# Patient Record
Sex: Female | Born: 1960 | Hispanic: Yes | Marital: Single | Smoking: Former smoker
Health system: Southern US, Community
[De-identification: ages and names within clinical notes are randomized; demographics above are authoritative.]

## PROBLEM LIST (undated history)

## (undated) DIAGNOSIS — I639 Cerebral infarction, unspecified: Secondary | ICD-10-CM

## (undated) DIAGNOSIS — K760 Fatty (change of) liver, not elsewhere classified: Secondary | ICD-10-CM

## (undated) DIAGNOSIS — M069 Rheumatoid arthritis, unspecified: Secondary | ICD-10-CM

## (undated) DIAGNOSIS — R51 Headache: Secondary | ICD-10-CM

## (undated) DIAGNOSIS — G4730 Sleep apnea, unspecified: Secondary | ICD-10-CM

## (undated) DIAGNOSIS — J189 Pneumonia, unspecified organism: Secondary | ICD-10-CM

## (undated) DIAGNOSIS — M1990 Unspecified osteoarthritis, unspecified site: Secondary | ICD-10-CM

## (undated) DIAGNOSIS — K219 Gastro-esophageal reflux disease without esophagitis: Secondary | ICD-10-CM

## (undated) DIAGNOSIS — Z8719 Personal history of other diseases of the digestive system: Secondary | ICD-10-CM

## (undated) DIAGNOSIS — E781 Pure hyperglyceridemia: Secondary | ICD-10-CM

## (undated) DIAGNOSIS — Z9221 Personal history of antineoplastic chemotherapy: Secondary | ICD-10-CM

## (undated) DIAGNOSIS — F419 Anxiety disorder, unspecified: Secondary | ICD-10-CM

## (undated) DIAGNOSIS — C9590 Leukemia, unspecified not having achieved remission: Secondary | ICD-10-CM

## (undated) DIAGNOSIS — R519 Headache, unspecified: Secondary | ICD-10-CM

## (undated) DIAGNOSIS — C9200 Acute myeloblastic leukemia, not having achieved remission: Secondary | ICD-10-CM

## (undated) DIAGNOSIS — F329 Major depressive disorder, single episode, unspecified: Secondary | ICD-10-CM

## (undated) DIAGNOSIS — F32A Depression, unspecified: Secondary | ICD-10-CM

## (undated) DIAGNOSIS — Z8669 Personal history of other diseases of the nervous system and sense organs: Secondary | ICD-10-CM

## (undated) DIAGNOSIS — M8580 Other specified disorders of bone density and structure, unspecified site: Secondary | ICD-10-CM

## (undated) DIAGNOSIS — K5900 Constipation, unspecified: Secondary | ICD-10-CM

## (undated) DIAGNOSIS — E119 Type 2 diabetes mellitus without complications: Secondary | ICD-10-CM

## (undated) DIAGNOSIS — D649 Anemia, unspecified: Secondary | ICD-10-CM

## (undated) HISTORY — DX: Sleep apnea, unspecified: G47.30

## (undated) HISTORY — DX: Acute myeloblastic leukemia, not having achieved remission: C92.00

## (undated) HISTORY — DX: Cerebral infarction, unspecified: I63.9

## (undated) HISTORY — PX: JOINT REPLACEMENT: SHX530

## (undated) HISTORY — DX: Constipation, unspecified: K59.00

## (undated) HISTORY — DX: Rheumatoid arthritis, unspecified: M06.9

## (undated) HISTORY — DX: Leukemia, unspecified not having achieved remission: C95.90

## (undated) HISTORY — DX: Other specified disorders of bone density and structure, unspecified site: M85.80

---

## 1994-07-15 HISTORY — PX: TUBAL LIGATION: SHX77

## 1998-07-15 DIAGNOSIS — Z9884 Bariatric surgery status: Secondary | ICD-10-CM | POA: Insufficient documentation

## 1998-07-15 HISTORY — PX: LAPAROSCOPIC GASTRIC BANDING: SHX1100

## 1999-07-16 HISTORY — PX: ABDOMINAL HYSTERECTOMY: SHX81

## 2005-03-14 ENCOUNTER — Ambulatory Visit: Payer: Self-pay | Admitting: Internal Medicine

## 2005-07-15 DIAGNOSIS — C9201 Acute myeloblastic leukemia, in remission: Secondary | ICD-10-CM | POA: Insufficient documentation

## 2005-07-15 DIAGNOSIS — C9200 Acute myeloblastic leukemia, not having achieved remission: Secondary | ICD-10-CM

## 2005-07-15 HISTORY — DX: Acute myeloblastic leukemia, not having achieved remission: C92.00

## 2006-08-07 ENCOUNTER — Ambulatory Visit: Payer: Self-pay | Admitting: Internal Medicine

## 2006-08-15 ENCOUNTER — Ambulatory Visit: Payer: Self-pay | Admitting: Internal Medicine

## 2010-12-07 DIAGNOSIS — Z8619 Personal history of other infectious and parasitic diseases: Secondary | ICD-10-CM | POA: Insufficient documentation

## 2011-09-30 DIAGNOSIS — J302 Other seasonal allergic rhinitis: Secondary | ICD-10-CM | POA: Insufficient documentation

## 2012-06-10 ENCOUNTER — Ambulatory Visit: Payer: Self-pay | Admitting: Family Medicine

## 2012-07-02 ENCOUNTER — Ambulatory Visit: Payer: Self-pay | Admitting: Family Medicine

## 2012-12-03 ENCOUNTER — Ambulatory Visit: Payer: Self-pay | Admitting: Family Medicine

## 2012-12-10 ENCOUNTER — Ambulatory Visit: Payer: Self-pay | Admitting: Family Medicine

## 2012-12-22 DIAGNOSIS — R590 Localized enlarged lymph nodes: Secondary | ICD-10-CM | POA: Insufficient documentation

## 2013-01-14 ENCOUNTER — Observation Stay: Payer: Self-pay | Admitting: Internal Medicine

## 2013-01-14 LAB — COMPREHENSIVE METABOLIC PANEL
Albumin: 3.8 g/dL (ref 3.4–5.0)
Alkaline Phosphatase: 100 U/L (ref 50–136)
BUN: 11 mg/dL (ref 7–18)
Calcium, Total: 9.2 mg/dL (ref 8.5–10.1)
Co2: 29 mmol/L (ref 21–32)
EGFR (African American): >60
EGFR (Non-African Amer.): >60
SGPT (ALT): 39 U/L (ref 12–78)
Total Protein: 7.7 g/dL (ref 6.4–8.2)

## 2013-01-14 LAB — CBC
HCT: 44.6 % (ref 35.0–47.0)
HGB: 15.2 g/dL (ref 12.0–16.0)
MCHC: 34.2 g/dL (ref 32.0–36.0)
MCV: 95 fL (ref 80–100)
Platelet: 221 10*3/uL (ref 150–440)
RBC: 4.68 10*6/uL (ref 3.80–5.20)

## 2013-01-15 DIAGNOSIS — I6789 Other cerebrovascular disease: Secondary | ICD-10-CM

## 2013-01-15 LAB — CBC WITH DIFFERENTIAL/PLATELET
Basophil #: 0 10*3/uL (ref 0.0–0.1)
Eosinophil %: 3.8 %
HCT: 43.5 % (ref 35.0–47.0)
HGB: 14.8 g/dL (ref 12.0–16.0)
Lymphocyte #: 3.6 10*3/uL (ref 1.0–3.6)
Lymphocyte %: 39.9 %
MCH: 32.6 pg (ref 26.0–34.0)
MCHC: 34 g/dL (ref 32.0–36.0)
MCV: 96 fL (ref 80–100)
Platelet: 202 10*3/uL (ref 150–440)
RBC: 4.54 10*6/uL (ref 3.80–5.20)
RDW: 14.3 % (ref 11.5–14.5)
WBC: 9.1 10*3/uL (ref 3.6–11.0)

## 2013-01-15 LAB — URINALYSIS, COMPLETE
Bilirubin,UR: NEGATIVE
Ketone: NEGATIVE
Nitrite: NEGATIVE
Ph: 5 (ref 4.5–8.0)
Protein: NEGATIVE
RBC,UR: 1 /HPF (ref 0–5)
Specific Gravity: 1.016 (ref 1.003–1.030)
Squamous Epithelial: 10

## 2013-01-15 LAB — LIPID PANEL
Cholesterol: 190 mg/dL (ref 0–200)
HDL Cholesterol: 44 mg/dL (ref 40–60)
Triglycerides: 307 mg/dL — ABNORMAL HIGH (ref 0–200)
VLDL Cholesterol, Calc: 61 mg/dL — ABNORMAL HIGH (ref 5–40)

## 2013-02-15 ENCOUNTER — Ambulatory Visit: Payer: Self-pay | Admitting: Family Medicine

## 2013-03-15 ENCOUNTER — Ambulatory Visit: Payer: Self-pay | Admitting: Family Medicine

## 2013-03-17 ENCOUNTER — Ambulatory Visit: Payer: Self-pay | Admitting: Neurology

## 2013-04-12 ENCOUNTER — Ambulatory Visit: Payer: Self-pay | Admitting: Neurology

## 2013-04-14 ENCOUNTER — Ambulatory Visit: Payer: Self-pay | Admitting: Family Medicine

## 2013-05-15 ENCOUNTER — Ambulatory Visit: Payer: Self-pay | Admitting: Family Medicine

## 2013-06-14 ENCOUNTER — Ambulatory Visit: Payer: Self-pay | Admitting: Family Medicine

## 2013-07-15 ENCOUNTER — Ambulatory Visit: Payer: Self-pay | Admitting: Family Medicine

## 2013-08-19 ENCOUNTER — Ambulatory Visit: Payer: Self-pay | Admitting: Family Medicine

## 2013-12-20 ENCOUNTER — Ambulatory Visit: Payer: Self-pay | Admitting: Family Medicine

## 2014-01-10 ENCOUNTER — Encounter (INDEPENDENT_AMBULATORY_CARE_PROVIDER_SITE_OTHER): Payer: Self-pay | Admitting: Surgery

## 2014-01-10 ENCOUNTER — Ambulatory Visit (INDEPENDENT_AMBULATORY_CARE_PROVIDER_SITE_OTHER): Payer: BC Managed Care – PPO | Admitting: Surgery

## 2014-01-10 VITALS — BP 125/75 | HR 67 | Temp 98.5°F | Resp 14 | Ht 62.0 in | Wt 252.8 lb

## 2014-01-10 DIAGNOSIS — Z6841 Body Mass Index (BMI) 40.0 and over, adult: Secondary | ICD-10-CM | POA: Insufficient documentation

## 2014-01-10 DIAGNOSIS — Z856 Personal history of leukemia: Secondary | ICD-10-CM

## 2014-01-10 DIAGNOSIS — Z9884 Bariatric surgery status: Secondary | ICD-10-CM

## 2014-01-10 DIAGNOSIS — G4733 Obstructive sleep apnea (adult) (pediatric): Secondary | ICD-10-CM

## 2014-01-10 MED ORDER — HYDROCODONE-ACETAMINOPHEN 5-325 MG PO TABS: 1.0000 | ORAL_TABLET | ORAL | Status: DC | PRN

## 2014-01-10 NOTE — Patient Instructions (Signed)
Laparoscopa gstrica y [place] , Cuidados posteriores (Laparoscopic Gastric Band Surgery, Care After)  Estas indicaciones le proporcionan informacin general acerca de cmo deber cuidarse despus del procedimiento. El mdico tambin podr darle ms informacin especfica. Comunquese con su mdico si despus del procedimiento tiene problemas o dudas. CUIDADOS EN EL HOGAR   Haga caminatas con frecuencia Agricultural consultant. No permanezca sentado durante ms de 1 hora mientras est despierto, durante las 4 a 6 semanas posteriores a la [place].  Podr darse Ardelia Mems ducha 2 das despus del procedimiento. [place] heridas (incisiones). No se frote las incisiones.  Haga ejercicios para la tos y Research officer, trade union.  No  levante, empuje o levante objetos pesados hasta que el mdico lo autorice.  Tome slo la medicacin que le indic el profesional. No conduzca vehculos mientras toma analgsicos.  Beba gran cantidad de lquido para mantener la orina de tono claro o color amarillo plido.  Siga una dieta de lquidos durante el tiempo indicado por el mdico.  No consuma cafena por 1 mes.  Cambie todos los apsitos (vendajes) segn las indicaciones del [place] heridas para observar enrojecimiento, inflamacin (hinchazn), color o secrecin anormal, o sangrado.  Siga las recomendaciones de su mdico acerca de los requerimientos de vitaminas y protenas despus de la [place]. SOLICITE AYUDA DE INMEDIATO SI:   Tiene malestar estomacal (nuseas) y vmitos.  Siente dolor y molestias al tragar.  Le falta el aire o tiene dificultad para respirar.  Siente dolor, inflamacin o calor en la zona inferior del cuerpo.  Siente dolor intenso en las pantorrillas o tiene algn dolor que no se calma con los medicamentos.  La temperatura oral le sube a ms de 38,9 C (102 F).  Observa que la incisin est roja, inflamada o segrega lquido.  La materia fecal (heces) son  de color negro o rojo oscuro, o de aspecto similar al alquitrn.  Comienza a sentir escalofros.  Siente dolor en el pecho.  Se siente sbitamente confundido.  Tiene dificultad para hablar.  Se siente mareado al pararse.  Sbitamente se siente dbil.  Tiene preguntas o preocupaciones. ASEGRESE DE QUE:   Comprende estas instrucciones.  Controlar su enfermedad.  Solicitar ayuda de inmediato si no mejora o empeora. Document Released: 10/16/2010 Document Revised: 10/26/2012 [facility] Patient Information 2015 [place], [place]. This information is not intended to replace advice given to you by your health care Gabriele Loveland. Make sure you discuss any questions you have with your health care Chinwe Lope.

## 2014-01-10 NOTE — Progress Notes (Addendum)
Chief Complaint:  Gastric band in [place] in 2000 not filled at present-daugther and her best friend here to help with interpretation.    History of Present Illness:  Stacey Nichols is an 53 y.o. female who had a gastric band done in the anterior [place] in 2000. This was an "[employer]" band and is apparently a [place] band.  About 7 years ago she was diagnosed with leukemia and treated at [facility] he at that time all of the fluid was removed from her band. Since then she's had a lot of problems with arthritis and has been unable to exercise and has gained weight. She saw her primary care doctor, Dr. Keith Rake in [place] who referred her.    On examination her port is in the left upper quadrant. She is having some referred pain in the left upper quadrant which may be been related. As the first [facility] we should get an upper GI series to look at her band position and see if we can use the band to try to help her lose weight. If not then she is inch did and considering a sleeve gastrectomy or Conversion to Roux-en-Y.  Past Medical History  Diagnosis Date  . Cancer     leukemia    Past Surgical History  Procedure Laterality Date  . Laparoscopic gastric banding  2000-2001    performed in [place]  . Tubal ligation  1996  . Abdominal hysterectomy  2001    Current Outpatient Prescriptions  Medication Sig Dispense Refill  . aspirin 81 MG tablet Take 81 mg by mouth daily.      . fexofenadine (ALLEGRA) 30 MG tablet Take 30 mg by mouth 2 (two) times daily.      . montelukast (SINGULAIR) 10 MG tablet       . naproxen sodium (ANAPROX) 550 MG tablet       . topiramate (TOPAMAX) 50 MG tablet        No current facility-administered medications for this visit.   Latex Family History  Problem Relation Age of Onset  . Heart disease Father    Social History:   reports that she quit smoking about a year ago. Her smoking use included Cigarettes. She smoked 0.00 packs per day. She does not have any smokeless  tobacco history on file. She reports that she drinks alcohol. She reports that she does not use illicit drugs.   REVIEW OF SYSTEMS : Positive for arthritic and OSA ; otherwise negative  Physical Exam:   Blood pressure 125/75, pulse 67, temperature 98.5 F (36.9 C), resp. rate 14, height 5\' 2"  (1.575 m), weight 252 lb 12.8 oz (114.669 kg). Body mass index is 46.23 kg/(m^2).  Gen:  WDWN hispanic female NAD  Neurological: Alert and oriented to person, place, and time. Motor and sensory function is grossly intact  Head: Normocephalic and atraumatic.  Eyes: Conjunctivae are normal. Pupils are equal, round, and reactive to light. No scleral icterus.  Neck: Normal range of motion. Neck supple. No tracheal deviation or thyromegaly present.  Cardiovascular:  SR without murmurs or gallops.  No carotid bruits Breast:  Not examined Respiratory: Effort normal.  No respiratory distress. No chest wall tenderness. Breath sounds normal.  No wheezes, rales or rhonchi.  Abdomen:  Obese with multiple port sites GU:  Not evaluated Musculoskeletal: Normal range of motion. Extremities are nontender. No cyanosis, edema or clubbing noted Lymphadenopathy: No cervical, preauricular, postauricular or axillary adenopathy is present Skin: Skin is warm and dry. No  rash noted. No diaphoresis. No erythema. No pallor. Pscyh: Normal mood and affect. Behavior is normal. Judgment and thought content normal.   LABORATORY RESULTS: No results found for this or any previous visit (from the past 48 hour(s)).   RADIOLOGY RESULTS: No results found.  Problem List: Patient Active Problem List   Diagnosis Date Noted  . History of leukemia 01/10/2014  . 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"  01/10/2014  . Morbid obesity-BMI 46 01/10/2014  . OSA (obstructive sleep apnea) 01/10/2014  . Arthritis-knees and ankles 01/10/2014    Assessment & Plan: Gastric band placed in [place] that is not filled by history.  Will get UGI to  assess position and restriction.  Will make recommendations from that study.      Matt B. Hassell Done, MD, [facility] [phone] beeper [phone]  01/10/2014 4:39 PM    Requested something for pain.  Norco prescribed.

## 2014-01-10 NOTE — Addendum Note (Signed)
Addended by: Johnathan Hausen B on: 01/10/2014 04:58 PM   Modules accepted: Orders

## 2014-01-17 ENCOUNTER — Ambulatory Visit
Admission: RE | Admit: 2014-01-17 | Discharge: 2014-01-17 | Disposition: A | Discharge status: Home / Self Care | Payer: BC Managed Care – PPO | Source: Ambulatory Visit | Attending: Surgery | Admitting: Surgery

## 2014-01-17 DIAGNOSIS — Z9884 Bariatric surgery status: Secondary | ICD-10-CM

## 2014-01-17 DIAGNOSIS — Z856 Personal history of leukemia: Secondary | ICD-10-CM

## 2014-01-20 ENCOUNTER — Encounter (INDEPENDENT_AMBULATORY_CARE_PROVIDER_SITE_OTHER): Payer: Self-pay | Admitting: Surgery

## 2014-01-20 ENCOUNTER — Ambulatory Visit (INDEPENDENT_AMBULATORY_CARE_PROVIDER_SITE_OTHER): Payer: BC Managed Care – PPO | Admitting: Surgery

## 2014-01-20 VITALS — BP 100/60 | HR 74 | Resp 16 | Ht 62.0 in | Wt 252.2 lb

## 2014-01-20 DIAGNOSIS — Z9884 Bariatric surgery status: Secondary | ICD-10-CM

## 2014-01-20 NOTE — Progress Notes (Signed)
Chief Complaint:  Weight regain after [place] band  History of Present Illness:  Stacey Nichols is an 53 y.o. female who underwent a Swedish band placement down in [place].  Despite working really hard with exercise and never achieved great weight loss. Subsequently she was diagnosed and treated successfully for leukemia. Since then issues with arthritis in her knees has limited her activity and she has never been able to have successful weight loss. I reviewed his CT scan from [facility] and the upper GI that order. It appears that her tubing has broken her band is detached. It is very difficult to see and I was only able to perceive the band on her CT scan.    After extensive discussions with her, her daughter, and her best friend who act as her translator we discussed in detail were move all of the band and conversion to a sleeve. I even offered to reconnect her band and see if we could have weight loss with that but said she tried very hard but that she had suboptimal results. She would like to have conversion to a sleeve gastrectomy. I described to doing this in either one operation or in 2 operations depending on the findings at the time of the removal of her band. She also has some issues with reflux and may have a hiatal hernia that will need to be repaired at the same time.  Past Medical History  Diagnosis Date  . Cancer     leukemia    Past Surgical History  Procedure Laterality Date  . Laparoscopic gastric banding  2000-2001    performed in [place]  . Tubal ligation  1996  . Abdominal hysterectomy  2001    Current Outpatient Prescriptions  Medication Sig Dispense Refill  . aspirin 81 MG tablet Take 81 mg by mouth daily.      . fexofenadine (ALLEGRA) 30 MG tablet Take 30 mg by mouth 2 (two) times daily.      Marland Kitchen HYDROcodone-acetaminophen (NORCO) 5-325 MG per tablet Take 1 tablet by mouth every 4 (four) hours as needed for moderate pain.  30 tablet  0  . montelukast (SINGULAIR)  10 MG tablet       . naproxen sodium (ANAPROX) 550 MG tablet       . topiramate (TOPAMAX) 50 MG tablet        No current facility-administered medications for this visit.   Latex Family History  Problem Relation Age of Onset  . Heart disease Father    Social History:   reports that she quit smoking about a year ago. Her smoking use included Cigarettes. She smoked 0.00 packs per Stacey. She does not have any smokeless tobacco history on file. She reports that she drinks alcohol. She reports that she does not use illicit drugs.   REVIEW OF SYSTEMS : Positive for heartburn ; otherwise negative  Physical Exam:   Blood pressure 100/60, pulse 74, resp. rate 16, height 5\' 2"  (1.575 m), weight 252 lb 3.2 oz (114.397 kg). Body mass index is 46.12 kg/(m^2).  Gen:  WDWN white female NAD  Neurological: Alert and oriented to person, place, and time. Motor and sensory function is grossly intact  Head: Normocephalic and atraumatic.  Eyes: Conjunctivae are normal. Pupils are equal, round, and reactive to light. No scleral icterus.  Neck: Normal range of motion. Neck supple. No tracheal deviation or thyromegaly present.  Cardiovascular:  SR without murmurs or gallops.  No carotid bruits Breast:  Not examined Respiratory:  Effort normal.  No respiratory distress. No chest wall tenderness. Breath sounds normal.  No wheezes, rales or rhonchi.  Abdomen:  Obese difficult to palpate the port in the left upper quadrant GU:  Not examined Musculoskeletal: Normal range of motion. Extremities are nontender. No cyanosis, edema or clubbing noted Lymphadenopathy: No cervical, preauricular, postauricular or axillary adenopathy is present Skin: Skin is warm and dry. No rash noted. No diaphoresis. No erythema. No pallor. Pscyh: Normal mood and affect. Behavior is normal. Judgment and thought content normal.   LABORATORY RESULTS: No results found for this or any previous visit (from the past 48 hour(s)).   RADIOLOGY  RESULTS: No results found.  Problem List: Patient Active Problem List   Diagnosis Date Noted  . History of leukemia 01/10/2014  . 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"  01/10/2014  . Morbid obesity-BMI 46 01/10/2014  . OSA (obstructive sleep apnea) 01/10/2014  . Arthritis-knees and ankles 01/10/2014    Assessment & Plan: History of band placed in [place] with now detached tubing and nonfunction but with a history of suboptimal weight loss. She desires a conversion to a sleeve gastrectomy.    Matt B. Hassell Done, MD, [facility] [phone] beeper [phone]  01/20/2014 2:23 PM

## 2014-01-20 NOTE — Addendum Note (Signed)
Addended by: Carlene Coria on: 01/20/2014 02:37 PM   Modules accepted: Orders

## 2014-01-20 NOTE — Patient Instructions (Signed)
Gastrectoma en manga  (Sleeve Gastrectomy) La gastrectoma en manga es una ciruga en la que se extirpa una porcin grande del [place]. Despus de la [place], el estmago ser un tubo estrecho del tamao aproximado de una banana. Esta ciruga se realiza para ayudar a la persona a Administrator, Civil Service. La persona pierde peso debido a que el tamao reducido del Paramedic restringe la cantidad de alimento que la persona puede comer. El Paramedic contendr una cantidad mucho menor de alimentos que la que poda contener antes de la [place]. Adems, la parte del estmago que se extirpa produce una hormona que causa hambre.  Esta ciruga se indica en las personas con obesidad mrbida, definida como el ndice de masa corporal [facility]) mayor a 40. El [facility] es la estimacin de la grasa corporal y se calcula a partir de la altura y el peso de una persona. Esta ciruga tambin est indicada en personas con un [facility] entre 12 y 13, si adems sufren otras enfermedades como diabetes mellitus tipo 2, apnea del sueo obstructiva o afecciones cardacas o pulmonares. (enfermedades cardiopulmonares).  INFORME A SU MDICO:   Cualquier alergia que tenga.   Todos los [employer] [place], incluyendo vitaminas, hierbas, gotas oftlmicas, cremas y medicamentos de venta libre.   Uso de corticoides (por va oral o cremas).   Problemas previos que usted o los [facility] de su familia hayan tenido con el uso de anestsicos.   Enfermedades de [employer].   Cirugas previas.   Posibilidad de embarazo, si correspondiera.   Otros problemas de [place]. RIESGOS Y COMPLICACIONES  En general, la gastrectoma en manga es un procedimiento seguro. Sin embargo, Games developer procedimiento, pueden surgir complicaciones. Las complicaciones posibles son:   Infeccin.  Hemorragias.  Cogulos sanguneos.  Lesiones en los rganos o tejidos circundantes.  Prdida de lquido del estmago hacia la cavidad abdominal (raro). ANTES DEL  PROCEDIMIENTO   Es posible que tenga que hacerse anlisis de [place], [place] de diagnstico por imgenes (como una radiografa o Magazine features editor) antes de la ciruga. Tambin es posible que le indiquen un estudio para Radio broadcast assistant esfago y como se mueve (manometra esofgica).  Le indicarn que siga una dieta lquida durante 2 a 3 semanas antes de la [place].  Consulte a su mdico si debe cambiar o suspender los medicamentos que toma habitualmente.  No coma ni beba nada durante al menos 8 horas antes del procedimiento   Haga arreglos para que alguien lo lleve a su casa despus la hospitalizacin. Tambin pdale a alguna persona que lo ayude con sus actividades mientras se recupera. PROCEDIMIENTO  Generalmente se utiliza la tcnica laparoscpica para este procedimiento:   Le administrarn medicamentos para hacerlo dormir durante el procedimiento (anestesia general). Este medicamento se aplica a travs de una va intravenosa (IV) que se coloca en una vena.  Cuando est dormido, le higienizarn y desinfectarn el abdomen.  Le practicarn varias incisiones pequeas en el abdomen.  El gran espacio dentro del abdomen se llena de aire para expandirlo. Esto le proporciona al cirujano mayor espacio para operar y le permite visualizar ms fcil sus rganos.  Se inserta un tubo delgado que ilumina y que tiene una pequea cmara en el extremo (laparoscopio) a Lawerance Cruel de una pequea incisin en el abdomen. La cmara del laparoscopio enva una imagen a una pantalla de televisin que se encuentra en el quirfano. De este modo, el mdico tendr Ardelia Mems buena visin del interior del abdomen.  A travs de las otras pequeas incisiones del abdomen  se insertan tubos huecos. A travs de estos tubos se coloca el instrumental necesario para los procedimientos.  El cirujano [place] grapas para dividir parte del estmago y [employer] extirpa a travs de una de las incisiones.  La parte del estmago que queda se refuerza con  puntos o pegamento quirrgico, o ambos, para Product/process development scientist prdidas de los contenidos del [place]. A travs de una de las incisiones se coloca un pequeo tubo (drenaje) para permitir que el lquido en exceso drene de la zona.  Las incisiones se cierran con puntos de sutura o grapas o pegamento. DESPUS DEL PROCEDIMIENTO   Ser controlado rigurosamente en el rea de recuperacin. Una vez que haya eliminado la anestesia, es probable que lo lleven a una habitacin comn del hospital.  Marin Comment administrarn medicamentos para Best boy y las nuseas.   Podr tener un drenaje en una de las incisiones del abdomen. Si se [place] un drenaje, lo dejarn en el lugar hasta que vuelva a su casa y lo quitarn en la prxima visita.   Lo estimularn a que camine varias veces por da. Esto ayuda a prevenir cogulos sanguneos.  Le indicarn que comience con una dieta lquida al da siguiente de la [place]. En algunos casos se indica un estudio para controlar que no haya prdidas antes de que pueda comer.  Deber hacer ejercicios de respiracin y [employer] que tendr que toser. Esto ayuda a prevenir las infecciones pulmonares despus de [place].  Ser necesario que Music therapist hospital [place].  Document Released: 03/03/2013 [facility] Patient Information 2015 [place]. This information is not intended to replace advice given to you by your health care provider. Make sure you discuss any questions you have with your health care provider. Sleeve Gastrectomy, Care After Refer to this sheet in the next few weeks. These instructions provide you with information on caring for yourself after your procedure. Your surgeon may also give you more specific instructions. Your treatment has been planned according to current medical practices, but problems sometimes occur. Call your surgeon if you have any problems or questions after your procedure. HOME CARE INSTRUCTIONS  Get plenty of rest, but  move around frequently for short periods or take short walks as directed by your surgeon. Increase your activities gradually.  Only take over-the-counter or prescription medicines as directed by your surgeon.  Keep incision areas clean and dry. Remove or change any bandages (dressings) only as directed by your surgeon. You may have skin adhesive strips or glue over the incision areas. Do not take the strips or the glue off. They will fall off on their own.  Check your incisions and surrounding areas daily for any redness, swelling, or drainage of fluid.  Take showers once your surgeon approves. Until then, only take sponge baths. Pat incisions dry. Do not rub incisions with a washcloth or towel. Do not take tub baths or go swimming until your surgeon approves. Do not put anything on your incision to clean it unless directed to do so by your surgeon.  Limit activities as directed by your surgeon. You will need to avoid strenuous activity, heavy lifting, and pushing or pulling things with your arms for several weeks. Do not lift anything heavier than 10 lb (4.5 kg).  Perform deep breathing exercises and coughing as directed by your surgeon. This helps prevent a lung infection.  Do not drive until your surgeon approves.  Follow all of the dietary instructions provided by your surgeon or  dietitian. You will receive specific instructions on the type, size, and timing of meals.      You may need to stay on a liquid diet for some time after the surgery.  Drink fluids frequently. You should drink 1 oz of fluid as often as you can.  Take vitamin, calcium, and protein supplements as directed by your surgeon.  If you have a drain from the incision area, make sure you:      Keep the area of the drain clean and dry.  Empty the drain and record the amount of fluid daily.  Talk with your surgeon about when you may return to work and about your exercise routine.   Keep all follow-up  appointments with your surgeon and dietitian. SEEK MEDICAL CARE IF:  Your pain is not controlled with medicine.  You have a fever.  You have shaking chills.  You notice any redness, skin irritation, swelling, or drainage of fluid (other than light red) in the incision area.  Your drain gets pulled out accidentally.   Your drain contains bright red blood, green fluid, or fluid that has a foul smell. SEEK IMMEDIATE MEDICAL CARE IF:  You have difficulty breathing.  You have severe calf pain. MAKE SURE YOU:  Understand these instructions.  Will watch your condition.  Will get help right away if you are not doing well or get worse. Document Released: 04/27/2009 Document Revised: 03/03/2013 Document Reviewed: 11/13/2012 [facility] Patient Information 2015 [place], [place]. This information is not intended to replace advice given to you by your health care provider. Make sure you discuss any questions you have with your health care provider.

## 2014-02-08 ENCOUNTER — Ambulatory Visit (HOSPITAL_COMMUNITY): Payer: BC Managed Care – PPO

## 2014-02-08 ENCOUNTER — Other Ambulatory Visit (HOSPITAL_COMMUNITY): Payer: BC Managed Care – PPO

## 2014-02-16 ENCOUNTER — Ambulatory Visit (HOSPITAL_COMMUNITY)
Admission: RE | Admit: 2014-02-16 | Discharge: 2014-02-16 | Disposition: A | Discharge status: Home / Self Care | Payer: BC Managed Care – PPO | Source: Ambulatory Visit | Attending: Surgery | Admitting: Surgery

## 2014-02-16 DIAGNOSIS — Z9884 Bariatric surgery status: Secondary | ICD-10-CM

## 2014-02-16 DIAGNOSIS — C9590 Leukemia, unspecified not having achieved remission: Secondary | ICD-10-CM | POA: Insufficient documentation

## 2014-02-16 DIAGNOSIS — M1710 Unilateral primary osteoarthritis, unspecified knee: Secondary | ICD-10-CM | POA: Insufficient documentation

## 2014-02-16 DIAGNOSIS — IMO0002 Reserved for concepts with insufficient information to code with codable children: Secondary | ICD-10-CM

## 2014-02-16 DIAGNOSIS — G4733 Obstructive sleep apnea (adult) (pediatric): Secondary | ICD-10-CM | POA: Insufficient documentation

## 2014-02-16 DIAGNOSIS — Z6841 Body Mass Index (BMI) 40.0 and over, adult: Secondary | ICD-10-CM | POA: Insufficient documentation

## 2014-03-01 ENCOUNTER — Encounter: Payer: BC Managed Care – PPO | Attending: Surgery | Admitting: Dietician

## 2014-03-01 ENCOUNTER — Encounter: Payer: Self-pay | Admitting: Dietician

## 2014-03-01 DIAGNOSIS — Z01818 Encounter for other preprocedural examination: Secondary | ICD-10-CM | POA: Diagnosis present

## 2014-03-01 DIAGNOSIS — Z6841 Body Mass Index (BMI) 40.0 and over, adult: Secondary | ICD-10-CM | POA: Insufficient documentation

## 2014-03-01 DIAGNOSIS — Z713 Dietary counseling and surveillance: Secondary | ICD-10-CM | POA: Diagnosis not present

## 2014-03-01 LAB — CBC
HEMATOCRIT: 45.7 % (ref 36.0–46.0)
Hemoglobin: 15.5 g/dL — ABNORMAL HIGH (ref 12.0–15.0)
MCH: 31.8 pg (ref 26.0–34.0)
MCHC: 33.9 g/dL (ref 30.0–36.0)
MCV: 93.6 fL (ref 78.0–100.0)
Platelets: 221 10*3/uL (ref 150–400)
RBC: 4.88 MIL/uL (ref 3.87–5.11)
RDW: 14.5 % (ref 11.5–15.5)
WBC: 7.6 10*3/uL (ref 4.0–10.5)

## 2014-03-01 LAB — COMPREHENSIVE METABOLIC PANEL
ALBUMIN: 4.4 g/dL (ref 3.5–5.2)
ALK PHOS: 84 U/L (ref 39–117)
ALT: 37 U/L — ABNORMAL HIGH (ref 0–35)
AST: 37 U/L (ref 0–37)
BUN: 16 mg/dL (ref 6–23)
CO2: 20 meq/L (ref 19–32)
Calcium: 9.3 mg/dL (ref 8.4–10.5)
Chloride: 106 mEq/L (ref 96–112)
Creat: 0.85 mg/dL (ref 0.50–1.10)
GLUCOSE: 97 mg/dL (ref 70–99)
POTASSIUM: 4.5 meq/L (ref 3.5–5.3)
Sodium: 139 mEq/L (ref 135–145)
Total Bilirubin: 0.4 mg/dL (ref 0.2–1.2)
Total Protein: 7.4 g/dL (ref 6.0–8.3)

## 2014-03-01 LAB — TSH: TSH: 3.163 u[IU]/mL (ref 0.350–4.500)

## 2014-03-01 LAB — HEMOGLOBIN A1C
Hgb A1c MFr Bld: 6.2 % — ABNORMAL HIGH (ref <5.7)
MEAN PLASMA GLUCOSE: 131 mg/dL — AB (ref <117)

## 2014-03-01 LAB — T4: T4 TOTAL: 7.5 ug/dL (ref 5.0–12.5)

## 2014-03-01 NOTE — Progress Notes (Signed)
  Pre-Op Assessment Visit:  Pre-Operative Sleeve Gastrectomy Surgery  Medical Nutrition Therapy:  Appt start time: 8338   End time:  1130.  Patient was seen on 03/01/2014 for Pre-Operative Sleeve Gastrectomy Nutrition Assessment. Assessment and letter of approval faxed to [facility] Bariatric Surgery Program coordinator on 03/01/2014.   Preferred Learning Style:   No preference indicated   Learning Readiness:   Ready  Handouts given during visit include:  Pre-Op Goals Bariatric Surgery Protein Shakes Yellow card (carbohydrate foods)  Teaching Method Utilized:  Visual Auditory Hands on  Barriers to learning/adherence to lifestyle change: none  Demonstrated degree of understanding via:  Teach Back   Patient to call the [facility] to enroll in Pre-Op and Post-Op Nutrition Education when surgery date is scheduled.

## 2014-03-01 NOTE — Patient Instructions (Signed)
Follow Pre-Op Goals Try Protein Shakes Call [facility] at [phone] when surgery is scheduled to have Pre-Op appointments.

## 2014-03-30 ENCOUNTER — Other Ambulatory Visit (INDEPENDENT_AMBULATORY_CARE_PROVIDER_SITE_OTHER): Payer: Self-pay | Admitting: Surgery

## 2014-03-30 MED ORDER — HYDROCODONE-ACETAMINOPHEN 5-325 MG PO TABS: 1.0000 | ORAL_TABLET | ORAL | Status: DC | PRN

## 2014-04-07 ENCOUNTER — Encounter: Payer: BC Managed Care – PPO | Attending: Surgery | Admitting: Dietician

## 2014-04-07 DIAGNOSIS — Z01818 Encounter for other preprocedural examination: Secondary | ICD-10-CM | POA: Insufficient documentation

## 2014-04-07 DIAGNOSIS — Z6841 Body Mass Index (BMI) 40.0 and over, adult: Secondary | ICD-10-CM | POA: Diagnosis not present

## 2014-04-07 DIAGNOSIS — Z713 Dietary counseling and surveillance: Secondary | ICD-10-CM | POA: Insufficient documentation

## 2014-04-07 NOTE — Progress Notes (Signed)
  Pre-Operative Nutrition Class:  Appt start time: 1050   End time:  1145  Patient was seen on 04/07/2014 for one-on-one Pre-Operative Bariatric Surgery Education at the [facility]. The session was translated by a [place] Language interpreter.  Surgery date: Gastric sleeve Surgery type: 04/26/2014 [facility] at [facility]: 255.5 lbs on 03/01/14 Weight today: 262 lbs  TANITA  BODY COMP RESULTS  04/07/14   BMI (kg/m^2) 47.9   Fat Mass (lbs) 132.5   Fat Free Mass (lbs) 129.5   Total Body Water (lbs) 95   Samples given per MNT protocol. Patient educated on appropriate usage: Premier protein shake (vanilla - qty 1) Lot #: [id number] Exp: 11/2014  Unjury protein powder (unflavored - qty 1) Lot #: [id number] Exp: 05/2015  Celebrate Vitamins Multivitamin (grape - qty 1) Lot #: [id number] Exp: 07/2014   The following the learning objectives were met by the patient during this course:  Identify Pre-Op Dietary Goals and will begin 2 weeks pre-operatively  Identify appropriate sources of fluids and proteins   State protein recommendations and appropriate sources pre and post-operatively  Identify Post-Operative Dietary Goals and will follow for 2 weeks post-operatively  Identify appropriate multivitamin and calcium sources  Describe the need for physical activity post-operatively and will follow MD recommendations  State when to call healthcare provider regarding medication questions or post-operative complications  Handouts given during class include:  Pre-Op Bariatric Surgery Diet Handout  Protein Shake Handout  Post-Op Bariatric Surgery Nutrition Handout  BELT Program Information Flyer  Support Group Information Flyer  [facility] Bariatric Supplements Price List  Follow-Up Plan: Patient will follow-up at [facility] 2 weeks post operatively for diet advancement per MD.

## 2014-04-11 ENCOUNTER — Ambulatory Visit: Payer: BC Managed Care – PPO

## 2014-04-13 ENCOUNTER — Encounter (HOSPITAL_COMMUNITY): Payer: Self-pay | Admitting: Pharmacy Technician

## 2014-04-13 NOTE — Progress Notes (Signed)
Please put orders in Epic surgery 04-26-14 pre op 04-19-14 Thanks

## 2014-04-18 NOTE — Progress Notes (Signed)
Need orders in EPIC.  Surgery on 04/26/14.  Proep on 04/19/2014 at 1100am.  Thank You.

## 2014-04-18 NOTE — Patient Instructions (Addendum)
Stacey Nichols  04/18/2014   Your procedure is scheduled on:  04/26/14    Report to [facility] Emergency Department.  Follow the Signs to [place] at  0730      am  Call this number if you have problems the morning of surgery: [phone]   Remember: please bring CPAP mask and tubing   Do not eat food or drink liquids after midnight.   Take these medicines the morning of surgery with A SIP OF WATER: allegra and singulair if needed, flonase and nasonex if needed, hydrocodone if needed   Do not wear jewelry, make-up or nail polish.  Do not wear lotions, powders, or perfumes. deodorant.  Do not shave 48 hours prior to surgery.   Do not bring valuables to the hospital.  Contacts, dentures or bridgework may not be worn into surgery.  Leave suitcase in the car. After surgery it may be brought to your room.  For patients admitted to the hospital, checkout time is 11:00 AM the day of discharge.    Before surgery, you can play an important role.  Because skin is not sterile, your skin needs to be as free of germs as possible.  You can reduce the number of germs on your skin by washing with CHG (chlorahexidine gluconate) soap before surgery.  CHG is an antiseptic cleaner which kills germs and bonds with the skin to continue killing germs even after washing. Please DO NOT use if you have an allergy to CHG or antibacterial soaps.  If your skin becomes reddened/irritated stop using the CHG and inform your nurse when you arrive at Short Stay. Do not shave (including legs and underarms) for at least 48 hours prior to the first CHG shower.  You may shave your face/neck. Please follow these instructions carefully:  1.  Shower with CHG Soap the night before surgery and the  morning of Surgery.  2.  If you choose to wash your hair, wash your hair first as usual with your  normal  shampoo.  3.  After you shampoo, rinse your hair and body thoroughly to remove the  shampoo.                            4.   Use CHG as you would any other liquid soap.  You can apply chg directly  to the skin and wash                       Gently with a scrungie or clean washcloth.  5.  Apply the CHG Soap to your body ONLY FROM THE NECK DOWN.   Do not use on face/ open                           Wound or open sores. Avoid contact with eyes, ears mouth and genitals (private parts).                       Wash face,  Genitals (private parts) with your normal soap.             6.  Wash thoroughly, paying special attention to the area where your surgery  will be performed.  7.  Thoroughly rinse your body with warm water from the neck down.  8.  DO NOT shower/wash with your normal soap after using and rinsing off  the CHG Soap.                9.  Pat yourself dry with a clean towel.            10.  Wear clean pajamas.            11.  Place clean sheets on your bed the night of your first shower and do not  sleep with pets. Day of Surgery : Do not apply any lotions/deodorants the morning of surgery.  Please wear clean clothes to the hospital/surgery center.  FAILURE TO FOLLOW THESE INSTRUCTIONS MAY RESULT IN THE CANCELLATION OF YOUR SURGERY PATIENT SIGNATURE_________________________________  NURSE SIGNATURE__________________________________  ________________________________________________________________________

## 2014-04-19 ENCOUNTER — Encounter (HOSPITAL_COMMUNITY)
Admission: RE | Admit: 2014-04-19 | Discharge: 2014-04-19 | Disposition: A | Discharge status: Home / Self Care | Payer: BC Managed Care – PPO | Source: Ambulatory Visit | Attending: Surgery | Admitting: Surgery

## 2014-04-19 ENCOUNTER — Encounter (HOSPITAL_COMMUNITY): Payer: Self-pay

## 2014-04-19 DIAGNOSIS — E781 Pure hyperglyceridemia: Secondary | ICD-10-CM | POA: Diagnosis not present

## 2014-04-19 DIAGNOSIS — Z8673 Personal history of transient ischemic attack (TIA), and cerebral infarction without residual deficits: Secondary | ICD-10-CM | POA: Diagnosis not present

## 2014-04-19 DIAGNOSIS — M1389 Other specified arthritis, multiple sites: Secondary | ICD-10-CM | POA: Diagnosis not present

## 2014-04-19 DIAGNOSIS — G4733 Obstructive sleep apnea (adult) (pediatric): Secondary | ICD-10-CM | POA: Diagnosis not present

## 2014-04-19 DIAGNOSIS — F329 Major depressive disorder, single episode, unspecified: Secondary | ICD-10-CM | POA: Diagnosis not present

## 2014-04-19 DIAGNOSIS — F419 Anxiety disorder, unspecified: Secondary | ICD-10-CM | POA: Diagnosis not present

## 2014-04-19 DIAGNOSIS — Z0000 Encounter for general adult medical examination without abnormal findings: Secondary | ICD-10-CM | POA: Insufficient documentation

## 2014-04-19 DIAGNOSIS — K219 Gastro-esophageal reflux disease without esophagitis: Secondary | ICD-10-CM | POA: Insufficient documentation

## 2014-04-19 DIAGNOSIS — Z91040 Latex allergy status: Secondary | ICD-10-CM | POA: Diagnosis not present

## 2014-04-19 DIAGNOSIS — Z6841 Body Mass Index (BMI) 40.0 and over, adult: Secondary | ICD-10-CM | POA: Insufficient documentation

## 2014-04-19 DIAGNOSIS — C9590 Leukemia, unspecified not having achieved remission: Secondary | ICD-10-CM | POA: Diagnosis not present

## 2014-04-19 HISTORY — DX: Anxiety disorder, unspecified: F41.9

## 2014-04-19 HISTORY — DX: Anemia, unspecified: D64.9

## 2014-04-19 HISTORY — DX: Personal history of other diseases of the digestive system: Z87.19

## 2014-04-19 HISTORY — DX: Personal history of antineoplastic chemotherapy: Z92.21

## 2014-04-19 HISTORY — DX: Unspecified osteoarthritis, unspecified site: M19.90

## 2014-04-19 HISTORY — DX: Personal history of other diseases of the nervous system and sense organs: Z86.69

## 2014-04-19 HISTORY — DX: Pure hyperglyceridemia: E78.1

## 2014-04-19 HISTORY — DX: Gastro-esophageal reflux disease without esophagitis: K21.9

## 2014-04-19 HISTORY — DX: Headache, unspecified: R51.9

## 2014-04-19 HISTORY — DX: Depression, unspecified: F32.A

## 2014-04-19 HISTORY — DX: Major depressive disorder, single episode, unspecified: F32.9

## 2014-04-19 HISTORY — DX: Pneumonia, unspecified organism: J18.9

## 2014-04-19 HISTORY — DX: Headache: R51

## 2014-04-19 LAB — CBC
HCT: 45.3 % (ref 36.0–46.0)
HEMOGLOBIN: 14.8 g/dL (ref 12.0–15.0)
MCH: 31.9 pg (ref 26.0–34.0)
MCHC: 32.7 g/dL (ref 30.0–36.0)
MCV: 97.6 fL (ref 78.0–100.0)
Platelets: 209 10*3/uL (ref 150–400)
RBC: 4.64 MIL/uL (ref 3.87–5.11)
RDW: 13.8 % (ref 11.5–15.5)
WBC: 7.2 10*3/uL (ref 4.0–10.5)

## 2014-04-19 LAB — BASIC METABOLIC PANEL
Anion gap: 12 (ref 5–15)
BUN: 18 mg/dL (ref 6–23)
CO2: 26 mEq/L (ref 19–32)
Calcium: 9.8 mg/dL (ref 8.4–10.5)
Chloride: 102 mEq/L (ref 96–112)
Creatinine, Ser: 0.81 mg/dL (ref 0.50–1.10)
GFR calc Af Amer: >90 mL/min (ref >90)
GFR, EST NON AFRICAN AMERICAN: 82 mL/min — AB (ref >90)
GLUCOSE: 103 mg/dL — AB (ref 70–99)
Potassium: 4.4 mEq/L (ref 3.7–5.3)
Sodium: 140 mEq/L (ref 137–147)

## 2014-04-19 NOTE — Progress Notes (Signed)
Chest x-ray 02/16/14 on EPIC, EKG 02/16/14 on EPIC

## 2014-04-25 NOTE — Progress Notes (Signed)
Called Triage desk at [facility] to request orders for patient's 04/26/14 surgery.

## 2014-04-26 ENCOUNTER — Ambulatory Visit (INDEPENDENT_AMBULATORY_CARE_PROVIDER_SITE_OTHER): Payer: Self-pay | Admitting: Surgery

## 2014-04-26 ENCOUNTER — Inpatient Hospital Stay (HOSPITAL_COMMUNITY)
Admission: RE | Admit: 2014-04-26 | Discharge: 2014-04-29 | DRG: 620 | Disposition: A | Discharge status: Home / Self Care | Payer: BC Managed Care – PPO | Source: Ambulatory Visit | Attending: Surgery | Admitting: Surgery

## 2014-04-26 ENCOUNTER — Encounter (HOSPITAL_COMMUNITY): Payer: Self-pay | Admitting: *Deleted

## 2014-04-26 ENCOUNTER — Inpatient Hospital Stay (HOSPITAL_COMMUNITY): Payer: BC Managed Care – PPO | Admitting: Anesthesiology

## 2014-04-26 ENCOUNTER — Encounter (HOSPITAL_COMMUNITY): Payer: BC Managed Care – PPO | Admitting: Anesthesiology

## 2014-04-26 ENCOUNTER — Encounter (HOSPITAL_COMMUNITY)
Admission: RE | Disposition: A | Discharge status: Home / Self Care | Payer: Self-pay | Source: Ambulatory Visit | Attending: Surgery

## 2014-04-26 DIAGNOSIS — Z90710 Acquired absence of both cervix and uterus: Secondary | ICD-10-CM

## 2014-04-26 DIAGNOSIS — Z79891 Long term (current) use of opiate analgesic: Secondary | ICD-10-CM

## 2014-04-26 DIAGNOSIS — Z791 Long term (current) use of non-steroidal anti-inflammatories (NSAID): Secondary | ICD-10-CM | POA: Diagnosis not present

## 2014-04-26 DIAGNOSIS — G4733 Obstructive sleep apnea (adult) (pediatric): Secondary | ICD-10-CM | POA: Diagnosis present

## 2014-04-26 DIAGNOSIS — K9509 Other complications of gastric band procedure: Secondary | ICD-10-CM | POA: Diagnosis present

## 2014-04-26 DIAGNOSIS — Z6841 Body Mass Index (BMI) 40.0 and over, adult: Secondary | ICD-10-CM | POA: Diagnosis not present

## 2014-04-26 DIAGNOSIS — Z9884 Bariatric surgery status: Secondary | ICD-10-CM

## 2014-04-26 DIAGNOSIS — Z7982 Long term (current) use of aspirin: Secondary | ICD-10-CM

## 2014-04-26 DIAGNOSIS — Z87891 Personal history of nicotine dependence: Secondary | ICD-10-CM

## 2014-04-26 DIAGNOSIS — Z8249 Family history of ischemic heart disease and other diseases of the circulatory system: Secondary | ICD-10-CM | POA: Diagnosis not present

## 2014-04-26 DIAGNOSIS — Z01812 Encounter for preprocedural laboratory examination: Secondary | ICD-10-CM | POA: Diagnosis not present

## 2014-04-26 DIAGNOSIS — Z856 Personal history of leukemia: Secondary | ICD-10-CM | POA: Diagnosis not present

## 2014-04-26 DIAGNOSIS — K219 Gastro-esophageal reflux disease without esophagitis: Secondary | ICD-10-CM | POA: Diagnosis present

## 2014-04-26 DIAGNOSIS — M170 Bilateral primary osteoarthritis of knee: Secondary | ICD-10-CM | POA: Diagnosis present

## 2014-04-26 DIAGNOSIS — Y848 Other medical procedures as the cause of abnormal reaction of the patient, or of later complication, without mention of misadventure at the time of the procedure: Secondary | ICD-10-CM | POA: Diagnosis present

## 2014-04-26 DIAGNOSIS — Z79899 Other long term (current) drug therapy: Secondary | ICD-10-CM | POA: Diagnosis not present

## 2014-04-26 HISTORY — PX: UPPER GI ENDOSCOPY: SHX6162

## 2014-04-26 HISTORY — PX: LAPAROSCOPIC GASTRIC BAND REMOVAL WITH LAPAROSCOPIC GASTRIC SLEEVE RESECTION: SHX6498

## 2014-04-26 LAB — CBC
HEMATOCRIT: 40.6 % (ref 36.0–46.0)
HEMOGLOBIN: 13.7 g/dL (ref 12.0–15.0)
MCH: 32.5 pg (ref 26.0–34.0)
MCHC: 33.7 g/dL (ref 30.0–36.0)
MCV: 96.2 fL (ref 78.0–100.0)
Platelets: 170 10*3/uL (ref 150–400)
RBC: 4.22 MIL/uL (ref 3.87–5.11)
RDW: 13.7 % (ref 11.5–15.5)
WBC: 12.6 10*3/uL — ABNORMAL HIGH (ref 4.0–10.5)

## 2014-04-26 LAB — CREATININE, SERUM
Creatinine, Ser: 0.69 mg/dL (ref 0.50–1.10)
GFR calc Af Amer: >90 mL/min (ref >90)
GFR calc non Af Amer: >90 mL/min (ref >90)

## 2014-04-26 LAB — HEMOGLOBIN AND HEMATOCRIT, BLOOD
HCT: 39.7 % (ref 36.0–46.0)
Hemoglobin: 13.5 g/dL (ref 12.0–15.0)

## 2014-04-26 SURGERY — LAPAROSCOPIC GASTRIC BAND REMOVAL WITH LAPAROSCOPIC GASTRIC SLEEVE RESECTION
Anesthesia: General | Site: Abdomen

## 2014-04-26 MED ORDER — EPHEDRINE SULFATE 50 MG/ML IJ SOLN
INTRAMUSCULAR | Status: DC | PRN
  Administered 2014-04-26: 10 mg via INTRAVENOUS

## 2014-04-26 MED ORDER — PROMETHAZINE HCL 25 MG/ML IJ SOLN
INTRAMUSCULAR | Status: AC
  Filled 2014-04-26: qty 1

## 2014-04-26 MED ORDER — MIDAZOLAM HCL 5 MG/5ML IJ SOLN
INTRAMUSCULAR | Status: DC | PRN
  Administered 2014-04-26: 2 mg via INTRAVENOUS

## 2014-04-26 MED ORDER — LIDOCAINE HCL (CARDIAC) 20 MG/ML IV SOLN
INTRAVENOUS | Status: AC
  Filled 2014-04-26: qty 5

## 2014-04-26 MED ORDER — ONDANSETRON HCL 4 MG/2ML IJ SOLN
4.0000 mg | INTRAMUSCULAR | Status: DC | PRN
Start: 2014-04-26 — End: 2014-04-29
  Administered 2014-04-26 – 2014-04-27 (×4): 4 mg via INTRAVENOUS
  Filled 2014-04-26 (×4): qty 2

## 2014-04-26 MED ORDER — NEOSTIGMINE METHYLSULFATE 10 MG/10ML IV SOLN
INTRAVENOUS | Status: DC | PRN
  Administered 2014-04-26: 4 mg via INTRAVENOUS

## 2014-04-26 MED ORDER — FENTANYL CITRATE 0.05 MG/ML IJ SOLN
INTRAMUSCULAR | Status: AC
  Filled 2014-04-26: qty 5

## 2014-04-26 MED ORDER — DEXTROSE 5 % IV SOLN
2.0000 g | INTRAVENOUS | Status: AC
  Administered 2014-04-26: 2 g via INTRAVENOUS

## 2014-04-26 MED ORDER — PROPOFOL 10 MG/ML IV BOLUS
INTRAVENOUS | Status: AC
Start: 2014-04-26 — End: 2014-04-26
  Filled 2014-04-26: qty 20

## 2014-04-26 MED ORDER — DEXAMETHASONE SODIUM PHOSPHATE 10 MG/ML IJ SOLN
INTRAMUSCULAR | Status: AC
Start: 2014-04-26 — End: 2014-04-26
  Filled 2014-04-26: qty 1

## 2014-04-26 MED ORDER — CEFOXITIN SODIUM 2 G IV SOLR
INTRAVENOUS | Status: AC
  Filled 2014-04-26: qty 2

## 2014-04-26 MED ORDER — UNJURY CHICKEN SOUP POWDER: 2.0000 [oz_av] | Freq: Four times a day (QID) | ORAL | Status: DC

## 2014-04-26 MED ORDER — NEOSTIGMINE METHYLSULFATE 10 MG/10ML IV SOLN
INTRAVENOUS | Status: AC
Start: 2014-04-26 — End: 2014-04-26
  Filled 2014-04-26: qty 1

## 2014-04-26 MED ORDER — ONDANSETRON HCL 4 MG/2ML IJ SOLN
INTRAMUSCULAR | Status: AC
  Filled 2014-04-26: qty 2

## 2014-04-26 MED ORDER — UNJURY VANILLA POWDER
2.0000 [oz_av] | Freq: Four times a day (QID) | ORAL | Status: DC
  Administered 2014-04-28 – 2014-04-29 (×5): 2 [oz_av] via ORAL

## 2014-04-26 MED ORDER — FENTANYL CITRATE 0.05 MG/ML IJ SOLN
INTRAMUSCULAR | Status: DC | PRN
  Administered 2014-04-26 (×2): 100 ug via INTRAVENOUS
  Administered 2014-04-26: 50 ug via INTRAVENOUS

## 2014-04-26 MED ORDER — HEPARIN SODIUM (PORCINE) 5000 UNIT/ML IJ SOLN
5000.0000 [IU] | Freq: Three times a day (TID) | INTRAMUSCULAR | Status: DC
  Administered 2014-04-26 – 2014-04-29 (×8): 5000 [IU] via SUBCUTANEOUS
  Filled 2014-04-26 (×11): qty 1

## 2014-04-26 MED ORDER — GLYCOPYRROLATE 0.2 MG/ML IJ SOLN
INTRAMUSCULAR | Status: AC
  Filled 2014-04-26: qty 3

## 2014-04-26 MED ORDER — ONDANSETRON HCL 4 MG/2ML IJ SOLN
INTRAMUSCULAR | Status: DC | PRN
  Administered 2014-04-26: 4 mg via INTRAVENOUS

## 2014-04-26 MED ORDER — PROPOFOL 10 MG/ML IV BOLUS
INTRAVENOUS | Status: DC | PRN
  Administered 2014-04-26: 200 mg via INTRAVENOUS

## 2014-04-26 MED ORDER — LIDOCAINE HCL (CARDIAC) 20 MG/ML IV SOLN
INTRAVENOUS | Status: DC | PRN
  Administered 2014-04-26: 60 mg via INTRAVENOUS

## 2014-04-26 MED ORDER — HEPARIN SODIUM (PORCINE) 5000 UNIT/ML IJ SOLN
5000.0000 [IU] | INTRAMUSCULAR | Status: AC
  Administered 2014-04-26: 5000 [IU] via SUBCUTANEOUS
  Filled 2014-04-26: qty 1

## 2014-04-26 MED ORDER — ACETAMINOPHEN 160 MG/5ML PO SOLN: 650.0000 mg | ORAL | Status: DC | PRN

## 2014-04-26 MED ORDER — GLYCOPYRROLATE 0.2 MG/ML IJ SOLN
INTRAMUSCULAR | Status: DC | PRN
  Administered 2014-04-26: 0.6 mg via INTRAVENOUS

## 2014-04-26 MED ORDER — ROCURONIUM BROMIDE 100 MG/10ML IV SOLN
INTRAVENOUS | Status: DC | PRN
  Administered 2014-04-26: 50 mg via INTRAVENOUS
  Administered 2014-04-26: 10 mg via INTRAVENOUS
  Administered 2014-04-26: 20 mg via INTRAVENOUS

## 2014-04-26 MED ORDER — OXYCODONE HCL 5 MG/5ML PO SOLN
5.0000 mg | ORAL | Status: DC | PRN
  Administered 2014-04-28: 5 mg via ORAL
  Filled 2014-04-26: qty 5

## 2014-04-26 MED ORDER — ROCURONIUM BROMIDE 100 MG/10ML IV SOLN
INTRAVENOUS | Status: AC
  Filled 2014-04-26: qty 1

## 2014-04-26 MED ORDER — LACTATED RINGERS IV SOLN
INTRAVENOUS | Status: DC
  Administered 2014-04-26: 14:00:00 via INTRAVENOUS
  Administered 2014-04-26: 1000 mL via INTRAVENOUS

## 2014-04-26 MED ORDER — MIDAZOLAM HCL 2 MG/2ML IJ SOLN
INTRAMUSCULAR | Status: AC
  Filled 2014-04-26: qty 2

## 2014-04-26 MED ORDER — ACETAMINOPHEN 160 MG/5ML PO SOLN: 325.0000 mg | ORAL | Status: DC | PRN

## 2014-04-26 MED ORDER — HYDROMORPHONE HCL 2 MG/ML IJ SOLN
INTRAMUSCULAR | Status: AC
Start: 2014-04-26 — End: 2014-04-26
  Filled 2014-04-26: qty 1

## 2014-04-26 MED ORDER — MORPHINE SULFATE 2 MG/ML IJ SOLN
2.0000 mg | INTRAMUSCULAR | Status: DC | PRN
  Administered 2014-04-26 (×4): 4 mg via INTRAVENOUS
  Administered 2014-04-27 (×2): 2 mg via INTRAVENOUS
  Administered 2014-04-27: 4 mg via INTRAVENOUS
  Filled 2014-04-26 (×2): qty 2
  Filled 2014-04-26: qty 1
  Filled 2014-04-26 (×3): qty 2
  Filled 2014-04-26: qty 1
  Filled 2014-04-26: qty 2

## 2014-04-26 MED ORDER — HYDROMORPHONE HCL 1 MG/ML IJ SOLN
INTRAMUSCULAR | Status: DC | PRN
  Administered 2014-04-26: 1 mg via INTRAVENOUS
  Administered 2014-04-26: 0.5 mg via INTRAVENOUS

## 2014-04-26 MED ORDER — UNJURY CHOCOLATE CLASSIC POWDER: 2.0000 [oz_av] | Freq: Four times a day (QID) | ORAL | Status: DC

## 2014-04-26 MED ORDER — PROMETHAZINE HCL 25 MG/ML IJ SOLN
6.2500 mg | INTRAMUSCULAR | Status: AC | PRN
  Administered 2014-04-26 (×2): 6.25 mg via INTRAVENOUS

## 2014-04-26 MED ORDER — KCL IN DEXTROSE-NACL 20-5-0.45 MEQ/L-%-% IV SOLN
INTRAVENOUS | Status: DC
  Administered 2014-04-27: 100 mL via INTRAVENOUS
  Administered 2014-04-27: 17:00:00 via INTRAVENOUS
  Administered 2014-04-28: 100 mL via INTRAVENOUS
  Administered 2014-04-28 – 2014-04-29 (×2): via INTRAVENOUS
  Filled 2014-04-26 (×8): qty 1000

## 2014-04-26 MED ORDER — LACTATED RINGERS IV SOLN
INTRAVENOUS | Status: DC | PRN
  Administered 2014-04-26 (×2): via INTRAVENOUS

## 2014-04-26 MED ORDER — LACTATED RINGERS IR SOLN
Status: DC | PRN
  Administered 2014-04-26: 1000 mL

## 2014-04-26 MED ORDER — HYDROMORPHONE HCL 1 MG/ML IJ SOLN: 0.2500 mg | INTRAMUSCULAR | Status: DC | PRN

## 2014-04-26 MED ORDER — DEXAMETHASONE SODIUM PHOSPHATE 10 MG/ML IJ SOLN
INTRAMUSCULAR | Status: DC | PRN
  Administered 2014-04-26: 10 mg via INTRAVENOUS

## 2014-04-26 MED ORDER — BUPIVACAINE LIPOSOME 1.3 % IJ SUSP
20.0000 mL | Freq: Once | INTRAMUSCULAR | Status: AC
  Administered 2014-04-26: 20 mL
  Filled 2014-04-26: qty 20

## 2014-04-26 MED ORDER — PHENYLEPHRINE 40 MCG/ML (10ML) SYRINGE FOR IV PUSH (FOR BLOOD PRESSURE SUPPORT)
PREFILLED_SYRINGE | INTRAVENOUS | Status: AC
  Filled 2014-04-26: qty 10

## 2014-04-26 SURGICAL SUPPLY — 64 items
APPLICATOR COTTON TIP 6IN STRL (MISCELLANEOUS) IMPLANT
APPLIER CLIP ROT 10 11.4 M/L (STAPLE)
APPLIER CLIP ROT 13.4 12 LRG (CLIP)
BLADE HEX COATED 2.75 (ELECTRODE) ×2 IMPLANT
BLADE SURG 15 STRL LF DISP TIS (BLADE) ×1 IMPLANT
BLADE SURG 15 STRL SS (BLADE) ×1
CABLE HIGH FREQUENCY MONO STRZ (ELECTRODE) ×2 IMPLANT
CLIP APPLIE ROT 10 11.4 M/L (STAPLE) IMPLANT
CLIP APPLIE ROT 13.4 12 LRG (CLIP) IMPLANT
DERMABOND ADVANCED (GAUZE/BANDAGES/DRESSINGS) ×1
DERMABOND ADVANCED .7 DNX12 (GAUZE/BANDAGES/DRESSINGS) ×1 IMPLANT
DEVICE SUT QUICK LOAD TK 5 (STAPLE) IMPLANT
DEVICE SUT TI-KNOT TK 5X26 (MISCELLANEOUS) IMPLANT
DEVICE SUTURE ENDOST 10MM (ENDOMECHANICALS) IMPLANT
DEVICE TROCAR PUNCTURE CLOSURE (ENDOMECHANICALS) ×2 IMPLANT
DRAIN CHANNEL 19F RND (DRAIN) IMPLANT
DRAPE CAMERA CLOSED 9X96 (DRAPES) ×2 IMPLANT
DRAPE UNIVERSAL PACK (DRAPES) ×2 IMPLANT
ELECT REM PT RETURN 9FT ADLT (ELECTROSURGICAL) ×2
ELECTRODE REM PT RTRN 9FT ADLT (ELECTROSURGICAL) ×1 IMPLANT
EVACUATOR SILICONE 100CC (DRAIN) IMPLANT
GAUZE SPONGE 4X4 12PLY STRL (GAUZE/BANDAGES/DRESSINGS) IMPLANT
GLOVE BIOGEL M 8.0 STRL (GLOVE) ×2 IMPLANT
GOWN SPEC L4 XLG W/TWL (GOWN DISPOSABLE) ×2 IMPLANT
GOWN STRL REUS W/TWL XL LVL3 (GOWN DISPOSABLE) ×6 IMPLANT
HANDLE STAPLE EGIA 4 XL (STAPLE) ×2 IMPLANT
HOVERMATT SINGLE USE (MISCELLANEOUS) ×2 IMPLANT
KIT BASIN OR (CUSTOM PROCEDURE TRAY) ×2 IMPLANT
NEEDLE SPNL 22GX3.5 QUINCKE BK (NEEDLE) ×2 IMPLANT
PENCIL BUTTON HOLSTER BLD 10FT (ELECTRODE) ×2 IMPLANT
RELOAD ENDO STITCH (ENDOMECHANICALS) IMPLANT
RELOAD STAPLER BLUE 60MM (STAPLE) ×2 IMPLANT
RELOAD STAPLER GOLD 60MM (STAPLE) IMPLANT
RELOAD STAPLER GREEN 60MM (STAPLE) ×1 IMPLANT
RELOAD TRI 45 ART MED THCK BLK (STAPLE) ×4 IMPLANT
RELOAD TRI 60 ART MED THCK BLK (STAPLE) ×6 IMPLANT
SCISSORS LAP 5X45 EPIX DISP (ENDOMECHANICALS) ×2 IMPLANT
SCRUB PCMX 4 OZ (MISCELLANEOUS) ×4 IMPLANT
SEALANT SURGICAL APPL DUAL CAN (MISCELLANEOUS) IMPLANT
SET IRRIG TUBING LAPAROSCOPIC (IRRIGATION / IRRIGATOR) ×2 IMPLANT
SHEARS CURVED HARMONIC AC 45CM (MISCELLANEOUS) ×2 IMPLANT
SLEEVE ADV FIXATION 12X100MM (TROCAR) ×2 IMPLANT
SLEEVE ADV FIXATION 5X100MM (TROCAR) ×4 IMPLANT
SLEEVE GASTRECTOMY 36FR VISIGI (MISCELLANEOUS) ×2 IMPLANT
SOLUTION ANTI FOG 6CC (MISCELLANEOUS) ×2 IMPLANT
SPONGE LAP 18X18 X RAY DECT (DISPOSABLE) ×2 IMPLANT
STAPLE ECHEON FLEX 60 POW ENDO (STAPLE) IMPLANT
STAPLER RELOAD BLUE 60MM (STAPLE) ×4
STAPLER RELOAD GOLD 60MM (STAPLE)
STAPLER RELOAD GREEN 60MM (STAPLE) ×2
SUT ETHILON 2 0 PS N (SUTURE) IMPLANT
SUT VIC AB 4-0 SH 18 (SUTURE) ×2 IMPLANT
SYR 20CC LL (SYRINGE) ×2 IMPLANT
SYR 50ML LL SCALE MARK (SYRINGE) ×2 IMPLANT
TOWEL OR 17X26 10 PK STRL BLUE (TOWEL DISPOSABLE) ×4 IMPLANT
TOWEL OR NON WOVEN STRL DISP B (DISPOSABLE) ×2 IMPLANT
TRAY FOLEY CATH 14FRSI W/METER (CATHETERS) ×2 IMPLANT
TROCAR ADV FIXATION 12X100MM (TROCAR) ×2 IMPLANT
TROCAR ADV FIXATION 5X100MM (TROCAR) ×2 IMPLANT
TROCAR BLADELESS 15MM (ENDOMECHANICALS) ×2 IMPLANT
TROCAR BLADELESS OPT 5 100 (ENDOMECHANICALS) ×2 IMPLANT
TUBING CONNECTING 10 (TUBING) ×2 IMPLANT
TUBING ENDO SMARTCAP (MISCELLANEOUS) ×2 IMPLANT
TUBING FILTER THERMOFLATOR (ELECTROSURGICAL) ×2 IMPLANT

## 2014-04-26 NOTE — H&P (Signed)
Chief Complaint: Weight regain after [place] band  History of Present Illness: Stacey Nichols is an 53 y.o. female who underwent a Swedish band placement down in [place]. Despite working really hard with exercise and never achieved great weight loss. Subsequently she was diagnosed and treated successfully for leukemia. Since then issues with arthritis in her knees has limited her activity and she has never been able to have successful weight loss. I reviewed his CT scan from [facility] and the upper GI that order. It appears that her tubing has broken her band is detached. It is very difficult to see and I was only able to perceive the band on her CT scan.  After extensive discussions with her, her daughter, and her best friend who act as her translator we discussed in detail were move all of the band and conversion to a sleeve. I even offered to reconnect her band and see if we could have weight loss with that but said she tried very hard but that she had suboptimal results. She would like to have conversion to a sleeve gastrectomy. I described to doing this in either one operation or in 2 operations depending on the findings at the time of the removal of her band. She also has some issues with reflux and may have a hiatal hernia that will need to be repaired at the same time.  Past Medical History   Diagnosis  Date   .  Cancer      leukemia    Past Surgical History   Procedure  Laterality  Date   .  Laparoscopic gastric banding   2000-2001     performed in [place]   .  Tubal ligation   1996   .  Abdominal hysterectomy   2001    Current Outpatient Prescriptions   Medication  Sig  Dispense  Refill   .  aspirin 81 MG tablet  Take 81 mg by mouth daily.     .  fexofenadine (ALLEGRA) 30 MG tablet  Take 30 mg by mouth 2 (two) times daily.     Marland Kitchen  HYDROcodone-acetaminophen (NORCO) 5-325 MG per tablet  Take 1 tablet by mouth every 4 (four) hours as needed for moderate pain.  30 tablet  0   .   montelukast (SINGULAIR) 10 MG tablet      .  naproxen sodium (ANAPROX) 550 MG tablet      .  topiramate (TOPAMAX) 50 MG tablet       No current facility-administered medications for this visit.   Latex  Family History   Problem  Relation  Age of Onset   .  Heart disease  Father    Social History: reports that she quit smoking about a year ago. Her smoking use included Cigarettes. She smoked 0.00 packs per day. She does not have any smokeless tobacco history on file. She reports that she drinks alcohol. She reports that she does not use illicit drugs.  REVIEW OF SYSTEMS :  Positive for heartburn ; otherwise negative  Physical Exam:  Blood pressure 100/60, pulse 74, resp. rate 16, height 5\' 2"  (1.575 m), weight 252 lb 3.2 oz (114.397 kg).  Body mass index is 46.12 kg/(m^2).  Gen: WDWN white female NAD  Neurological: Alert and oriented to person, place, and time. Motor and sensory function is grossly intact  Head: Normocephalic and atraumatic.  Eyes: Conjunctivae are normal. Pupils are equal, round, and reactive to light. No scleral icterus.  Neck: Normal range  of motion. Neck supple. No tracheal deviation or thyromegaly present.  Cardiovascular: SR without murmurs or gallops. No carotid bruits  Breast: Not examined  Respiratory: Effort normal. No respiratory distress. No chest wall tenderness. Breath sounds normal. No wheezes, rales or rhonchi.  Abdomen: Obese difficult to palpate the port in the left upper quadrant  GU: Not examined  Musculoskeletal: Normal range of motion. Extremities are nontender. No cyanosis, edema or clubbing noted Lymphadenopathy: No cervical, preauricular, postauricular or axillary adenopathy is present Skin: Skin is warm and dry. No rash noted. No diaphoresis. No erythema. No pallor. Pscyh: Normal mood and affect. Behavior is normal. Judgment and thought content normal.  LABORATORY RESULTS:  No results found for this or any previous visit (from the past 48  hour(s)).  RADIOLOGY RESULTS:  No results found.  Problem List:  Patient Active Problem List    Diagnosis  Date Noted   .  History of leukemia  01/10/2014   .  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"  01/10/2014   .  Morbid obesity-BMI 46  01/10/2014   .  OSA (obstructive sleep apnea)  01/10/2014   .  Arthritis-knees and ankles  01/10/2014   Assessment & Plan:  History of band placed in [place] with now detached tubing and nonfunction but with a history of suboptimal weight loss. She desires a conversion to a sleeve gastrectomy. Have gotten approval for removal of Swedish band and conversion to sleeve gastrectomy.  I have explained this to her with her interpreters and she understands and accepts the risks.   Matt B. Hassell Done, MD, [facility]  [phone] beeper  [phone]

## 2014-04-26 NOTE — Anesthesia Postprocedure Evaluation (Signed)
  Anesthesia Post-op Note  Patient: Stacey Nichols  Procedure(s) Performed: Procedure(s) (LRB): LAPAROSCOPIC GASTRIC BAND REMOVAL WITH LAPAROSCOPIC GASTRIC SLEEVE RESECTION (N/A) UPPER GI ENDOSCOPY (N/A)  Patient Location: PACU  Anesthesia Type: General  Level of Consciousness: awake and alert   Airway and Oxygen Therapy: Patient Spontanous Breathing  Post-op Pain: mild  Post-op Assessment: Post-op Vital signs reviewed, Patient's Cardiovascular Status Stable, Respiratory Function Stable, Patent Airway and No signs of Nausea or vomiting  Last Vitals:  Filed Vitals:   04/26/14 1743  BP: 124/76  Pulse: 82  Temp: 36.8 C  Resp: 18    Post-op Vital Signs: stable   Complications: No apparent anesthesia complications

## 2014-04-26 NOTE — Op Note (Signed)
Stacey Nichols, Stacey Nichols NO.:  [id number]  MEDICAL RECORD NO.:  [id number]  LOCATION:  [place]                         FACILITY:  [facility]  PHYSICIAN:  Isabel Caprice. Hassell Done, MD  DATE OF BIRTH:  [birth year]  DATE OF PROCEDURE:  04/26/2014 DATE OF DISCHARGE:                              OPERATIVE REPORT   PROCEDURE:  Laparoscopy with removal of laparotomy band (Swedish band placed in [place]), laparoscopic sleeve gastrectomy and endoscopy by Dr. Excell Seltzer.  SURGEON:  Isabel Caprice. Hassell Done, MD  ASSISTANT:  Darene Lamer. Hoxworth, M.D.  ANESTHESIA:  General endotracheal.  DESCRIPTION OF PROCEDURE:  The patient was taken to room 1 and given general anesthesia.  The abdomen was prepped with PCMX and draped sterilely.  Time-out was performed.  The patient has a latex allergy. The abdomen was entered through the left upper quadrant using a 5 mm Optiview without difficulty.  Multiple 5 mm ports were placed, and dissection first took down some adhesions of the gastric outlet area to the anterior abdominal wall.  This was mainly a fat and omentum stuck up there and when this was completed, we identified the pylorus.  She did have some evidence that her gallbladder had been stuck to that area, although there was not chronic-type adhesions.  We then put a Nathanson retractor beneath the liver and I began taking down the adhesions from the stomach and the Swedish band to the anterior portion of the under surface of the left lateral segment.  The band tubing was free in the abdomen and had broken off from the port.  I followed this down to the band and then mobilized the band.  This was a [place] band which is a little bit different from anything we have put in here.  We dissected down.  I went ahead and cut the buckle and we removed all the pieces and left them in the abdomen with the 15 mm port and extracted them through that without difficulty.  In the meantime, we went ahead and  surveyed the stomach.  We had taken down the plications of the stomach to the [place] band.  We took this up to the left crus.  I then took down the short gastrics measuring about 5-6 cm from the pylorus and the stomach did have some angulation there.  We elected to take down the short gastrics all the way to the left crus and had it very well mobilized. The blood supply on the lesser curvature was not jeopardized in any way. We then inserted the ViSiGi and this was placed into the pylorus.  We applied it to suction.  I then performed the sleeve beginning with first an application of the Covidien 4.5 mm black load and then with subsequent 6 cm black loads all the way up to complete the sleeve gastrectomy.  These had the TRS tissue reinforcement system buttress material on them.  I did not see any evidence of a hiatal hernia.  We did not see any evidence of a hiatal hernia on the upper GI series.  I made a separate incision in the left upper quadrant and cut down and excised her metal port which was  removed in toto.  I could see there where the port had broken off from the underlying tubing.  Port sites were injected with Exparel.  The 15 mm had been placed in very obliquely and the hole was small.  I went ahead and removed the [facility] retractor.  I had removed the specimen.  Dr. Excell Seltzer had endoscoped the patient and then after insufflation, there with no evidence of bubbles.  No evidence of bleeding.  The abdomen was deflated and the wounds were closed with 4-0 Vicryl and Dermabond.  The patient tolerated the procedure well, was taken to the recovery room in satisfactory condition.     Isabel Caprice Hassell Done, MD     MBM/MEDQ  D:  04/26/2014  T:  04/26/2014  Job:  [id number]

## 2014-04-26 NOTE — Interval H&P Note (Signed)
History and Physical Interval Note:  04/26/2014 10:11 AM  Stacey Nichols  has presented today for surgery, with the diagnosis of Morbid Obesity  The various methods of treatment have been discussed with the patient and family. After consideration of risks, benefits and other options for treatment, the patient has consented to  Procedure(s): LAPAROSCOPIC GASTRIC BAND REMOVAL WITH LAPAROSCOPIC GASTRIC SLEEVE RESECTION (N/A) as a surgical intervention .  The patient's history has been reviewed, patient examined, no change in status, stable for surgery.  I have reviewed the patient's chart and labs.  Questions were answered witht he aid of an interpreter to the patient's satisfaction.  I explained that our priorities would be to remove the band first and then see if we can complete the sleeve in the same setting.    Jahdiel Krol B

## 2014-04-26 NOTE — Brief Op Note (Signed)
04/26/2014  1:30 PM  PATIENT:  Stacey Nichols  53 y.o. female  PRE-OPERATIVE DIAGNOSIS:  Morbid Obesity  POST-OPERATIVE DIAGNOSIS:  Morbid Obesity  PROCEDURE:  Procedure(s): LAPAROSCOPIC GASTRIC BAND REMOVAL WITH LAPAROSCOPIC GASTRIC SLEEVE RESECTION (N/A) UPPER GI ENDOSCOPY (N/A)  SURGEON:  Surgeon(s) and Role:    * Kaylyn Lim, MD - Primary  PHYSICIAN ASSISTANT:   ASSISTANTS: Adonis Housekeeper, MD, FACS   ANESTHESIA:   general  EBL:  Total I/O In: 1700 [I.V.:1700] Out: 150 [Urine:125; Blood:25]  BLOOD ADMINISTERED:none  DRAINS: none   LOCAL MEDICATIONS USED:  BUPIVICAINE   SPECIMEN:  Source of Specimen:  stomach  DISPOSITION OF SPECIMEN:  PATHOLOGY  COUNTS:  YES  TOURNIQUET:  * No tourniquets in log *  DICTATION: .Other Dictation: Dictation Number [id number]  PLAN OF CARE: Admit to inpatient   PATIENT DISPOSITION:  PACU - hemodynamically stable.   Delay start of Pharmacological VTE agent (>24hrs) due to surgical blood loss or risk of bleeding: no

## 2014-04-26 NOTE — Op Note (Signed)
Procedure: Upper GI endoscopy  Description of procedure: Upper GI endoscopy is performed at the completion of laparoscopic sleeve gastrectomy by Dr.  Hassell Done.  The video endoscope was introduced into the upper esophagus and then passed to the EG junction at about 40 cm. The esophagus appeared normal. The gastric sleeve was entered. The sleeve was tensely distended with air while the outlet was obstructed under saline irrigation by the operating surgeon. There was no evidence of leak. The staple line was intact and without bleeding. The scope was advanced to the antrum and pylorus visualized. There was no stricture or twisting or mucosal abnormality, and particularly no narrowing noted at the incisura.  The pouch was then desufflated and the scope withdrawn.  Edward Jolly MD, FACS  04/26/2014, 4:04 PM

## 2014-04-26 NOTE — Transfer of Care (Signed)
Immediate Anesthesia Transfer of Care Note  Patient: Stacey Nichols  Procedure(s) Performed: Procedure(s): LAPAROSCOPIC GASTRIC BAND REMOVAL WITH LAPAROSCOPIC GASTRIC SLEEVE RESECTION (N/A) UPPER GI ENDOSCOPY (N/A)  Patient Location: PACU  Anesthesia Type:General  Level of Consciousness: awake, alert  and oriented  Airway & Oxygen Therapy: Patient Spontanous Breathing and Patient connected to face mask oxygen  Post-op Assessment: Report given to PACU RN and Post -op Vital signs reviewed and stable  Post vital signs: Reviewed and stable  Complications: No apparent anesthesia complications

## 2014-04-26 NOTE — Anesthesia Preprocedure Evaluation (Addendum)
Anesthesia Evaluation  Patient identified by MRN, date of birth, ID band Patient awake    Reviewed: Allergy & Precautions, H&P , NPO status , Patient's Chart, lab work & pertinent test results  Airway Mallampati: III TM Distance: <3 FB Neck ROM: Full    Dental no notable dental hx.    Pulmonary sleep apnea , former smoker,  breath sounds clear to auscultation  + decreased breath sounds      Cardiovascular negative cardio ROS  Rhythm:Regular Rate:Normal     Neuro/Psych CVA    GI/Hepatic negative GI ROS, Neg liver ROS,   Endo/Other  Morbid obesity  Renal/GU negative Renal ROS  negative genitourinary   Musculoskeletal negative musculoskeletal ROS (+)   Abdominal   Peds negative pediatric ROS (+)  Hematology negative hematology ROS (+)   Anesthesia Other Findings   Reproductive/Obstetrics negative OB ROS                         Anesthesia Physical Anesthesia Plan  ASA: III  Anesthesia Plan: General   Post-op Pain Management:    Induction: Intravenous  Airway Management Planned: Oral ETT  Additional Equipment:   Intra-op Plan:   Post-operative Plan: Extubation in OR  Informed Consent: I have reviewed the patients History and Physical, chart, labs and discussed the procedure including the risks, benefits and alternatives for the proposed anesthesia with the patient or authorized representative who has indicated his/her understanding and acceptance.   Dental advisory given  Plan Discussed with: CRNA and Surgeon  Anesthesia Plan Comments:         Anesthesia Quick Evaluation

## 2014-04-27 ENCOUNTER — Inpatient Hospital Stay (HOSPITAL_COMMUNITY): Payer: BC Managed Care – PPO

## 2014-04-27 ENCOUNTER — Encounter (HOSPITAL_COMMUNITY): Payer: Self-pay | Admitting: Surgery

## 2014-04-27 LAB — CBC WITH DIFFERENTIAL/PLATELET
BASOS PCT: 0 % (ref 0–1)
Basophils Absolute: 0 10*3/uL (ref 0.0–0.1)
EOS ABS: 0 10*3/uL (ref 0.0–0.7)
Eosinophils Relative: 0 % (ref 0–5)
HEMATOCRIT: 40.9 % (ref 36.0–46.0)
HEMOGLOBIN: 13.4 g/dL (ref 12.0–15.0)
LYMPHS ABS: 2.2 10*3/uL (ref 0.7–4.0)
Lymphocytes Relative: 21 % (ref 12–46)
MCH: 31.8 pg (ref 26.0–34.0)
MCHC: 32.8 g/dL (ref 30.0–36.0)
MCV: 97.1 fL (ref 78.0–100.0)
MONO ABS: 1.3 10*3/uL — AB (ref 0.1–1.0)
MONOS PCT: 13 % — AB (ref 3–12)
Neutro Abs: 6.9 10*3/uL (ref 1.7–7.7)
Neutrophils Relative %: 66 % (ref 43–77)
Platelets: 191 10*3/uL (ref 150–400)
RBC: 4.21 MIL/uL (ref 3.87–5.11)
RDW: 13.8 % (ref 11.5–15.5)
WBC: 10.4 10*3/uL (ref 4.0–10.5)

## 2014-04-27 LAB — HEMOGLOBIN AND HEMATOCRIT, BLOOD
HEMATOCRIT: 41.2 % (ref 36.0–46.0)
Hemoglobin: 13.5 g/dL (ref 12.0–15.0)

## 2014-04-27 MED ORDER — IOHEXOL 300 MG/ML  SOLN
50.0000 mL | Freq: Once | INTRAMUSCULAR | Status: AC | PRN
  Administered 2014-04-27: 80 mL via ORAL

## 2014-04-27 NOTE — Progress Notes (Signed)
VASCULAR LAB PRELIMINARY  PRELIMINARY  PRELIMINARY  PRELIMINARY  Bilateral lower extremity venous duplex completed.    Preliminary report:  Bilateral:  No evidence of DVT, superficial thrombosis, or Baker's Cyst.   Khaliya Golinski, RVS 04/27/2014, 8:45 AM

## 2014-04-27 NOTE — Care Management Note (Signed)
    Page 1 of 1   04/27/2014     10:36:03 AM CARE MANAGEMENT NOTE 04/27/2014  Patient:  Stacey Nichols, Stacey Nichols   Account Number:  [id number]  Date Initiated:  04/27/2014  Documentation initiated by:  Sunday Spillers  Subjective/Objective Assessment:   53 yo female admitted s/p sleeve gastrectomy. PTA lived at home alone.     Action/Plan:   Home when stable   Anticipated DC Date:  04/30/2014   Anticipated DC Plan:  [place]  CM consult      Choice offered to / List presented to:             Status of service:  Completed, signed off Medicare Important Message given?   (If response is "NO", the following Medicare IM given date fields will be blank) Date Medicare IM given:   Medicare IM given by:   Date Additional Medicare IM given:   Additional Medicare IM given by:    Discharge Disposition:  HOME/SELF CARE  Per UR Regulation:  Reviewed for med. necessity/level of care/duration of stay  If discussed at [place] of Stay Meetings, dates discussed:    Comments:

## 2014-04-27 NOTE — Progress Notes (Signed)
Patient ID: Stacey Nichols, female   DOB: [birth year], 53 y.o.   MRN: [id number] [facility] Progress Note:   1 Day Post-Op  Subjective: Mental status is clear.  No complaints Objective: Vital signs in last 24 hours: Temp:  [97.8 F (36.6 C)-99.9 F (37.7 C)] 98.4 F (36.9 C) (10/14 0532) Pulse Rate:  [65-91] 65 (10/14 0532) Resp:  [17-22] 17 (10/14 0532) BP: (116-138)/(68-87) 138/87 mmHg (10/14 0532) SpO2:  [92 %-100 %] 100 % (10/14 0532)  Intake/Output from previous day: 10/13 0701 - 10/14 0700 In: 2100 [I.V.:2100] Out: 3700 [Urine:3675; Blood:25] Intake/Output this shift: Total I/O In: -  Out: 200 [Urine:200]  Physical Exam: Work of breathing is normal.  Incisions ok  Lab Results:  Results for orders placed during the hospital encounter of 04/26/14 (from the past 48 hour(s))  HEMOGLOBIN AND HEMATOCRIT, BLOOD     Status: None   Collection Time    04/26/14  1:54 PM      Result Value Ref Range   Hemoglobin 13.5  12.0 - 15.0 g/dL   HCT 39.7  36.0 - 46.0 %  CBC     Status: Abnormal   Collection Time    04/26/14  3:51 PM      Result Value Ref Range   WBC 12.6 (*) 4.0 - 10.5 K/uL   RBC 4.22  3.87 - 5.11 MIL/uL   Hemoglobin 13.7  12.0 - 15.0 g/dL   HCT 40.6  36.0 - 46.0 %   MCV 96.2  78.0 - 100.0 fL   MCH 32.5  26.0 - 34.0 pg   MCHC 33.7  30.0 - 36.0 g/dL   RDW 13.7  11.5 - 15.5 %   Platelets 170  150 - 400 K/uL  CREATININE, SERUM     Status: None   Collection Time    04/26/14  3:51 PM      Result Value Ref Range   Creatinine, Ser 0.69  0.50 - 1.10 mg/dL   GFR calc non Af Amer >90  >90 mL/min   GFR calc Af Amer >90  >90 mL/min   Comment: (NOTE)     The eGFR has been calculated using the CKD EPI equation.     This calculation has not been validated in all clinical situations.     eGFR's persistently <90 mL/min signify possible Chronic Kidney     Disease.  CBC WITH DIFFERENTIAL     Status: Abnormal   Collection Time    04/27/14  5:05 AM      Result  Value Ref Range   WBC 10.4  4.0 - 10.5 K/uL   RBC 4.21  3.87 - 5.11 MIL/uL   Hemoglobin 13.4  12.0 - 15.0 g/dL   HCT 40.9  36.0 - 46.0 %   MCV 97.1  78.0 - 100.0 fL   MCH 31.8  26.0 - 34.0 pg   MCHC 32.8  30.0 - 36.0 g/dL   RDW 13.8  11.5 - 15.5 %   Platelets 191  150 - 400 K/uL   Neutrophils Relative % 66  43 - 77 %   Neutro Abs 6.9  1.7 - 7.7 K/uL   Lymphocytes Relative 21  12 - 46 %   Lymphs Abs 2.2  0.7 - 4.0 K/uL   Monocytes Relative 13 (*) 3 - 12 %   Monocytes Absolute 1.3 (*) 0.1 - 1.0 K/uL   Eosinophils Relative 0  0 - 5 %   Eosinophils Absolute 0.0  0.0 -  0.7 K/uL   Basophils Relative 0  0 - 1 %   Basophils Absolute 0.0  0.0 - 0.1 K/uL    Radiology/Results: No results found.  Anti-infectives: Anti-infectives   Start     Dose/Rate Route Frequency Ordered Stop   04/26/14 0745  cefOXitin (MEFOXIN) 2 g in dextrose 5 % 50 mL IVPB     2 g 100 mL/hr over 30 Minutes Intravenous On call to O.R. 04/26/14 0736 04/26/14 1058      Assessment/Plan: Problem List: Patient Active Problem List   Diagnosis Date Noted  . 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 History of leukemia 01/10/2014  . 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"  01/10/2014  . Morbid obesity-BMI 46 01/10/2014  . OSA (obstructive sleep apnea) 01/10/2014  . Arthritis-knees and ankles 01/10/2014    Awaiting UGI.  Dietary advancements to follow 1 Day Post-Op    LOS: 1 day   Matt B. Hassell Done, MD, [facility] [phone] beeper [phone]  04/27/2014 8:24 AM

## 2014-04-27 NOTE — Progress Notes (Signed)
Patient alert and oriented, Post op day 1.  Provided support and encouragement.  Encouraged pulmonary toilet, ambulation and small sips of liquids when swallow study returned satisfactorily.  All questions answered.  Will continue to monitor. 

## 2014-04-28 LAB — CBC WITH DIFFERENTIAL/PLATELET
BASOS PCT: 0 % (ref 0–1)
Basophils Absolute: 0 10*3/uL (ref 0.0–0.1)
EOS PCT: 2 % (ref 0–5)
Eosinophils Absolute: 0.2 10*3/uL (ref 0.0–0.7)
HEMATOCRIT: 43.2 % (ref 36.0–46.0)
Hemoglobin: 14.1 g/dL (ref 12.0–15.0)
LYMPHS PCT: 30 % (ref 12–46)
Lymphs Abs: 2.8 10*3/uL (ref 0.7–4.0)
MCH: 31.9 pg (ref 26.0–34.0)
MCHC: 32.6 g/dL (ref 30.0–36.0)
MCV: 97.7 fL (ref 78.0–100.0)
MONO ABS: 1 10*3/uL (ref 0.1–1.0)
Monocytes Relative: 11 % (ref 3–12)
Neutro Abs: 5.1 10*3/uL (ref 1.7–7.7)
Neutrophils Relative %: 57 % (ref 43–77)
Platelets: 192 10*3/uL (ref 150–400)
RBC: 4.42 MIL/uL (ref 3.87–5.11)
RDW: 13.7 % (ref 11.5–15.5)
WBC: 9.1 10*3/uL (ref 4.0–10.5)

## 2014-04-28 NOTE — Plan of Care (Signed)
Problem: Food- and Nutrition-Related Knowledge Deficit (NB-1.1) Goal: Nutrition education Formal process to instruct or train a patient/client in a skill or to impart knowledge to help patients/clients voluntarily manage or modify food choices and eating behavior to maintain or improve health.  Outcome: Completed/Met Date Met:  04/28/14 Nutrition Education Note  Received consult for diet education per DROP protocol.   S/P gastric sleeve. Pt is spanish speaking.  Discussed 2 week post op diet with pt. Emphasized that liquids must be non carbonated, non caffeinated, and sugar free. Fluid goals discussed. Pt to follow up with outpatient bariatric RD for further diet progression after 2 weeks. Multivitamins and minerals also reviewed. Pt will receive vitamins through automated service. Teach back method used, pt expressed understanding, expect good compliance.   Diet: First 2 Weeks  You will see the nutritionist about two (2) weeks after your surgery. The nutritionist will increase the types of foods you can eat if you are handling liquids well:  If you have severe vomiting or nausea and cannot handle clear liquids lasting longer than 1 day, call your surgeon  Protein Shake  Drink at least 2 ounces of shake 5-6 times per day  Each serving of protein shakes (usually 8 - 12 ounces) should have a minimum of:  15 grams of protein  And no more than 5 grams of carbohydrate  Goal for protein each day:  Men = 80 grams per day  Women = 60 grams per day  Protein powder may be added to fluids such as non-fat milk or Lactaid milk or Soy milk (limit to 35 grams added protein powder per serving)   Hydration  Slowly increase the amount of water and other clear liquids as tolerated (See Acceptable Fluids)  Slowly increase the amount of protein shake as tolerated  Sip fluids slowly and throughout the day  May use sugar substitutes in small amounts (no more than 6 - 8 packets per day; i.e. Splenda)   Fluid  Goal  The first goal is to drink at least 8 ounces of protein shake/drink per day (or as directed by the nutritionist); some examples of protein shakes are [employer], [employer], EAS Edge HP, and Unjury. See handout from pre-op Bariatric Education Class:  Slowly increase the amount of protein shake you drink as tolerated  You may find it easier to slowly sip shakes throughout the day  It is important to get your proteins in first  Your fluid goal is to drink 64 - 100 ounces of fluid daily  It may take a few weeks to build up to this  32 oz (or more) should be clear liquids  And  32 oz (or more) should be full liquids (see below for examples)  Liquids should not contain sugar, caffeine, or carbonation   Clear Liquids:  Water or Sugar-free flavored water (i.e. Fruit H2O, Propel)  Decaffeinated coffee or tea (sugar-free)  Crystal Lite, Wyler's Lite, Minute Maid Lite  Sugar-free Jell-O  Bouillon or broth  Sugar-free Popsicle: *Less than 20 calories each; Limit 1 per day   Full Liquids:  Protein Shakes/Drinks + 2 choices per day of other full liquids  Full liquids must be:  No More Than 12 grams of Carbs per serving  No More Than 3 grams of Fat per serving  Strained low-fat cream soup  Non-Fat milk  Fat-free Lactaid Milk  Sugar-free yogurt (Dannon Lite & Fit, Greek yogurt)     Clayton Bibles, MS, RD, LDN Pager: [phone] After  Hours Pager: [phone]

## 2014-04-28 NOTE — Progress Notes (Signed)
Patient ID: Stacey Nichols, female   DOB: [birth year], 53 y.o.   MRN: [id number] [facility] Progress Note:   2 Days Post-Op  Subjective: Mental status is clear.  Feeling better Objective: Vital signs in last 24 hours: Temp:  [98.8 F (37.1 C)-100 F (37.8 C)] 100 F (37.8 C) (10/15 1000) Pulse Rate:  [58-83] 64 (10/15 1000) Resp:  [18] 18 (10/15 1000) BP: (125-177)/(68-83) 177/73 mmHg (10/15 1000) SpO2:  [94 %-97 %] 94 % (10/15 1000)  Intake/Output from previous day: 10/14 0701 - 10/15 0700 In: 2666.7 [I.V.:2666.7] Out: 3200 [Urine:3200] Intake/Output this shift: Total I/O In: -  Out: 200 [Urine:200]  Physical Exam: Work of breathing is not labored.  Incisions ok.    Lab Results:  Results for orders placed during the hospital encounter of 04/26/14 (from the past 48 hour(s))  HEMOGLOBIN AND HEMATOCRIT, BLOOD     Status: None   Collection Time    04/26/14  1:54 PM      Result Value Ref Range   Hemoglobin 13.5  12.0 - 15.0 g/dL   HCT 39.7  36.0 - 46.0 %  CBC     Status: Abnormal   Collection Time    04/26/14  3:51 PM      Result Value Ref Range   WBC 12.6 (*) 4.0 - 10.5 K/uL   RBC 4.22  3.87 - 5.11 MIL/uL   Hemoglobin 13.7  12.0 - 15.0 g/dL   HCT 40.6  36.0 - 46.0 %   MCV 96.2  78.0 - 100.0 fL   MCH 32.5  26.0 - 34.0 pg   MCHC 33.7  30.0 - 36.0 g/dL   RDW 13.7  11.5 - 15.5 %   Platelets 170  150 - 400 K/uL  CREATININE, SERUM     Status: None   Collection Time    04/26/14  3:51 PM      Result Value Ref Range   Creatinine, Ser 0.69  0.50 - 1.10 mg/dL   GFR calc non Af Amer >90  >90 mL/min   GFR calc Af Amer >90  >90 mL/min   Comment: (NOTE)     The eGFR has been calculated using the CKD EPI equation.     This calculation has not been validated in all clinical situations.     eGFR's persistently <90 mL/min signify possible Chronic Kidney     Disease.  CBC WITH DIFFERENTIAL     Status: Abnormal   Collection Time    04/27/14  5:05 AM      Result Value  Ref Range   WBC 10.4  4.0 - 10.5 K/uL   RBC 4.21  3.87 - 5.11 MIL/uL   Hemoglobin 13.4  12.0 - 15.0 g/dL   HCT 40.9  36.0 - 46.0 %   MCV 97.1  78.0 - 100.0 fL   MCH 31.8  26.0 - 34.0 pg   MCHC 32.8  30.0 - 36.0 g/dL   RDW 13.8  11.5 - 15.5 %   Platelets 191  150 - 400 K/uL   Neutrophils Relative % 66  43 - 77 %   Neutro Abs 6.9  1.7 - 7.7 K/uL   Lymphocytes Relative 21  12 - 46 %   Lymphs Abs 2.2  0.7 - 4.0 K/uL   Monocytes Relative 13 (*) 3 - 12 %   Monocytes Absolute 1.3 (*) 0.1 - 1.0 K/uL   Eosinophils Relative 0  0 - 5 %   Eosinophils Absolute 0.0  0.0 - 0.7 K/uL   Basophils Relative 0  0 - 1 %   Basophils Absolute 0.0  0.0 - 0.1 K/uL  HEMOGLOBIN AND HEMATOCRIT, BLOOD     Status: None   Collection Time    04/27/14  4:11 PM      Result Value Ref Range   Hemoglobin 13.5  12.0 - 15.0 g/dL   HCT 41.2  36.0 - 46.0 %  CBC WITH DIFFERENTIAL     Status: None   Collection Time    04/28/14  4:25 AM      Result Value Ref Range   WBC 9.1  4.0 - 10.5 K/uL   RBC 4.42  3.87 - 5.11 MIL/uL   Hemoglobin 14.1  12.0 - 15.0 g/dL   HCT 43.2  36.0 - 46.0 %   MCV 97.7  78.0 - 100.0 fL   MCH 31.9  26.0 - 34.0 pg   MCHC 32.6  30.0 - 36.0 g/dL   RDW 13.7  11.5 - 15.5 %   Platelets 192  150 - 400 K/uL   Neutrophils Relative % 57  43 - 77 %   Neutro Abs 5.1  1.7 - 7.7 K/uL   Lymphocytes Relative 30  12 - 46 %   Lymphs Abs 2.8  0.7 - 4.0 K/uL   Monocytes Relative 11  3 - 12 %   Monocytes Absolute 1.0  0.1 - 1.0 K/uL   Eosinophils Relative 2  0 - 5 %   Eosinophils Absolute 0.2  0.0 - 0.7 K/uL   Basophils Relative 0  0 - 1 %   Basophils Absolute 0.0  0.0 - 0.1 K/uL    Radiology/Results: Dg Ugi W/water Sol Cm  04/27/2014   CLINICAL DATA:  Postop day 1 removal of the gastric band and gastric sleeve procedure for obesity.  EXAM: WATER SOLUBLE UPPER GI SERIES  TECHNIQUE: Single-column upper GI series was performed using water soluble contrast.  CONTRAST:  11mL OMNIPAQUE IOHEXOL 300 MG/ML  SOLN   COMPARISON:  01/17/2014  FLUOROSCOPY TIME:  54 seconds  FINDINGS: Distal esophagus is mildly distended. Gastro esophageal junction is patent.  Gastric sleeve procedure with stapling of the greater curvature. There is focal narrowing of the distal body/ antrum of the stomach which may be due to edema. Contrast flows into the duodenum without delay. No reflux was identified.  Negative for leak.  IMPRESSION: Post gastric sleeve procedure.  No obstruction or leak identified.  There is an area of narrowing of the distal gastric body which may be due to edema.   Electronically Signed   By: Franchot Gallo M.D.   On: 04/27/2014 11:18    Anti-infectives: Anti-infectives   Start     Dose/Rate Route Frequency Ordered Stop   04/26/14 0745  cefOXitin (MEFOXIN) 2 g in dextrose 5 % 50 mL IVPB     2 g 100 mL/hr over 30 Minutes Intravenous On call to O.R. 04/26/14 0736 04/26/14 1058      Assessment/Plan: Problem List: Patient Active Problem List   Diagnosis Date Noted  . 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 History of leukemia 01/10/2014  . 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"  01/10/2014  . Morbid obesity-BMI 46 01/10/2014  . OSA (obstructive sleep apnea) 01/10/2014  . Arthritis-knees and ankles 01/10/2014    Lives alone in [place] and is not ready for discharge today.  Still working to increase PO intake to adequate.   2 Days Post-Op    LOS:  2 days   Matt B. Hassell Done, MD, [facility] [phone] beeper [phone]  04/28/2014 11:32 AM

## 2014-04-29 NOTE — Discharge Summary (Signed)
Physician Discharge Summary  Patient ID: Stacey Nichols MRN: [id number] DOB/AGE: 01-28-1961 53 y.o.  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  UGI looked OK.  Slowly advanced diet until ready for discharge  Consults: none  Significant Diagnostic Studies: UGI    Discharge Exam: Blood pressure 94/65, pulse 79, temperature 98.4 F (36.9 C), temperature source Oral, resp. rate 18, height 5\' 2"  (1.575 m), weight 253 lb (114.76 kg), SpO2 98.00%. Incisions OK.    Disposition: Final discharge disposition not confirmed  Discharge Instructions   Diet - low sodium heart healthy    Complete by:  As directed      Discharge instructions    Complete by:  As directed   Follow bariatric dietary guidelines     Increase activity slowly    Complete by:  As directed      No wound care    Complete by:  As directed             Medication List         aspirin 81 MG tablet  Take 81 mg by mouth every morning.     carbamazepine 100 MG 12 hr tablet  Commonly known as:  TEGRETOL XR  Take 100 mg by mouth 2 (two) times daily.     fexofenadine 30 MG tablet  Commonly known as:  ALLEGRA  Take 30 mg by mouth 2 (two) times daily as needed (allergies.).     fluticasone 50 MCG/ACT nasal spray  Commonly known as:  FLONASE  Place 1-2 sprays into both nostrils 3 (three) times a week.     HYDROcodone-acetaminophen 5-325 MG per tablet  Commonly known as:  NORCO  Take 1 tablet by mouth every 4 (four) hours as needed for moderate pain.     ibuprofen 200 MG tablet  Commonly known as:  ADVIL,MOTRIN  Take 600-800 mg by mouth once as needed for mild pain or moderate  pain.     mometasone 50 MCG/ACT nasal spray  Commonly known as:  NASONEX  Place 2 sprays into the nose daily as needed (allergies.).     montelukast 10 MG tablet  Commonly known as:  SINGULAIR  Take 10 mg by mouth at bedtime as needed (allergies).     topiramate 50 MG tablet  Commonly known as:  TOPAMAX  Take 50 mg by mouth 2 (two) times daily. Was using for weight loss, not currently taking     Vitamin B-12 1000 MCG/15ML Liqd  Take 500 mcg by mouth once a week.           Follow-up Information   Follow up with Pedro Earls, MD.   Specialty:  General Surgery   Contact information:   [address] [place] [address] [phone]       Signed: Pedro Earls 04/29/2014, 10:01 AM

## 2014-04-29 NOTE — Progress Notes (Signed)
LATE ENTRY  Patient alert and oriented, Post op day 2.  Provided support and encouragement.  Encouraged pulmonary toilet, ambulation and small sips of liquids.  Patient advanced to protein shakes today, tolerating well. All questions answered.  Will continue to monitor.

## 2014-04-29 NOTE — Discharge Instructions (Signed)
Gastrectoma en manga  (Sleeve Gastrectomy) La gastrectoma en manga es una ciruga en la que se extirpa una porcin grande del [place]. Despus de la [place], el estmago ser un tubo estrecho del tamao aproximado de una banana. Esta ciruga se realiza para ayudar a la persona a Administrator, Civil Service. La persona pierde peso debido a que el tamao reducido del Paramedic restringe la cantidad de alimento que la persona puede comer. El Paramedic contendr una cantidad mucho menor de alimentos que la que poda contener antes de la [place]. Adems, la parte del estmago que se extirpa produce una hormona que causa hambre.  Esta ciruga se indica en las personas con obesidad mrbida, definida como el ndice de masa corporal [facility]) mayor a 40. El [facility] es la estimacin de la grasa corporal y se calcula a partir de la altura y el peso de una persona. Esta ciruga tambin est indicada en personas con un [facility] entre 59 y 18, si adems sufren otras enfermedades como diabetes mellitus tipo 2, apnea del sueo obstructiva o afecciones cardacas o pulmonares. (enfermedades cardiopulmonares).  INFORME A SU MDICO:   Cualquier alergia que tenga.   Todos los [employer] [place], incluyendo vitaminas, hierbas, gotas oftlmicas, cremas y medicamentos de venta libre.   Uso de corticoides (por va oral o cremas).   Problemas previos que usted o los [facility] de su familia hayan tenido con el uso de anestsicos.   Enfermedades de [employer].   Cirugas previas.   Posibilidad de embarazo, si correspondiera.   Otros problemas de [place]. RIESGOS Y COMPLICACIONES  En general, la gastrectoma en manga es un procedimiento seguro. Sin embargo, Games developer procedimiento, pueden surgir complicaciones. Las complicaciones posibles son:   Infeccin.  Hemorragias.  Cogulos sanguneos.  Lesiones en los rganos o tejidos circundantes.  Prdida de lquido del estmago hacia la cavidad abdominal (raro). ANTES DEL  PROCEDIMIENTO   Es posible que tenga que hacerse anlisis de [place], [place] de diagnstico por imgenes (como una radiografa o Magazine features editor) antes de la ciruga. Tambin es posible que le indiquen un estudio para Radio broadcast assistant esfago y como se mueve (manometra esofgica).  Le indicarn que siga una dieta lquida durante 2 a 3 semanas antes de la [place].  Consulte a su mdico si debe cambiar o suspender los medicamentos que toma habitualmente.  No coma ni beba nada durante al menos 8 horas antes del procedimiento   Haga arreglos para que alguien lo lleve a su casa despus la hospitalizacin. Tambin pdale a alguna persona que lo ayude con sus actividades mientras se recupera. PROCEDIMIENTO  Generalmente se utiliza la tcnica laparoscpica para este procedimiento:   Le administrarn medicamentos para hacerlo dormir durante el procedimiento (anestesia general). Este medicamento se aplica a travs de una va intravenosa (IV) que se coloca en una vena.  Cuando est dormido, le higienizarn y desinfectarn el abdomen.  Le practicarn varias incisiones pequeas en el abdomen.  El gran espacio dentro del abdomen se llena de aire para expandirlo. Esto le proporciona al cirujano mayor espacio para operar y le permite visualizar ms fcil sus rganos.  Se inserta un tubo delgado que ilumina y que tiene una pequea cmara en el extremo (laparoscopio) a Lawerance Cruel de una pequea incisin en el abdomen. La cmara del laparoscopio enva una imagen a una pantalla de televisin que se encuentra en el quirfano. De este modo, el mdico tendr Ardelia Mems buena visin del interior del abdomen.  A travs de las otras pequeas incisiones del abdomen  se insertan tubos huecos. A travs de estos tubos se coloca el instrumental necesario para los procedimientos.  El cirujano [place] grapas para dividir parte del estmago y Mattel extirpa a travs de una de las incisiones.  La parte del estmago que queda se refuerza con  puntos o pegamento quirrgico, o ambos, para Product/process development scientist prdidas de los contenidos del [place]. A travs de una de las incisiones se coloca un pequeo tubo (drenaje) para permitir que el lquido en exceso drene de la zona.  Las incisiones se cierran con puntos de sutura o grapas o pegamento. DESPUS DEL PROCEDIMIENTO   Ser controlado rigurosamente en el rea de recuperacin. Una vez que haya eliminado la anestesia, es probable que lo lleven a una habitacin comn del hospital.  Marin Comment administrarn medicamentos para Best boy y las nuseas.   Podr tener un drenaje en una de las incisiones del abdomen. Si se [place] un drenaje, lo dejarn en el lugar hasta que vuelva a su casa y lo quitarn en la prxima visita.   Lo estimularn a que camine varias veces por da. Esto ayuda a prevenir cogulos sanguneos.  Le indicarn que comience con una dieta lquida al da siguiente de la [place]. En algunos casos se indica un estudio para controlar que no haya prdidas antes de que pueda comer.  Deber hacer ejercicios de respiracin y [employer] que tendr que toser. Esto ayuda a prevenir las infecciones pulmonares despus de [place].  Ser necesario que Music therapist hospital [place].  Document Released: 03/03/2013 [facility] Patient Information 2015 [place]. This information is not intended to replace advice given to you by your health care provider. Make sure you discuss any questions you have with your health care provider.  Ciruga baritrica - Cuidados posteriores (Bariatric Surgery, Care After) Siga estas instrucciones durante las prximas semanas. Estas indicaciones le proporcionan informacin general acerca de cmo deber cuidarse despus del procedimiento. El mdico tambin podr darle instrucciones ms especficas. El tratamiento se ha planificado de acuerdo a las prcticas mdicas actuales, pero a veces se producen problemas. Comunquese con el mdico si tiene  algn problema o tiene dudas despus del procedimiento. QU ESPERAR DESPUS DEL PROCEDIMIENTO Despus del procedimiento, es tpico tener las siguientes sensaciones:  Social research officer, government.  Apata.  Cansancio.  Cambios en el estado de nimo.  Escalofros. No es infrecuente tener la piel seca y perder un poco de cabello despus de una ciruga baritrica. INSTRUCCIONES PARA EL CUIDADO EN EL HOGAR  No beba alcohol, no utilice transportes pblicos ni firme documentos importantes durante al menos un da luego de la [place].  No reanude la actividad fsica ni conduzca vehculos hasta que el cirujano lo autorice.  Evite levantar objetos que pesen ms de 10 libras (4.5 kg) durante las 6 semanas posteriores a la [place] o hasta que el cirujano lo autorice.  Slo tome medicamentos de venta libre o recetados para Conservation officer, historic buildings, Health and safety inspector o la fiebre como le indique el [place].  Reanude su dieta como le indique el [place]. Reanudar la dieta consumiendo alimentos lquidos y en pur, luego alimentos blandos y progresar a una dieta ms normal con el tiempo. Siga las indicaciones del cirujano o del nutricionista con respecto a qu alimentos podr comer y Electronics engineer, y en qu cantidad. Tendr que comer lentamente, de modo que no sienta molestias ni ganas de vomitar.  Tome duchas para higienizarse del modo que le indique el [place].  Cambie el vendaje tal como le indique  el cirujano.  Programe una visita de control con el cirujano segn las indicaciones. SOLICITE ATENCIN MDICA SI:   Observa enrojecimiento, hinchazn o aumento del dolor en la herida.  Observa pus en la zona de la herida.  Hay una secrecin en la herida que dura ms de Optician, dispensing.  La temperatura oral se eleva sin motivo por encima de 102F (38.9C).  Advierte un olor ftido que proviene de la herida o del vendaje.  La herida se abre (los bordes se separan) luego de la remocin de las suturas.  Nota un incremento del dolor en los hombros, en la  zona donde van los breteles.  Presenta episodios de mareos o se desmaya cuando est de pie.  Le falta el aire.  Tiene nuseas o vmitos persistentes.  Consulte con su mdico si el dolor parece Chief Operating Officer de mejorar. SOLICITE ATENCIN MDICA DE INMEDIATO SI:   Aparece una erupcin cutnea.  Tiene dificultad para respirar.  Aparece, o tiene la sensacin, de tener una reaccin o efectos secundarios debido a los medicamentos. Marthenia Rolling MS [facility] American Society for Bariatric Surgery (Sociedad norteamericana de ciruga baritrica): www.asbs.org Weight-control Information Network (WIN - 3M Company de informacin para el control del peso): win.AmenCredit.is Document Released: 03/03/2013 Document Revised: 11/15/2013 [facility] Patient Information 2015 [place]. This information is not intended to replace advice given to you by your health care provider. Make sure you discuss any questions you have with your health care provider.  Dieta a seguir luego de [place] baritrica (Diet Following Bariatric Surgery) La dieta baritrica est diseada para aportar lquidos y nutrientes, y, a Radiographer, therapeutic, promover el adelgazamiento y la cicatrizacin despus de Ardelia Mems ciruga baritrica. La dieta se divide en 3etapas. Progrese a cada etapa de la dieta con la autorizacin del mdico.  QU DEBO SABER ACERCA DE LA [place] DE UNA CIRUGA BARATRICA? El [place] le dar pautas individuales sobre alimentos especficos o el progreso de la dieta. Siga las indicaciones del cirujano. Durante todas las etapas de la dieta, seguir estas pautas generales:  Coma a horarios establecidos.  Tmese entre 30 y 44minutos para cada comida.  Ingiera bocados pequeos. Mastique la comida hasta que sea casi un lquido antes de tragarla. Intente apoyar los [employer]m [employer] cada bocado a modo de ayuda para comer ms lentamente, o haga un letrero recordatorio con la leyenda "comer despacio".  No  tome lquidos durante 74minutos antes y despus de las comidas.  [place] comidas.  Deje de comer cuando se sienta satisfecho. Si siente una opresin en el pecho, eso significa que est satisfecho. Espere 43minutos antes de tratar de comer nuevamente.  Tome diariamente una vitamina masticable adems de otros suplementos como se lo haya indicado el mdico.  Beba lquido a sorbos, por lo menos entre 21 y 80 onzas (1434ml y 181ml) [place], preferentemente agua.  No consuma dulces concentrados que contengan ms de 10g de azcar por porcin.  Las protenas son Ardelia Mems parte muy importante de la dieta. Coma protenas con cada comida, cuando sea posible. Intente comer primero la comida protenica. ETAPA1 DE LA DIETA BARITRICA La etapa1 comenzar despus de la [place] y se prolongar durante unas 2semanas, o como se lo haya indicado el mdico. Inmediatamente despus de la ciruga, seguir una dieta de lquidos transparentes. Despus de que el mdico lo autorice, podr hacer una dieta lquida espesa. [place], comer en los horarios programados (por ejemplo, a las 8a.m., a las 12 del  medioda o a las 5p.m.). Adems, tomar un suplemento protenico lquido de acuerdo con las recomendaciones del nutricionista. El nutricionista le informar cunto y con qu frecuencia puede comer.  Pautas para la dieta  Limite la ingesta a taza de alimentos slidos y a taza de bebidas por comida.  Necesitar consumir al menos 60g a 80g de protenas por da o lo que el nutricionista determine. Las pautas para elegir un suplemento protenico lquido incluyen:  Al menos 15 g de protena en una porcin de 8 onzas.  Menos de 20 g de carbohidratos totales por porcin de 8 onzas.  Menos de 5 g de grasas por porcin de 8 onzas.  Beba al menos 48 onzas (1445ml) de lquido por da, incluido el suplemento protenico.  Para consumir ms protenas, puede agregar 1cucharada de leche en  polvo sin grasa a cada taza de [employer].  No tome bebidas gaseosas, cafena, alcohol y dulces concentrados, como azcar, tortas y [place].  Tome una multivitamina masticable que contenga hierro. Hable con el mdico o el nutricionista sobre los suplementos adicionales. Bebidas (taza en total por comida)  Caf o t descafeinado.  Bebidas que contengan menos de 25g de azcar por porcin.  Bebidas dietticas o sin azcar.  Bebidas energizantes.  T helado sin azcar.  Caldos  Leche [place].  Leche de soja natural sin endulzar.  Gelatina sin azcar o helados de agua. Alimentos lquidos espesos (taza en total por comida).  Lquidos con alto contenido de protenas (limite el agregado de protena en polvo a 25g a 30g por porcin).  Sopa en crema con bajo contenido de grasas o sopa licuada.  Yogur endulzado artificialmente.  Flan sin azcar.  Queso crema saborizado con bajo contenido de [place].  Yogur natural o yogur tipo griego (con bajo contenido de [place]).  Pur de [place] sin Risk analyst.  Cereal de trigo caliente, crema de arroz, smola de maz. ETAPA2 DE LA DIETA BARITRICA (DIETA BLANDA O ALIMENTOS HECHOS PUR)  La etapa2 comienza aproximadamente 2semanas despus de la [place] y se prolonga durante unas 4semanas. Durante esta etapa, comer alimentos blandos, jugosos, molidos, en cubos o hechos pur en pequeas comidas, entre 3 y Financial risk analyst. Concentre la [employer] alimentos con alto contenido de protenas. Adems, beber un suplemento protenico lquido DTE Energy Company, 2veces por Training and development officer. Despus de una semana de comer alimentos protenicos blandos, puede empezar a incorporar otros alimentos blandos, adems de las protenas blandas. En esta etapa, debe reunirse con el nutricionista para empezar a prepararse para la etapa3 de la dieta baritrica.  Pautas para la dieta  Los alimentos ingeridos en cada comida no deben  exceder de taza a 1taza en total.  Tendr que licuar los alimentos slidos hasta que tengan la consistencia del pur de [place].  Elija alimentos con bajo contenido de [place], que son aquellos que tienen menos de 5g de grasa por porcin.  Incluya una protena en cada comida y colacin. Coma primero el alimento protenico. Intente comer entre 60g y 80g de protena por da, cuando sea posible.  Elija los cereales elaborados con granos blancos o refinados. Las elecciones que haga no deben tener ms de 2g de fibra por porcin.  Siga comiendo de [place] consciente y lentamente, y siempre prstele atencin a su cuerpo.  Mantenerse hidratado es muy importante durante esta etapa. Los alimentos de la dieta lquida espesa de la etapa1 pueden usarse para reemplazar una comida o una colacin.  Lentamente, incorpore otros alimentos  blandos a la dieta. En la siguiente seccin, se incluyen ejemplos de los alimentos blandos que pueden incorporarse a la dieta. Alimentos protenicos blandos  Frijoles y lentejas bien cocidos.  Huevos (revueltos, hervidos).  Tofu y otros productos blandos de soja (tempeh o hamburguesas vegetarianas de frijoles).  Pescado.  Carne de ave [place], bien cocida y [place]. Tambin puede probar la papilla para bebs elaborada con pollo o pavo.  Carnes molidas.  Queso cottage con bajo contenido de [place].  Hummus.  Yogur descremado o con bajo contenido de [employer].  Salsas y [employer] en caloras (para ayudar a aportar humedad). Otros alimentos blandos  [employer]. Estas incluyen las frutas en compota en conserva en almbar liviano o jugo natural, bananas, melones, duraznos, peras y fresas.  Verduras bien cocidas.  Tostadas o galletas saladas. Para estar seguro de que estas se ablanden, mastquelas por lo menos 20veces.  Cereal de trigo caliente  Avena natural.  Comida para bebs o frutas y verduras para nios pequeos.  Caldo de ave o de  verduras.  Batidos de fruta. ETAPA3 DE LA DIETA BARITRICA (DIETA NORMAL) Esta etapa comienza unas 6 a 8semanas despus de la [place] y continuar para Secretary/administrator. Podr comer alimentos de diferentes texturas. Pdale al nutricionista que lo ayude con la planificacin de las comidas y Teacher, music conductuales, para lograr que esta etapa final le permita alcanzar el xito a Barrister's clerk. Pautas para la dieta  Los alimentos ingeridos en cada comida no deben exceder  de taza a 1 taza. A medida que sana y evoluciona, tal vez pueda comer un poquito ms en cada comida. Siempre prstele atencin al cuerpo.  La dieta debe incluir alimentos de [employer].  Lentamente, incorpore los alimentos recomendados a la dieta. Consulte la siguiente seccin para obtener una lista de los alimentos recomendados.  Coma solamente en los horarios elegidos.  Deje de comer cuando se sienta satisfecho.  Los carbohidratos deben restringirse. No coma ms de 30g por comida o 130g por da. Un trozo de pan o una fruta mediana contienen aproximadamente entre 15g y 20g de carbohidratos.  Mantngase hidratado. Beba al menos entre [phone] 64onzas (1478ml y 18105ml) de lquido sin caloras y sin gas por Training and development officer. El agua es la mejor opcin.  Al principio, evite los alimentos difciles de digerir, como las palomitas de maz, los frutos secos, el apio, las semillas y las partes blancas de los ctricos. Con el tiempo, tal vez pueda comer estos alimentos.  Tome los suplementos vitamnicos como se lo haya indicado el mdico.  No coma apurado ni saltee las comidas. Si tiene problemas para comer, hable con el mdico o el nutricionista. QU ALIMENTOS PUEDO COMER EN LA ETAPA3? Cereales Elija los cereales integrales cuando sea posible; intente que la mitad de la cantidad total de cereales que come sean integrales. Estos incluyen los panes, las galletas saladas y las pastas de salvado. Cereales fros o  calientes sin azcares agregados. Arrz (integral o blanco).  Vegetales Elija diferentes verduras: estn todas permitidas.  Frutas Elija diferentes frutas: estn todas permitidas.  Carne y otros alimentos con protenas Elija las fuentes de protenas magras, como la carne de ave, el pescado y [place]. Al principio, tal vez deba cocinar las carnes hasta que estn blandas. Mantequilla de frutos secos. Frijoles. Lcteos Elija los productos lcteos con bajo contenido de grasas o descremados (como el queso, la [place] y Financial trader). Bebidas Caf descafeinado. T sin cafena. Refrescos sin azcar y sin  cafena. Limite el consumo de bebidas alcohlicas.  Condimentos Todos estn permitidos. [place] grasas y de [place], cuando sea posible.  Dulces y postres Opciones con bajo contenido de grasas y de Location manager. Como parte de una dieta saludable, todas las personas deben limitar el consumo de Nurse, learning disability.  Grasas y Emerson [employer] grasas saludables, como el aceite de [place], el aceite de canola y los [place].  Esta no es [employer] Foods Company de los alimentos o las bebidas recomendados. Consulte a su nutricionista para conocer ms opciones. Document Released: 07/01/2005 Document Revised: 07/06/2013 [facility] Patient Information 2015 [place]. This information is not intended to replace advice given to you by your health care provider. Make sure you discuss any questions you have with your health care provider.

## 2014-04-29 NOTE — Progress Notes (Addendum)
Patient alert and oriented, pain is controlled. Patient is tolerating fluids, plan to advance to protein shake today.  Reviewed Gastric sleeve discharge instructions with patient and patient is able to articulate understanding.  Provided information on BELT program, Support Group and [facility] outpatient pharmacy. All questions answered, will continue to monitor.  intrepreter Benjamine Sprague present in room to translate all discharge instructions.

## 2014-04-30 ENCOUNTER — Telehealth (INDEPENDENT_AMBULATORY_CARE_PROVIDER_SITE_OTHER): Payer: Self-pay | Admitting: General Surgery

## 2014-04-30 NOTE — Telephone Encounter (Signed)
Pt called with pain at or just below her knee in front of leg, no swelling. Discussed the chance of blood clots and encouraged to come to ER for eval and ultrasound

## 2014-05-10 ENCOUNTER — Ambulatory Visit: Payer: BC Managed Care – PPO

## 2014-05-13 ENCOUNTER — Encounter: Payer: BC Managed Care – PPO | Attending: Surgery | Admitting: Dietician

## 2014-05-13 DIAGNOSIS — Z6841 Body Mass Index (BMI) 40.0 and over, adult: Secondary | ICD-10-CM | POA: Diagnosis not present

## 2014-05-13 DIAGNOSIS — Z713 Dietary counseling and surveillance: Secondary | ICD-10-CM | POA: Insufficient documentation

## 2014-05-13 NOTE — Progress Notes (Signed)
Bariatric Follow up:  Appt start time: 925 end time:  1015  2 Week Post-Operative Nutrition Appointment  Patient was seen on 05/13/14 for Post-[facility]ative [facility].   The appointment was facilitated by [place] Language Interpreter, Doretha Imus.  Surgery date: Gastric sleeve Surgery type: 04/26/2014 Start weight at [facility]: 255.5 lbs on 03/01/14 Weight today: 240 lbs Weight change: 22 lbs   TANITA  BODY COMP RESULTS  04/07/14 05/13/14   BMI (kg/m^2) 47.9 43.9   Fat Mass (lbs) 132.5 100   Fat Free Mass (lbs) 129.5 140   Total Body Water (lbs) 95 102.5    The following the learning objectives were met by the patient during this course:  Identifies Phase 3A (Soft, High Proteins) Dietary Goals and will begin from 2 weeks post-operatively to 2 months post-operatively  Identifies appropriate sources of fluids and proteins   States protein recommendations and appropriate sources post-operatively  Identifies the need for appropriate texture modifications, mastication, and bite sizes when consuming solids  Identifies appropriate multivitamin and calcium sources post-operatively  Describes the need for physical activity post-operatively and will follow MD recommendations  States when to call healthcare provider regarding medication questions or post-operative complications  Handouts given during class include:  Phase 3A: Soft, High Protein Diet Handout  Phase 3B lean protein + non-starchy vegetables (per patient request)  Follow-Up Plan: Patient will follow-up at [facility] in 4 weeks for 6 week post-op nutrition visit for diet advancement per MD.

## 2014-05-17 ENCOUNTER — Ambulatory Visit: Payer: Self-pay | Admitting: Neurology

## 2014-06-14 ENCOUNTER — Encounter: Payer: BC Managed Care – PPO | Attending: Surgery | Admitting: Dietician

## 2014-06-14 DIAGNOSIS — Z6841 Body Mass Index (BMI) 40.0 and over, adult: Secondary | ICD-10-CM | POA: Insufficient documentation

## 2014-06-14 DIAGNOSIS — Z713 Dietary counseling and surveillance: Secondary | ICD-10-CM | POA: Diagnosis not present

## 2014-06-14 NOTE — Progress Notes (Signed)
  Follow-up visit:  6 Weeks Post-Operative Gastric sleeve Surgery  Medical Nutrition Therapy:  Appt start time: 150  End time:  220  Primary concerns today: Post-operative Bariatric Surgery Nutrition Management.  The visit was facilitated by [employer], [employer].  Hodan reports that things are going well. She is tolerating most foods. Feeling hungry sometimes but her stomach hurts if she eats more. She reports that she finds it difficult not to drink while she eats. Already eating vegetables like broccoli and spinach. Knee pain is resolving.   Surgery date: Gastric sleeve Surgery type: 04/26/2014 [facility] at [facility]: 255.5 lbs on 03/01/14 Weight today: 230.5 lbs Weight change: 10 lbs Total weight lost: 25 lbs Goal weight: 150 lbs   TANITA  BODY COMP RESULTS  04/07/14 05/13/14 06/14/14   BMI (kg/m^2) 47.9 43.9 42.2   Fat Mass (lbs) 132.5 100 113   Fat Free Mass (lbs) 129.5 140 117.5   Total Body Water (lbs) 95 102.5 86    Preferred Learning Style:   No preference indicated   Learning Readiness:   Ready  24-hr recall: B (AM): protein shake or egg (6-30g) Snk (AM):   L (PM): 3 oz meat with vegetables (21g) Snk (PM):   D (PM): 3 oz meat and vegetables or another shake (21-30g) Snk (PM):   Fluid intake: tea with lemon, less water due to weather  Estimated total protein intake: ~80 grams  Medications: see list Supplementation: taking  Drinking while eating: sometimes sips Hair loss: none Carbonated beverages: none N/V/D/C: a little constipation  Dumping syndrome: none  Recent physical activity:  Bicycle 30-40 minutes every 3 days  Progress Towards Goal(s):  In progress.  Handouts given during visit include:  Phase 3B lean protein + non-starchy vegetables   Nutritional Diagnosis:  [place]-3.3 Overweight/obesity related to past poor dietary habits and physical inactivity as evidenced by patient w/ recent gastric sleeve surgery following dietary  guidelines for continued weight loss.     Intervention:  Nutrition counseling provided. Goal: work up to getting 64 oz of fluid per day.  Teaching Method Utilized:  Visual Auditory Hands on  Barriers to learning/adherence to lifestyle change: none  Demonstrated degree of understanding via:  Teach Back   Monitoring/Evaluation:  Dietary intake, exercise, lap band fills, and body weight. Follow up in 6 weeks for 3 month post-op visit.

## 2014-06-14 NOTE — Patient Instructions (Addendum)
-  Work on getting 64 ounces of water per day  -Snacks: Quest Bars, Pure Protein bar, beef or turkey jerky 

## 2014-07-07 ENCOUNTER — Ambulatory Visit: Payer: Self-pay | Admitting: Neurology

## 2014-08-01 ENCOUNTER — Ambulatory Visit: Payer: BC Managed Care – PPO | Admitting: Dietician

## 2014-08-10 ENCOUNTER — Encounter: Payer: BLUE CROSS/BLUE SHIELD | Attending: Surgery | Admitting: Dietician

## 2014-08-10 DIAGNOSIS — Z6841 Body Mass Index (BMI) 40.0 and over, adult: Secondary | ICD-10-CM | POA: Diagnosis not present

## 2014-08-10 DIAGNOSIS — Z713 Dietary counseling and surveillance: Secondary | ICD-10-CM | POA: Diagnosis not present

## 2014-08-10 NOTE — Progress Notes (Signed)
  Follow-up visit:  3 months Post-Operative Gastric sleeve Surgery  Medical Nutrition Therapy:  Appt start time: 1150  End time:  1220  Primary concerns today: Post-operative Bariatric Surgery Nutrition Management.  The visit was facilitated by [employer], Felisa Bonier.  Stacey Nichols returns having lost 10 pounds of fat. She reports feeling healthy and her breathing has improved. However, she is not sleeping well and her sleep has declined since surgery. Stacey Nichols reports that she has been taking 8 bariatric multivitamins per day. We discussed taking only 2 per day.   Surgery date: Gastric sleeve Surgery type: 04/26/2014 Start weight at [facility] 255.5 lbs on 03/01/14 Weight today: 220.5 lbs Weight change: 10 lbs Total weight lost: 35 lbs Goal weight: 150 lbs   TANITA  BODY COMP RESULTS  04/07/14 05/13/14 06/14/14 08/10/14   BMI (kg/m^2) 47.9 43.9 42.2 40.3   Fat Mass (lbs) 132.5 100 113 104.5   Fat Free Mass (lbs) 129.5 140 117.5 116   Total Body Water (lbs) 95 102.5 86 85    Preferred Learning Style:   No preference indicated   Learning Readiness:   Ready  24-hr recall: B (AM): premier protein shake (30g) Snk (AM):   L (PM): 3 oz meat with vegetables (21g) Snk (PM):  Premier protein shake (30g) D (PM): 3 oz meat and vegetables (21g) Snk (PM):   Fluid intake: tea with lemon, less water due to weather (1.5 liters) Estimated total protein intake: 80+ grams  Medications: see list Supplementation: taking  Drinking while eating: sometimes sips Hair loss: yes Carbonated beverages: none N/V/D/C: a little constipation, improves with adding water  Dumping syndrome: none  Recent physical activity:  None this month; maybe 1x a week  Progress Towards Goal(s):  In progress.  Handouts provided: Support group flyers  Nutritional Diagnosis:  [place]-3.3 Overweight/obesity related to past poor dietary habits and physical inactivity as evidenced by patient w/ recent  gastric sleeve surgery following dietary guidelines for continued weight loss.     Intervention:  Nutrition counseling provided. Goal: work up to getting 64 oz of fluid per day.  Teaching Method Utilized:  Visual Auditory Hands on  Barriers to learning/adherence to lifestyle change: none  Demonstrated degree of understanding via:  Teach Back   Monitoring/Evaluation:  Dietary intake, exercise, and body weight. Follow up in 2 months for 5 month post-op visit.

## 2014-08-10 NOTE — Patient Instructions (Signed)
-  Work on getting 64 ounces of water per day  -Snacks: Quest Bars, Pure Protein bar, beef or [place] jerky

## 2014-11-04 NOTE — Discharge Summary (Signed)
PATIENT NAME:  Stacey, Nichols MR#:  [id number] DATE OF BIRTH:  06/19/1961  DATE OF ADMISSION:  01/14/2013 DATE OF DISCHARGE:  01/16/2013  DISCHARGE DIAGNOSES:  1. Left facial numbness of relatively sudden onset, likely Bell's palsy versus transient ischemic attack. 2. The patient's other diagnosis includes hypertriglyceridemia.  DISCHARGE MEDICATIONS: 1. cetrizine 10 mg p.o. daily.  2. Aspirin 81 mg p.o. daily.  3. Prednisone 60 mg daily for 5 days, followed by tapering 10 mg each day for a total of 10 days.  4. Valacyclovir 1 gram by mouth every 8 hours for 7 days.   CONSULTATIONS: None.   DIET: Low fat diet.   HOSPITAL COURSE: The patient is a 54 year old obese female with history of leukemia, in remission, who came because of relatively sudden onset of left facial numbness and left ear pain. The patient had no rash and noticed to have tingling and numbness on the left face while she was talking on the phone. The patient did not lose consciousness. No weakness of hands and legs. The patient also noticed some deviation of the mouth to her right side. The patient was able to close the left eye, but we could not tell whether she lost forehead fold because she took Botox injection. She was placed in observation for possible TIA. The patient's CT of the head is unremarkable. The patient noted to have some tingling and numbness of the left hand, so we repeated another CAT scan of the head yesterday, which did not show any acute changes. The patient was supposed to get an MRI of the brain, but she did have tattoos all over her body and also on the eyelids, so MRI department said these tattoos are from [place] and we did not know whether that has any metal in there, so MRI is not pursued because of the possible metal in the tattoos. The patient also told me that she could not have MRI before in 2010 at [facility] for the same reason, so we did not do the MRI. The patient's other labs significant for  carotid ultrasound that showed no hemodynamically significant stenosis. Echocardiogram showed EF of 50% to 55% with low-normal systolic function, impaired relaxation of LV pattern. The patient's LDL is 85, HDL is 44, triglycerides 307. UA is clear. CT head, as I mentioned, did not show any acute changes, so I discharged her with prednisone and Valtrex in case it is atypical idiopathic Bell's palsy. The patient advised to see Dr. Jennings Books, neurologist, as an outpatient, as soon as possible. The patient did have B12 levels and Lyme titers sent, but they are not available. The patient needs to follow up on that. The patient discharged home in stable condition. Did not have any trouble swallowing, did not have any trouble walking, so we did not consult speech therapy or physical therapy. We continued aspirin for her, and the same is told, that she can continue aspirin and also use artificial tears in both eyes.   TIME SPENT ON DISCHARGE PREPARATION: More than 30 minutes.   ____________________________ Epifanio Lesches, MD sk:OSi D: 01/16/2013 13:48:32 ET T: 01/16/2013 14:08:27 ET JOB#: [id number]  cc: Epifanio Lesches, MD, <Dictator> [facility] Myrla Halsted, MD Epifanio Lesches MD ELECTRONICALLY SIGNED 01/25/2013 09:73

## 2014-11-04 NOTE — H&P (Signed)
PATIENT NAME:  Stacey Nichols, Stacey Nichols MR#:  [id number] DATE OF BIRTH:  [birth year]  DATE OF ADMISSION:  01/14/2013  PRIMARY DOCTOR: From out of town.   ER PHYSICIAN: Dr. Corky Downs.   CHIEF COMPLAINT: Left facial tingling, numbness.   HISTORY OF PRESENT ILLNESS: The patient is a 54 year old female patient with a history of leukemia in remission. Came in because of left-sided face tingling and numbness. Started around 3 p.m. this afternoon. The patient was talking on the phone and suddenly noticed tingling and numbness on the left side. She went to check in the mirror. The mouth was deviated to the left side. Unable to close the left eye completely. The patient had no weakness in hands or legs. The patient has no weakness or numbness on the right side of the face. Only symptom that is positive is she had a headache 2 to 3 days ago mainly on the frontal side. She also feels nauseous today. No history of syncope. No history of incontinence. The patient had no recent shingles but has been having stress lately. According to the patient's family, the patient went swimming yesterday and noticed fever feeling. Had to come out of the pool and had to put on a coat. The patient did not loose consciousness and also was walking without any assistance.   PAST MEDICAL HISTORY: Significant for history of leukemia in remission since 6 years. Also history of shingles 2 years ago on the left side of the face and nostrils. She took Botox injection 6 months ago to forehead.    SURGERY: Significant for history of total abdominal hysterectomy and history of lap band surgery.   SOCIAL HISTORY: Smokes 2 to 3 cigarettes a day. Social drinking. No drugs.   ALLERGIES: LATEX.    MEDICATIONS: None.   REVIEW OF SYSTEMS:  CONSTITUTIONAL: Felt feverish yesterday but no fever today. No weakness.  EYES: No blurred vision.  ENT: No tinnitus. No epistaxis. The patient complains of pain in the left ear today. No difficulty swallowing.   RESPIRATION: No cough. No wheezing.  CARDIOVASCULAR: No chest pain. No orthopnea. No pedal edema.  GASTROINTESTINAL: The patient feels nauseous now but no vomiting. No diarrhea. No abdominal pain.  GENITOURINARY: No dysuria.  INTEGUMENT: No skin rashes.  MUSCULOSKELETAL: No joint pains.  NEUROLOGIC: Numbness and tingling on the left side of the face. The patient has no dementia. No headache now. No history of TIAs or CVAs.  PSYCHIATRIC: No anxiety or insomnia.   PHYSICAL EXAMINATION:  VITALS: Temperature 98.2, heart rate 94, blood pressure 140/87, sats 98% on room air.  GENERAL: Alert, awake, oriented.  HEAD: Atraumatic, normocephalic.  EYES: Pupils equal, reacting to light. Extraocular movements are intact.  ENT: No tympanic membrane congestion. No obvious rash on external ears on both ears. Nose: No turbinate hypertrophy. Throat: No oropharyngeal erythema.  NECK: Normal range of motion. No JVD. No carotid bruit. No lymphadenopathy.  LUNGS: Clear to auscultation. No wheezing. Not using accessory muscles for respiration.   CARDIOVASCULAR: S1 and S2 regular. PMI not displaced. No JVD. The patient has good pedal, dorsalis pedis, femoral artery and carotid pulsations.   ABDOMEN: Soft, nontender, nondistended. Bowel sounds present.  EXTREMITIES: No extremity edema. No cyanosis. No clubbing.  NEUROLOGIC: Alert, awake, oriented. The patient's cranial nerves II through XII are intact except for cranial nerve VII. The patient does have weakness, tingling, numbness on the left side of the face, unable to close the left eye completely. Unable to examine forehead because the patient  took a Botox injection 6 months ago, so unable to see the wrinkles. Right side of the face is spared. The patient does not have tingling or numbness. Sensations are more on the right side compared to the left. The patient does not have any obvious rash. Muscle power is 5/5 upper and lower extremities. Senses are intact. DTRs  2+ in both extremities.  PSYCHIATRIC: Mood and affect are within normal limits.   LABORATORY DATA: Sodium 142, potassium 4.6, chloride 107, bicarb 29, BUN 11, creatinine 0.86, glucose 95. Electrolytes as I mentioned. WBC 8.2, hemoglobin 15.2, hematocrit 44.6, platelets 221. CT of the head showed no evidence of acute infarct. Specifically, there are no findings suggesting evolving infarct. AREA OF >> decreased density in the posterior frontal lobe on the right may reflect sequela of previous ischemic infarct. EKG showed normal sinus 89 beats and no ST-T changes.   ASSESSMENT AND PLAN: This is a 54 year old female with left facial weakness, with left facial droop, tingling, numbness and symptoms consistent with Bell's palsy but will rule out transient ischemic attack and put her on observation status, get MRI of the brain, carotid ultrasound and echocardiogram. ,LYMES SEROLOGY,Continue aspirin. Check fasting lipase and see how she does. If everything is negative, we are going to start her on steroids and treat for Bell's palsy. Also follow up with outpatient neurologist. The patient right now is able to close the left eye, so we are not going to give any eyedrops for lubrication, and we are going to see how she does.   TIME SPENT: About 55 minutes. This is an observation.    ____________________________ Epifanio Lesches, MD sk:gb D: 01/14/2013 20:46:05 ET T: 01/14/2013 21:08:12 ET JOB#: [id number]  cc: Epifanio Lesches, MD, <Dictator> Epifanio Lesches MD ELECTRONICALLY SIGNED 02/04/2013 8:14

## 2014-11-15 ENCOUNTER — Ambulatory Visit: Payer: Self-pay | Admitting: Dietician

## 2014-11-23 ENCOUNTER — Encounter: Payer: BLUE CROSS/BLUE SHIELD | Attending: Surgery | Admitting: Dietician

## 2014-11-23 DIAGNOSIS — Z713 Dietary counseling and surveillance: Secondary | ICD-10-CM | POA: Insufficient documentation

## 2014-11-23 DIAGNOSIS — Z6839 Body mass index (BMI) 39.0-39.9, adult: Secondary | ICD-10-CM | POA: Insufficient documentation

## 2014-11-23 NOTE — Progress Notes (Signed)
  Follow-up visit:  7 months Post-Operative Gastric sleeve Surgery  Medical Nutrition Therapy:  Appt start time: 465  End time:  425  Primary concerns today: Post-operative Bariatric Surgery Nutrition Management.  The visit was facilitated by [employer], Reubin Milan.  Stacey Nichols returns having lost 4.5 pounds of fat. She has been having cravings, fruit and other foods that are not recommended, sometimes cake. Stacey Nichols also struggles with snacking at night. She is concerned about her vitamin labs and would like her doctor to check other labs commonly monitored in bariatric patients.   Surgery date: Gastric sleeve Surgery type: 04/26/2014 Start weight at [facility]: 255.5 lbs on 03/01/14 Weight today: 217 lbs Weight change: 3.5 lbs Total weight lost: 38.5 lbs Goal weight: 150 lbs   TANITA  BODY COMP RESULTS  04/07/14 05/13/14 06/14/14 08/10/14 11/23/14   BMI (kg/m^2) 47.9 43.9 42.2 40.3 39.7   Fat Mass (lbs) 132.5 100 113 104.5 100   Fat Free Mass (lbs) 129.5 140 117.5 116 117   Total Body Water (lbs) 95 102.5 86 85 85.5    Preferred Learning Style:   No preference indicated   Learning Readiness:   Ready  24-hr recall: B (AM): premier protein shake (30g) Snk (AM):    L (PM): 3 oz meat with vegetables (21g) Snk (PM): meat and vegetables D (PM): protein shake OR salad with meat (21g) Snk (PM):   Fluid intake: water and decaf coffee with cream and Splenda, occasionally a glass of wine Estimated total protein intake: 80+ grams  Medications: see list Supplementation: taking  Drinking while eating: sometimes sips Hair loss: slowing down, taking Biotin Carbonated beverages: sips N/V/D/C: a little constipation, diarrhea for 2-3 days (sometimes has to take immodium) Dumping syndrome: none  Recent physical activity:  None  Progress Towards Goal(s):  In progress.  Handouts provided: Support group flyers  Nutritional Diagnosis:  [place]-3.3 Overweight/obesity related to  past poor dietary habits and physical inactivity as evidenced by patient w/ recent gastric sleeve surgery following dietary guidelines for continued weight loss.     Intervention:  Nutrition counseling provided. -Start exercising! -Have iron, vitamin A, and vitamin B1 (thiamin) labs checked -Limit carbohydrates to 15 grams per meal  -Have fruit no more than 1x a day -Find something to keep your hands busy to avoid snacking at night  Teaching Method Utilized:  Visual Auditory Hands on  Barriers to learning/adherence to lifestyle change: none  Demonstrated degree of understanding via:  Teach Back   Monitoring/Evaluation:  Dietary intake, exercise, and body weight. Follow up in 3 months for 10 month post-op visit.

## 2014-11-23 NOTE — Patient Instructions (Addendum)
-  Start exercising! -Have iron, vitamin A, and vitamin B1 (thiamin) labs checked -Limit carbohydrates to 15 grams per meal  -Have fruit no more than 1x a day -Find something to keep your hands busy to avoid snacking at night

## 2015-01-18 ENCOUNTER — Other Ambulatory Visit: Payer: Self-pay | Admitting: Family Medicine

## 2015-03-13 ENCOUNTER — Encounter: Payer: BLUE CROSS/BLUE SHIELD | Attending: Surgery | Admitting: Dietician

## 2015-03-13 ENCOUNTER — Encounter: Payer: Self-pay | Admitting: Dietician

## 2015-03-13 DIAGNOSIS — Z6839 Body mass index (BMI) 39.0-39.9, adult: Secondary | ICD-10-CM | POA: Diagnosis not present

## 2015-03-13 DIAGNOSIS — Z713 Dietary counseling and surveillance: Secondary | ICD-10-CM | POA: Insufficient documentation

## 2015-03-13 NOTE — Patient Instructions (Addendum)
-  Have iron, vitamin A, and vitamin B1 (thiamin) labs checked -Limit carbohydrates to 15 grams per meal  -Have fruit no more than 1x a day -Find something to keep your hands busy to avoid snacking at night -Try Citracal Petites   TANITA  BODY COMP RESULTS  04/07/14 05/13/14 06/14/14 08/10/14 11/23/14 03/13/15   BMI (kg/m^2) 47.9 43.9 42.2 40.3 39.7 39.7   Fat Mass (lbs) 132.5 100 113 104.5 100 102   Fat Free Mass (lbs) 129.5 140 117.5 116 117 115   Total Body Water (lbs) 95 102.5 86 85 85.5 84

## 2015-03-13 NOTE — Progress Notes (Signed)
  Follow-up visit:  10 months Post-Operative Gastric sleeve Surgery  Medical Nutrition Therapy:  Appt start time: 1000  End time: 1020  Primary concerns today: Post-operative Bariatric Surgery Nutrition Management.  The visit was facilitated by [employer], [employer].  Stacey Nichols states that she is feeling well and has more energy and sleeps better. She is discouraged to have not lost any weight but she can tell she has lost inches. She has begun exercising by walking for an hour 5 days a week.  Surgery date: Gastric sleeve Surgery type: 04/26/2014 Start weight at [facility]: 255.5 lbs on 03/01/14 Weight today: 217 lbs Weight change: 0 lbs Total weight lost: 38.5 lbs Goal weight: 150 lbs   TANITA  BODY COMP RESULTS  04/07/14 05/13/14 06/14/14 08/10/14 11/23/14 03/13/15   BMI (kg/m^2) 47.9 43.9 42.2 40.3 39.7 39.7   Fat Mass (lbs) 132.5 100 113 104.5 100 102   Fat Free Mass (lbs) 129.5 140 117.5 116 117 115   Total Body Water (lbs) 95 102.5 86 85 85.5 84    Preferred Learning Style:   No preference indicated   Learning Readiness:   Ready  24-hr recall: B (AM): protein shake OR coffee and egg with vegetables and saltine crackers  Snk (AM):    L (PM): vegetable soup or vegetables and small amount of chicken or fish Snk (PM):  D (PM): edamame, fruit Snk (PM): sugar free popsicles  Fluid intake: water and decaf coffee with cream and Splenda, (2 liters water), sips of soda, occasionally a glass of wine Estimated total protein intake: 80+ grams  Medications: see list Supplementation: taking, sometimes forgets calcium  Drinking while eating: sometimes sips Hair loss: slowing down, taking Biotin Carbonated beverages: sips N/V/D/C: a little constipation, diarrhea for 2-3 days (sometimes has to take immodium) Dumping syndrome: none  Recent physical activity:  Walking for an hour about 5 days a week  Progress Towards Goal(s):  In progress.  Handouts provided: Support  group flyers  Nutritional Diagnosis:  [place]-3.3 Overweight/obesity related to past poor dietary habits and physical inactivity as evidenced by patient w/ recent gastric sleeve surgery following dietary guidelines for continued weight loss.     Intervention:  Nutrition counseling provided. -Have iron, vitamin A, and vitamin B1 (thiamin) labs checked -Limit carbohydrates to 15 grams per meal  -Have fruit no more than 1x a day -Find something to keep your hands busy to avoid snacking at night  Teaching Method Utilized:  Visual Auditory Hands on  Barriers to learning/adherence to lifestyle change: none  Demonstrated degree of understanding via:  Teach Back   Monitoring/Evaluation:  Dietary intake, exercise, and body weight. Follow up in 2 months for 12 month post-op visit.

## 2015-05-15 ENCOUNTER — Encounter: Payer: BLUE CROSS/BLUE SHIELD | Attending: Surgery | Admitting: Dietician

## 2015-05-15 ENCOUNTER — Encounter: Payer: Self-pay | Admitting: Dietician

## 2015-05-15 DIAGNOSIS — Z6839 Body mass index (BMI) 39.0-39.9, adult: Secondary | ICD-10-CM | POA: Diagnosis not present

## 2015-05-15 DIAGNOSIS — Z713 Dietary counseling and surveillance: Secondary | ICD-10-CM | POA: Insufficient documentation

## 2015-05-15 NOTE — Progress Notes (Signed)
  Follow-up visit:  12 months Post-Operative Gastric sleeve Surgery  Medical Nutrition Therapy:  Appt start time: 1470  End time: 1040  Primary concerns today: Post-operative Bariatric Surgery Nutrition Management.  Appointment was translated by [place] interpreter, Terrial Rhodes, via [employer].  Tashawn returns today having gained 7.5 lbs. She reports she is disappointed because she has gained weight. Thinks this may be due to being on vacation and having large portions. [place] reports getting off track and would like a more structured plan to follow.  Surgery date: Gastric sleeve Surgery type: 04/26/2014 Start weight at [facility]: 255.5 lbs on 03/01/14 Weight today: 224.5 lbs Weight change: 7.5 lbs gain Total weight lost: 31 lbs Goal weight: 150 lbs   TANITA  BODY COMP RESULTS  04/07/14 05/13/14 06/14/14 08/10/14 11/23/14 03/13/15 05/15/15   BMI (kg/m^2) 47.9 43.9 42.2 40.3 39.7 39.7 41.1   Fat Mass (lbs) 132.5 100 113 104.5 100 102 107.5   Fat Free Mass (lbs) 129.5 140 117.5 116 117 115 117   Total Body Water (lbs) 95 102.5 86 85 85.5 84 85.5    Preferred Learning Style:   No preference indicated   Learning Readiness:   Ready  24-hr recall: B (AM): protein shake OR coffee and egg with vegetables and saltine crackers  Snk (AM):    L (PM): vegetable soup or vegetables and small amount of chicken or fish Snk (PM):  D (PM): edamame, fruit Snk (PM): sugar free popsicles  Fluid intake: water and decaf coffee with cream and Splenda, (48 oz water) Estimated total protein intake: 80+ grams  Medications: see list Supplementation: taking, sometimes forgets calcium  Drinking while eating: sometimes sips Hair loss: slowing down, taking Biotin Carbonated beverages: sips N/V/D/C: constipation Dumping syndrome: none  Recent physical activity:  Walking 3x a week  Progress Towards Goal(s):  In progress.  Handouts provided: Pre op diet  Nutritional Diagnosis:  [place]-3.3 Overweight/obesity  related to past poor dietary habits and physical inactivity as evidenced by patient w/ recent gastric sleeve surgery following dietary guidelines for continued weight loss.     Intervention:  Nutrition counseling provided.  Teaching Method Utilized:  Visual Auditory Hands on  Barriers to learning/adherence to lifestyle change: none  Demonstrated degree of understanding via:  Teach Back   Monitoring/Evaluation:  Dietary intake, exercise, and body weight. Follow up in 6 weeks for 13.5 month post-op visit.

## 2015-05-15 NOTE — Patient Instructions (Addendum)
-  Have iron, vitamin A, and vitamin B1 (thiamin) labs checked -Limit carbohydrates to 15 grams per meal  -Have fruit no more than 1x a day -Find something to keep your hands busy to avoid snacking at night -Try Citracal Petites  -Try following the pre op diet again  -3 oz lean protein per meal: chicken, lean cuts of beef and pork, fish, eggs, deli meat, low fat 2% cheese -Non starchy vegetables (any veggie that is NOT corn, peas, or potatoes) -Limit carbohydrates to 15 grams or less per meal (1/2 cup)  -Eat meats first, then vegetables, then starch -Stick to mainly water and coffee with artificial sweetener -Increase water to 64 oz per day  -Continue to walk as tolerated  -Stick to 1 serving of fruit per day (1/2 cup)

## 2015-06-20 ENCOUNTER — Ambulatory Visit (INDEPENDENT_AMBULATORY_CARE_PROVIDER_SITE_OTHER): Payer: BLUE CROSS/BLUE SHIELD | Admitting: Family Medicine

## 2015-06-20 ENCOUNTER — Encounter: Payer: Self-pay | Admitting: Family Medicine

## 2015-06-20 ENCOUNTER — Ambulatory Visit
Admission: RE | Admit: 2015-06-20 | Discharge: 2015-06-20 | Disposition: A | Discharge status: Home / Self Care | Payer: BLUE CROSS/BLUE SHIELD | Source: Ambulatory Visit | Attending: Family Medicine | Admitting: Family Medicine

## 2015-06-20 VITALS — BP 108/68 | HR 93 | Temp 98.8°F | Resp 17 | Ht 62.0 in | Wt 225.5 lb

## 2015-06-20 DIAGNOSIS — R0789 Other chest pain: Secondary | ICD-10-CM | POA: Insufficient documentation

## 2015-06-20 DIAGNOSIS — C9201 Acute myeloblastic leukemia, in remission: Secondary | ICD-10-CM | POA: Insufficient documentation

## 2015-06-20 DIAGNOSIS — E559 Vitamin D deficiency, unspecified: Secondary | ICD-10-CM

## 2015-06-20 DIAGNOSIS — Z856 Personal history of leukemia: Secondary | ICD-10-CM | POA: Diagnosis not present

## 2015-06-20 DIAGNOSIS — E785 Hyperlipidemia, unspecified: Secondary | ICD-10-CM | POA: Diagnosis not present

## 2015-06-20 DIAGNOSIS — R42 Dizziness and giddiness: Secondary | ICD-10-CM

## 2015-06-20 NOTE — Progress Notes (Signed)
Name: Stacey Nichols   MRN: [id number]    DOB: 04-Jan-1961   Date:06/20/2015       Progress Note  Subjective  Chief Complaint  Chief Complaint  Patient presents with  . Medication Refill  . Hyperlipidemia    Hyperlipidemia This is a chronic problem. The problem is uncontrolled. Recent lipid tests were reviewed and are high (Pt. had Lipid testing done at [facility] and was informed that her cholesterol was high). Exacerbating diseases include obesity. Associated symptoms include myalgias. Pertinent negatives include no chest pain (has right side chest pressure last 2 weeks) or shortness of breath. Current antihyperlipidemic treatment includes diet change.   Pt. Has history of Vitamin D deficiency, last level obtained in March 2016 was 24.2. Since her recent bariatric surgery, Vitamin D level was checked again and was lower, although she does not know the exact number.  Pt. Also has history of Leukemia, in remission. She follows up with Hematologist once a year. She would like to have her WBC count repeated today. No concerning symptoms except for dizziness for 2 weeks and hands feel numb, especially when she is sleeping..  Past Medical History  Diagnosis Date  . Cancer ([facility])     leukemia  . Sleep apnea   . High triglycerides   . H/O Bell's palsy   . Stroke [facility])     "mini stroke-caused bells palsy for 1 month"  . Pneumonia     hx of   . Depression   . Anxiety   . GERD (gastroesophageal reflux disease)   . H/O hiatal hernia   . Headache     occasional  . Arthritis     severe  . Anemia     hx of  . History of cancer chemotherapy     Past Surgical History  Procedure Laterality Date  . Laparoscopic gastric banding  2000    performed in [place]  . Tubal ligation  1996  . Abdominal hysterectomy  2001  . Upper gi endoscopy N/A 04/26/2014    Procedure: UPPER GI ENDOSCOPY;  Surgeon: Pedro Earls, MD;  Location: [facility];  Service: General;  Laterality: N/A;    Family  History  Problem Relation Age of Onset  . Heart disease Father     Social History   Social History  . Marital Status: Married    Spouse Name: N/A  . Number of Children: N/A  . Years of Education: N/A   Occupational History  . Not on file.   Social History Main Topics  . Smoking status: Former Smoker -- 30 years    Types: Cigarettes    Quit date: 07/15/2005  . Smokeless tobacco: Never Used  . Alcohol Use: Yes     Comment: occasional  . Drug Use: No  . Sexual Activity: Not on file   Other Topics Concern  . Not on file   Social History Narrative     Current outpatient prescriptions:  .  aspirin 81 MG tablet, Take 81 mg by mouth every morning. , Disp: , Rfl:  .  azelastine (OPTIVAR) 0.05 % ophthalmic solution, PLACE 1 DROP INTO AFFECTED EYE TWICE A DAY, Disp: , Rfl: 3 .  carbamazepine (TEGRETOL XR) 100 MG 12 hr tablet, Take 100 mg by mouth 2 (two) times daily., Disp: , Rfl:  .  Cyanocobalamin (VITAMIN B-12) 1000 MCG/15ML LIQD, Take 500 mcg by mouth once a week., Disp: , Rfl:  .  EPIPEN 2-PAK 0.3 MG/0.3ML SOAJ injection, USE AS  DIRECTED AS NEEDED, Disp: , Rfl: 1 .  fluticasone (FLONASE) 50 MCG/ACT nasal spray, Place 1-2 sprays into both nostrils 3 (three) times a week. , Disp: , Rfl:  .  ibuprofen (ADVIL,MOTRIN) 200 MG tablet, Take 600-800 mg by mouth once as needed for mild pain or moderate pain., Disp: , Rfl:  .  Iron TABS, Take 1 tablet by mouth daily., Disp: , Rfl:  .  levocetirizine (XYZAL) 5 MG tablet, Take 5 mg by mouth every evening., Disp: , Rfl: 2 .  mometasone (NASONEX) 50 MCG/ACT nasal spray, Place 2 sprays into the nose daily as needed (allergies.). , Disp: , Rfl:  .  montelukast (SINGULAIR) 10 MG tablet, Take 10 mg by mouth at bedtime as needed (allergies). , Disp: , Rfl:  .  Multiple Vitamins-Minerals (MULTI COMPLETE PO), Take 1 tablet by mouth daily., Disp: , Rfl:  .  Pediatric Multiple Vitamins (CHEWABLE MULTIPLE VITAMINS PO), Take 1 tablet by mouth daily.,  Disp: , Rfl:   Allergies  Allergen Reactions  . Latex Other (See Comments)  . Tape Rash    [Rash] rash - paper tape OK    Review of Systems  Constitutional: Positive for weight loss. Negative for fever, chills and malaise/fatigue.  Respiratory: Negative for shortness of breath.   Cardiovascular: Negative for chest pain (has right side chest pressure last 2 weeks).  Musculoskeletal: Positive for myalgias and joint pain.  Neurological: Positive for dizziness.    Objective  Filed Vitals:   06/20/15 0954  BP: 108/68  Pulse: 93  Temp: 98.8 F (37.1 C)  TempSrc: Oral  Resp: 17  Height: 5\' 2"  (1.575 m)  Weight: 225 lb 8 oz (102.286 kg)  SpO2: 94%    Physical Exam  Constitutional: She is oriented to person, place, and time and well-developed, well-nourished, and in no distress.  HENT:  Head: Normocephalic and atraumatic.  Right Ear: Tympanic membrane and ear canal normal.  Left Ear: Tympanic membrane and ear canal normal.  Eyes: Pupils are equal, round, and reactive to light.  Cardiovascular: Normal rate, regular rhythm and normal heart sounds.   Pulmonary/Chest: Effort normal and breath sounds normal. She has no wheezes. She has no rales.  Abdominal: Soft. Bowel sounds are normal. There is no tenderness.  Neurological: She is alert and oriented to person, place, and time. She has intact cranial nerves.  Skin: Skin is warm and dry.  Nursing note and vitals reviewed.    Assessment & Plan  1. Dizziness of unknown cause We will obtain lab work for evaluation of intermittent dizziness. - CBC with Differential - Comprehensive Metabolic Panel (CMET)  2. Pressure in right side of chest EKG is NSR. Referral to cardiology for consideration of stress testing. - DG Chest 2 View; Future - EKG 12-Lead - Ambulatory referral to Cardiology  3. Dyslipidemia  - Lipid Profile - Comprehensive Metabolic Panel (CMET)  4. Vitamin D deficiency  - Vitamin D (25 hydroxy)  5.  History of leukemia  - CBC with Differential    Sie Formisano [facility] 06/20/2015 10:37 AM

## 2015-06-21 LAB — VITAMIN D 25 HYDROXY (VIT D DEFICIENCY, FRACTURES): Vit D, 25-Hydroxy: 21.6 ng/mL — ABNORMAL LOW (ref 30.0–100.0)

## 2015-06-21 LAB — CBC WITH DIFFERENTIAL/PLATELET
BASOS: 0 %
Basophils Absolute: 0 10*3/uL (ref 0.0–0.2)
EOS (ABSOLUTE): 0.2 10*3/uL (ref 0.0–0.4)
EOS: 3 %
HEMOGLOBIN: 14.2 g/dL (ref 11.1–15.9)
Hematocrit: 41.8 % (ref 34.0–46.6)
Immature Grans (Abs): 0 10*3/uL (ref 0.0–0.1)
Immature Granulocytes: 0 %
LYMPHS ABS: 2.6 10*3/uL (ref 0.7–3.1)
Lymphs: 41 %
MCH: 32.6 pg (ref 26.6–33.0)
MCHC: 34 g/dL (ref 31.5–35.7)
MCV: 96 fL (ref 79–97)
MONOCYTES: 9 %
Monocytes Absolute: 0.6 10*3/uL (ref 0.1–0.9)
NEUTROS ABS: 2.9 10*3/uL (ref 1.4–7.0)
Neutrophils: 47 %
PLATELETS: 214 10*3/uL (ref 150–379)
RBC: 4.36 x10E6/uL (ref 3.77–5.28)
RDW: 14.1 % (ref 12.3–15.4)
WBC: 6.4 10*3/uL (ref 3.4–10.8)

## 2015-06-21 LAB — COMPREHENSIVE METABOLIC PANEL
ALBUMIN: 4.3 g/dL (ref 3.5–5.5)
ALK PHOS: 73 IU/L (ref 39–117)
ALT: 13 IU/L (ref 0–32)
AST: 17 IU/L (ref 0–40)
Albumin/Globulin Ratio: 1.8 (ref 1.1–2.5)
BUN / CREAT RATIO: 25 — AB (ref 9–23)
BUN: 18 mg/dL (ref 6–24)
Bilirubin Total: 0.3 mg/dL (ref 0.0–1.2)
CO2: 25 mmol/L (ref 18–29)
CREATININE: 0.71 mg/dL (ref 0.57–1.00)
Calcium: 9.5 mg/dL (ref 8.7–10.2)
Chloride: 103 mmol/L (ref 97–106)
GFR calc Af Amer: 112 mL/min/{1.73_m2} (ref >59)
GFR calc non Af Amer: 98 mL/min/{1.73_m2} (ref >59)
GLUCOSE: 92 mg/dL (ref 65–99)
Globulin, Total: 2.4 g/dL (ref 1.5–4.5)
Potassium: 4.4 mmol/L (ref 3.5–5.2)
Sodium: 143 mmol/L (ref 136–144)
Total Protein: 6.7 g/dL (ref 6.0–8.5)

## 2015-06-21 LAB — LIPID PANEL
CHOLESTEROL TOTAL: 228 mg/dL — AB (ref 100–199)
Chol/HDL Ratio: 4.1 ratio units (ref 0.0–4.4)
HDL: 56 mg/dL (ref >39)
LDL Calculated: 112 mg/dL — ABNORMAL HIGH (ref 0–99)
TRIGLYCERIDES: 301 mg/dL — AB (ref 0–149)
VLDL CHOLESTEROL CAL: 60 mg/dL — AB (ref 5–40)

## 2015-06-23 ENCOUNTER — Telehealth: Payer: Self-pay

## 2015-06-23 MED ORDER — VITAMIN D (ERGOCALCIFEROL) 1.25 MG (50000 UNIT) PO CAPS: 50000.0000 [IU] | ORAL_CAPSULE | ORAL | Status: DC

## 2015-06-23 NOTE — Telephone Encounter (Signed)
Prescription for Vitamin D 50,000 units has been sent to [facility] [address] per Dr. Manuella Ghazi

## 2015-06-26 ENCOUNTER — Ambulatory Visit: Payer: BLUE CROSS/BLUE SHIELD | Admitting: Dietician

## 2015-07-12 ENCOUNTER — Ambulatory Visit
Admission: RE | Admit: 2015-07-12 | Discharge: 2015-07-12 | Disposition: A | Discharge status: Home / Self Care | Payer: BLUE CROSS/BLUE SHIELD | Source: Ambulatory Visit | Attending: Family Medicine | Admitting: Family Medicine

## 2015-07-12 ENCOUNTER — Ambulatory Visit (INDEPENDENT_AMBULATORY_CARE_PROVIDER_SITE_OTHER): Payer: BLUE CROSS/BLUE SHIELD | Admitting: Family Medicine

## 2015-07-12 ENCOUNTER — Encounter: Payer: Self-pay | Admitting: Family Medicine

## 2015-07-12 VITALS — BP 110/66 | HR 111 | Temp 98.2°F | Resp 18 | Ht 62.0 in | Wt 228.7 lb

## 2015-07-12 DIAGNOSIS — M79641 Pain in right hand: Secondary | ICD-10-CM | POA: Diagnosis not present

## 2015-07-12 DIAGNOSIS — E782 Mixed hyperlipidemia: Secondary | ICD-10-CM | POA: Diagnosis not present

## 2015-07-12 DIAGNOSIS — M25551 Pain in right hip: Secondary | ICD-10-CM | POA: Insufficient documentation

## 2015-07-12 DIAGNOSIS — M545 Low back pain, unspecified: Secondary | ICD-10-CM

## 2015-07-12 DIAGNOSIS — M25542 Pain in joints of left hand: Principal | ICD-10-CM

## 2015-07-12 DIAGNOSIS — J069 Acute upper respiratory infection, unspecified: Secondary | ICD-10-CM

## 2015-07-12 DIAGNOSIS — M19042 Primary osteoarthritis, left hand: Secondary | ICD-10-CM | POA: Insufficient documentation

## 2015-07-12 DIAGNOSIS — M25552 Pain in left hip: Secondary | ICD-10-CM | POA: Diagnosis not present

## 2015-07-12 DIAGNOSIS — M51362 Other intervertebral disc degeneration, lumbar region with discogenic back pain and lower extremity pain: Secondary | ICD-10-CM | POA: Insufficient documentation

## 2015-07-12 DIAGNOSIS — M25559 Pain in unspecified hip: Secondary | ICD-10-CM | POA: Insufficient documentation

## 2015-07-12 DIAGNOSIS — M25541 Pain in joints of right hand: Secondary | ICD-10-CM | POA: Diagnosis not present

## 2015-07-12 DIAGNOSIS — M79642 Pain in left hand: Secondary | ICD-10-CM | POA: Diagnosis present

## 2015-07-12 MED ORDER — BENZONATATE 200 MG PO CAPS: 200.0000 mg | ORAL_CAPSULE | Freq: Three times a day (TID) | ORAL | Status: DC | PRN

## 2015-07-12 MED ORDER — ICOSAPENT ETHYL 1 G PO CAPS: 2.0000 g | ORAL_CAPSULE | Freq: Two times a day (BID) | ORAL | Status: DC

## 2015-07-12 MED ORDER — AZITHROMYCIN 250 MG PO TABS: ORAL_TABLET | ORAL | Status: DC

## 2015-07-12 NOTE — Progress Notes (Signed)
Name: Stacey Nichols   MRN: [id number]    DOB: 24-Sep-1960   Date:07/12/2015       Progress Note  Subjective  Chief Complaint  Chief Complaint  Patient presents with  . Annual Exam    CPE    Hyperlipidemia This is a chronic problem. The problem is uncontrolled. Recent lipid tests were reviewed and are high. Exacerbating diseases include obesity. Pertinent negatives include no chest pain, leg pain, myalgias or shortness of breath. She is currently on no antihyperlipidemic treatment.  Sore Throat  This is a new problem. Episode onset: 6 ays ago, started having symptoms of sore thraot, runny nose, started taking Nyquil, which improved her symptoms, but last night  her symptoms came back. Associated symptoms include coughing. Pertinent negatives include no congestion (had chest congestion last week), ear pain, headaches or shortness of breath. Treatments tried: Has been taking Nyquil and Dayquil.   Arthralgia Pt. Complains of progressively worsening pain in her joints. She reports morning and night time stiffness in her hands ( includes all finger joints), right and left hips and lower back pain. Pt. Reports pain worse with activity.  Past Medical History  Diagnosis Date  . Cancer ([place])     leukemia  . Sleep apnea   . High triglycerides   . H/O Bell's palsy   . Stroke [facility])     "mini stroke-caused bells palsy for 1 month"  . Pneumonia     hx of   . Depression   . Anxiety   . GERD (gastroesophageal reflux disease)   . H/O hiatal hernia   . Headache     occasional  . Arthritis     severe  . Anemia     hx of  . History of cancer chemotherapy     Past Surgical History  Procedure Laterality Date  . Laparoscopic gastric banding  2000    performed in [place]  . Tubal ligation  1996  . Abdominal hysterectomy  2001  . Upper gi endoscopy N/A 04/26/2014    Procedure: UPPER GI ENDOSCOPY;  Surgeon: Pedro Earls, MD;  Location: [facility];  Service: General;  Laterality: N/A;     Family History  Problem Relation Age of Onset  . Heart disease Father     Social History   Social History  . Marital Status: Married    Spouse Name: N/A  . Number of Children: N/A  . Years of Education: N/A   Occupational History  . Not on file.   Social History Main Topics  . Smoking status: Former Smoker -- 30 years    Types: Cigarettes    Quit date: 07/15/2005  . Smokeless tobacco: Never Used  . Alcohol Use: Yes     Comment: occasional  . Drug Use: No  . Sexual Activity: Not on file   Other Topics Concern  . Not on file   Social History Narrative     Current outpatient prescriptions:  .  azelastine (OPTIVAR) 0.05 % ophthalmic solution, PLACE 1 DROP INTO AFFECTED EYE TWICE A DAY, Disp: , Rfl: 3 .  calcium carbonate (OS-CAL - DOSED IN MG OF ELEMENTAL CALCIUM) 1250 (500 Ca) MG tablet, Take by mouth., Disp: , Rfl:  .  carbamazepine (TEGRETOL XR) 100 MG 12 hr tablet, Take 100 mg by mouth 2 (two) times daily., Disp: , Rfl:  .  CARBATROL 100 MG 12 hr capsule, Take 2 tablets by mouth at bedtime., Disp: , Rfl: 3 .  Cyanocobalamin (VITAMIN B-12)  1000 MCG/15ML LIQD, Take 500 mcg by mouth once a week., Disp: , Rfl:  .  EPIPEN 2-PAK 0.3 MG/0.3ML SOAJ injection, USE AS DIRECTED AS NEEDED, Disp: , Rfl: 1 .  fluticasone (FLONASE) 50 MCG/ACT nasal spray, Place 1-2 sprays into both nostrils 3 (three) times a week. , Disp: , Rfl:  .  ibuprofen (ADVIL,MOTRIN) 200 MG tablet, Take 600-800 mg by mouth once as needed for mild pain or moderate pain., Disp: , Rfl:  .  Iron TABS, Take 1 tablet by mouth daily., Disp: , Rfl:  .  mometasone (NASONEX) 50 MCG/ACT nasal spray, Place 2 sprays into the nose daily as needed (allergies.). , Disp: , Rfl:  .  montelukast (SINGULAIR) 10 MG tablet, Take 10 mg by mouth at bedtime as needed (allergies). , Disp: , Rfl:  .  Multiple Vitamins-Minerals (MULTI COMPLETE PO), Take 1 tablet by mouth daily., Disp: , Rfl:  .  Pediatric Multiple Vitamins (CHEWABLE  MULTIPLE VITAMINS PO), Take 1 tablet by mouth daily., Disp: , Rfl:  .  Vitamin D, Ergocalciferol, (DRISDOL) 50000 units CAPS capsule, Take 50,000 Units by mouth once a week., Disp: , Rfl:   Allergies  Allergen Reactions  . Latex Other (See Comments)  . Tape Rash    [Rash] rash - paper tape OK     Review of Systems  Constitutional: Negative for fever, chills and malaise/fatigue.  HENT: Positive for sore throat. Negative for congestion (had chest congestion last week) and ear pain.   Respiratory: Positive for cough. Negative for shortness of breath.   Cardiovascular: Negative for chest pain.  Musculoskeletal: Negative for myalgias.  Neurological: Negative for headaches.    Objective  Filed Vitals:   07/12/15 1133  BP: 110/66  Pulse: 111  Temp: 98.2 F (36.8 C)  TempSrc: Oral  Resp: 18  Height: 5\' 2"  (1.575 m)  Weight: 228 lb 11.2 oz (103.738 kg)  SpO2: 95%    Physical Exam  Constitutional: She is oriented to person, place, and time and well-developed, well-nourished, and in no distress.  HENT:  Head: Normocephalic and atraumatic.  Cardiovascular: Normal rate and regular rhythm.   Pulmonary/Chest: Effort normal and breath sounds normal.  Neurological: She is alert and oriented to person, place, and time.  Nursing note and vitals reviewed.     Recent Results (from the past 2160 hour(s))  Lipid Profile     Status: Abnormal   Collection Time: 06/20/15 11:41 AM  Result Value Ref Range   Cholesterol, Total 228 (H) 100 - 199 mg/dL   Triglycerides 301 (H) 0 - 149 mg/dL   HDL 56 >39 mg/dL   VLDL Cholesterol Cal 60 (H) 5 - 40 mg/dL   LDL Calculated 112 (H) 0 - 99 mg/dL   Chol/HDL Ratio 4.1 0.0 - 4.4 ratio units    Comment:                                   T. Chol/HDL Ratio                                             Men  Women                               1/2 Avg.Risk  3.4    3.3                                   Avg.Risk  5.0    4.4                                 2X Avg.Risk  9.6    7.1                                3X Avg.Risk 23.4   11.0   Vitamin D (25 hydroxy)     Status: Abnormal   Collection Time: 06/20/15 11:41 AM  Result Value Ref Range   Vit D, 25-Hydroxy 21.6 (L) 30.0 - 100.0 ng/mL    Comment: Vitamin D deficiency has been defined by the [place] practice guideline as a level of serum 25-OH vitamin D less than 20 ng/mL (1,2). The Endocrine Society went on to further define vitamin D insufficiency as a level between 21 and 29 ng/mL (2). 1. IOM (Institute of Medicine). 2010. Dietary reference    intakes for calcium and D. [place]: The    [employer]. 2. Holick MF, Binkley , Bischoff-Ferrari HA, et al.    Evaluation, treatment, and prevention of vitamin D    deficiency: an Endocrine Society clinical practice    guideline. JCEM. 2011 Jul; 96(7):1911-30.   CBC with Differential     Status: None   Collection Time: 06/20/15 11:41 AM  Result Value Ref Range   WBC 6.4 3.4 - 10.8 x10E3/uL   RBC 4.36 3.77 - 5.28 x10E6/uL   Hemoglobin 14.2 11.1 - 15.9 g/dL   Hematocrit 41.8 34.0 - 46.6 %   MCV 96 79 - 97 fL   MCH 32.6 26.6 - 33.0 pg   MCHC 34.0 31.5 - 35.7 g/dL   RDW 14.1 12.3 - 15.4 %   Platelets 214 150 - 379 x10E3/uL   Neutrophils 47 %   Lymphs 41 %   Monocytes 9 %   Eos 3 %   Basos 0 %   Neutrophils Absolute 2.9 1.4 - 7.0 x10E3/uL   Lymphocytes Absolute 2.6 0.7 - 3.1 x10E3/uL   Monocytes Absolute 0.6 0.1 - 0.9 x10E3/uL   EOS (ABSOLUTE) 0.2 0.0 - 0.4 x10E3/uL   Basophils Absolute 0.0 0.0 - 0.2 x10E3/uL   Immature Granulocytes 0 %   Immature Grans (Abs) 0.0 0.0 - 0.1 x10E3/uL  Comprehensive Metabolic Panel (CMET)     Status: Abnormal   Collection Time: 06/20/15 11:41 AM  Result Value Ref Range   Glucose 92 65 - 99 mg/dL   BUN 18 6 - 24 mg/dL   Creatinine, Ser 0.71 0.57 - 1.00 mg/dL   GFR calc non Af Amer 98 >59 mL/min/1.73   GFR calc Af Amer 112 >59 mL/min/1.73    BUN/Creatinine Ratio 25 (H) 9 - 23   Sodium 143 136 - 144 mmol/L    Comment: **Effective June 26, 2015 the reference interval**   for Sodium, Serum will be changing to:  134 - 144    Potassium 4.4 3.5 - 5.2 mmol/L   Chloride 103 97 - 106 mmol/L    Comment: **Effective June 26, 2015 the reference interval**   for Chloride, Serum will be changing to:                                              96 - 106    CO2 25 18 - 29 mmol/L   Calcium 9.5 8.7 - 10.2 mg/dL   Total Protein 6.7 6.0 - 8.5 g/dL   Albumin 4.3 3.5 - 5.5 g/dL   Globulin, Total 2.4 1.5 - 4.5 g/dL   Albumin/Globulin Ratio 1.8 1.1 - 2.5   Bilirubin Total 0.3 0.0 - 1.2 mg/dL   Alkaline Phosphatase 73 39 - 117 IU/L   AST 17 0 - 40 IU/L   ALT 13 0 - 32 IU/L     Assessment & Plan  1. Arthralgia of hands, bilateral  - DG Hand Complete Right; Future - DG Hand Complete Left; Future  2. Pain of both hip joints  - DG HIPS BILAT WITH PELVIS 3-4 VIEWS; Future  3. Midline low back pain without sciatica  - DG Lumbar Spine Complete; Future  4. Upper respiratory infection  - benzonatate (TESSALON) 200 MG capsule; Take 1 capsule (200 mg total) by mouth 3 (three) times daily as needed for cough.  Dispense: 20 capsule; Refill: 0 - azithromycin (ZITHROMAX Z-PAK) 250 MG tablet; 2 tabs po x day 1, then 1 tab po q day x 4 days  Dispense: 6 each; Refill: 0  5. Hypercholesterolemia with hypertriglyceridemia We will start on Vascepa for elevated triglycerides. - Icosapent Ethyl 1 g CAPS; Take 2 g by mouth 2 (two) times daily after a meal.  Dispense: 360 capsule; Refill: 0   Tayron Hunnell [facility] 07/12/2015 11:49 AM

## 2015-07-24 ENCOUNTER — Ambulatory Visit: Payer: BLUE CROSS/BLUE SHIELD | Admitting: Dietician

## 2015-08-10 ENCOUNTER — Encounter (INDEPENDENT_AMBULATORY_CARE_PROVIDER_SITE_OTHER): Payer: Self-pay

## 2015-08-10 ENCOUNTER — Encounter: Payer: Self-pay | Admitting: Cardiovascular Disease

## 2015-08-10 ENCOUNTER — Ambulatory Visit (INDEPENDENT_AMBULATORY_CARE_PROVIDER_SITE_OTHER): Payer: BLUE CROSS/BLUE SHIELD | Admitting: Cardiovascular Disease

## 2015-08-10 VITALS — BP 104/72 | HR 72 | Ht 62.0 in | Wt 227.2 lb

## 2015-08-10 DIAGNOSIS — E785 Hyperlipidemia, unspecified: Secondary | ICD-10-CM

## 2015-08-10 DIAGNOSIS — G4733 Obstructive sleep apnea (adult) (pediatric): Secondary | ICD-10-CM

## 2015-08-10 DIAGNOSIS — R079 Chest pain, unspecified: Secondary | ICD-10-CM | POA: Diagnosis not present

## 2015-08-10 DIAGNOSIS — R42 Dizziness and giddiness: Secondary | ICD-10-CM

## 2015-08-10 NOTE — Patient Instructions (Addendum)
You are doing well. No medication changes were made.  We will schedule a CT coronary calcium score for chest pain, smoker. $150, insurance does not cover.  Appt: [place] Monday, Jan 30, 1:30.  Arrival time 1:15pm [place] [address]  [phone]    Please call us if you have new issues that need to be addressed before your next appt.  Your physician recommends that you schedule a follow-up appointment pending calcium score results.

## 2015-08-10 NOTE — Assessment & Plan Note (Signed)
We have recommended that she have the CT coronary calcium score first before starting medication. If score is 0, may not need a medication

## 2015-08-10 NOTE — Assessment & Plan Note (Signed)
We have encouraged continued exercise, careful diet management in an effort to lose weight. Dietary guide provided

## 2015-08-10 NOTE — Progress Notes (Signed)
Patient ID: Stacey Nichols, female    DOB: August 22, 1960, 55 y.o.   MRN: [id number]  HPI Comments:  Stacey Nichols is a very pleasant 55 year old woman with obesity , long history of smoking from age 60 until 2 months ago,  Hyperlipidemia , obstructive sleep apnea who does not wear CPAP,  Who presents for evaluation of chest pain.  She is referred by Dr. 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  Daughter reports there was stress/anxiety at the time.  Symptoms seem to resolve without intervention, no significant workup at that time.   Symptoms have recurred about one month ago.  Seem to present when she is laying down, standing up on her left side. Improved with change in position.  The worst position is laying down, ST position is standing up or sitting up.  She does not appreciate pain on palpation.  she is able to exercise for 20 up to 40 minutes 3 times per week without symptoms of chest pain.  also has some arm numbness that comes and goes.  daughter reports that she has been under some stress recently  She does report having some lightheadedness when she is riding the bike, often feels like she is going to lean to one side. Symptoms resolve relatively quickly. She does not have symptoms at other times  blood pressure is low  She is not sleeping well, Sleeps in a recliner, has to have her arms in a certain position, very restless, very tired when she wakes up   review of lab work shows total cholesterol 230.    EKG on today's visit shows normal sinus rhythm with rate 73 beats minute, no significant ST or T-wave changes      Allergies  Allergen Reactions  . Latex Other (See Comments)  . Tape Rash    [Rash] rash - paper tape OK    Current Outpatient Prescriptions on File Prior to Visit  Medication Sig Dispense Refill  . azelastine (OPTIVAR) 0.05 % ophthalmic solution PLACE 1 DROP  INTO AFFECTED EYE TWICE A DAY  3  . benzonatate (TESSALON) 200 MG capsule Take 1 capsule (200 mg total) by mouth 3 (three) times daily as needed for cough. 20 capsule 0  . calcium carbonate (OS-CAL - DOSED IN MG OF ELEMENTAL CALCIUM) 1250 (500 Ca) MG tablet Take by mouth.    . CARBATROL 100 MG 12 hr capsule Take 2 tablets by mouth at bedtime.  3  . Cyanocobalamin (VITAMIN B-12) 1000 MCG/15ML LIQD Take 500 mcg by mouth once a week.    . EPIPEN 2-PAK 0.3 MG/0.3ML SOAJ injection USE AS DIRECTED AS NEEDED  1  . fluticasone (FLONASE) 50 MCG/ACT nasal spray Place 1-2 sprays into both nostrils 3 (three) times a week.     Marland Kitchen ibuprofen (ADVIL,MOTRIN) 200 MG tablet Take 600-800 mg by mouth once as needed for mild pain or moderate pain.    Vanessa Kick Ethyl 1 g CAPS Take 2 g by mouth 2 (two) times daily after a meal. 360 capsule 0  . Iron TABS Take 1 tablet by mouth daily.    . mometasone (NASONEX) 50 MCG/ACT nasal spray Place 2 sprays into the nose daily as needed (allergies.).     Marland Kitchen montelukast (SINGULAIR) 10 MG tablet Take 10 mg by mouth at bedtime as needed (allergies).     . Multiple Vitamins-Minerals (MULTI  COMPLETE PO) Take 1 tablet by mouth daily.    . Pediatric Multiple Vitamins (CHEWABLE MULTIPLE VITAMINS PO) Take 1 tablet by mouth daily.    . Vitamin D, Ergocalciferol, (DRISDOL) 50000 units CAPS capsule Take 50,000 Units by mouth once a week.     No current facility-administered medications on file prior to visit.    Past Medical History  Diagnosis Date  . Cancer ([place])     leukemia  . Sleep apnea   . High triglycerides   . H/O Bell's palsy   . Stroke [facility])     "mini stroke-caused bells palsy for 1 month"  . Pneumonia     hx of   . Depression   . Anxiety   . GERD (gastroesophageal reflux disease)   . H/O hiatal hernia   . Headache     occasional  . Arthritis     severe  . Anemia     hx of  . History of cancer chemotherapy     Past Surgical History  Procedure Laterality Date   . Laparoscopic gastric banding  2000    performed in [place]  . Tubal ligation  1996  . Abdominal hysterectomy  2001  . Upper gi endoscopy N/A 04/26/2014    Procedure: UPPER GI ENDOSCOPY;  Surgeon: Pedro Earls, MD;  Location: [facility];  Service: General;  Laterality: N/A;    Social History  reports that she quit smoking about 10 years ago. Her smoking use included Cigarettes. She quit after 30 years of use. She has never used smokeless tobacco. She reports that she drinks alcohol. She reports that she does not use illicit drugs.  Family History family history includes Heart disease in her father.    Review of Systems  Constitutional: Positive for fatigue.  Respiratory: Positive for chest tightness.   Cardiovascular: Positive for chest pain.  Gastrointestinal: Negative.   Musculoskeletal: Negative.   Neurological: Positive for numbness.  Hematological: Negative.   Psychiatric/Behavioral: Negative.   All other systems reviewed and are negative.   BP 104/72 mmHg  Pulse 72  Ht 5\' 2"  (1.575 m)  Wt 227 lb 4 oz (103.08 kg)  BMI 41.55 kg/m2  Physical Exam  Constitutional: She is oriented to person, place, and time. She appears well-developed and well-nourished.  Obese  HENT:  Head: Normocephalic.  Nose: Nose normal.  Mouth/Throat: Oropharynx is clear and moist.  Eyes: Conjunctivae are normal. Pupils are equal, round, and reactive to light.  Neck: Normal range of motion. Neck supple. No JVD present.  Cardiovascular: Normal rate, regular rhythm, normal heart sounds and intact distal pulses.  Exam reveals no gallop and no friction rub.   No murmur heard. Pulmonary/Chest: Effort normal and breath sounds normal. No respiratory distress. She has no wheezes. She has no rales. She exhibits no tenderness.  Abdominal: Soft. Bowel sounds are normal. She exhibits no distension. There is no tenderness.  Musculoskeletal: Normal range of motion. She exhibits no edema or tenderness.   Lymphadenopathy:    She has no cervical adenopathy.  Neurological: She is alert and oriented to person, place, and time. Coordination normal.  Skin: Skin is warm and dry. No rash noted. No erythema.  Psychiatric: She has a normal mood and affect. Her behavior is normal. Judgment and thought content normal.  Vitals reviewed.

## 2015-08-10 NOTE — Assessment & Plan Note (Addendum)
Atypical type chest pain, typically on the left side, positional in nature, presenting at rest over the past month. Symptoms do not get worse with exertion, she has been exercising 3 days per week on a bicycle with no problems. She does have risk factors including long smoking history, hyperlipidemia.  We have discussed the various treatment options with her. These include treadmill, treadmill Myoview, as well as other kinds of stress testing. Also discussed CT coronary calcium scoring. As her symptoms are atypical, less likely angina, she would like CT coronary calcium score for risk stratification. If her score 0, less urgency to start cholesterol medication. If the score is elevated, this would encourage [place] to start a cholesterol medication with aggressive lipid control.   Translator was used on today's visit for all discussion  Total encounter time more than 60 minutes  Greater than 50% was spent in counseling and coordination of care with the patient

## 2015-08-10 NOTE — Assessment & Plan Note (Signed)
She does not use her CPAP. Sleeping poorly, significant fatigue in the daytime, also has stress at home

## 2015-08-10 NOTE — Assessment & Plan Note (Signed)
Rare dizziness while she is biking, feels like she is going to fall to one side on the bike. She does have borderline low blood pressure, encouraged her to stay hydrated. Likely not a cardiac issue

## 2015-08-14 ENCOUNTER — Ambulatory Visit (INDEPENDENT_AMBULATORY_CARE_PROVIDER_SITE_OTHER)
Admission: RE | Admit: 2015-08-14 | Discharge: 2015-08-14 | Disposition: A | Discharge status: Home / Self Care | Payer: Self-pay | Source: Ambulatory Visit | Attending: Cardiovascular Disease | Admitting: Cardiovascular Disease

## 2015-08-14 DIAGNOSIS — R079 Chest pain, unspecified: Secondary | ICD-10-CM

## 2015-08-15 ENCOUNTER — Encounter: Payer: Self-pay | Admitting: Dietician

## 2015-08-15 ENCOUNTER — Encounter: Payer: BLUE CROSS/BLUE SHIELD | Attending: Surgery | Admitting: Dietician

## 2015-08-15 DIAGNOSIS — Z713 Dietary counseling and surveillance: Secondary | ICD-10-CM | POA: Insufficient documentation

## 2015-08-15 DIAGNOSIS — Z6841 Body Mass Index (BMI) 40.0 and over, adult: Secondary | ICD-10-CM | POA: Insufficient documentation

## 2015-08-15 NOTE — Progress Notes (Signed)
  Follow-up visit:  15 months Post-Operative Gastric sleeve Surgery  Medical Nutrition Therapy:  Appt start time: [id number]  End time: [id number]  Primary concerns today: Post-operative Bariatric Surgery Nutrition Management.  Appointment was translated by [place] interpreter, Neoma Laming, via [employer].  Rosalind returns today having gained 7 lbs. She is disappointed that she has gained weight. She attributes weight gain to holidays and indulging in more food. However, she reports she has been doing more exercise: 45 minutes on bicycle 5 days a week for a month and a half. She has been having a lot of fruit and bread. Having heartburn often.   Surgery date: Gastric sleeve Surgery type: 04/26/2014 Start weight at [facility]: 255.5 lbs on 03/01/14 Weight today: 231.5 lbs Weight change: 7 lbs gain Total weight lost: 14 lbs   TANITA  BODY COMP RESULTS  04/07/14 05/13/14 06/14/14 08/10/14 11/23/14 03/13/15 05/15/15 08/15/15   BMI (kg/m^2) 47.9 43.9 42.2 40.3 39.7 39.7 41.1 42.3   Fat Mass (lbs) 132.5 100 113 104.5 100 102 107.5 116   Fat Free Mass (lbs) 129.5 140 117.5 116 117 115 117 115.5   Total Body Water (lbs) 95 102.5 86 85 85.5 84 85.5 84.5    Preferred Learning Style:   No preference indicated   Learning Readiness:   Ready  24-hr recall: B (AM): protein shake OR coffee and egg with vegetables and saltine crackers  Snk (AM):    L (PM): vegetable soup or vegetables and small amount of chicken or fish Snk (PM):  D (PM): edamame, fruit Snk (PM): sugar free popsicles  Fluid intake: water and decaf coffee with cream and Splenda, (48 oz water) Estimated total protein intake: 80+ grams  Medications: see list Supplementation: taking, sometimes forgets calcium  Drinking while eating: sometimes sips Hair loss: slowing down, taking Biotin Carbonated beverages: sips N/V/D/C: constipation Dumping syndrome: none  Recent physical activity:  Walking 3x a week  Progress Towards Goal(s):  In  progress.  Handouts provided: Support group fliers  Nutritional Diagnosis:  [place]-3.3 Overweight/obesity related to past poor dietary habits and physical inactivity as evidenced by patient w/ recent gastric sleeve surgery following dietary guidelines for continued weight loss.     Intervention:  Nutrition counseling provided.  Teaching Method Utilized:  Visual Auditory Hands on  Barriers to learning/adherence to lifestyle change: none  Demonstrated degree of understanding via:  Teach Back   Monitoring/Evaluation:  Dietary intake, exercise, and body weight. Follow up in 4 weeks for 16 month post-op visit.

## 2015-08-15 NOTE — Patient Instructions (Addendum)
-  Have iron, vitamin A, and vitamin B1 (thiamin) labs checked -Limit carbohydrates to 15 grams per meal  -Have fruit no more than 1x a day -Find something to keep your hands busy to avoid snacking at night -Try Citracal Petites  -Try following the pre op diet again  -3 oz lean protein per meal: chicken, lean cuts of beef and pork, fish, eggs, deli meat, low fat 2% cheese -Non starchy vegetables (any veggie that is NOT corn, peas, or potatoes) -Limit carbohydrates to 15 grams or less per meal (1/2 cup)  -Eat meats first, then vegetables, then starch -Stick to mainly water and coffee with artificial sweetener  -Increase water to 64 oz per day -1st goal: 227 lbs -Avoid buying more bread and other trigger foods -Continue exercise routine! -Eat protein first, then vegetables - have 1 serving of fruit per day -Talk with surgeon about heartburn and constipation meds  Surgery date: Gastric sleeve Surgery type: 04/26/2014 Start weight at [facility]: 255.5 lbs on 03/01/14 Weight today: 231.5 lbs Weight change: 7 lbs gain Total weight lost: 14 lbs  TANITA  BODY COMP RESULTS  04/07/14 05/13/14 06/14/14 08/10/14 11/23/14 03/13/15 05/15/15 08/15/15   BMI (kg/m^2) 47.9 43.9 42.2 40.3 39.7 39.7 41.1 42.3   Fat Mass (lbs) 132.5 100 113 104.5 100 102 107.5 116   Fat Free Mass (lbs) 129.5 140 117.5 116 117 115 117 115.5   Total Body Water (lbs) 95 102.5 86 85 85.5 84 85.5 84.5

## 2015-08-17 ENCOUNTER — Other Ambulatory Visit: Payer: Self-pay

## 2015-08-17 MED ORDER — ROSUVASTATIN CALCIUM 5 MG PO TABS: 5.0000 mg | ORAL_TABLET | Freq: Every day | ORAL | Status: DC

## 2015-09-15 ENCOUNTER — Other Ambulatory Visit: Payer: Self-pay | Admitting: Family Medicine

## 2015-09-29 ENCOUNTER — Ambulatory Visit (INDEPENDENT_AMBULATORY_CARE_PROVIDER_SITE_OTHER): Payer: BLUE CROSS/BLUE SHIELD | Admitting: Family Medicine

## 2015-09-29 ENCOUNTER — Encounter: Payer: Self-pay | Admitting: Family Medicine

## 2015-09-29 VITALS — BP 110/68 | HR 97 | Temp 98.8°F | Resp 18 | Ht 62.0 in | Wt 237.8 lb

## 2015-09-29 DIAGNOSIS — Z23 Encounter for immunization: Secondary | ICD-10-CM

## 2015-09-29 DIAGNOSIS — M170 Bilateral primary osteoarthritis of knee: Secondary | ICD-10-CM

## 2015-09-29 DIAGNOSIS — J3089 Other allergic rhinitis: Secondary | ICD-10-CM | POA: Diagnosis not present

## 2015-09-29 MED ORDER — MOMETASONE FUROATE 50 MCG/ACT NA SUSP: 2.0000 | Freq: Every day | NASAL | Status: DC

## 2015-09-29 MED ORDER — MONTELUKAST SODIUM 10 MG PO TABS: 10.0000 mg | ORAL_TABLET | Freq: Every day | ORAL | Status: DC

## 2015-09-29 MED ORDER — MELOXICAM 15 MG PO TABS: 15.0000 mg | ORAL_TABLET | Freq: Every day | ORAL | Status: DC

## 2015-09-29 MED ORDER — LEVOCETIRIZINE DIHYDROCHLORIDE 5 MG PO TABS: 5.0000 mg | ORAL_TABLET | Freq: Every evening | ORAL | Status: DC

## 2015-09-29 NOTE — Progress Notes (Signed)
Name: Stacey Nichols   MRN: [id number]    DOB: 1960/09/06   Date:09/29/2015       Progress Note  Subjective  Chief Complaint  Chief Complaint  Patient presents with  . Medication Refill    HPI  Seasonal Allergic Rhinitis: Spring-time allergies (list of allergens reviewed). Consist mainly of trees, plants, peanuts, cats and dogs. Symptoms include runny nose, itchiness in throat, and watery eyes. She takes Singulair 10 mg daily, Xyzal every night and Mometasone nasal spray as needed.   Pt. Also has bilateral knee pain, X rays from June 2015 show significant arthritis. She has been taking Advil 800mg  one to two times daily and is wondering if its safe to take Advil in this dose for a longtime.  Past Medical History  Diagnosis Date  . Cancer ([place])     leukemia  . Sleep apnea   . High triglycerides   . H/O Bell's palsy   . Stroke [facility])     "mini stroke-caused bells palsy for 1 month"  . Pneumonia     hx of   . Depression   . Anxiety   . GERD (gastroesophageal reflux disease)   . H/O hiatal hernia   . Headache     occasional  . Arthritis     severe  . Anemia     hx of  . History of cancer chemotherapy     Past Surgical History  Procedure Laterality Date  . Laparoscopic gastric banding  2000    performed in [place]  . Tubal ligation  1996  . Abdominal hysterectomy  2001  . Upper gi endoscopy N/A 04/26/2014    Procedure: UPPER GI ENDOSCOPY;  Surgeon: Pedro Earls, MD;  Location: [facility];  Service: General;  Laterality: N/A;    Family History  Problem Relation Age of Onset  . Heart disease Father     Social History   Social History  . Marital Status: Married    Spouse Name: N/A  . Number of Children: N/A  . Years of Education: N/A   Occupational History  . Not on file.   Social History Main Topics  . Smoking status: Former Smoker -- 30 years    Types: Cigarettes    Quit date: 07/15/2005  . Smokeless tobacco: Never Used  . Alcohol Use: Yes     Comment:  occasional  . Drug Use: No  . Sexual Activity: Not on file   Other Topics Concern  . Not on file   Social History Narrative     Current outpatient prescriptions:  .  azelastine (OPTIVAR) 0.05 % ophthalmic solution, PLACE 1 DROP INTO AFFECTED EYE TWICE A DAY, Disp: , Rfl: 3 .  calcium carbonate (OS-CAL - DOSED IN MG OF ELEMENTAL CALCIUM) 1250 (500 Ca) MG tablet, Take by mouth., Disp: , Rfl:  .  CARBATROL 100 MG 12 hr capsule, Take 2 tablets by mouth at bedtime., Disp: , Rfl: 3 .  Cyanocobalamin (VITAMIN B-12) 1000 MCG/15ML LIQD, Take 500 mcg by mouth once a week., Disp: , Rfl:  .  EPIPEN 2-PAK 0.3 MG/0.3ML SOAJ injection, USE AS DIRECTED AS NEEDED, Disp: , Rfl: 1 .  fluticasone (FLONASE) 50 MCG/ACT nasal spray, Place 1-2 sprays into both nostrils 3 (three) times a week. , Disp: , Rfl:  .  ibuprofen (ADVIL,MOTRIN) 200 MG tablet, Take 600-800 mg by mouth once as needed for mild pain or moderate pain., Disp: , Rfl:  .  Icosapent Ethyl 1 g CAPS, Take  2 g by mouth 2 (two) times daily after a meal., Disp: 360 capsule, Rfl: 0 .  Iron TABS, Take 1 tablet by mouth daily., Disp: , Rfl:  .  levocetirizine (XYZAL) 5 MG tablet, Take 5 mg by mouth every evening., Disp: , Rfl:  .  mometasone (NASONEX) 50 MCG/ACT nasal spray, Place 2 sprays into the nose daily as needed (allergies.). , Disp: , Rfl:  .  montelukast (SINGULAIR) 10 MG tablet, Take 10 mg by mouth at bedtime as needed (allergies). , Disp: , Rfl:  .  Multiple Vitamins-Minerals (MULTI COMPLETE PO), Take 1 tablet by mouth daily., Disp: , Rfl:  .  Pediatric Multiple Vitamins (CHEWABLE MULTIPLE VITAMINS PO), Take 1 tablet by mouth daily., Disp: , Rfl:  .  rosuvastatin (CRESTOR) 5 MG tablet, Take 1 tablet (5 mg total) by mouth daily., Disp: 30 tablet, Rfl: 6 .  Vitamin D, Ergocalciferol, (DRISDOL) 50000 units CAPS capsule, Take 50,000 Units by mouth once a week., Disp: , Rfl:   Allergies  Allergen Reactions  . Latex Other (See Comments)  . Tape  Rash    [Rash] rash - paper tape OK     Review of Systems  Constitutional: Negative for fever and chills.  HENT: Positive for congestion.   Eyes: Negative for blurred vision and double vision.  Gastrointestinal: Positive for constipation. Negative for heartburn, nausea, vomiting, abdominal pain and diarrhea.  Neurological: Negative for headaches.      Objective  Filed Vitals:   09/29/15 1052  BP: 110/68  Pulse: 97  Temp: 98.8 F (37.1 C)  TempSrc: Oral  Resp: 18  Height: 5\' 2"  (1.575 m)  Weight: 237 lb 12.8 oz (107.865 kg)  SpO2: 96%    Physical Exam  Constitutional: She is well-developed, well-nourished, and in no distress.  HENT:  Head: Normocephalic and atraumatic.  Mouth/Throat: No posterior oropharyngeal erythema.  Nasal mucosal inflammation, turbinate hypertrophy.  Cardiovascular: Normal rate and regular rhythm.   Pulmonary/Chest: Effort normal and breath sounds normal.  Musculoskeletal:       Right knee: No tenderness found.       Left knee: She exhibits normal range of motion and no swelling. No tenderness found.  Crepitus in both knee upon extension, normal ROM, no tenderness, no swelling.  Nursing note and vitals reviewed.     Assessment & Plan  1. Need for immunization against influenza  - Flu Vaccine QUAD 36+ mos PF IM (Fluarix & Fluzone Quad PF)  2. Primary osteoarthritis of both knees DC Advil and will start on Mobic 15 mg daily. Follow-up in one month - meloxicam (MOBIC) 15 MG tablet; Take 1 tablet (15 mg total) by mouth daily.  Dispense: 30 tablet; Refill: 2  3. Environmental and seasonal allergies  - montelukast (SINGULAIR) 10 MG tablet; Take 1 tablet (10 mg total) by mouth daily.  Dispense: 90 tablet; Refill: 1 - mometasone (NASONEX) 50 MCG/ACT nasal spray; Place 2 sprays into the nose daily.  Dispense: 17 g; Refill: 2 - levocetirizine (XYZAL) 5 MG tablet; Take 1 tablet (5 mg total) by mouth every evening.  Dispense: 90 tablet; Refill:  0    Kaycee Mcgaugh [facility] 09/29/2015 11:11 AM

## 2015-10-06 ENCOUNTER — Other Ambulatory Visit: Payer: Self-pay | Admitting: Family Medicine

## 2015-10-06 NOTE — Telephone Encounter (Signed)
Medication has been refilled and sent to [facility] [address] 

## 2015-10-10 ENCOUNTER — Encounter: Payer: BLUE CROSS/BLUE SHIELD | Attending: Surgery | Admitting: Dietician

## 2015-10-10 ENCOUNTER — Encounter: Payer: Self-pay | Admitting: Dietician

## 2015-10-10 DIAGNOSIS — Z713 Dietary counseling and surveillance: Secondary | ICD-10-CM | POA: Insufficient documentation

## 2015-10-10 DIAGNOSIS — Z6841 Body Mass Index (BMI) 40.0 and over, adult: Secondary | ICD-10-CM | POA: Diagnosis not present

## 2015-10-10 NOTE — Patient Instructions (Addendum)
3 oz meat at a time and then have as many non starchy vegetables as you want Have 1 serving fruit or sweet potato per day (1/2 cup) Have fruit at one snack and protein at second snack (light cheese or yogurt)

## 2015-10-10 NOTE — Progress Notes (Signed)
  Follow-up visit:  1.5 years Post-Operative Gastric sleeve Surgery  Medical Nutrition Therapy:  Appt start time: 245  End time: 315  Primary concerns today: Post-operative Bariatric Surgery Nutrition Management.  Appointment was translated by Spanish interpreter, Stacey Nichols [phone]) from [employer].  Stacey Nichols returns today having gained another 10 lbs. On a new medication for cholesterol. Has been exercising more and eating 5-6x a day. She reports that she is surprised and concerned with weight gain. Can tolerate 8 oz meat+vegetables+1 cup of sweet potato at a time. Uses oil and lemon on salad. Taking 4 Tbsp per day Linseed oil per day for constipation.  Surgery date: Gastric sleeve Surgery type: 04/26/2014 Start weight at [facility]: 255.5 lbs on 03/01/14 Weight today: 240 lbs Weight change: 8.5 lbs gain Total weight lost: 15.5 lbs   TANITA  BODY COMP RESULTS  04/07/14 05/13/14 06/14/14 08/10/14 11/23/14 03/13/15 05/15/15 08/15/15   BMI (kg/m^2) 47.9 43.9 42.2 40.3 39.7 39.7 41.1 42.3   Fat Mass (lbs) 132.5 100 113 104.5 100 102 107.5 116   Fat Free Mass (lbs) 129.5 140 117.5 116 117 115 117 115.5   Total Body Water (lbs) 95 102.5 86 85 85.5 84 85.5 84.5    Preferred Learning Style:   No preference indicated   Learning Readiness:   Ready  24-hr recall: B (AM): egg, coffee with milk and artificial sweetener Snk (AM): fruit or protein shake  L (PM): salad and 8 oz chicken, sometimes dessert Snk (PM): fruit D (PM): salad and protein, 3-4 crackers Snk (PM): coffee or chicken   Fluid intake: water and decaf coffee with cream and Splenda, (48 oz water) Estimated total protein intake: 100+ grams  Medications: see list Supplementation: taking, sometimes forgets calcium  Drinking while eating: sometimes sips Hair loss: slowing down, taking Biotin Carbonated beverages: sips N/V/D/C: constipation Dumping syndrome: none  Recent physical activity:  30-60 minutes on bicycle;  walking for an hour every other day (2 days of rest per week)  Progress Towards Goal(s):  In progress.  Handouts provided: Support group fliers  Nutritional Diagnosis:  [place]-3.3 Overweight/obesity related to past poor dietary habits and physical inactivity as evidenced by patient w/ recent gastric sleeve surgery following dietary guidelines for continued weight loss.     Intervention:  Nutrition counseling provided. Goals: 3 oz meat at a time and then have as many non starchy vegetables as you want Have 1 serving fruit or sweet potato per day (1/2 cup) Have fruit at one snack and protein at second snack (light cheese or yogurt)  Teaching Method Utilized:  Visual Auditory Hands on  Barriers to learning/adherence to lifestyle change: none  Demonstrated degree of understanding via:  Teach Back   Monitoring/Evaluation:  Dietary intake, exercise, and body weight. Follow up in 4 weeks.

## 2015-11-09 ENCOUNTER — Ambulatory Visit: Payer: BLUE CROSS/BLUE SHIELD | Admitting: Dietician

## 2015-12-12 ENCOUNTER — Encounter: Payer: Self-pay | Admitting: Dietician

## 2015-12-12 ENCOUNTER — Encounter: Payer: BLUE CROSS/BLUE SHIELD | Attending: Surgery | Admitting: Dietician

## 2015-12-12 DIAGNOSIS — Z713 Dietary counseling and surveillance: Secondary | ICD-10-CM | POA: Diagnosis not present

## 2015-12-12 DIAGNOSIS — Z6841 Body Mass Index (BMI) 40.0 and over, adult: Secondary | ICD-10-CM | POA: Insufficient documentation

## 2015-12-12 NOTE — Progress Notes (Signed)
  Follow-up visit:  1.75 years Post-Operative Gastric sleeve Surgery  Medical Nutrition Therapy:  Appt start time: 1005  End time: 1040  Primary concerns today: Post-operative Bariatric Surgery Nutrition Management.  Appointment was translated by Spanish interpreter, Hassan Rowan [phone]) from [employer].  Stacey Nichols returns having lost about 1/2 a pound since last visit 2 months ago. She reports that she has been trying to "push herself a little bit more." Doing a group nutrition program in Spanish 1x a week. Logging foods daily and feels like this is helpful. Practicing portion control and has a plate with dividers for each food group. Working on avoiding starches and sugars. Still tempted by sweets. Also working with a Physiological scientist. Blood pressure has been high recently and she states she has never been on blood pressure medication in the past. Cholesterol medication was also recently increased.    Surgery date: Gastric sleeve Surgery type: 04/26/2014 Start weight at [facility]: 255.5 lbs on 03/01/14 Weight today: 239.4 lbs Weight change: 0.6 lbs loss Total weight lost: 16 lbs   TANITA  BODY COMP RESULTS  04/07/14 05/13/14 06/14/14 08/10/14 11/23/14 03/13/15 05/15/15 08/15/15 12/12/15   BMI (kg/m^2) 47.9 43.9 42.2 40.3 39.7 39.7 41.1 42.3 43.8   Fat Mass (lbs) 132.5 100 113 104.5 100 102 107.5 116 120.6   Fat Free Mass (lbs) 129.5 140 117.5 116 117 115 117 115.5 118.8   Total Body Water (lbs) 95 102.5 86 85 85.5 84 85.5 84.5 86.6    Preferred Learning Style:   No preference indicated   Learning Readiness:   Ready  24-hr recall: B (AM): egg, coffee with milk and artificial sweetener Snk (AM): fruit or protein shake  L (PM): salad and 8 oz chicken, sometimes dessert Snk (PM): fruit D (PM): salad and protein, 3-4 crackers Snk (PM): coffee or chicken   Fluid intake: water and decaf coffee with cream and Splenda, (48 oz water) Estimated total protein intake: 100+  grams  Medications: see list Supplementation: taking, sometimes forgets calcium  Drinking while eating: sometimes sips Hair loss: slowing down, taking Biotin Carbonated beverages: sips N/V/D/C: constipation Dumping syndrome: none  Recent physical activity:  Cardio 2x a week; bike 4-5x a week (300 minutes a week of exercise per patient)  Progress Towards Goal(s):  In progress.  Handouts provided: Support group fliers  Nutritional Diagnosis:  [place]-3.3 Overweight/obesity related to past poor dietary habits and physical inactivity as evidenced by patient w/ recent gastric sleeve surgery following dietary guidelines for continued weight loss.     Intervention:  Nutrition counseling provided. Goals: 3 oz meat at a time and then have as many non starchy vegetables as you want Have 1 serving fruit or sweet potato per day (1/2 cup) Have fruit at one snack and protein at second snack (light cheese or yogurt)  Teaching Method Utilized:  Visual Auditory Hands on  Barriers to learning/adherence to lifestyle change: none  Demonstrated degree of understanding via:  Teach Back   Monitoring/Evaluation:  Dietary intake, exercise, and body weight. Follow up in 4 weeks.

## 2015-12-12 NOTE — Patient Instructions (Addendum)
3 oz meat at a time and then have as many non starchy vegetables as you want Have 1 serving fruit or sweet potato per day (1/2 cup) Have fruit at one snack and protein at second snack (light cheese or yogurt)  Goals: 1. Continue exercise routine! 2. Keep working on portion control  3. Keep practicing making healthy choices (3-4 oz protein 1st, then vegetables). Keep limiting starches and sweets.     900-1,100 calories per day 70-90 grams protein per day 15 grams (or 1/2 cup) of carbohydrates at a time (about 60 grams total per day)

## 2015-12-25 ENCOUNTER — Other Ambulatory Visit: Payer: Self-pay | Admitting: Family Medicine

## 2015-12-26 LAB — HM DEXA SCAN

## 2016-01-01 ENCOUNTER — Ambulatory Visit: Payer: BLUE CROSS/BLUE SHIELD | Admitting: Family Medicine

## 2016-01-09 ENCOUNTER — Ambulatory Visit: Payer: BLUE CROSS/BLUE SHIELD | Admitting: Dietician

## 2016-01-12 ENCOUNTER — Ambulatory Visit: Payer: BLUE CROSS/BLUE SHIELD | Admitting: Family Medicine

## 2016-01-23 ENCOUNTER — Ambulatory Visit (INDEPENDENT_AMBULATORY_CARE_PROVIDER_SITE_OTHER): Payer: BLUE CROSS/BLUE SHIELD | Admitting: Family Medicine

## 2016-01-23 ENCOUNTER — Encounter: Payer: Self-pay | Admitting: Family Medicine

## 2016-01-23 VITALS — BP 118/71 | HR 97 | Temp 98.7°F | Resp 16 | Ht 62.0 in | Wt 240.9 lb

## 2016-01-23 DIAGNOSIS — E782 Mixed hyperlipidemia: Secondary | ICD-10-CM | POA: Diagnosis not present

## 2016-01-23 DIAGNOSIS — E559 Vitamin D deficiency, unspecified: Secondary | ICD-10-CM

## 2016-01-23 LAB — LIPID PANEL
CHOLESTEROL: 149 mg/dL (ref 125–200)
HDL: 55 mg/dL (ref >=46)
LDL Cholesterol: 53 mg/dL (ref <130)
TRIGLYCERIDES: 203 mg/dL — AB (ref <150)
Total CHOL/HDL Ratio: 2.7 Ratio (ref <=5.0)
VLDL: 41 mg/dL — ABNORMAL HIGH (ref <30)

## 2016-01-23 LAB — COMPLETE METABOLIC PANEL WITH GFR
ALBUMIN: 4.4 g/dL (ref 3.6–5.1)
ALK PHOS: 74 U/L (ref 33–130)
ALT: 17 U/L (ref 6–29)
AST: 19 U/L (ref 10–35)
BILIRUBIN TOTAL: 0.4 mg/dL (ref 0.2–1.2)
BUN: 16 mg/dL (ref 7–25)
CALCIUM: 9.3 mg/dL (ref 8.6–10.4)
CHLORIDE: 103 mmol/L (ref 98–110)
CO2: 27 mmol/L (ref 20–31)
CREATININE: 0.9 mg/dL (ref 0.50–1.05)
GFR, EST AFRICAN AMERICAN: 84 mL/min (ref >=60)
GFR, Est Non African American: 73 mL/min (ref >=60)
Glucose, Bld: 108 mg/dL — ABNORMAL HIGH (ref 65–99)
Potassium: 4.8 mmol/L (ref 3.5–5.3)
Sodium: 142 mmol/L (ref 135–146)
TOTAL PROTEIN: 6.8 g/dL (ref 6.1–8.1)

## 2016-01-23 MED ORDER — ROSUVASTATIN CALCIUM 5 MG PO TABS: 5.0000 mg | ORAL_TABLET | Freq: Every day | ORAL | Status: DC

## 2016-01-23 NOTE — Progress Notes (Signed)
Name: Stacey Nichols   MRN: [id number]    DOB: 1961-03-14   Date:01/23/2016       Progress Note  Subjective  Chief Complaint  Chief Complaint  Patient presents with  . Follow-up    3 mo    Hyperlipidemia This is a chronic problem. The problem is uncontrolled. Recent lipid tests were reviewed and are high. Pertinent negatives include no myalgias or shortness of breath. Current antihyperlipidemic treatment includes statins (Started on Rosuvastatin 5mg  at bedtime by Cardiology.).  She was prescribed Vascepa 2g twice daily but could not take it because of excessive burps and heartburn and consequently stopped taking it.  Vitamin D deficiency: Last level obtained in December 2016 was below normal at 21.6 ng/mL, took Vitamin D 50,000 units every week for 12 weeks. Now here to repeat levels.   Past Medical History  Diagnosis Date  . Cancer ([place])     leukemia  . Sleep apnea   . High triglycerides   . H/O Bell's palsy   . Stroke [facility])     "mini stroke-caused bells palsy for 1 month"  . Pneumonia     hx of   . Depression   . Anxiety   . GERD (gastroesophageal reflux disease)   . H/O hiatal hernia   . Headache     occasional  . Arthritis     severe  . Anemia     hx of  . History of cancer chemotherapy     Past Surgical History  Procedure Laterality Date  . Laparoscopic gastric banding  2000    performed in [place]  . Tubal ligation  1996  . Abdominal hysterectomy  2001  . Upper gi endoscopy N/A 04/26/2014    Procedure: UPPER GI ENDOSCOPY;  Surgeon: Pedro Earls, MD;  Location: [facility];  Service: General;  Laterality: N/A;    Family History  Problem Relation Age of Onset  . Heart disease Father     Social History   Social History  . Marital Status: Married    Spouse Name: N/A  . Number of Children: N/A  . Years of Education: N/A   Occupational History  . Not on file.   Social History Main Topics  . Smoking status: Former Smoker -- 30 years    Types:  Cigarettes    Quit date: 07/15/2005  . Smokeless tobacco: Never Used  . Alcohol Use: Yes     Comment: occasional  . Drug Use: No  . Sexual Activity: Not on file   Other Topics Concern  . Not on file   Social History Narrative     Current outpatient prescriptions:  .  azelastine (OPTIVAR) 0.05 % ophthalmic solution, PLACE 1 DROP INTO AFFECTED EYE TWICE A DAY, Disp: , Rfl: 3 .  calcium carbonate (OS-CAL - DOSED IN MG OF ELEMENTAL CALCIUM) 1250 (500 Ca) MG tablet, Take by mouth., Disp: , Rfl:  .  carbamazepine (TEGRETOL XR) 200 MG 12 hr tablet, , Disp: , Rfl: 2 .  CARBATROL 100 MG 12 hr capsule, Take 2 tablets by mouth at bedtime., Disp: , Rfl: 3 .  Cyanocobalamin (VITAMIN B-12) 1000 MCG/15ML LIQD, Take 500 mcg by mouth once a week., Disp: , Rfl:  .  EPIPEN 2-PAK 0.3 MG/0.3ML SOAJ injection, USE AS DIRECTED AS NEEDED, Disp: , Rfl: 1 .  fluticasone (FLONASE) 50 MCG/ACT nasal spray, Place 1-2 sprays into both nostrils 3 (three) times a week. , Disp: , Rfl:  .  Icosapent Ethyl (VASCEPA)  1 g CAPS, Take 2 g by mouth 2 (two) times daily after a meal., Disp: 360 capsule, Rfl: 0 .  Iron TABS, Take 1 tablet by mouth daily., Disp: , Rfl:  .  levocetirizine (XYZAL) 5 MG tablet, Take 1 tablet (5 mg total) by mouth every evening., Disp: 90 tablet, Rfl: 0 .  meloxicam (MOBIC) 15 MG tablet, TAKE 1 TABLET BY MOUTH EVERY DAY, Disp: 30 tablet, Rfl: 2 .  mometasone (NASONEX) 50 MCG/ACT nasal spray, Place 2 sprays into the nose daily., Disp: 17 g, Rfl: 2 .  montelukast (SINGULAIR) 10 MG tablet, Take 1 tablet (10 mg total) by mouth daily., Disp: 90 tablet, Rfl: 1 .  Multiple Vitamins-Minerals (MULTI COMPLETE PO), Take 1 tablet by mouth daily., Disp: , Rfl:  .  rosuvastatin (CRESTOR) 5 MG tablet, Take 1 tablet (5 mg total) by mouth daily., Disp: 30 tablet, Rfl: 6 .  Vitamin D, Ergocalciferol, (DRISDOL) 50000 units CAPS capsule, Take 50,000 Units by mouth once a week., Disp: , Rfl:   Allergies  Allergen  Reactions  . Latex Other (See Comments) and Itching  . Tape Rash    [Rash] rash - paper tape OK     Review of Systems  Constitutional: Negative for fever and malaise/fatigue.  Respiratory: Negative for shortness of breath.   Musculoskeletal: Positive for joint pain. Negative for myalgias and back pain.    Objective  Filed Vitals:   01/23/16 1005  BP: 118/71  Pulse: 97  Temp: 98.7 F (37.1 C)  TempSrc: Oral  Resp: 16  Height: 5\' 2"  (1.575 m)  Weight: 240 lb 14.4 oz (109.272 kg)  SpO2: 99%    Physical Exam  Constitutional: She is oriented to person, place, and time and well-developed, well-nourished, and in no distress.  HENT:  Head: Normocephalic and atraumatic.  Cardiovascular: Normal rate, regular rhythm, S1 normal, S2 normal and normal heart sounds.   No murmur heard. Pulmonary/Chest: Effort normal and breath sounds normal. No respiratory distress. She has no wheezes.  Abdominal: Soft. Bowel sounds are normal. There is no tenderness.  Musculoskeletal:       Right knee: Tenderness found.       Legs: Tenderness to palpation over left anterior tibia, no swelling.  Neurological: She is alert and oriented to person, place, and time.  Nursing note and vitals reviewed.      Assessment & Plan  1. Hypercholesterolemia with hypertriglyceridemia Continue on statin therapy, if triglycerides persistently elevated, consider a different triglyceride lowering agent. - COMPLETE METABOLIC PANEL WITH GFR - Lipid Profile - rosuvastatin (CRESTOR) 5 MG tablet; Take 1 tablet (5 mg total) by mouth at bedtime.  Dispense: 90 tablet; Refill: 1  2. Vitamin D deficiency  - Vitamin D (25 hydroxy)     Deke Tilghman [facility] 01/23/2016 10:25 AM

## 2016-01-24 ENCOUNTER — Other Ambulatory Visit: Payer: Self-pay | Admitting: *Deleted

## 2016-01-24 DIAGNOSIS — E782 Mixed hyperlipidemia: Secondary | ICD-10-CM

## 2016-01-24 LAB — VITAMIN D 25 HYDROXY (VIT D DEFICIENCY, FRACTURES): Vit D, 25-Hydroxy: 22 ng/mL — ABNORMAL LOW (ref 30–100)

## 2016-01-24 MED ORDER — ROSUVASTATIN CALCIUM 5 MG PO TABS: 5.0000 mg | ORAL_TABLET | Freq: Every day | ORAL | Status: DC

## 2016-01-24 NOTE — Telephone Encounter (Signed)
Requested Prescriptions   Signed Prescriptions Disp Refills  . rosuvastatin (CRESTOR) 5 MG tablet 90 tablet 1    Sig: Take 1 tablet (5 mg total) by mouth at bedtime.    Authorizing Provider: Minna Merritts    Ordering User: Britt Bottom

## 2016-01-26 ENCOUNTER — Telehealth: Payer: Self-pay | Admitting: Emergency Medicine

## 2016-02-06 ENCOUNTER — Ambulatory Visit: Payer: BLUE CROSS/BLUE SHIELD | Admitting: Dietician

## 2016-03-13 ENCOUNTER — Encounter: Payer: BLUE CROSS/BLUE SHIELD | Attending: Surgery | Admitting: Dietician

## 2016-03-13 DIAGNOSIS — Z6841 Body Mass Index (BMI) 40.0 and over, adult: Secondary | ICD-10-CM | POA: Diagnosis not present

## 2016-03-13 DIAGNOSIS — Z713 Dietary counseling and surveillance: Secondary | ICD-10-CM | POA: Insufficient documentation

## 2016-03-13 NOTE — Patient Instructions (Signed)
3 oz meat at a time and then have as many non starchy vegetables as you want Have 1 serving fruit or sweet potato per day (1/2 cup) Have fruit at one snack and protein at second snack (light cheese or yogurt)  Goals: 1. Continue exercise routine! 2. Keep working on portion control  3. Keep practicing making healthy choices (3-4 oz protein 1st, then vegetables). Keep limiting starches and sweets.     900-1,100 calories per day 70-90 grams protein per day 15 grams (or 1/2 cup) of carbohydrates at a time (about 60 grams total per day)

## 2016-03-13 NOTE — Progress Notes (Signed)
  Follow-up visit:  1.75 years Post-Operative Gastric sleeve Surgery  Medical Nutrition Therapy:  Appt start time: 945  End time: [id number]  Primary concerns today: Post-operative Bariatric Surgery Nutrition Management.  Appointment was translated by Spanish interpreter, Verdis Frederickson [phone]) from [employer] interpreting services.  Katrice returns having lost 3 pounds. She reports she has put in a lot of hard work and has been walking more (45-60 minutes 5x a week). Has been going to Weight Watchers in the last month to try to lose weight and feels like it is working.   Surgery date: Gastric sleeve Surgery type: 04/26/2014 Start weight at [facility]: 255.5 lbs on 03/01/14 Weight today: 239.4 lbs Weight change: 0.6 lbs loss Total weight lost: 16 lbs   TANITA  BODY COMP RESULTS  04/07/14 05/13/14 06/14/14 08/10/14 11/23/14 03/13/15 05/15/15 08/15/15 12/12/15 03/13/16   BMI (kg/m^2) 47.9 43.9 42.2 40.3 39.7 39.7 41.1 42.3 43.8 43.3   Fat Mass (lbs) 132.5 100 113 104.5 100 102 107.5 116 120.6 118.6   Fat Free Mass (lbs) 129.5 140 117.5 116 117 115 117 115.5 118.8 118   Total Body Water (lbs) 95 102.5 86 85 85.5 84 85.5 84.5 86.6 86    Preferred Learning Style:   No preference indicated   Learning Readiness:   Ready  24-hr recall: B (AM): 2 eggs with ham, coffee with milk and artificial sweetener Snk (AM):  L (12 PM): 4-6 oz of beef, fish, or chicken with vegetables, fruit Snk (PM):  D (PM): 3-4 oz of protein with peanut butter or avocado Snk (PM):   Also drinking a shake with fruit and protein in addition to 3 meals to meet "30 points for Weight Watchers."  Fluid intake: water and decaf coffee with cream and Splenda, (48 oz water) Estimated total protein intake: 100+ grams  Medications: see list Supplementation: taking, sometimes forgets calcium  Drinking while eating: sometimes sips Hair loss: slowing down, taking Biotin Carbonated beverages: sips N/V/D/C: constipation Dumping syndrome:  none  Recent physical activity:  Cardio 2x a week; bike 4-5x a week (300 minutes a week of exercise per patient)  Progress Towards Goal(s):  In progress.  Handouts provided: Support group fliers  Nutritional Diagnosis:  [place]-3.3 Overweight/obesity related to past poor dietary habits and physical inactivity as evidenced by patient w/ recent gastric sleeve surgery following dietary guidelines for continued weight loss.     Intervention:  Nutrition counseling provided. Goals: 3 oz meat at a time and then have as many non starchy vegetables as you want Have 1 serving fruit or sweet potato per day (1/2 cup) Have fruit at one snack and protein at second snack (light cheese or yogurt)  Advised patient that an additional shake may not be necessary to have in addition to 3 meals.   Teaching Method Utilized:  Visual Auditory Hands on  Barriers to learning/adherence to lifestyle change: none  Demonstrated degree of understanding via:  Teach Back   Monitoring/Evaluation:  Dietary intake, exercise, and body weight. Follow up in 4 weeks.

## 2016-03-14 ENCOUNTER — Encounter: Payer: Self-pay | Admitting: Dietician

## 2016-03-19 ENCOUNTER — Other Ambulatory Visit: Payer: Self-pay | Admitting: Family Medicine

## 2016-05-14 ENCOUNTER — Other Ambulatory Visit: Payer: Self-pay | Admitting: Family Medicine

## 2016-05-14 DIAGNOSIS — J3089 Other allergic rhinitis: Secondary | ICD-10-CM

## 2016-05-14 NOTE — Telephone Encounter (Signed)
Medication has been refilled and sent to [facility] [address] 

## 2016-05-27 ENCOUNTER — Ambulatory Visit (INDEPENDENT_AMBULATORY_CARE_PROVIDER_SITE_OTHER): Payer: BLUE CROSS/BLUE SHIELD | Admitting: Family Medicine

## 2016-05-27 ENCOUNTER — Encounter: Payer: Self-pay | Admitting: Family Medicine

## 2016-05-27 VITALS — BP 120/71 | HR 84 | Temp 98.6°F | Resp 17 | Ht 62.0 in | Wt 231.6 lb

## 2016-05-27 DIAGNOSIS — R7303 Prediabetes: Secondary | ICD-10-CM | POA: Diagnosis not present

## 2016-05-27 DIAGNOSIS — E1169 Type 2 diabetes mellitus with other specified complication: Secondary | ICD-10-CM | POA: Insufficient documentation

## 2016-05-27 DIAGNOSIS — E782 Mixed hyperlipidemia: Secondary | ICD-10-CM | POA: Diagnosis not present

## 2016-05-27 DIAGNOSIS — Z856 Personal history of leukemia: Secondary | ICD-10-CM | POA: Diagnosis not present

## 2016-05-27 DIAGNOSIS — M1711 Unilateral primary osteoarthritis, right knee: Secondary | ICD-10-CM

## 2016-05-27 DIAGNOSIS — Z23 Encounter for immunization: Secondary | ICD-10-CM

## 2016-05-27 LAB — POCT GLYCOSYLATED HEMOGLOBIN (HGB A1C): HEMOGLOBIN A1C: 6

## 2016-05-27 LAB — CBC WITH DIFFERENTIAL/PLATELET
BASOS PCT: 0 %
Basophils Absolute: 0 cells/uL (ref 0–200)
EOS ABS: 256 {cells}/uL (ref 15–500)
Eosinophils Relative: 4 %
HEMATOCRIT: 43.6 % (ref 35.0–45.0)
Hemoglobin: 14.6 g/dL (ref 11.7–15.5)
LYMPHS PCT: 36 %
Lymphs Abs: 2304 cells/uL (ref 850–3900)
MCH: 32.8 pg (ref 27.0–33.0)
MCHC: 33.5 g/dL (ref 32.0–36.0)
MCV: 98 fL (ref 80.0–100.0)
MONO ABS: 512 {cells}/uL (ref 200–950)
MONOS PCT: 8 %
MPV: 10.5 fL (ref 7.5–12.5)
NEUTROS ABS: 3328 {cells}/uL (ref 1500–7800)
Neutrophils Relative %: 52 %
PLATELETS: 175 10*3/uL (ref 140–400)
RBC: 4.45 MIL/uL (ref 3.80–5.10)
RDW: 14.1 % (ref 11.0–15.0)
WBC: 6.4 10*3/uL (ref 3.8–10.8)

## 2016-05-27 LAB — LIPID PANEL
CHOL/HDL RATIO: 3 ratio (ref <5.0)
Cholesterol: 161 mg/dL (ref <200)
HDL: 53 mg/dL (ref >50)
LDL Cholesterol: 67 mg/dL (ref <100)
TRIGLYCERIDES: 203 mg/dL — AB (ref <150)
VLDL: 41 mg/dL — ABNORMAL HIGH (ref <30)

## 2016-05-27 MED ORDER — ROSUVASTATIN CALCIUM 5 MG PO TABS: 5.0000 mg | ORAL_TABLET | Freq: Every day | ORAL | 1 refills | Status: DC

## 2016-05-27 NOTE — Progress Notes (Signed)
Name: Stacey Nichols   MRN: [id number]    DOB: Nov 27, 1960   Date:05/27/2016       Progress Note  Subjective  Chief Complaint  Chief Complaint  Patient presents with  . Follow-up    3 mo   . Medication Refill    Hyperlipidemia  This is a chronic problem. The problem is uncontrolled. Recent lipid tests were reviewed and are high. Pertinent negatives include no chest pain, leg pain, myalgias or shortness of breath. Current antihyperlipidemic treatment includes statins.  Arthritis  Presents for follow-up visit. She complains of pain. The symptoms have been worsening (would like to try Hyaluronic acid for knee pain from osteoarthritis). Affected locations include the right knee and left knee. Pertinent negatives include no fever or weight loss.   Pt. would also like to check her white blood cell count, has history of Leukemia, currently in remission,  She has fatigue all the time, no unintentional weight loss (she is trying to lose weight), no fevers or chills.  Past Medical History:  Diagnosis Date  . Anemia    hx of  . Anxiety   . Arthritis    severe  . Cancer ([place])    leukemia  . Depression   . GERD (gastroesophageal reflux disease)   . H/O Bell's palsy   . H/O hiatal hernia   . Headache    occasional  . High triglycerides   . History of cancer chemotherapy   . Pneumonia    hx of   . Sleep apnea   . Stroke [facility])    "mini stroke-caused bells palsy for 1 month"    Past Surgical History:  Procedure Laterality Date  . ABDOMINAL HYSTERECTOMY  2001  . LAPAROSCOPIC GASTRIC BANDING  2000   performed in [place]  . TUBAL LIGATION  1996  . UPPER GI ENDOSCOPY N/A 04/26/2014   Procedure: UPPER GI ENDOSCOPY;  Surgeon: Pedro Earls, MD;  Location: [facility];  Service: General;  Laterality: N/A;    Family History  Problem Relation Age of Onset  . Heart disease Father     Social History   Social History  . Marital status: Married    Spouse name: N/A  . Number of children:  N/A  . Years of education: N/A   Occupational History  . Not on file.   Social History Main Topics  . Smoking status: Former Smoker    Years: 30.00    Types: Cigarettes    Quit date: 07/15/2005  . Smokeless tobacco: Never Used  . Alcohol use Yes     Comment: occasional  . Drug use: No  . Sexual activity: Not on file   Other Topics Concern  . Not on file   Social History Narrative  . No narrative on file     Current Outpatient Prescriptions:  .  azelastine (OPTIVAR) 0.05 % ophthalmic solution, PLACE 1 DROP INTO AFFECTED EYE TWICE A DAY, Disp: , Rfl: 3 .  calcium carbonate (OS-CAL - DOSED IN MG OF ELEMENTAL CALCIUM) 1250 (500 Ca) MG tablet, Take by mouth., Disp: , Rfl:  .  carbamazepine (TEGRETOL XR) 200 MG 12 hr tablet, , Disp: , Rfl: 2 .  CARBATROL 100 MG 12 hr capsule, Take 2 tablets by mouth at bedtime., Disp: , Rfl: 3 .  Cyanocobalamin (VITAMIN B-12) 1000 MCG/15ML LIQD, Take 500 mcg by mouth once a week., Disp: , Rfl:  .  EPIPEN 2-PAK 0.3 MG/0.3ML SOAJ injection, USE AS DIRECTED AS NEEDED, Disp: ,  Rfl: 1 .  fluticasone (FLONASE) 50 MCG/ACT nasal spray, Place 1-2 sprays into both nostrils 3 (three) times a week. , Disp: , Rfl:  .  Iron TABS, Take 1 tablet by mouth daily., Disp: , Rfl:  .  levocetirizine (XYZAL) 5 MG tablet, Take 1 tablet (5 mg total) by mouth every evening., Disp: 90 tablet, Rfl: 0 .  meloxicam (MOBIC) 15 MG tablet, TAKE 1 TABLET BY MOUTH EVERY DAY, Disp: 30 tablet, Rfl: 2 .  mometasone (NASONEX) 50 MCG/ACT nasal spray, PLACE 2 SPRAYS INTO THE NOSE DAILY., Disp: 17 g, Rfl: 0 .  montelukast (SINGULAIR) 10 MG tablet, Take 1 tablet (10 mg total) by mouth daily., Disp: 90 tablet, Rfl: 1 .  Multiple Vitamins-Minerals (MULTI COMPLETE PO), Take 1 tablet by mouth daily., Disp: , Rfl:  .  rosuvastatin (CRESTOR) 5 MG tablet, Take 1 tablet (5 mg total) by mouth at bedtime., Disp: 90 tablet, Rfl: 1  Allergies  Allergen Reactions  . Latex Other (See Comments) and  Itching  . Sulfa Antibiotics Rash    [Rash] rash - paper tape OK  . Tape Rash    [Rash] rash - paper tape OK     Review of Systems  Constitutional: Positive for malaise/fatigue. Negative for chills, fever and weight loss.  Respiratory: Negative for shortness of breath.   Cardiovascular: Negative for chest pain.  Musculoskeletal: Positive for arthritis and joint pain. Negative for myalgias.    Objective  Vitals:   05/27/16 0836  BP: 120/71  Pulse: 84  Resp: 17  Temp: 98.6 F (37 C)  TempSrc: Oral  SpO2: 96%  Weight: 231 lb 9.6 oz (105.1 kg)  Height: 5\' 2"  (1.575 m)    Physical Exam  Constitutional: She is oriented to person, place, and time and well-developed, well-nourished, and in no distress.  HENT:  Head: Normocephalic and atraumatic.  Cardiovascular: Normal rate, regular rhythm and normal heart sounds.   No murmur heard. Pulmonary/Chest: Effort normal and breath sounds normal. She has no wheezes.  Abdominal: Soft. Bowel sounds are normal. There is no tenderness.  Musculoskeletal:       Right knee: She exhibits no swelling. Tenderness found.       Left knee: She exhibits no swelling. Tenderness found.       Legs: Mild tenderness to palpation over the superior portion of right and left knee  Neurological: She is alert and oriented to person, place, and time.  Psychiatric: Mood, memory, affect and judgment normal.  Nursing note and vitals reviewed.   Assessment & Plan  1. Hypercholesterolemia with hypertriglyceridemia Fasting lipid panel to be obtained, continue on Crestor. If triglycerides are persistently above 200, we'll consider treatment. - Lipid Profile - rosuvastatin (CRESTOR) 5 MG tablet; Take 1 tablet (5 mg total) by mouth at bedtime.  Dispense: 90 tablet; Refill: 1  2. Primary osteoarthritis of right knee Referral to orthopedics for consideration of knee injections - Ambulatory referral to Orthopedic Surgery  3. History of leukemia In remission,  we will obtain white count - CBC with Differential  4. Prediabetes A1c 6.0%, stable, no indication for pharmacotherapy - POCT HgB A1C  5. Need for immunization against influenza  - Flu Vaccine QUAD 36+ mos PF IM (Fluarix & Fluzone Quad PF)   Travontae Freiberger [facility] 05/27/2016 8:44 AM

## 2016-06-03 ENCOUNTER — Telehealth: Payer: Self-pay

## 2016-06-03 MED ORDER — ROSUVASTATIN CALCIUM 10 MG PO TABS: 10.0000 mg | ORAL_TABLET | Freq: Every day | ORAL | 0 refills | Status: DC

## 2016-06-03 NOTE — Telephone Encounter (Signed)
Patient has been notified of lab results and a prescription for rosuvastatin 10 mg at bedtime has been sent to [facility] [address] per Dr. Manuella Ghazi, patient has been notified and verbalized understanding

## 2016-06-13 ENCOUNTER — Encounter: Payer: BLUE CROSS/BLUE SHIELD | Attending: Surgery | Admitting: Dietician

## 2016-06-13 ENCOUNTER — Encounter: Payer: Self-pay | Admitting: Dietician

## 2016-06-13 DIAGNOSIS — Z9884 Bariatric surgery status: Secondary | ICD-10-CM | POA: Diagnosis present

## 2016-06-13 DIAGNOSIS — Z713 Dietary counseling and surveillance: Secondary | ICD-10-CM | POA: Insufficient documentation

## 2016-06-13 NOTE — Patient Instructions (Addendum)
3 oz meat at a time and then have as many non starchy vegetables as you want Have 1 serving fruit or sweet potato per day (1/2 cup) Have fruit at one snack and protein at second snack (light cheese or yogurt)  Goals: 1. Continue exercise routine! 2. Keep working on portion control  3. Keep practicing making healthy choices (3-4 oz protein 1st, then vegetables). Keep limiting starches and sweets.    Consider requesting a referral to physical therapy at [facility]. Look into the [facility] for water aerobics.   900-1,100 calories per day 70-90 grams protein per day 15 grams (or 1/2 cup) of carbohydrates at a time (about 60 grams total per day)   Surgery date: Gastric sleeve Surgery type: 04/26/2014 Start weight at [facility]: 255.5 lbs on 03/01/14 Weight today: 233.2 lbs Weight change: 4 lbs loss Total weight lost: 22.3 lbs   TANITA  BODY COMP RESULTS  04/07/14 05/13/14 06/14/14 08/10/14 11/23/14 03/13/15 05/15/15 08/15/15 12/12/15 03/13/16 06/13/16   BMI (kg/m^2) 47.9 43.9 42.2 40.3 39.7 39.7 41.1 42.3 43.8 43.3 42.7   Fat Mass (lbs) 132.5 100 113 104.5 100 102 107.5 116 120.6 118.6 116.8   Fat Free Mass (lbs) 129.5 140 117.5 116 117 115 117 115.5 118.8 118 116.4   Total Body Water (lbs) 95 102.5 86 85 85.5 84 85.5 84.5 86.6 86 84.6

## 2016-06-13 NOTE — Progress Notes (Signed)
  Follow-up visit:  2 years Post-Operative Gastric sleeve Surgery  Medical Nutrition Therapy:  Appt start time: [id number]  End time: 1100  Primary concerns today: Post-operative Bariatric Surgery Nutrition Management.  Appointment was translated by Spanish interpreter, Mardene Celeste [phone]) from [employer] interpreting services.  Stacey Nichols returns having lost 4 pounds since last visit. States that she controlled her portions over Thanksgiving and limited desserts and starches. Still walking and going to Weight Watchers. Has been walking 5 days a week for 45-60 minutes. Notices better sleep and breathing easier. "Walking relaxes me." Having some problems with her knee and was advised not to walk more than 10 minutes at a time. States that her doctor prefers that she swim or cycle. Interested in swimming and water aerobics. Currently receiving physical therapy for her knee.    Surgery date: Gastric sleeve Surgery type: 04/26/2014 Start weight at [facility]: 255.5 lbs on 03/01/14 Weight today: 233.2 lbs Weight change: 4 lbs loss Total weight lost: 22.3 lbs   TANITA  BODY COMP RESULTS  04/07/14 05/13/14 06/14/14 08/10/14 11/23/14 03/13/15 05/15/15 08/15/15 12/12/15 03/13/16 06/13/16   BMI (kg/m^2) 47.9 43.9 42.2 40.3 39.7 39.7 41.1 42.3 43.8 43.3 42.7   Fat Mass (lbs) 132.5 100 113 104.5 100 102 107.5 116 120.6 118.6 116.8   Fat Free Mass (lbs) 129.5 140 117.5 116 117 115 117 115.5 118.8 118 116.4   Total Body Water (lbs) 95 102.5 86 85 85.5 84 85.5 84.5 86.6 86 84.6    Preferred Learning Style:   No preference indicated   Learning Readiness:   Ready  24-hr recall: B (AM): 2 eggs with ham, coffee with milk and artificial sweetener Snk (AM):  L (12 PM): 4-6 oz of beef, fish, or chicken with vegetables, fruit Snk (PM):  D (PM): 3-4 oz of protein with peanut butter or avocado Snk (PM):   Also drinking a shake with fruit and protein in addition to 3 meals to meet "30 points for Weight Watchers."  Fluid intake:  water and decaf coffee with cream and Splenda, (48 oz water) Estimated total protein intake: 100+ grams  Medications: see list Supplementation: taking, sometimes forgets calcium  Drinking while eating: sometimes sips Hair loss: slowing down, taking Biotin Carbonated beverages: sips N/V/D/C: constipation Dumping syndrome: none  Recent physical activity:  Cardio 2x a week; bike 4-5x a week (300 minutes a week of exercise per patient)  Progress Towards Goal(s):  In progress.  Handouts provided: Support group fliers  Nutritional Diagnosis:  [place]-3.3 Overweight/obesity related to past poor dietary habits and physical inactivity as evidenced by patient w/ recent gastric sleeve surgery following dietary guidelines for continued weight loss.     Intervention:  Nutrition counseling provided. Goals: 3 oz meat at a time and then have as many non starchy vegetables as you want Have 1 serving fruit or sweet potato per day (1/2 cup) Have fruit at one snack and protein at second snack (light cheese or yogurt)  Advised patient that an additional shake may not be necessary to have in addition to 3 meals.   Teaching Method Utilized:  Visual Auditory Hands on  Barriers to learning/adherence to lifestyle change: none  Demonstrated degree of understanding via:  Teach Back   Monitoring/Evaluation:  Dietary intake, exercise, and body weight. Follow up in 3 months.

## 2016-06-20 ENCOUNTER — Other Ambulatory Visit: Payer: Self-pay | Admitting: Family Medicine

## 2016-06-20 DIAGNOSIS — J3089 Other allergic rhinitis: Secondary | ICD-10-CM

## 2016-07-02 ENCOUNTER — Other Ambulatory Visit: Payer: Self-pay | Admitting: Family Medicine

## 2016-07-25 ENCOUNTER — Other Ambulatory Visit: Payer: Self-pay | Admitting: Family Medicine

## 2016-07-25 DIAGNOSIS — J3089 Other allergic rhinitis: Secondary | ICD-10-CM

## 2016-08-27 ENCOUNTER — Encounter: Payer: Self-pay | Admitting: Family Medicine

## 2016-08-27 ENCOUNTER — Ambulatory Visit (INDEPENDENT_AMBULATORY_CARE_PROVIDER_SITE_OTHER): Payer: BLUE CROSS/BLUE SHIELD | Admitting: Family Medicine

## 2016-08-27 VITALS — BP 123/74 | HR 90 | Temp 98.9°F | Resp 17 | Ht 62.0 in | Wt 236.7 lb

## 2016-08-27 DIAGNOSIS — C9201 Acute myeloblastic leukemia, in remission: Secondary | ICD-10-CM | POA: Diagnosis not present

## 2016-08-27 DIAGNOSIS — M170 Bilateral primary osteoarthritis of knee: Secondary | ICD-10-CM | POA: Diagnosis not present

## 2016-08-27 DIAGNOSIS — E785 Hyperlipidemia, unspecified: Secondary | ICD-10-CM | POA: Diagnosis not present

## 2016-08-27 LAB — LIPID PANEL
CHOL/HDL RATIO: 2.1 ratio (ref <5.0)
Cholesterol: 155 mg/dL (ref <200)
HDL: 75 mg/dL (ref >50)
LDL Cholesterol: 59 mg/dL (ref <100)
TRIGLYCERIDES: 104 mg/dL (ref <150)
VLDL: 21 mg/dL (ref <30)

## 2016-08-27 LAB — CBC WITH DIFFERENTIAL/PLATELET
BASOS ABS: 0 {cells}/uL (ref 0–200)
BASOS PCT: 0 %
EOS PCT: 2 %
Eosinophils Absolute: 166 cells/uL (ref 15–500)
HCT: 44 % (ref 35.0–45.0)
HEMOGLOBIN: 14.6 g/dL (ref 11.7–15.5)
LYMPHS ABS: 3154 {cells}/uL (ref 850–3900)
Lymphocytes Relative: 38 %
MCH: 32.6 pg (ref 27.0–33.0)
MCHC: 33.2 g/dL (ref 32.0–36.0)
MCV: 98.2 fL (ref 80.0–100.0)
MPV: 10.3 fL (ref 7.5–12.5)
Monocytes Absolute: 664 cells/uL (ref 200–950)
Monocytes Relative: 8 %
NEUTROS ABS: 4316 {cells}/uL (ref 1500–7800)
Neutrophils Relative %: 52 %
Platelets: 196 10*3/uL (ref 140–400)
RBC: 4.48 MIL/uL (ref 3.80–5.10)
RDW: 14.4 % (ref 11.0–15.0)
WBC: 8.3 10*3/uL (ref 3.8–10.8)

## 2016-08-27 MED ORDER — MELOXICAM 15 MG PO TABS: 15.0000 mg | ORAL_TABLET | Freq: Every day | ORAL | 0 refills | Status: DC

## 2016-08-27 MED ORDER — ROSUVASTATIN CALCIUM 10 MG PO TABS: 10.0000 mg | ORAL_TABLET | Freq: Every day | ORAL | 0 refills | Status: DC

## 2016-08-27 NOTE — Progress Notes (Signed)
Name: Stacey Nichols   MRN: [id number]    DOB: [birth year]   Date:08/27/2016       Progress Note  Subjective  Chief Complaint  Chief Complaint  Patient presents with  . Follow-up    3 mo  . Labs Only    Fasting  . Medication Refill    Hyperlipidemia  This is a chronic problem. The problem is uncontrolled. Recent lipid tests were reviewed and are high (elevated Triglycerides). Pertinent negatives include no leg pain, myalgias or shortness of breath. Current antihyperlipidemic treatment includes statins.  Arthritis  Presents for follow-up visit. She complains of pain and stiffness. She reports no joint swelling. Affected locations include the left knee and right knee. Her pain is at a severity of 10/10 (when pain is worse, its 10/10, but usually it s a 6/10). Pertinent negatives include no dysuria, fatigue or weight loss (trying to lose weight).      Past Medical History:  Diagnosis Date  . Anemia    hx of  . Anxiety   . Arthritis    severe  . Cancer ([place])    leukemia  . Depression   . GERD (gastroesophageal reflux disease)   . H/O Bell's palsy   . H/O hiatal hernia   . Headache    occasional  . High triglycerides   . History of cancer chemotherapy   . Pneumonia    hx of   . Sleep apnea   . Stroke [facility])    "mini stroke-caused bells palsy for 1 month"    Past Surgical History:  Procedure Laterality Date  . ABDOMINAL HYSTERECTOMY  2001  . LAPAROSCOPIC GASTRIC BANDING  2000   performed in [place]  . TUBAL LIGATION  1996  . UPPER GI ENDOSCOPY N/A 04/26/2014   Procedure: UPPER GI ENDOSCOPY;  Surgeon: Pedro Earls, MD;  Location: [facility];  Service: General;  Laterality: N/A;    Family History  Problem Relation Age of Onset  . Heart disease Father     Social History   Social History  . Marital status: Married    Spouse name: N/A  . Number of children: N/A  . Years of education: N/A   Occupational History  . Not on file.   Social History Main Topics  .  Smoking status: Former Smoker    Years: 30.00    Types: Cigarettes    Quit date: 07/15/2005  . Smokeless tobacco: Never Used  . Alcohol use Yes     Comment: occasional  . Drug use: No  . Sexual activity: Not on file   Other Topics Concern  . Not on file   Social History Narrative  . No narrative on file     Current Outpatient Prescriptions:  .  azelastine (OPTIVAR) 0.05 % ophthalmic solution, PLACE 1 DROP INTO AFFECTED EYE TWICE A DAY, Disp: , Rfl: 3 .  calcium carbonate (OS-CAL - DOSED IN MG OF ELEMENTAL CALCIUM) 1250 (500 Ca) MG tablet, Take by mouth., Disp: , Rfl:  .  carbamazepine (TEGRETOL XR) 200 MG 12 hr tablet, , Disp: , Rfl: 2 .  CARBATROL 100 MG 12 hr capsule, Take 2 tablets by mouth at bedtime., Disp: , Rfl: 3 .  Cyanocobalamin (VITAMIN B-12) 1000 MCG/15ML LIQD, Take 500 mcg by mouth once a week., Disp: , Rfl:  .  EPIPEN 2-PAK 0.3 MG/0.3ML SOAJ injection, USE AS DIRECTED AS NEEDED, Disp: , Rfl: 1 .  fluticasone (FLONASE) 50 MCG/ACT nasal spray, Place 1-2 sprays into  both nostrils 3 (three) times a week. , Disp: , Rfl:  .  Iron TABS, Take 1 tablet by mouth daily., Disp: , Rfl:  .  levocetirizine (XYZAL) 5 MG tablet, Take 1 tablet (5 mg total) by mouth every evening., Disp: 90 tablet, Rfl: 0 .  meloxicam (MOBIC) 15 MG tablet, TAKE 1 TABLET BY MOUTH EVERY DAY, Disp: 30 tablet, Rfl: 2 .  mometasone (NASONEX) 50 MCG/ACT nasal spray, PLACE 2 SPRAYS INTO THE NOSE DAILY., Disp: 17 g, Rfl: 2 .  montelukast (SINGULAIR) 10 MG tablet, Take 1 tablet (10 mg total) by mouth daily., Disp: 90 tablet, Rfl: 1 .  Multiple Vitamins-Minerals (MULTI COMPLETE PO), Take 1 tablet by mouth daily., Disp: , Rfl:  .  rosuvastatin (CRESTOR) 10 MG tablet, Take 1 tablet (10 mg total) by mouth at bedtime., Disp: 90 tablet, Rfl: 0  Allergies  Allergen Reactions  . Latex Other (See Comments) and Itching  . Sulfa Antibiotics Rash    [Rash] rash - paper tape OK  . Tape Rash    [Rash] rash - paper tape OK      Review of Systems  Constitutional: Negative for fatigue and weight loss (trying to lose weight).  Respiratory: Negative for shortness of breath.   Genitourinary: Negative for dysuria.  Musculoskeletal: Positive for arthritis and stiffness. Negative for joint swelling and myalgias.    Objective  Vitals:   08/27/16 0837  BP: 123/74  Pulse: 90  Resp: 17  Temp: 98.9 F (37.2 C)  TempSrc: Oral  SpO2: 96%  Weight: 236 lb 11.2 oz (107.4 kg)  Height: 5\' 2"  (1.575 m)    Physical Exam  Constitutional: She is oriented to person, place, and time and well-developed, well-nourished, and in no distress.  HENT:  Head: Normocephalic and atraumatic.  Cardiovascular: Normal rate, regular rhythm and normal heart sounds.   No murmur heard. Pulmonary/Chest: Effort normal and breath sounds normal. She has no wheezes.  Abdominal: Soft. Bowel sounds are normal. There is no tenderness.  Musculoskeletal:       Right knee: Tenderness found. Patellar tendon tenderness noted.       Left knee: Tenderness found. Patellar tendon tenderness noted.       Right ankle: She exhibits no swelling.       Left ankle: She exhibits no swelling.  Neurological: She is alert and oriented to person, place, and time.  Psychiatric: Mood, memory, affect and judgment normal.  Nursing note and vitals reviewed.   Assessment & Plan  1. 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 Wishes to have her white count checked, has AML in remission. - CBC with Differential/Platelet  2. Dyslipidemia Obtain FLP for elevated TGs,  - rosuvastatin (CRESTOR) 10 MG tablet; Take 1 tablet (10 mg total) by mouth at bedtime.  Dispense: 90 tablet; Refill: 0 - Lipid panel  3. Primary osteoarthritis of both knees Continue on Meloxicam for treatment. - meloxicam (MOBIC) 15 MG tablet; Take 1 tablet (15 mg total) by mouth daily.  Dispense: 90 tablet; Refill: 0   Barnabas Henriques [facility] 08/27/2016 8:48 AM

## 2016-10-28 LAB — BASIC METABOLIC PANEL
Creatinine: 0.7 (ref 0.5–1.1)
Glucose: 111
Potassium: 4.2 (ref 3.4–5.3)
Sodium: 140 (ref 137–147)

## 2016-10-28 LAB — HEPATIC FUNCTION PANEL
ALK PHOS: 63 (ref 25–125)
ALT: 25 (ref 7–35)
AST: 25 (ref 13–35)
BILIRUBIN, TOTAL: 0.6

## 2016-10-28 LAB — HM MAMMOGRAPHY

## 2016-10-28 LAB — CBC AND DIFFERENTIAL
HEMOGLOBIN: 14.4 (ref 12.0–16.0)
Platelets: 166 (ref 150–399)
WBC: 6.4

## 2016-11-12 ENCOUNTER — Ambulatory Visit: Payer: BLUE CROSS/BLUE SHIELD | Admitting: Skilled Nursing Facility1

## 2016-11-25 ENCOUNTER — Encounter: Payer: BLUE CROSS/BLUE SHIELD | Admitting: Family Medicine

## 2016-12-12 ENCOUNTER — Encounter: Payer: BLUE CROSS/BLUE SHIELD | Attending: Surgery | Admitting: Skilled Nursing Facility1

## 2016-12-12 ENCOUNTER — Encounter: Payer: Self-pay | Admitting: Skilled Nursing Facility1

## 2016-12-12 DIAGNOSIS — Z713 Dietary counseling and surveillance: Secondary | ICD-10-CM | POA: Insufficient documentation

## 2016-12-12 DIAGNOSIS — Z6841 Body Mass Index (BMI) 40.0 and over, adult: Secondary | ICD-10-CM | POA: Diagnosis present

## 2016-12-12 NOTE — Patient Instructions (Addendum)
-  Aim for 64 fluid ounces (not just water)  -Try to chew your food until applesauce consistency  -Sugar free jello and sugar free popcicle counts towards fluid

## 2016-12-12 NOTE — Progress Notes (Signed)
  Follow-up visit:  2 years Post-Operative Gastric sleeve Surgery  Medical Nutrition Therapy:  Appt start time: 1443  End time: 1100  Primary concerns today: Post-operative Bariatric Surgery Nutrition Management.  Pt wants her weight in kg. Pt states her Knees are hurting but getting back on track with walking. Pt states she has found a pool to do water aerobics. In remission from leukemia. Pt states she is doing weight watchers. Pt states she has reflux on occasion from overeating. Pt states she has 2 cups of flax seeds every day to have a bowel movement.  Pt is not meeting her fluid needs.   Surgery date: Gastric sleeve Surgery type: 04/26/2014 Start weight at [facility]: 255.5 lbs on 03/01/14 Weight today: 247.2 lbs   TANITA  BODY COMP RESULTS  04/07/14 05/13/14 06/14/14 08/10/14 11/23/14 03/13/15 05/15/15 08/15/15 12/12/15 03/13/16 06/13/16 12/12/2013   BMI (kg/m^2) 47.9 43.9 42.2 40.3 39.7 39.7 41.1 42.3 43.8 43.3 42.7 45.2   Fat Mass (lbs) 132.5 100 113 104.5 100 102 107.5 116 120.6 118.6 116.8 125.6   Fat Free Mass (lbs) 129.5 140 117.5 116 117 115 117 115.5 118.8 118 116.4 121.6   Total Body Water (lbs) 95 102.5 86 85 85.5 84 85.5 84.5 86.6 86 84.6 88.8    Preferred Learning Style:   No preference indicated   Learning Readiness:   Ready  24-hr recall: B (AM): 2 eggs with ham and vegetables, coffee with milk and artificial sweetener OR protein shake Snk (AM):  L (12 PM): 4-6 oz of beef, fish, or chicken with vegetables, fruit OR drink she cannot remember what it is but it has 120 calories  Snk (PM): protein shake D (PM): restaurant meal Snk (10PM): restaurant meal other hafl  Also drinking a shake with fruit and protein in addition to 3 meals to meet "30 points for Weight Watchers."  Fluid intake: water and decaf coffee with cream and Splenda, (48 oz water) Estimated total protein intake: 100+ grams  Medications: see list Supplementation: taking, sometimes forgets  calcium  Drinking while eating: sometimes sips Hair loss: slowing down, taking Biotin Carbonated beverages: sips N/V/D/C: constipation Dumping syndrome: none Having you been chewing well: no Chewing/swallowing difficulties: no Changes in vision: no Changes to mood/headaches: no Hair loss/C[place]es to skin/Changes to nails: no Any difficulty focusing or concentrating: no Sweating: no Dizziness/Lightheaded: no Palpitations: no  Carbonated beverages: no Abdominal Pain: no  Recent physical activity:  Cardio 2x a week; bike 4-5x a week (300 minutes a week of exercise per patient)  Progress Towards Goal(s):  In progress.  Handouts provided: Support group fliers  Nutritional Diagnosis:  [place]-3.3 Overweight/obesity related to past poor dietary habits and physical inactivity as evidenced by patient w/ recent gastric sleeve surgery following dietary guidelines for continued weight loss.     Intervention:  Nutrition counseling provided. Goals: -Aim for 64 fluid ounces (not just water) -Try to chew your food until applesauce consistency -Sugar free jello and sugar free popcicle counts towards fluid  Advised patient that an additional shake may not be necessary to have in addition to 3 meals.   Teaching Method Utilized:  Visual Auditory Hands on  Barriers to learning/adherence to lifestyle change: none  Demonstrated degree of understanding via:  Teach Back   Monitoring/Evaluation:  Dietary intake, exercise, and body weight. Follow up in 3 months.

## 2016-12-20 ENCOUNTER — Other Ambulatory Visit: Payer: Self-pay | Admitting: Family Medicine

## 2016-12-20 DIAGNOSIS — M170 Bilateral primary osteoarthritis of knee: Secondary | ICD-10-CM

## 2016-12-25 ENCOUNTER — Encounter: Payer: Self-pay | Admitting: Family Medicine

## 2016-12-25 ENCOUNTER — Ambulatory Visit (INDEPENDENT_AMBULATORY_CARE_PROVIDER_SITE_OTHER): Payer: BLUE CROSS/BLUE SHIELD | Admitting: Family Medicine

## 2016-12-25 VITALS — BP 122/70 | HR 94 | Temp 98.3°F | Resp 16 | Ht 62.0 in | Wt 251.1 lb

## 2016-12-25 DIAGNOSIS — M170 Bilateral primary osteoarthritis of knee: Secondary | ICD-10-CM | POA: Diagnosis not present

## 2016-12-25 DIAGNOSIS — Z0000 Encounter for general adult medical examination without abnormal findings: Secondary | ICD-10-CM

## 2016-12-25 DIAGNOSIS — Z1159 Encounter for screening for other viral diseases: Secondary | ICD-10-CM

## 2016-12-25 LAB — COMPLETE METABOLIC PANEL WITH GFR
ALT: 24 U/L (ref 6–29)
AST: 21 U/L (ref 10–35)
Albumin: 4.2 g/dL (ref 3.6–5.1)
Alkaline Phosphatase: 68 U/L (ref 33–130)
BUN: 20 mg/dL (ref 7–25)
CHLORIDE: 105 mmol/L (ref 98–110)
CO2: 26 mmol/L (ref 20–31)
CREATININE: 0.76 mg/dL (ref 0.50–1.05)
Calcium: 9 mg/dL (ref 8.6–10.4)
GFR, Est Non African American: 89 mL/min (ref >=60)
Glucose, Bld: 106 mg/dL — ABNORMAL HIGH (ref 65–99)
Potassium: 4.2 mmol/L (ref 3.5–5.3)
Sodium: 140 mmol/L (ref 135–146)
Total Bilirubin: 0.4 mg/dL (ref 0.2–1.2)
Total Protein: 6.7 g/dL (ref 6.1–8.1)

## 2016-12-25 NOTE — Progress Notes (Signed)
Name: Stacey Nichols   MRN: [id number]    DOB: [birth year]   Date:12/25/2016       Progress Note  Subjective  Chief Complaint  Chief Complaint  Patient presents with  . Annual Exam    HPI  Pt. Presents for Complete Physical Exam.  She gets her female screening from GYN.  Her last colonoscopy was in 2015, repeat in 2020.    Past Medical History:  Diagnosis Date  . Anemia    hx of  . Anxiety   . Arthritis    severe  . Cancer ([place])    leukemia  . Depression   . GERD (gastroesophageal reflux disease)   . H/O Bell's palsy   . H/O hiatal hernia   . Headache    occasional  . High triglycerides   . History of cancer chemotherapy   . Pneumonia    hx of   . Sleep apnea   . Stroke [facility])    "mini stroke-caused bells palsy for 1 month"    Past Surgical History:  Procedure Laterality Date  . ABDOMINAL HYSTERECTOMY  2001  . LAPAROSCOPIC GASTRIC BANDING  2000   performed in [place]  . TUBAL LIGATION  1996  . UPPER GI ENDOSCOPY N/A 04/26/2014   Procedure: UPPER GI ENDOSCOPY;  Surgeon: Pedro Earls, MD;  Location: [facility];  Service: General;  Laterality: N/A;    Family History  Problem Relation Age of Onset  . Heart disease Father     Social History   Social History  . Marital status: Married    Spouse name: N/A  . Number of children: N/A  . Years of education: N/A   Occupational History  . Not on file.   Social History Main Topics  . Smoking status: Former Smoker    Years: 30.00    Types: Cigarettes    Quit date: 07/15/2005  . Smokeless tobacco: Never Used  . Alcohol use Yes     Comment: occasional  . Drug use: No  . Sexual activity: Not on file   Other Topics Concern  . Not on file   Social History Narrative  . No narrative on file     Current Outpatient Prescriptions:  .  azelastine (OPTIVAR) 0.05 % ophthalmic solution, PLACE 1 DROP INTO AFFECTED EYE TWICE A DAY, Disp: , Rfl: 3 .  calcium carbonate (OS-CAL - DOSED IN MG OF ELEMENTAL CALCIUM)  1250 (500 Ca) MG tablet, Take by mouth., Disp: , Rfl:  .  carbamazepine (TEGRETOL XR) 200 MG 12 hr tablet, , Disp: , Rfl: 2 .  CARBATROL 100 MG 12 hr capsule, Take 2 tablets by mouth at bedtime., Disp: , Rfl: 3 .  Cyanocobalamin (VITAMIN B-12) 1000 MCG/15ML LIQD, Take 500 mcg by mouth once a week., Disp: , Rfl:  .  EPIPEN 2-PAK 0.3 MG/0.3ML SOAJ injection, USE AS DIRECTED AS NEEDED, Disp: , Rfl: 1 .  fluticasone (FLONASE) 50 MCG/ACT nasal spray, Place 1-2 sprays into both nostrils 3 (three) times a week. , Disp: , Rfl:  .  Iron TABS, Take 1 tablet by mouth daily., Disp: , Rfl:  .  levocetirizine (XYZAL) 5 MG tablet, Take 1 tablet (5 mg total) by mouth every evening., Disp: 90 tablet, Rfl: 0 .  meloxicam (MOBIC) 15 MG tablet, TAKE 1 TABLET (15 MG TOTAL) BY MOUTH DAILY., Disp: 90 tablet, Rfl: 0 .  mometasone (NASONEX) 50 MCG/ACT nasal spray, PLACE 2 SPRAYS INTO THE NOSE DAILY., Disp: 17 g, Rfl: 2 .  montelukast (SINGULAIR) 10 MG tablet, Take 1 tablet (10 mg total) by mouth daily., Disp: 90 tablet, Rfl: 1 .  Multiple Vitamins-Minerals (MULTI COMPLETE PO), Take 1 tablet by mouth daily., Disp: , Rfl:  .  rosuvastatin (CRESTOR) 10 MG tablet, Take 1 tablet (10 mg total) by mouth at bedtime., Disp: 90 tablet, Rfl: 0  Allergies  Allergen Reactions  . Latex Other (See Comments) and Itching  . Sulfa Antibiotics Rash    [Rash] rash - paper tape OK  . Tape Rash    [Rash] rash - paper tape OK     Review of Systems  Constitutional: Negative for chills, fever, malaise/fatigue and weight loss.  HENT: Negative for congestion, ear pain, sinus pain and sore throat.   Eyes: Negative for blurred vision and double vision.  Respiratory: Negative for cough and shortness of breath.   Cardiovascular: Negative for chest pain. Leg swelling: intermittent leg swelling.  Gastrointestinal: Positive for constipation. Negative for abdominal pain, blood in stool, diarrhea, nausea and vomiting.  Genitourinary: Negative  for dysuria and hematuria.  Musculoskeletal: Positive for joint pain (worsening pain in hands and knees).  Neurological: Negative for dizziness and headaches.  Psychiatric/Behavioral: Negative for depression. The patient is nervous/anxious.     Objective  Vitals:   12/25/16 1121  BP: 122/70  Pulse: 94  Resp: 16  Temp: 98.3 F (36.8 C)  TempSrc: Oral  SpO2: 93%  Weight: 251 lb 1.6 oz (113.9 kg)  Height: 5\' 2"  (1.575 m)    Physical Exam  Constitutional: She is oriented to person, place, and time and well-developed, well-nourished, and in no distress.  HENT:  Head: Normocephalic and atraumatic.  Right Ear: External ear normal.  Left Ear: External ear normal.  Mouth/Throat: Oropharynx is clear and moist.  Eyes: Pupils are equal, round, and reactive to light.  Neck: Neck supple. No thyromegaly present.  Cardiovascular: Normal rate, regular rhythm and normal heart sounds.   No murmur heard. Pulmonary/Chest: Effort normal and breath sounds normal. She has no wheezes.  Abdominal: Soft. Bowel sounds are normal. There is no tenderness.  Musculoskeletal: She exhibits no edema.       Right knee: She exhibits swelling and erythema. Tenderness found.       Left knee: She exhibits swelling and erythema. Tenderness found.       Legs: Coarse crepitus elicited on gentle flexion and extension of both knees  Neurological: She is alert and oriented to person, place, and time.  Psychiatric: Mood, memory, affect and judgment normal.  Nursing note and vitals reviewed.     Assessment & Plan  1. Well woman exam without gynecological exam Obtain age-appropriate laboratory screenings - VITAMIN D 25 Hydroxy (Vit-D Deficiency, Fractures) - TSH - COMPLETE METABOLIC PANEL WITH GFR  2. Primary osteoarthritis of both knees Worsening pain because of osteoarthritis, patient reports meloxicam is not as effective as before. Explained that next step in therapy is likely opioids which involve the risk  of dependency and patient does not want to do that as yet. She is going to orthopedics and has received injections in the past. She will continue on meloxicam for now and reassess in 3 months  3. Need for hepatitis C screening test  - Hepatitis C antibody   Quantina Dershem Asad A. [facility] 12/25/2016 11:28 AM

## 2016-12-26 ENCOUNTER — Telehealth: Payer: Self-pay

## 2016-12-26 LAB — TSH: TSH: 2.09 m[IU]/L

## 2016-12-26 LAB — VITAMIN D 25 HYDROXY (VIT D DEFICIENCY, FRACTURES): VIT D 25 HYDROXY: 22 ng/mL — AB (ref 30–100)

## 2016-12-26 LAB — HEPATITIS C ANTIBODY: HCV Ab: NEGATIVE

## 2016-12-26 MED ORDER — VITAMIN D (ERGOCALCIFEROL) 1.25 MG (50000 UNIT) PO CAPS: 50000.0000 [IU] | ORAL_CAPSULE | ORAL | 0 refills | Status: DC

## 2016-12-26 NOTE — Telephone Encounter (Signed)
Patient has been notified of lab results and a prescription for vitamin D3 50,000 units take 1 capsule once a week x12 weeks has been sent to [facility] [address] per Dr. Manuella Ghazi, patient has been notified and verbalized understanding

## 2017-01-02 ENCOUNTER — Encounter (INDEPENDENT_AMBULATORY_CARE_PROVIDER_SITE_OTHER): Payer: BLUE CROSS/BLUE SHIELD | Admitting: Family Medicine

## 2017-01-09 ENCOUNTER — Ambulatory Visit (INDEPENDENT_AMBULATORY_CARE_PROVIDER_SITE_OTHER): Payer: BLUE CROSS/BLUE SHIELD | Admitting: Family Medicine

## 2017-01-21 ENCOUNTER — Ambulatory Visit (INDEPENDENT_AMBULATORY_CARE_PROVIDER_SITE_OTHER): Payer: BLUE CROSS/BLUE SHIELD | Admitting: Family Medicine

## 2017-01-21 ENCOUNTER — Encounter (INDEPENDENT_AMBULATORY_CARE_PROVIDER_SITE_OTHER): Payer: Self-pay | Admitting: Family Medicine

## 2017-01-21 VITALS — BP 104/73 | HR 80 | Temp 98.0°F | Ht 63.0 in | Wt 246.0 lb

## 2017-01-21 DIAGNOSIS — Z1389 Encounter for screening for other disorder: Secondary | ICD-10-CM | POA: Diagnosis not present

## 2017-01-21 DIAGNOSIS — E669 Obesity, unspecified: Secondary | ICD-10-CM | POA: Diagnosis not present

## 2017-01-21 DIAGNOSIS — R5383 Other fatigue: Secondary | ICD-10-CM

## 2017-01-21 DIAGNOSIS — Z0289 Encounter for other administrative examinations: Secondary | ICD-10-CM

## 2017-01-21 DIAGNOSIS — R0602 Shortness of breath: Secondary | ICD-10-CM | POA: Insufficient documentation

## 2017-01-21 DIAGNOSIS — Z1331 Encounter for screening for depression: Secondary | ICD-10-CM

## 2017-01-21 DIAGNOSIS — Z6841 Body Mass Index (BMI) 40.0 and over, adult: Secondary | ICD-10-CM | POA: Diagnosis not present

## 2017-01-21 DIAGNOSIS — E559 Vitamin D deficiency, unspecified: Secondary | ICD-10-CM

## 2017-01-21 DIAGNOSIS — R0600 Dyspnea, unspecified: Secondary | ICD-10-CM | POA: Insufficient documentation

## 2017-01-21 DIAGNOSIS — E784 Other hyperlipidemia: Secondary | ICD-10-CM | POA: Diagnosis not present

## 2017-01-21 DIAGNOSIS — IMO0001 Reserved for inherently not codable concepts without codable children: Secondary | ICD-10-CM

## 2017-01-21 DIAGNOSIS — E7849 Other hyperlipidemia: Secondary | ICD-10-CM

## 2017-01-21 NOTE — Progress Notes (Signed)
Office: [phone]  /  Fax: [phone]   Dear Dr. Hassell Done,   Thank you for referring Stacey Nichols to our clinic. The following note includes my evaluation and treatment recommendations.  HPI:   Chief Complaint: OBESITY  Stacey Nichols has been referred by Isabel Caprice. Hassell Done, MD, FACS for consultation regarding her obesity and obesity related comorbidities.  Stacey Nichols (MR# [id number]) is a 56 y.o. female who presents on 01/21/2017 for obesity evaluation and treatment. Current BMI is Body mass index is 43.58 kg/m.Marland Kitchen Stacey Nichols has struggled with obesity for years and has been unsuccessful in either losing weight or maintaining long term weight loss. Kit attended our information session and states she is currently in the action stage of change and ready to dedicate time achieving and maintaining a healthier weight.  Stacey Nichols states she thinks her family will eat healthier with  her she struggles with family and or coworkers weight loss sabotage her desired weight loss is 102 lbs she has been heavy most of  her life she started gaining weight after giving birth her heaviest weight ever was 260 lbs. she has significant food cravings issues  she snacks frequently in the evenings she wakes up frquently in the middle of the night to eat she skips meals frequently she is frequently drinking liquids with calories she frequently makes poor food choices she has problems with excessive hunger  she frequently eats larger portions than normal  she has binge eating behaviors she struggles with emotional eating    Stacey Nichols Stacey Nichols feels her energy is lower than it should be. This has worsened with weight gain and has not worsened recently. Stacey Nichols admits to daytime somnolence and  admits to waking up still tired. Stacey Nichols is at risk for obstructive sleep apnea. Patent has a history of symptoms of daytime Stacey Nichols and morning Stacey Nichols. Stacey Nichols generally gets 4 or 5 hours of sleep per night, and states they  generally have restless sleep. Snoring is present. Apneic episodes are present. Epworth Sleepiness Score is 14  Dyspnea on exertion Stacey Nichols notes increasing shortness of breath with exercising and seems to be worsening over time with weight gain. She notes getting out of breath sooner with activity than she used to. This has not gotten worse recently. Stacey Nichols denies orthopnea.  Vitamin D deficiency Shameika has a diagnosis of vitamin D deficiency. She is currently taking vit D weekly. She admits Stacey Nichols and denies nausea, vomiting or muscle weakness.  Hyperlipidemia Stacey Nichols has hyperlipidemia and is on statin. She is attempting to improve her cholesterol levels with intensive lifestyle modification including a low saturated fat diet, exercise and weight loss. She denies any chest pain, claudication or myalgias.   Depression Screen Stacey Nichols (modified PHQ-9) score was  Depression screen PHQ 2/9 01/21/2017  Decreased Interest 3  Down, Depressed, Hopeless 3  PHQ - 2 Score 6  Altered sleeping 3  Tired, decreased energy 3  Change in appetite 3  Feeling bad or failure about yourself  3  Trouble concentrating 2  Moving slowly or fidgety/restless 2  Suicidal thoughts 1  PHQ-9 Score 23    Stacey Nichols: Stacey Nichols  Allergen Reactions  . Latex Other (See Comments) and Itching  . Sulfa Antibiotics Rash    [Rash] rash - paper tape OK  . Tape Rash    [Rash] rash - paper tape OK    MEDICATIONS: Current Outpatient Prescriptions on File Prior to Visit  Medication Sig Dispense Refill  . azelastine (OPTIVAR) 0.05 %  ophthalmic solution PLACE 1 DROP INTO AFFECTED EYE TWICE A DAY  3  . calcium carbonate (OS-CAL - DOSED IN MG OF ELEMENTAL CALCIUM) 1250 (500 Ca) MG tablet Take by mouth.    . carbamazepine (TEGRETOL XR) 200 MG 12 hr tablet   2  . CARBATROL 100 MG 12 hr capsule Take 2 tablets by mouth at bedtime.  3  . Cyanocobalamin (VITAMIN B-12) 1000 MCG/15ML LIQD Take 500 mcg by mouth once a  week.    . EPIPEN 2-PAK 0.3 MG/0.3ML SOAJ injection USE AS DIRECTED AS NEEDED  1  . fluticasone (FLONASE) 50 MCG/ACT nasal spray Place 1-2 sprays into both nostrils 3 (three) times a week.     . Iron TABS Take 1 tablet by mouth daily.    Marland Kitchen levocetirizine (XYZAL) 5 MG tablet Take 1 tablet (5 mg total) by mouth every evening. 90 tablet 0  . meloxicam (MOBIC) 15 MG tablet TAKE 1 TABLET (15 MG TOTAL) BY MOUTH DAILY. 90 tablet 0  . mometasone (NASONEX) 50 MCG/ACT nasal spray PLACE 2 SPRAYS INTO THE NOSE DAILY. 17 g 2  . montelukast (SINGULAIR) 10 MG tablet Take 1 tablet (10 mg total) by mouth daily. 90 tablet 1  . Multiple Vitamins-Minerals (MULTI COMPLETE PO) Take 1 tablet by mouth daily.    . rosuvastatin (CRESTOR) 10 MG tablet Take 1 tablet (10 mg total) by mouth at bedtime. 90 tablet 0  . Vitamin D, Ergocalciferol, (DRISDOL) 50000 units CAPS capsule Take 1 capsule (50,000 Units total) by mouth once a week. For 12 weeks 12 capsule 0   No current facility-administered medications on file prior to visit.     PAST MEDICAL HISTORY: Past Medical History:  Diagnosis Date  . Anemia    hx of  . Anxiety   . Arthritis    severe  . Cancer ([place])    leukemia  . Constipation   . Depression   . GERD (gastroesophageal reflux disease)   . H/O Bell's palsy   . H/O hiatal hernia   . Headache    occasional  . High triglycerides   . History of cancer chemotherapy   . Leukemia ([place])   . Pneumonia    hx of   . Rheumatoid arthritis ([place])   . Sleep apnea   . Stroke ([place])    "mini stroke-caused bells palsy for 1 month"    PAST SURGICAL HISTORY: Past Surgical History:  Procedure Laterality Date  . ABDOMINAL HYSTERECTOMY  2001  . LAPAROSCOPIC GASTRIC BAND REMOVAL WITH LAPAROSCOPIC GASTRIC SLEEVE RESECTION    . LAPAROSCOPIC GASTRIC BANDING  2000   performed in [place]  . TUBAL LIGATION  1996  . UPPER GI ENDOSCOPY N/A 04/26/2014   Procedure: UPPER GI ENDOSCOPY;  Surgeon: Pedro Earls, MD;   Location: [facility];  Service: General;  Laterality: N/A;    SOCIAL HISTORY: Social History  Substance Use Topics  . Smoking status: Former Smoker    Years: 30.00    Types: Cigarettes    Quit date: 07/15/2005  . Smokeless tobacco: Never Used  . Alcohol use Yes     Comment: occasional    FAMILY HISTORY: Family History  Problem Relation Age of Onset  . Heart disease Father   . Hyperlipidemia Father   . Obesity Father     ROS: Review of Systems  Constitutional: Positive for malaise/Stacey Nichols.  Respiratory: Positive for shortness of breath (on exertion).   Cardiovascular: Negative for chest pain, orthopnea and claudication.  Gastrointestinal: Negative  for nausea and vomiting.  Musculoskeletal: Negative for myalgias.       Negative muscle weakness    PHYSICAL EXAM: Blood pressure 104/73, pulse 80, temperature 98 F (36.7 C), temperature source Oral, height 5\' 3"  (1.6 m), weight 246 lb (111.6 kg), SpO2 95 %. Body mass index is 43.58 kg/m. Physical Exam  Constitutional: She is oriented to person, place, and time. She appears well-developed and well-nourished.  Cardiovascular: Normal rate.   Pulmonary/Chest: Effort normal.  Musculoskeletal: Normal range of motion.  Neurological: She is oriented to person, place, and time.  Skin: Skin is warm and dry.  Psychiatric: She has a normal Nichols and affect. Her behavior is normal.  Vitals reviewed.   RECENT LABS AND TESTS: BMET    Component Value Date/Time   NA 140 12/25/2016 1201   NA 143 06/20/2015 1141   NA 142 01/14/2013 1757   K 4.2 12/25/2016 1201   K 4.6 01/14/2013 1757   CL 105 12/25/2016 1201   CL 107 01/14/2013 1757   CO2 26 12/25/2016 1201   CO2 29 01/14/2013 1757   GLUCOSE 106 (H) 12/25/2016 1201   GLUCOSE 95 01/14/2013 1757   BUN 20 12/25/2016 1201   BUN 18 06/20/2015 1141   BUN 11 01/14/2013 1757   CREATININE 0.76 12/25/2016 1201   CALCIUM 9.0 12/25/2016 1201   CALCIUM 9.2 01/14/2013 1757   GFRNONAA 89  12/25/2016 1201   GFRAA >89 12/25/2016 1201   Lab Results  Component Value Date   HGBA1C 6.0 05/27/2016   No results found for: INSULIN CBC    Component Value Date/Time   WBC 8.3 08/27/2016 0904   RBC 4.48 08/27/2016 0904   HGB 14.6 08/27/2016 0904   HGB 14.2 06/20/2015 1141   HCT 44.0 08/27/2016 0904   HCT 41.8 06/20/2015 1141   PLT 196 08/27/2016 0904   PLT 214 06/20/2015 1141   MCV 98.2 08/27/2016 0904   MCV 96 06/20/2015 1141   MCV 96 01/15/2013 0522   MCH 32.6 08/27/2016 0904   MCHC 33.2 08/27/2016 0904   RDW 14.4 08/27/2016 0904   RDW 14.1 06/20/2015 1141   RDW 14.3 01/15/2013 0522   LYMPHSABS 3,154 08/27/2016 0904   LYMPHSABS 2.6 06/20/2015 1141   LYMPHSABS 3.6 01/15/2013 0522   MONOABS 664 08/27/2016 0904   MONOABS 0.8 01/15/2013 0522   EOSABS 166 08/27/2016 0904   EOSABS 0.2 06/20/2015 1141   EOSABS 0.3 01/15/2013 0522   BASOSABS 0 08/27/2016 0904   BASOSABS 0.0 06/20/2015 1141   BASOSABS 0.0 01/15/2013 0522   Iron/TIBC/Ferritin/ %Sat No results found for: IRON, TIBC, FERRITIN, IRONPCTSAT Lipid Panel     Component Value Date/Time   CHOL 155 08/27/2016 0904   CHOL 228 (H) 06/20/2015 1141   CHOL 190 01/15/2013 0522   TRIG 104 08/27/2016 0904   TRIG 307 (H) 01/15/2013 0522   HDL 75 08/27/2016 0904   HDL 56 06/20/2015 1141   HDL 44 01/15/2013 0522   CHOLHDL 2.1 08/27/2016 0904   VLDL 21 08/27/2016 0904   VLDL 61 (H) 01/15/2013 0522   LDLCALC 59 08/27/2016 0904   LDLCALC 112 (H) 06/20/2015 1141   LDLCALC 85 01/15/2013 0522   Hepatic Function Panel     Component Value Date/Time   PROT 6.7 12/25/2016 1201   PROT 6.7 06/20/2015 1141   PROT 7.7 01/14/2013 1757   ALBUMIN 4.2 12/25/2016 1201   ALBUMIN 4.3 06/20/2015 1141   ALBUMIN 3.8 01/14/2013 1757   AST 21 12/25/2016  1201   AST 25 01/14/2013 1757   ALT 24 12/25/2016 1201   ALT 39 01/14/2013 1757   ALKPHOS 68 12/25/2016 1201   ALKPHOS 100 01/14/2013 1757   BILITOT 0.4 12/25/2016 1201    BILITOT 0.3 06/20/2015 1141   BILITOT 0.3 01/14/2013 1757      Component Value Date/Time   TSH 2.09 12/25/2016 1201   TSH 3.163 03/01/2014 1201    ECG  shows NSR with a rate of 81 BPM INDIRECT CALORIMETER done today shows a VO2 of 260 and a REE of 1811.    ASSESSMENT AND PLAN: Other Stacey Nichols - Plan: EKG 12-Lead, Vitamin B12, CBC With Differential, Comprehensive metabolic panel, Folate, Hemoglobin A1c, Insulin, random, T3, T4, free, TSH  Shortness of breath on exertion  Vitamin D deficiency - Plan: VITAMIN D 25 Hydroxy (Vit-D Deficiency, Fractures)  Other hyperlipidemia - Plan: Lipid Panel With LDL/HDL Ratio  Depression screening  Class 3 obesity with serious comorbidity and body mass index (BMI) of 40.0 to 44.9 in adult, unspecified obesity type ([place])  PLAN: Stacey Nichols Araminta was informed that her Stacey Nichols may be related to obesity, depression or many other causes. Labs will be ordered, and in the meanwhile Stacey Nichols has agreed to work on diet, exercise and weight loss to help with Stacey Nichols. Proper sleep hygiene was discussed including the need for 7-8 hours of quality sleep each night. A sleep study was not ordered based on symptoms and Epworth score.  Dyspnea on exertion Yesly's shortness of breath appears to be obesity related and exercise induced. She has agreed to work on weight loss and gradually increase exercise to treat her exercise induced shortness of breath. If Sukanya follows our instructions and loses weight without improvement of her shortness of breath, we will plan to refer to pulmonology. We will monitor this condition regularly. Haset agrees to this plan.  Vitamin D Deficiency Stacey Nichols was informed that low vitamin D levels contributes to Stacey Nichols and are associated with obesity, breast, and colon cancer. She agrees to continue to take prescription Vit D @50 ,000 IU every week and will follow up for routine testing of vitamin D, at least 2-3 times per year. She was informed of  the risk of over-replacement of vitamin D and agrees to not increase her dose unless he discusses this with [place] first.  Hyperlipidemia Stacey Nichols was informed of the American Heart Association Guidelines emphasizing intensive lifestyle modifications as the first line treatment for hyperlipidemia. We discussed many lifestyle modifications today in depth, and Stacey Nichols will continue to work on decreasing saturated fats such as fatty red meat, butter and many fried foods. She will also increase vegetables and lean protein in her diet and continue to work on exercise and weight loss efforts. We will check labs and Stacey Nichols agrees to follow up with our clinic in 2 weeks.  Depression Screen Stacey Nichols had a strongly positive depression screening. Depression is commonly associated with obesity and often results in emotional eating behaviors. We will monitor this closely and work on CBT to help improve the non-hunger eating patterns. Referral to Psychology may be required if no improvement is seen as she continues in our clinic.  Obesity Korbin is currently in the action stage of change and her goal is to continue with weight loss efforts. I recommend Fionnuala begin the structured treatment plan as follows:  She has agreed to follow the Category 3 plan Nayzeth has been instructed to eventually work up to a goal of 150 minutes of combined cardio and  strengthening exercise per week for weight loss and overall health benefits. We discussed the following Behavioral Modification Strategies today: increasing lean protein intake, meal planning & cooking strategies and keeping healthy foods in the home.  Ronelle has agreed to join our obesity program and follow up with our clinic in 2 weeks. She was informed of the importance of frequent follow up visits to maximize her success with intensive lifestyle modifications for her multiple health conditions. She was informed we would discuss her lab results at her next visit unless there is a  critical issue that needs to be addressed sooner. Shekia agreed to keep her next visit at the agreed upon time to discuss these results.  I, Doreene Nest, am acting as transcriptionist for Dennard Nip, MD  I have reviewed the above documentation for accuracy and completeness, and I agree with the above. -Dennard Nip, MD  OBESITY BEHAVIORAL INTERVENTION VISIT  Today's visit was # 1 out of 32.  Starting weight: 246 lbs Starting date: 01/21/17 Today's weight : 246 lbs  Today's date: 01/21/2017 Total lbs lost to date: 0 (Patients must lose 7 lbs in the first 6 months to continue with counseling)   ASK: We discussed the diagnosis of obesity with Windell Moment today and Valor agreed to give [place] permission to discuss obesity behavioral modification therapy today.  ASSESS: Lakeshia has the diagnosis of obesity and her BMI today is 43.7 Suhana is in the action stage of change   ADVISE: Dawnita was educated on the multiple health risks of obesity as well as the benefit of weight loss to improve her health. She was advised of the need for long term treatment and the importance of lifestyle modifications.  AGREE: Multiple dietary modification options and treatment options were discussed and  Terrina agreed to follow the Category 3 plan We discussed the following Behavioral Modification Strategies today: increasing lean protein intake, meal planning & cooking strategies and keeping healthy foods in the home.

## 2017-01-22 LAB — COMPREHENSIVE METABOLIC PANEL
ALK PHOS: 82 IU/L (ref 39–117)
ALT: 20 IU/L (ref 0–32)
AST: 21 IU/L (ref 0–40)
Albumin/Globulin Ratio: 1.9 (ref 1.2–2.2)
Albumin: 4.3 g/dL (ref 3.5–5.5)
BUN/Creatinine Ratio: 25 — ABNORMAL HIGH (ref 9–23)
BUN: 21 mg/dL (ref 6–24)
Bilirubin Total: 0.3 mg/dL (ref 0.0–1.2)
CALCIUM: 9.3 mg/dL (ref 8.7–10.2)
CO2: 28 mmol/L (ref 20–29)
CREATININE: 0.83 mg/dL (ref 0.57–1.00)
Chloride: 101 mmol/L (ref 96–106)
GFR calc Af Amer: 92 mL/min/{1.73_m2} (ref >59)
GFR, EST NON AFRICAN AMERICAN: 80 mL/min/{1.73_m2} (ref >59)
GLOBULIN, TOTAL: 2.3 g/dL (ref 1.5–4.5)
Glucose: 100 mg/dL — ABNORMAL HIGH (ref 65–99)
POTASSIUM: 4.6 mmol/L (ref 3.5–5.2)
SODIUM: 142 mmol/L (ref 134–144)
Total Protein: 6.6 g/dL (ref 6.0–8.5)

## 2017-01-22 LAB — CBC WITH DIFFERENTIAL
BASOS ABS: 0 10*3/uL (ref 0.0–0.2)
Basos: 0 %
EOS (ABSOLUTE): 0.2 10*3/uL (ref 0.0–0.4)
Eos: 3 %
Hematocrit: 45.2 % (ref 34.0–46.6)
Hemoglobin: 14.7 g/dL (ref 11.1–15.9)
IMMATURE GRANULOCYTES: 0 %
Immature Grans (Abs): 0 10*3/uL (ref 0.0–0.1)
LYMPHS ABS: 2.7 10*3/uL (ref 0.7–3.1)
Lymphs: 34 %
MCH: 32.8 pg (ref 26.6–33.0)
MCHC: 32.5 g/dL (ref 31.5–35.7)
MCV: 101 fL — ABNORMAL HIGH (ref 79–97)
MONOS ABS: 0.7 10*3/uL (ref 0.1–0.9)
Monocytes: 9 %
NEUTROS PCT: 54 %
Neutrophils Absolute: 4.3 10*3/uL (ref 1.4–7.0)
RBC: 4.48 x10E6/uL (ref 3.77–5.28)
RDW: 14.1 % (ref 12.3–15.4)
WBC: 7.9 10*3/uL (ref 3.4–10.8)

## 2017-01-22 LAB — LIPID PANEL WITH LDL/HDL RATIO
CHOLESTEROL TOTAL: 153 mg/dL (ref 100–199)
HDL: 54 mg/dL (ref >39)
LDL CALC: 63 mg/dL (ref 0–99)
LDL/HDL RATIO: 1.2 ratio (ref 0.0–3.2)
TRIGLYCERIDES: 178 mg/dL — AB (ref 0–149)
VLDL Cholesterol Cal: 36 mg/dL (ref 5–40)

## 2017-01-22 LAB — T3: T3, Total: 137 ng/dL (ref 71–180)

## 2017-01-22 LAB — HEMOGLOBIN A1C
ESTIMATED AVERAGE GLUCOSE: 126 mg/dL
HEMOGLOBIN A1C: 6 % — AB (ref 4.8–5.6)

## 2017-01-22 LAB — VITAMIN B12: VITAMIN B 12: 1078 pg/mL (ref 232–1245)

## 2017-01-22 LAB — TSH: TSH: 3.85 u[IU]/mL (ref 0.450–4.500)

## 2017-01-22 LAB — VITAMIN D 25 HYDROXY (VIT D DEFICIENCY, FRACTURES): VIT D 25 HYDROXY: 24.4 ng/mL — AB (ref 30.0–100.0)

## 2017-01-22 LAB — T4, FREE: FREE T4: 1.11 ng/dL (ref 0.82–1.77)

## 2017-01-22 LAB — FOLATE: FOLATE: 17.6 ng/mL (ref >3.0)

## 2017-01-22 LAB — INSULIN, RANDOM: INSULIN: 42.1 u[IU]/mL — ABNORMAL HIGH (ref 2.6–24.9)

## 2017-02-05 ENCOUNTER — Ambulatory Visit (INDEPENDENT_AMBULATORY_CARE_PROVIDER_SITE_OTHER): Payer: BLUE CROSS/BLUE SHIELD | Admitting: Family Medicine

## 2017-02-05 VITALS — BP 104/71 | HR 75 | Temp 98.4°F | Ht 63.0 in | Wt 244.0 lb

## 2017-02-05 DIAGNOSIS — Z6841 Body Mass Index (BMI) 40.0 and over, adult: Secondary | ICD-10-CM | POA: Diagnosis not present

## 2017-02-05 DIAGNOSIS — E781 Pure hyperglyceridemia: Secondary | ICD-10-CM

## 2017-02-05 DIAGNOSIS — R7303 Prediabetes: Secondary | ICD-10-CM

## 2017-02-05 DIAGNOSIS — IMO0001 Reserved for inherently not codable concepts without codable children: Secondary | ICD-10-CM

## 2017-02-05 DIAGNOSIS — E559 Vitamin D deficiency, unspecified: Secondary | ICD-10-CM

## 2017-02-05 DIAGNOSIS — E669 Obesity, unspecified: Secondary | ICD-10-CM | POA: Diagnosis not present

## 2017-02-05 MED ORDER — METFORMIN HCL 500 MG PO TABS: 500.0000 mg | ORAL_TABLET | Freq: Every day | ORAL | 0 refills | Status: DC

## 2017-02-05 NOTE — Progress Notes (Signed)
Office: [phone]  /  Fax: [phone]   HPI:   Chief Complaint: OBESITY Stacey Nichols is here to discuss her progress with her obesity treatment plan. She is on the  follow the Category 3 plan and is following her eating plan approximately 80 to 90 % of the time. She states she is exercising 0 minutes 0 times per week. Stacey Nichols is status post gastric band and then changed to sleeve gastrectomy for weight loss. She followed the category 3 plan pretty well. Stacey Nichols struggled more the 1st week but did better the 2nd week with decreased hunger. Her weight is 244 lb (110.7 kg) today and has had a weight loss of 2 pounds over a period of 2 weeks since her last visit. She has lost 2 lbs since starting treatment with [place].  Vitamin D deficiency Stacey Nichols is status post weight loss surgery and has a diagnosis of vitamin D deficiency. She is currently taking prescription vit D weekly and is not yet at goal. She admits fatigue and denies nausea, vomiting or muscle weakness.  Pre-Diabetes Stacey Nichols has a diagnosis of pre-diabetes based on her elevated Hgb A1c, glucose and fasting insulin. She is not yet diabetic but was informed this puts her at greater risk of developing diabetes. She is not taking metformin currently and continues to work on diet and exercise to decrease risk of diabetes. She admits polyphagia and denies nausea or hypoglycemia.  Hypertriglyceridemia [name] has elevated triglycerides at 178. She has usually eaten significant carbohydrates but is now working on diet, exercise and weight loss to help control her triglycerides.   ALLERGIES: Allergies  Allergen Reactions   Latex Other (See Comments) and Itching   Sulfa Antibiotics Rash    [Rash] rash - paper tape OK   Tape Rash    [Rash] rash - paper tape OK    MEDICATIONS: Current Outpatient Prescriptions on File Prior to Visit  Medication Sig Dispense Refill   aspirin EC 81 MG tablet Take 81 mg by mouth daily.     azelastine (OPTIVAR)  0.05 % ophthalmic solution PLACE 1 DROP INTO AFFECTED EYE TWICE A DAY  3   baclofen (LIORESAL) 10 MG tablet Take 10 mg by mouth 2 (two) times daily.     calcium carbonate (OS-CAL - DOSED IN MG OF ELEMENTAL CALCIUM) 1250 (500 Ca) MG tablet Take by mouth.     carbamazepine (TEGRETOL XR) 200 MG 12 hr tablet   2   CARBATROL 100 MG 12 hr capsule Take 2 tablets by mouth at bedtime.  3   Cyanocobalamin (VITAMIN B-12) 1000 MCG/15ML LIQD Take 500 mcg by mouth once a week.     EPIPEN 2-PAK 0.3 MG/0.3ML SOAJ injection USE AS DIRECTED AS NEEDED  1   fluticasone (FLONASE) 50 MCG/ACT nasal spray Place 1-2 sprays into both nostrils 3 (three) times a week.      Iron TABS Take 1 tablet by mouth daily.     levocetirizine (XYZAL) 5 MG tablet Take 1 tablet (5 mg total) by mouth every evening. 90 tablet 0   meloxicam (MOBIC) 15 MG tablet TAKE 1 TABLET (15 MG TOTAL) BY MOUTH DAILY. 90 tablet 0   mometasone (NASONEX) 50 MCG/ACT nasal spray PLACE 2 SPRAYS INTO THE NOSE DAILY. 17 g 2   montelukast (SINGULAIR) 10 MG tablet Take 1 tablet (10 mg total) by mouth daily. 90 tablet 1   Multiple Vitamins-Minerals (MULTI COMPLETE PO) Take 1 tablet by mouth daily.     rosuvastatin (CRESTOR) 10 MG  tablet Take 1 tablet (10 mg total) by mouth at bedtime. 90 tablet 0   Vitamin D, Ergocalciferol, (DRISDOL) 50000 units CAPS capsule Take 1 capsule (50,000 Units total) by mouth once a week. For 12 weeks 12 capsule 0   No current facility-administered medications on file prior to visit.     PAST MEDICAL HISTORY: Past Medical History:  Diagnosis Date   Anemia    hx of   Anxiety    Arthritis    severe   Cancer ([place])    leukemia   Constipation    Depression    GERD (gastroesophageal reflux disease)    H/O Bell's palsy    H/O hiatal hernia    Headache    occasional   High triglycerides    History of cancer chemotherapy    Leukemia ([facility])    Pneumonia    hx of    Rheumatoid arthritis ([place])     Sleep apnea    Stroke ([place])    "mini stroke-caused bells palsy for 1 month"    PAST SURGICAL HISTORY: Past Surgical History:  Procedure Laterality Date   ABDOMINAL HYSTERECTOMY  2001   LAPAROSCOPIC GASTRIC BAND REMOVAL WITH LAPAROSCOPIC GASTRIC SLEEVE RESECTION     LAPAROSCOPIC GASTRIC BANDING  2000   performed in [place]   TUBAL LIGATION  1996   UPPER GI ENDOSCOPY N/A 04/26/2014   Procedure: UPPER GI ENDOSCOPY;  Surgeon: Pedro Earls, MD;  Location: [facility];  Service: General;  Laterality: N/A;    SOCIAL HISTORY: Social History  Substance Use Topics   Smoking status: Former Smoker    Years: 30.00    Types: Cigarettes    Quit date: 07/15/2005   Smokeless tobacco: Never Used   Alcohol use Yes     Comment: occasional    FAMILY HISTORY: Family History  Problem Relation Age of Onset   Heart disease Father    Hyperlipidemia Father    Obesity Father     ROS: Review of Systems  Constitutional: Positive for malaise/fatigue and weight loss.  Gastrointestinal: Negative for nausea and vomiting.  Musculoskeletal:       Negative muscle weakness  Endo/Heme/Allergies:       Polyphagia Negative hypoglycemia    PHYSICAL EXAM: Blood pressure 104/71, pulse 75, temperature 98.4 F (36.9 C), temperature source Oral, height 5\' 3"  (1.6 m), weight 244 lb (110.7 kg), SpO2 97 %. Body mass index is 43.22 kg/m. Physical Exam  Constitutional: She is oriented to person, place, and time. She appears well-developed and well-nourished.  Cardiovascular: Normal rate.   Pulmonary/Chest: Effort normal.  Musculoskeletal: Normal range of motion.  Neurological: She is oriented to person, place, and time.  Skin: Skin is warm and dry.  Psychiatric: She has a normal mood and affect. Her behavior is normal.  Vitals reviewed.   RECENT LABS AND TESTS: BMET    Component Value Date/Time   NA 142 01/21/2017 1128   NA 142 01/14/2013 1757   K 4.6 01/21/2017 1128   K 4.6 01/14/2013  1757   CL 101 01/21/2017 1128   CL 107 01/14/2013 1757   CO2 28 01/21/2017 1128   CO2 29 01/14/2013 1757   GLUCOSE 100 (H) 01/21/2017 1128   GLUCOSE 106 (H) 12/25/2016 1201   GLUCOSE 95 01/14/2013 1757   BUN 21 01/21/2017 1128   BUN 11 01/14/2013 1757   CREATININE 0.83 01/21/2017 1128   CREATININE 0.76 12/25/2016 1201   CALCIUM 9.3 01/21/2017 1128   CALCIUM 9.2 01/14/2013 1757  GFRNONAA 80 01/21/2017 1128   GFRNONAA 89 12/25/2016 1201   GFRAA 92 01/21/2017 1128   GFRAA >89 12/25/2016 1201   Lab Results  Component Value Date   HGBA1C 6.0 (H) 01/21/2017   HGBA1C 6.0 05/27/2016   HGBA1C 6.2 (H) 03/01/2014   Lab Results  Component Value Date   INSULIN 42.1 (H) 01/21/2017   CBC    Component Value Date/Time   WBC 7.9 01/21/2017 1128   WBC 8.3 08/27/2016 0904   RBC 4.48 01/21/2017 1128   RBC 4.48 08/27/2016 0904   HGB 14.7 01/21/2017 1128   HCT 45.2 01/21/2017 1128   PLT 196 08/27/2016 0904   PLT 214 06/20/2015 1141   MCV 101 (H) 01/21/2017 1128   MCV 96 01/15/2013 0522   MCH 32.8 01/21/2017 1128   MCH 32.6 08/27/2016 0904   MCHC 32.5 01/21/2017 1128   MCHC 33.2 08/27/2016 0904   RDW 14.1 01/21/2017 1128   RDW 14.3 01/15/2013 0522   LYMPHSABS 2.7 01/21/2017 1128   LYMPHSABS 3.6 01/15/2013 0522   MONOABS 664 08/27/2016 0904   MONOABS 0.8 01/15/2013 0522   EOSABS 0.2 01/21/2017 1128   EOSABS 0.3 01/15/2013 0522   BASOSABS 0.0 01/21/2017 1128   BASOSABS 0.0 01/15/2013 0522   Iron/TIBC/Ferritin/ %Sat No results found for: IRON, TIBC, FERRITIN, IRONPCTSAT Lipid Panel     Component Value Date/Time   CHOL 153 01/21/2017 1128   CHOL 190 01/15/2013 0522   TRIG 178 (H) 01/21/2017 1128   TRIG 307 (H) 01/15/2013 0522   HDL 54 01/21/2017 1128   HDL 44 01/15/2013 0522   CHOLHDL 2.1 08/27/2016 0904   VLDL 21 08/27/2016 0904   VLDL 61 (H) 01/15/2013 0522   LDLCALC 63 01/21/2017 1128   LDLCALC 85 01/15/2013 0522   Hepatic Function Panel     Component Value  Date/Time   PROT 6.6 01/21/2017 1128   PROT 7.7 01/14/2013 1757   ALBUMIN 4.3 01/21/2017 1128   ALBUMIN 3.8 01/14/2013 1757   AST 21 01/21/2017 1128   AST 25 01/14/2013 1757   ALT 20 01/21/2017 1128   ALT 39 01/14/2013 1757   ALKPHOS 82 01/21/2017 1128   ALKPHOS 100 01/14/2013 1757   BILITOT 0.3 01/21/2017 1128   BILITOT 0.3 01/14/2013 1757      Component Value Date/Time   TSH 3.850 01/21/2017 1128   TSH 2.09 12/25/2016 1201   TSH 3.163 03/01/2014 1201    ASSESSMENT AND PLAN: Prediabetes - Plan: metFORMIN (GLUCOPHAGE) 500 MG tablet  Vitamin D deficiency  High triglycerides  Class 3 obesity with serious comorbidity and body mass index (BMI) of 40.0 to 44.9 in adult, unspecified obesity type ([place])  PLAN:  Vitamin D Deficiency Arthur was informed that low vitamin D levels contributes to fatigue and are associated with obesity, breast, and colon cancer. She agrees to continue to take prescription Vit D @50 ,000 IU every week and will follow up for routine testing of vitamin D, at least 2-3 times per year. She was informed of the risk of over-replacement of vitamin D and agrees to not increase her dose unless he discusses this with [place] first. She agrees to continue with weight loss as this also helps to improve vitamin D serum concentrations and will follow up as directed.  Pre-Diabetes Erricka will continue to work on weight loss, exercise, and decreasing simple carbohydrates in her diet to help decrease the risk of diabetes. We dicussed metformin including benefits and risks. She was informed that eating too  many simple carbohydrates or too many calories at one sitting increases the likelihood of GI side effects. Aivy agrees to start to take metformin for now and a prescription was written today for metformin 500 mg every morning #30 with no refills. We will re-check labs in 3 months and Shaterra agreed to follow up with [place] as directed to monitor her progress.  Hypertriglyceridemia Rital  will continue to work on weight loss, exercise and decreasing simple carbohydrates in her diet to help control her triglycerides and will follow up with our clinic in 2 to 3 weeks.   Obesity Aayana is currently in the action stage of change. As such, her goal is to continue with weight loss efforts She has agreed to follow the Category 3 plan Letoya has been instructed to work up to a goal of 150 minutes of combined cardio and strengthening exercise per week for weight loss and overall health benefits. We discussed the following Behavioral Modification Strategies today: meal planning & cooking strategies, increasing lean protein intake and decreasing simple carbohydrates   Arnesia has agreed to follow up with our clinic in 2 to 3 weeks. She was informed of the importance of frequent follow up visits to maximize her success with intensive lifestyle modifications for her multiple health conditions.  I, Doreene Nest, am acting as transcriptionist for Dennard Nip, MD  I have reviewed the above documentation for accuracy and completeness, and I agree with the above. -Dennard Nip, MD   OBESITY BEHAVIORAL INTERVENTION VISIT  Today's visit was # 2 out of 47.  Starting weight: 246 lbs Starting date: 01/21/17 Today's weight : 244 lbs Today's date: 02/05/2017 Total lbs lost to date: 2 (Patients must lose 7 lbs in the first 6 months to continue with counseling)   ASK: We discussed the diagnosis of obesity with Windell Moment today and Almedia agreed to give [place] permission to discuss obesity behavioral modification therapy today.  ASSESS: Vernella has the diagnosis of obesity and her BMI today is 13.3 Nylia is in the action stage of change   ADVISE: Paetyn was educated on the multiple health risks of obesity as well as the benefit of weight loss to improve her health. She was advised of the need for long term treatment and the importance of lifestyle modifications.  AGREE: Multiple dietary  modification options and treatment options were discussed and  Varie agreed to follow the Category 3 plan We discussed the following Behavioral Modification Strategies today: meal planning & cooking strategies, increasing lean protein intake and decreasing simple carbohydrates

## 2017-02-17 ENCOUNTER — Other Ambulatory Visit: Payer: Self-pay | Admitting: Family Medicine

## 2017-02-17 DIAGNOSIS — E782 Mixed hyperlipidemia: Secondary | ICD-10-CM

## 2017-02-17 NOTE — Telephone Encounter (Signed)
crestor 5 mg request received; it looks like Dr. Manuella Ghazi and Dr. Leafy Ro both document 10 mg 5 mg strength DENIED

## 2017-02-19 ENCOUNTER — Ambulatory Visit (INDEPENDENT_AMBULATORY_CARE_PROVIDER_SITE_OTHER): Payer: BLUE CROSS/BLUE SHIELD | Admitting: Family Medicine

## 2017-02-19 VITALS — BP 110/72 | HR 71 | Temp 98.5°F | Ht 63.0 in | Wt 242.0 lb

## 2017-02-19 DIAGNOSIS — Z6841 Body Mass Index (BMI) 40.0 and over, adult: Secondary | ICD-10-CM

## 2017-02-19 DIAGNOSIS — E669 Obesity, unspecified: Secondary | ICD-10-CM

## 2017-02-19 DIAGNOSIS — M174 Other bilateral secondary osteoarthritis of knee: Secondary | ICD-10-CM | POA: Diagnosis not present

## 2017-02-19 DIAGNOSIS — IMO0001 Reserved for inherently not codable concepts without codable children: Secondary | ICD-10-CM

## 2017-02-20 NOTE — Progress Notes (Signed)
Office: [phone]  /  Fax: [phone]   HPI:   Chief Complaint: OBESITY Stacey Nichols is here to discuss Stacey Nichols progress with Stacey Nichols obesity treatment plan. Stacey Nichols is on the  follow the Category 3 plan and is following Stacey Nichols eating plan approximately 98 % of the time. Stacey Nichols states Stacey Nichols is exercising 0 minutes 0 times per week. Stacey Nichols continues to do well with weight loss on Stacey Nichols Cat 3 plan but is very bored with breakfast and would like more options. Stacey Nichols is not exercising yet due to Stacey Nichols knee pain.   Stacey Nichols weight is 242 lb (109.8 kg) today and has had a weight loss of 2 pounds over a period of 2 weeks since Stacey Nichols last visit. Stacey Nichols has lost 4 lbs since starting treatment with [place].  Bilateral Knee Osteoarthritis Stacey Nichols stopped Stacey Nichols mobic and states OTC ibuprofen 800 mg every 6 hours for pain as Stacey Nichols thinks this works better. Denies abdominal pain and Stacey Nichols last renal function was within normal range.    ALLERGIES: Allergies  Allergen Reactions  . Latex Other (See Comments) and Itching  . Sulfa Antibiotics Rash    [Rash] rash - paper tape OK  . Tape Rash    [Rash] rash - paper tape OK    MEDICATIONS: Current Outpatient Prescriptions on File Prior to Visit  Medication Sig Dispense Refill  . azelastine (OPTIVAR) 0.05 % ophthalmic solution PLACE 1 DROP INTO AFFECTED EYE TWICE A DAY  3  . baclofen (LIORESAL) 10 MG tablet Take 10 mg by mouth 2 (two) times daily.    . calcium carbonate (OS-CAL - DOSED IN MG OF ELEMENTAL CALCIUM) 1250 (500 Ca) MG tablet Take by mouth.    . carbamazepine (TEGRETOL XR) 200 MG 12 hr tablet   2  . CARBATROL 100 MG 12 hr capsule Take 2 tablets by mouth at bedtime.  3  . Cyanocobalamin (VITAMIN B-12) 1000 MCG/15ML LIQD Take 500 mcg by mouth once a week.    . EPIPEN 2-PAK 0.3 MG/0.3ML SOAJ injection USE AS DIRECTED AS NEEDED  1  . fluticasone (FLONASE) 50 MCG/ACT nasal spray Place 1-2 sprays into both nostrils 3 (three) times a week.     . Iron TABS Take 1 tablet by mouth daily.    Stacey Nichols  levocetirizine (XYZAL) 5 MG tablet Take 1 tablet (5 mg total) by mouth every evening. 90 tablet 0  . metFORMIN (GLUCOPHAGE) 500 MG tablet Take 1 tablet (500 mg total) by mouth daily with breakfast. 30 tablet 0  . mometasone (NASONEX) 50 MCG/ACT nasal spray PLACE 2 SPRAYS INTO THE NOSE DAILY. 17 g 2  . montelukast (SINGULAIR) 10 MG tablet Take 1 tablet (10 mg total) by mouth daily. 90 tablet 1  . Multiple Vitamins-Minerals (MULTI COMPLETE PO) Take 1 tablet by mouth daily.    . rosuvastatin (CRESTOR) 10 MG tablet Take 1 tablet (10 mg total) by mouth at bedtime. 90 tablet 0  . Vitamin D, Ergocalciferol, (DRISDOL) 50000 units CAPS capsule Take 1 capsule (50,000 Units total) by mouth once a week. For 12 weeks 12 capsule 0  . aspirin EC 81 MG tablet Take 81 mg by mouth daily.    . meloxicam (MOBIC) 15 MG tablet TAKE 1 TABLET (15 MG TOTAL) BY MOUTH DAILY. (Stacey Nichols not taking: Reported on 02/19/2017) 90 tablet 0   No current facility-administered medications on file prior to visit.     PAST MEDICAL HISTORY: Past Medical History:  Diagnosis Date  . Anemia    hx of  .  Anxiety   . Arthritis    severe  . Cancer ([place])    leukemia  . Constipation   . Depression   . GERD (gastroesophageal reflux disease)   . H/O Bell's palsy   . H/O hiatal hernia   . Headache    occasional  . High triglycerides   . History of cancer chemotherapy   . Leukemia ([place])   . Pneumonia    hx of   . Rheumatoid arthritis ([place])   . Sleep apnea   . Stroke ([place])    "mini stroke-caused bells palsy for 1 month"    PAST SURGICAL HISTORY: Past Surgical History:  Procedure Laterality Date  . ABDOMINAL HYSTERECTOMY  2001  . LAPAROSCOPIC GASTRIC BAND REMOVAL WITH LAPAROSCOPIC GASTRIC SLEEVE RESECTION    . LAPAROSCOPIC GASTRIC BANDING  2000   performed in [place]  . TUBAL LIGATION  1996  . UPPER GI ENDOSCOPY N/A 04/26/2014   Procedure: UPPER GI ENDOSCOPY;  Surgeon: Pedro Earls, MD;  Location: [facility];  Service:  General;  Laterality: N/A;    SOCIAL HISTORY: Social History  Substance Use Topics  . Smoking status: Former Smoker    Years: 30.00    Types: Cigarettes    Quit date: 07/15/2005  . Smokeless tobacco: Never Used  . Alcohol use Yes     Comment: occasional    FAMILY HISTORY: Family History  Problem Relation Age of Onset  . Heart disease Father   . Hyperlipidemia Father   . Obesity Father     ROS: Review of Systems  Constitutional: Positive for weight loss.  Musculoskeletal:       Knee pain  All other systems reviewed and are negative.   PHYSICAL EXAM: Blood pressure 110/72, pulse 71, temperature 98.5 F (36.9 C), temperature source Oral, height 5\' 3"  (1.6 m), weight 242 lb (109.8 kg), SpO2 94 %. Body mass index is 42.87 kg/m. Physical Exam  Constitutional: Stacey Nichols is oriented to person, place, and time. Stacey Nichols appears well-developed and well-nourished.  HENT:  Head: Normocephalic and atraumatic.  Eyes: EOM are normal.  Neck: Normal range of motion.  Pulmonary/Chest: Effort normal.  Musculoskeletal: Normal range of motion.  Neurological: Stacey Nichols is alert and oriented to person, place, and time.  Skin: Skin is warm and dry.  Psychiatric: Stacey Nichols has a normal mood and affect.  Vitals reviewed.   RECENT LABS AND TESTS: BMET    Component Value Date/Time   NA 142 01/21/2017 1128   NA 142 01/14/2013 1757   K 4.6 01/21/2017 1128   K 4.6 01/14/2013 1757   CL 101 01/21/2017 1128   CL 107 01/14/2013 1757   CO2 28 01/21/2017 1128   CO2 29 01/14/2013 1757   GLUCOSE 100 (H) 01/21/2017 1128   GLUCOSE 106 (H) 12/25/2016 1201   GLUCOSE 95 01/14/2013 1757   BUN 21 01/21/2017 1128   BUN 11 01/14/2013 1757   CREATININE 0.83 01/21/2017 1128   CREATININE 0.76 12/25/2016 1201   CALCIUM 9.3 01/21/2017 1128   CALCIUM 9.2 01/14/2013 1757   GFRNONAA 80 01/21/2017 1128   GFRNONAA 89 12/25/2016 1201   GFRAA 92 01/21/2017 1128   GFRAA >89 12/25/2016 1201   Lab Results  Component Value Date    HGBA1C 6.0 (H) 01/21/2017   HGBA1C 6.0 05/27/2016   HGBA1C 6.2 (H) 03/01/2014   Lab Results  Component Value Date   INSULIN 42.1 (H) 01/21/2017   CBC    Component Value Date/Time   WBC 7.9 01/21/2017 1128  WBC 8.3 08/27/2016 0904   RBC 4.48 01/21/2017 1128   RBC 4.48 08/27/2016 0904   HGB 14.7 01/21/2017 1128   HCT 45.2 01/21/2017 1128   PLT 196 08/27/2016 0904   PLT 214 06/20/2015 1141   MCV 101 (H) 01/21/2017 1128   MCV 96 01/15/2013 0522   MCH 32.8 01/21/2017 1128   MCH 32.6 08/27/2016 0904   MCHC 32.5 01/21/2017 1128   MCHC 33.2 08/27/2016 0904   RDW 14.1 01/21/2017 1128   RDW 14.3 01/15/2013 0522   LYMPHSABS 2.7 01/21/2017 1128   LYMPHSABS 3.6 01/15/2013 0522   MONOABS 664 08/27/2016 0904   MONOABS 0.8 01/15/2013 0522   EOSABS 0.2 01/21/2017 1128   EOSABS 0.3 01/15/2013 0522   BASOSABS 0.0 01/21/2017 1128   BASOSABS 0.0 01/15/2013 0522   Iron/TIBC/Ferritin/ %Sat No results found for: IRON, TIBC, FERRITIN, IRONPCTSAT Lipid Panel     Component Value Date/Time   CHOL 153 01/21/2017 1128   CHOL 190 01/15/2013 0522   TRIG 178 (H) 01/21/2017 1128   TRIG 307 (H) 01/15/2013 0522   HDL 54 01/21/2017 1128   HDL 44 01/15/2013 0522   CHOLHDL 2.1 08/27/2016 0904   VLDL 21 08/27/2016 0904   VLDL 61 (H) 01/15/2013 0522   LDLCALC 63 01/21/2017 1128   LDLCALC 85 01/15/2013 0522   Hepatic Function Panel     Component Value Date/Time   PROT 6.6 01/21/2017 1128   PROT 7.7 01/14/2013 1757   ALBUMIN 4.3 01/21/2017 1128   ALBUMIN 3.8 01/14/2013 1757   AST 21 01/21/2017 1128   AST 25 01/14/2013 1757   ALT 20 01/21/2017 1128   ALT 39 01/14/2013 1757   ALKPHOS 82 01/21/2017 1128   ALKPHOS 100 01/14/2013 1757   BILITOT 0.3 01/21/2017 1128   BILITOT 0.3 01/14/2013 1757      Component Value Date/Time   TSH 3.850 01/21/2017 1128   TSH 2.09 12/25/2016 1201   TSH 3.163 03/01/2014 1201    ASSESSMENT AND PLAN: Other secondary osteoarthritis of both knees  Class 3  obesity with serious comorbidity and body mass index (BMI) of 40.0 to 44.9 in adult, unspecified obesity type ([facility])  PLAN:  Bilateral Knee Osteoarthritis Stacey Nichols will decrease ibuprofen to 400 mg every 6 hours as needed. Stacey Nichols was advised to make sure to take with food to help with gastritis. Continue weight loss to help with osteoarthritis.   Obesity Stacey Nichols is currently in the action stage of change. As such, Stacey Nichols goal is to continue with weight loss efforts Stacey Nichols has agreed to follow the Category 3 plan + breakfast options Stacey Nichols has been instructed to work up to a goal of 150 minutes of combined cardio and strengthening exercise per week for weight loss and overall health benefits. We discussed the following Behavioral Modification Stratagies today: increasing lean protein intake, decreasing simple carbohydrates  and work on meal planning and easy cooking plans  Stacey Nichols has agreed to follow up with our clinic in 2 weeks. Stacey Nichols was informed of the importance of frequent follow up visits to maximize Stacey Nichols success with intensive lifestyle modifications for Stacey Nichols multiple health conditions.  Nani Gasser, am acting as transcriptionist for Dennard Nip, MD  I have reviewed the above documentation for accuracy and completeness, and I agree with the above. -Dennard Nip, MD

## 2017-03-03 ENCOUNTER — Ambulatory Visit (INDEPENDENT_AMBULATORY_CARE_PROVIDER_SITE_OTHER): Payer: BLUE CROSS/BLUE SHIELD | Admitting: Physician Assistant

## 2017-03-03 VITALS — BP 123/83 | HR 63 | Temp 98.6°F | Ht 63.0 in | Wt 242.0 lb

## 2017-03-03 DIAGNOSIS — E669 Obesity, unspecified: Secondary | ICD-10-CM

## 2017-03-03 DIAGNOSIS — R7303 Prediabetes: Secondary | ICD-10-CM

## 2017-03-03 DIAGNOSIS — M1990 Unspecified osteoarthritis, unspecified site: Secondary | ICD-10-CM | POA: Diagnosis not present

## 2017-03-03 DIAGNOSIS — Z6841 Body Mass Index (BMI) 40.0 and over, adult: Secondary | ICD-10-CM | POA: Diagnosis not present

## 2017-03-03 DIAGNOSIS — IMO0001 Reserved for inherently not codable concepts without codable children: Secondary | ICD-10-CM

## 2017-03-03 MED ORDER — METFORMIN HCL 500 MG PO TABS: 500.0000 mg | ORAL_TABLET | Freq: Two times a day (BID) | ORAL | 0 refills | Status: DC

## 2017-03-03 MED ORDER — ACETAMINOPHEN ER 650 MG PO TBCR: 650.0000 mg | EXTENDED_RELEASE_TABLET | Freq: Four times a day (QID) | ORAL | 0 refills | Status: DC | PRN

## 2017-03-03 NOTE — Progress Notes (Addendum)
Office: [phone]  /  Fax: [phone]   HPI:   Chief Complaint: OBESITY Stacey Nichols is here to discuss her progress with her obesity treatment plan. She is on the Category 3 plan and is following her eating plan approximately 60 % of the time. She states she is exercising 0 minutes 0 times per week. Stacey Nichols maintained her weight and noticed increased hunger in the evening. Her weight is 242 lb (109.8 kg) today and has maintained weight over a period of 2 weeks since her last visit. She has lost 4 lbs since starting treatment with [place].  Pre-Diabetes Stacey Nichols has a diagnosis of pre-diabetes based on her elevated Hgb A1c and was informed this puts her at greater risk of developing diabetes. She is taking metformin currently and continues to work on diet and exercise to decrease risk of diabetes. She noticed polyphagia in the evening and denies nausea or hypoglycemia.  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 She is on multiple NSAID such as Ibuprofen, Naproxen, and Mobic.  She also takes daily Aspirin for recurrent Bell's Palsy and is concerned about the risk of ulcers and would like different treatment options. Denies any recent injury.    ALLERGIES: Allergies  Allergen Reactions  . Latex Other (See Comments) and Itching  . Sulfa Antibiotics Rash    [Rash] rash - paper tape OK  . Tape Rash    [Rash] rash - paper tape OK    MEDICATIONS: Current Outpatient Prescriptions on File Prior to Visit  Medication Sig Dispense Refill  . aspirin EC 81 MG tablet Take 81 mg by mouth daily.    Marland Kitchen azelastine (OPTIVAR) 0.05 % ophthalmic solution PLACE 1 DROP INTO AFFECTED EYE TWICE A DAY  3  . baclofen (LIORESAL) 10 MG tablet Take 10 mg by mouth 2 (two) times daily.    . calcium carbonate (OS-CAL - DOSED IN MG OF ELEMENTAL CALCIUM) 1250 (500 Ca) MG tablet Take by mouth.    . carbamazepine (TEGRETOL XR) 200 MG 12 hr tablet   2  . CARBATROL  100 MG 12 hr capsule Take 2 tablets by mouth at bedtime.  3  . Cyanocobalamin (VITAMIN B-12) 1000 MCG/15ML LIQD Take 500 mcg by mouth once a week.    . EPIPEN 2-PAK 0.3 MG/0.3ML SOAJ injection USE AS DIRECTED AS NEEDED  1  . fluticasone (FLONASE) 50 MCG/ACT nasal spray Place 1-2 sprays into both nostrils 3 (three) times a week.     Marland Kitchen ibuprofen (ADVIL,MOTRIN) 200 MG tablet Take 800 mg by mouth every 6 (six) hours as needed.    . Iron TABS Take 1 tablet by mouth daily.    Marland Kitchen levocetirizine (XYZAL) 5 MG tablet Take 1 tablet (5 mg total) by mouth every evening. 90 tablet 0  . meloxicam (MOBIC) 15 MG tablet TAKE 1 TABLET (15 MG TOTAL) BY MOUTH DAILY. 90 tablet 0  . mometasone (NASONEX) 50 MCG/ACT nasal spray PLACE 2 SPRAYS INTO THE NOSE DAILY. 17 g 2  . montelukast (SINGULAIR) 10 MG tablet Take 1 tablet (10 mg total) by mouth daily. 90 tablet 1  . Multiple Vitamins-Minerals (MULTI COMPLETE PO) Take 1 tablet by mouth daily.    . rosuvastatin (CRESTOR) 10 MG tablet Take 1 tablet (10 mg total) by mouth at bedtime. 90 tablet 0  . Vitamin D, Ergocalciferol, (DRISDOL) 50000 units CAPS capsule Take 1 capsule (50,000 Units total) by mouth once a week. For 12 weeks 12 capsule  0   No current facility-administered medications on file prior to visit.     PAST MEDICAL HISTORY: Past Medical History:  Diagnosis Date  . Anemia    hx of  . Anxiety   . Arthritis    severe  . Cancer ([place])    leukemia  . Constipation   . Depression   . GERD (gastroesophageal reflux disease)   . H/O Bell's palsy   . H/O hiatal hernia   . Headache    occasional  . High triglycerides   . History of cancer chemotherapy   . Leukemia ([place])   . Pneumonia    hx of   . Rheumatoid arthritis ([place])   . Sleep apnea   . Stroke ([place])    "mini stroke-caused bells palsy for 1 month"    PAST SURGICAL HISTORY: Past Surgical History:  Procedure Laterality Date  . ABDOMINAL HYSTERECTOMY  2001  . LAPAROSCOPIC GASTRIC BAND REMOVAL  WITH LAPAROSCOPIC GASTRIC SLEEVE RESECTION    . LAPAROSCOPIC GASTRIC BANDING  2000   performed in [place]  . TUBAL LIGATION  1996  . UPPER GI ENDOSCOPY N/A 04/26/2014   Procedure: UPPER GI ENDOSCOPY;  Surgeon: Pedro Earls, MD;  Location: [facility];  Service: General;  Laterality: N/A;    SOCIAL HISTORY: Social History  Substance Use Topics  . Smoking status: Former Smoker    Years: 30.00    Types: Cigarettes    Quit date: 07/15/2005  . Smokeless tobacco: Never Used  . Alcohol use Yes     Comment: occasional    FAMILY HISTORY: Family History  Problem Relation Age of Onset  . Heart disease Father   . Hyperlipidemia Father   . Obesity Father     ROS: Review of Systems  Constitutional: Negative for malaise/fatigue.  Gastrointestinal: Negative for nausea.  Endo/Heme/Allergies:       Polyphagia Negative hypoglycemia    PHYSICAL EXAM: Blood pressure 123/83, pulse 63, temperature 98.6 F (37 C), temperature source Oral, height 5\' 3"  (1.6 m), weight 242 lb (109.8 kg), SpO2 96 %. Body mass index is 42.87 kg/m. Physical Exam  Constitutional: She is oriented to person, place, and time. She appears well-developed and well-nourished.  Cardiovascular: Normal rate.   Pulmonary/Chest: Effort normal.  Musculoskeletal: Normal range of motion.  Neurological: She is oriented to person, place, and time.  Skin: Skin is warm and dry.  Psychiatric: She has a normal mood and affect. Her behavior is normal.  Vitals reviewed.   RECENT LABS AND TESTS: BMET    Component Value Date/Time   NA 142 01/21/2017 1128   NA 142 01/14/2013 1757   K 4.6 01/21/2017 1128   K 4.6 01/14/2013 1757   CL 101 01/21/2017 1128   CL 107 01/14/2013 1757   CO2 28 01/21/2017 1128   CO2 29 01/14/2013 1757   GLUCOSE 100 (H) 01/21/2017 1128   GLUCOSE 106 (H) 12/25/2016 1201   GLUCOSE 95 01/14/2013 1757   BUN 21 01/21/2017 1128   BUN 11 01/14/2013 1757   CREATININE 0.83 01/21/2017 1128   CREATININE 0.76  12/25/2016 1201   CALCIUM 9.3 01/21/2017 1128   CALCIUM 9.2 01/14/2013 1757   GFRNONAA 80 01/21/2017 1128   GFRNONAA 89 12/25/2016 1201   GFRAA 92 01/21/2017 1128   GFRAA >89 12/25/2016 1201   Lab Results  Component Value Date   HGBA1C 6.0 (H) 01/21/2017   HGBA1C 6.0 05/27/2016   HGBA1C 6.2 (H) 03/01/2014   Lab Results  Component Value  Date   INSULIN 42.1 (H) 01/21/2017   CBC    Component Value Date/Time   WBC 7.9 01/21/2017 1128   WBC 8.3 08/27/2016 0904   RBC 4.48 01/21/2017 1128   RBC 4.48 08/27/2016 0904   HGB 14.7 01/21/2017 1128   HCT 45.2 01/21/2017 1128   PLT 196 08/27/2016 0904   PLT 214 06/20/2015 1141   MCV 101 (H) 01/21/2017 1128   MCV 96 01/15/2013 0522   MCH 32.8 01/21/2017 1128   MCH 32.6 08/27/2016 0904   MCHC 32.5 01/21/2017 1128   MCHC 33.2 08/27/2016 0904   RDW 14.1 01/21/2017 1128   RDW 14.3 01/15/2013 0522   LYMPHSABS 2.7 01/21/2017 1128   LYMPHSABS 3.6 01/15/2013 0522   MONOABS 664 08/27/2016 0904   MONOABS 0.8 01/15/2013 0522   EOSABS 0.2 01/21/2017 1128   EOSABS 0.3 01/15/2013 0522   BASOSABS 0.0 01/21/2017 1128   BASOSABS 0.0 01/15/2013 0522   Iron/TIBC/Ferritin/ %Sat No results found for: IRON, TIBC, FERRITIN, IRONPCTSAT Lipid Panel     Component Value Date/Time   CHOL 153 01/21/2017 1128   CHOL 190 01/15/2013 0522   TRIG 178 (H) 01/21/2017 1128   TRIG 307 (H) 01/15/2013 0522   HDL 54 01/21/2017 1128   HDL 44 01/15/2013 0522   CHOLHDL 2.1 08/27/2016 0904   VLDL 21 08/27/2016 0904   VLDL 61 (H) 01/15/2013 0522   LDLCALC 63 01/21/2017 1128   LDLCALC 85 01/15/2013 0522   Hepatic Function Panel     Component Value Date/Time   PROT 6.6 01/21/2017 1128   PROT 7.7 01/14/2013 1757   ALBUMIN 4.3 01/21/2017 1128   ALBUMIN 3.8 01/14/2013 1757   AST 21 01/21/2017 1128   AST 25 01/14/2013 1757   ALT 20 01/21/2017 1128   ALT 39 01/14/2013 1757   ALKPHOS 82 01/21/2017 1128   ALKPHOS 100 01/14/2013 1757   BILITOT 0.3 01/21/2017  1128   BILITOT 0.3 01/14/2013 1757      Component Value Date/Time   TSH 3.850 01/21/2017 1128   TSH 2.09 12/25/2016 1201   TSH 3.163 03/01/2014 1201    ASSESSMENT AND PLAN: Prediabetes - Plan: metFORMIN (GLUCOPHAGE) 500 MG tablet  Arthritis - Biliteral of knees - Plan: acetaminophen (TYLENOL 8 HOUR) 650 MG CR tablet  Class 3 obesity with serious comorbidity and body mass index (BMI) of 40.0 to 44.9 in adult, unspecified obesity type ([place])  PLAN:  Pre-Diabetes Stacey Nichols will continue to work on weight loss, exercise, and decreasing simple carbohydrates in her diet to help decrease the risk of diabetes. We dicussed metformin including benefits and risks. She was informed that eating too many simple carbohydrates or too many calories at one sitting increases the likelihood of GI side effects. Sabrie agrees to increase metformin to 500 mg bid #60 with no refills. Stacey Nichols agreed to follow up with [place] as directed to monitor her progress.  Arthritis of Bilateral Knees Stacey Nichols agrees to start OTC Tylenol 650 mg every 6 hours as needed, she is to follow up with her PCP for further management and workup of her arthritis including images. She agrees to follow up with our clinic in 2 weeks.   Obesity Stacey Nichols is currently in the action stage of change. As such, her goal is to continue with weight loss efforts She has agreed to follow the Category 3 plan Stacey Nichols has been instructed to work up to a goal of 150 minutes of combined cardio and strengthening exercise per week for weight loss  and overall health benefits. We discussed the following Behavioral Modification Strategies today: increasing lean protein intake and meal planning & cooking strategies  Stacey Nichols has agreed to follow up with our clinic in 2 weeks. She was informed of the importance of frequent follow up visits to maximize her success with intensive lifestyle modifications for her multiple health conditions.  I, Stacey Nichols, am acting as  transcriptionist for Stacey Duverney, PA-C  I have reviewed the above documentation for accuracy and completeness, and I agree with the above. -Stacey Duverney, PA-C    OBESITY BEHAVIORAL INTERVENTION VISIT  Today's visit was # 4 out of 22.  Starting weight: 246 lbs Starting date: 01/21/17 Today's weight : 242 lbs Today's date: 03/03/2017 Total lbs lost to date: 4 (Patients must lose 7 lbs in the first 6 months to continue with counseling)   ASK: We discussed the diagnosis of obesity with Windell Moment today and Saja agreed to give [place] permission to discuss obesity behavioral modification therapy today.  ASSESS: Veta has the diagnosis of obesity and her BMI today is 42.88 Yaelis is in the action stage of change   ADVISE: Faiga was educated on the multiple health risks of obesity as well as the benefit of weight loss to improve her health. She was advised of the need for long term treatment and the importance of lifestyle modifications.  AGREE: Multiple dietary modification options and treatment options were discussed and  Anastaisa agreed to follow the Category 3 plan We discussed the following Behavioral Modification Strategies today: increasing lean protein intake and meal planning & cooking strategies

## 2017-03-04 ENCOUNTER — Telehealth: Payer: Self-pay | Admitting: Family Medicine

## 2017-03-04 NOTE — Telephone Encounter (Signed)
Pt came in today with Stacey Nichols [birth year] wanted to establish with you.  I explained that you were not taking any new patient.  But they wanted me to check with you.

## 2017-03-07 NOTE — Telephone Encounter (Signed)
Ok to add at 30 min slot.  thanks

## 2017-03-11 ENCOUNTER — Other Ambulatory Visit: Payer: Self-pay | Admitting: Family Medicine

## 2017-03-11 DIAGNOSIS — E782 Mixed hyperlipidemia: Secondary | ICD-10-CM

## 2017-03-11 NOTE — Telephone Encounter (Signed)
Left message asking pt to call office  °

## 2017-03-12 ENCOUNTER — Ambulatory Visit: Payer: BLUE CROSS/BLUE SHIELD | Admitting: Skilled Nursing Facility1

## 2017-03-14 NOTE — Telephone Encounter (Signed)
Appointment 10/17  Pt aware

## 2017-03-18 ENCOUNTER — Encounter (INDEPENDENT_AMBULATORY_CARE_PROVIDER_SITE_OTHER): Payer: Self-pay

## 2017-03-18 ENCOUNTER — Ambulatory Visit (INDEPENDENT_AMBULATORY_CARE_PROVIDER_SITE_OTHER): Payer: BLUE CROSS/BLUE SHIELD | Admitting: Physician Assistant

## 2017-03-20 ENCOUNTER — Ambulatory Visit (INDEPENDENT_AMBULATORY_CARE_PROVIDER_SITE_OTHER): Payer: BLUE CROSS/BLUE SHIELD | Admitting: Physician Assistant

## 2017-03-20 VITALS — BP 104/71 | HR 89 | Temp 98.2°F | Ht 63.0 in | Wt 233.0 lb

## 2017-03-20 DIAGNOSIS — Z6841 Body Mass Index (BMI) 40.0 and over, adult: Secondary | ICD-10-CM | POA: Diagnosis not present

## 2017-03-20 DIAGNOSIS — IMO0001 Reserved for inherently not codable concepts without codable children: Secondary | ICD-10-CM

## 2017-03-20 DIAGNOSIS — R7303 Prediabetes: Secondary | ICD-10-CM

## 2017-03-20 DIAGNOSIS — E559 Vitamin D deficiency, unspecified: Secondary | ICD-10-CM

## 2017-03-20 DIAGNOSIS — E669 Obesity, unspecified: Secondary | ICD-10-CM | POA: Diagnosis not present

## 2017-03-20 MED ORDER — METFORMIN HCL 500 MG PO TABS: 500.0000 mg | ORAL_TABLET | Freq: Two times a day (BID) | ORAL | 0 refills | Status: DC

## 2017-03-20 NOTE — Progress Notes (Signed)
Office: [phone]  /  Fax: [phone]   HPI:   Chief Complaint: OBESITY Stacey Nichols is here to discuss her progress with her obesity treatment plan. She is on the Category 3 plan and is following her eating plan approximately 60 to 70 % of the time. She states she is exercising 0 minutes 0 times per week. Stacey Nichols had an upper respiratory infection and could not eat all her food. She was also skipping meals. Stacey Nichols will be traveling to [place] for 7 weeks and wants travel eating strategies. Her weight is 233 lb (105.7 kg) today and has had a weight loss of 9 pounds over a period of 2 weeks since her last visit. She has lost 13 lbs since starting treatment with [place].  Vitamin D deficiency Stacey Nichols has a diagnosis of vitamin D deficiency. She is currently taking vit D and denies nausea, vomiting or muscle weakness.  Pre-Diabetes Stacey Nichols has a diagnosis of pre-diabetes based on her elevated Hgb A1c and was informed this puts her at greater risk of developing diabetes. She is taking metformin currently and continues to work on diet and exercise to decrease risk of diabetes. She denies nausea, polyphagia or hypoglycemia.    ALLERGIES: Allergies  Allergen Reactions   Latex Other (See Comments) and Itching   Sulfa Antibiotics Rash    [Rash] rash - paper tape OK   Tape Rash    [Rash] rash - paper tape OK    MEDICATIONS: Current Outpatient Prescriptions on File Prior to Visit  Medication Sig Dispense Refill   acetaminophen (TYLENOL 8 HOUR) 650 MG CR tablet Take 1 tablet (650 mg total) by mouth every 6 (six) hours as needed for pain. (Patient taking differently: Take 250 mg by mouth every 6 (six) hours as needed for pain. ) 30 tablet 0   calcium carbonate (OS-CAL - DOSED IN MG OF ELEMENTAL CALCIUM) 1250 (500 Ca) MG tablet Take by mouth.     carbamazepine (TEGRETOL XR) 200 MG 12 hr tablet   2   Cyanocobalamin (VITAMIN B-12) 1000 MCG/15ML LIQD Take 500 mcg by mouth once a week.     EPIPEN 2-PAK  0.3 MG/0.3ML SOAJ injection USE AS DIRECTED AS NEEDED  1   fluticasone (FLONASE) 50 MCG/ACT nasal spray Place 1-2 sprays into both nostrils 3 (three) times a week.      ibuprofen (ADVIL,MOTRIN) 200 MG tablet Take 200 mg by mouth every 6 (six) hours as needed.      Iron TABS Take 1 tablet by mouth daily.     levocetirizine (XYZAL) 5 MG tablet Take 1 tablet (5 mg total) by mouth every evening. 90 tablet 0   metFORMIN (GLUCOPHAGE) 500 MG tablet Take 1 tablet (500 mg total) by mouth 2 (two) times daily with a meal. 30 tablet 0   montelukast (SINGULAIR) 10 MG tablet Take 1 tablet (10 mg total) by mouth daily. 90 tablet 1   Multiple Vitamins-Minerals (MULTI COMPLETE PO) Take 1 tablet by mouth daily.     rosuvastatin (CRESTOR) 10 MG tablet Take 1 tablet (10 mg total) by mouth at bedtime. 90 tablet 0   Vitamin D, Ergocalciferol, (DRISDOL) 50000 units CAPS capsule Take 1 capsule (50,000 Units total) by mouth once a week. For 12 weeks 12 capsule 0   No current facility-administered medications on file prior to visit.     PAST MEDICAL HISTORY: Past Medical History:  Diagnosis Date   Anemia    hx of   Anxiety    Arthritis  severe   Cancer ([place])    leukemia   Constipation    Depression    GERD (gastroesophageal reflux disease)    H/O Bell's palsy    H/O hiatal hernia    Headache    occasional   High triglycerides    History of cancer chemotherapy    Leukemia ([facility])    Pneumonia    hx of    Rheumatoid arthritis ([place])    Sleep apnea    Stroke ([place])    "mini stroke-caused bells palsy for 1 month"    PAST SURGICAL HISTORY: Past Surgical History:  Procedure Laterality Date   ABDOMINAL HYSTERECTOMY  2001   LAPAROSCOPIC GASTRIC BAND REMOVAL WITH LAPAROSCOPIC GASTRIC SLEEVE RESECTION     LAPAROSCOPIC GASTRIC BANDING  2000   performed in [place]   TUBAL LIGATION  1996   UPPER GI ENDOSCOPY N/A 04/26/2014   Procedure: UPPER GI ENDOSCOPY;  Surgeon: Pedro Earls, MD;  Location: [facility];  Service: General;  Laterality: N/A;    SOCIAL HISTORY: Social History  Substance Use Topics   Smoking status: Former Smoker    Years: 30.00    Types: Cigarettes    Quit date: 07/15/2005   Smokeless tobacco: Never Used   Alcohol use Yes     Comment: occasional    FAMILY HISTORY: Family History  Problem Relation Age of Onset   Heart disease Father    Hyperlipidemia Father    Obesity Father     ROS: Review of Systems  Constitutional: Positive for weight loss.  Gastrointestinal: Negative for nausea and vomiting.  Musculoskeletal:       Negative muscle weakness  Endo/Heme/Allergies:       Negative polyphagia Negative hypoglycemia    PHYSICAL EXAM: Blood pressure 104/71, pulse 89, temperature 98.2 F (36.8 C), temperature source Oral, height 5\' 3"  (1.6 m), weight 233 lb (105.7 kg), SpO2 97 %. Body mass index is 41.27 kg/m. Physical Exam  Constitutional: She is oriented to person, place, and time. She appears well-developed and well-nourished.  Cardiovascular: Normal rate.   Pulmonary/Chest: Effort normal.  Musculoskeletal: Normal range of motion.  Neurological: She is oriented to person, place, and time.  Skin: Skin is warm and dry.  Psychiatric: She has a normal mood and affect. Her behavior is normal.  Vitals reviewed.   RECENT LABS AND TESTS: BMET    Component Value Date/Time   NA 142 01/21/2017 1128   NA 142 01/14/2013 1757   K 4.6 01/21/2017 1128   K 4.6 01/14/2013 1757   CL 101 01/21/2017 1128   CL 107 01/14/2013 1757   CO2 28 01/21/2017 1128   CO2 29 01/14/2013 1757   GLUCOSE 100 (H) 01/21/2017 1128   GLUCOSE 106 (H) 12/25/2016 1201   GLUCOSE 95 01/14/2013 1757   BUN 21 01/21/2017 1128   BUN 11 01/14/2013 1757   CREATININE 0.83 01/21/2017 1128   CREATININE 0.76 12/25/2016 1201   CALCIUM 9.3 01/21/2017 1128   CALCIUM 9.2 01/14/2013 1757   GFRNONAA 80 01/21/2017 1128   GFRNONAA 89 12/25/2016 1201   GFRAA 92  01/21/2017 1128   GFRAA >89 12/25/2016 1201   Lab Results  Component Value Date   HGBA1C 6.0 (H) 01/21/2017   HGBA1C 6.0 05/27/2016   HGBA1C 6.2 (H) 03/01/2014   Lab Results  Component Value Date   INSULIN 42.1 (H) 01/21/2017   CBC    Component Value Date/Time   WBC 7.9 01/21/2017 1128   WBC 8.3 08/27/2016 0904  RBC 4.48 01/21/2017 1128   RBC 4.48 08/27/2016 0904   HGB 14.7 01/21/2017 1128   HCT 45.2 01/21/2017 1128   PLT 196 08/27/2016 0904   PLT 214 06/20/2015 1141   MCV 101 (H) 01/21/2017 1128   MCV 96 01/15/2013 0522   MCH 32.8 01/21/2017 1128   MCH 32.6 08/27/2016 0904   MCHC 32.5 01/21/2017 1128   MCHC 33.2 08/27/2016 0904   RDW 14.1 01/21/2017 1128   RDW 14.3 01/15/2013 0522   LYMPHSABS 2.7 01/21/2017 1128   LYMPHSABS 3.6 01/15/2013 0522   MONOABS 664 08/27/2016 0904   MONOABS 0.8 01/15/2013 0522   EOSABS 0.2 01/21/2017 1128   EOSABS 0.3 01/15/2013 0522   BASOSABS 0.0 01/21/2017 1128   BASOSABS 0.0 01/15/2013 0522   Iron/TIBC/Ferritin/ %Sat No results found for: IRON, TIBC, FERRITIN, IRONPCTSAT Lipid Panel     Component Value Date/Time   CHOL 153 01/21/2017 1128   CHOL 190 01/15/2013 0522   TRIG 178 (H) 01/21/2017 1128   TRIG 307 (H) 01/15/2013 0522   HDL 54 01/21/2017 1128   HDL 44 01/15/2013 0522   CHOLHDL 2.1 08/27/2016 0904   VLDL 21 08/27/2016 0904   VLDL 61 (H) 01/15/2013 0522   LDLCALC 63 01/21/2017 1128   LDLCALC 85 01/15/2013 0522   Hepatic Function Panel     Component Value Date/Time   PROT 6.6 01/21/2017 1128   PROT 7.7 01/14/2013 1757   ALBUMIN 4.3 01/21/2017 1128   ALBUMIN 3.8 01/14/2013 1757   AST 21 01/21/2017 1128   AST 25 01/14/2013 1757   ALT 20 01/21/2017 1128   ALT 39 01/14/2013 1757   ALKPHOS 82 01/21/2017 1128   ALKPHOS 100 01/14/2013 1757   BILITOT 0.3 01/21/2017 1128   BILITOT 0.3 01/14/2013 1757      Component Value Date/Time   TSH 3.850 01/21/2017 1128   TSH 2.09 12/25/2016 1201   TSH 3.163 03/01/2014 1201     ASSESSMENT AND PLAN: Prediabetes - Plan: metFORMIN (GLUCOPHAGE) 500 MG tablet  Vitamin D deficiency  Class 3 obesity with serious comorbidity and body mass index (BMI) of 40.0 to 44.9 in adult, unspecified obesity type ([place])  PLAN:  Vitamin D Deficiency Stacey Nichols was informed that low vitamin D levels contributes to fatigue and are associated with obesity, breast, and colon cancer. She agrees to continue to take prescription Vit D @50 ,000 IU every week and will follow up for routine testing of vitamin D, at least 2-3 times per year. She was informed of the risk of over-replacement of vitamin D and agrees to not increase her dose unless he discusses this with [place] first.  Pre-Diabetes Stacey Nichols will continue to work on weight loss, exercise, and decreasing simple carbohydrates in her diet to help decrease the risk of diabetes. We dicussed metformin including benefits and risks. She was informed that eating too many simple carbohydrates or too many calories at one sitting increases the likelihood of GI side effects. Stacey Nichols agrees to continue metformin for now and a prescription was written today for metformin 500 mg bid #60 with no refills. Stacey Nichols agreed to follow up with [place] as directed to monitor her progress.  Obesity Stacey Nichols is currently in the action stage of change. As such, her goal is to continue with weight loss efforts She has agreed to follow the Category 3 plan Stacey Nichols has been instructed to work up to a goal of 150 minutes of combined cardio and strengthening exercise per week for weight loss and overall health  benefits. We discussed the following Behavioral Modification Strategies today: increasing lean protein intake, travel eating strategies and planning for success  Stacey Nichols has agreed to follow up with our clinic in 7 weeks. She was informed of the importance of frequent follow up visits to maximize her success with intensive lifestyle modifications for her multiple health conditions.  I,  Doreene Nest, am acting as transcriptionist for Stacey Duverney, PA-C  I have reviewed the above documentation for accuracy and completeness, and I agree with the above. -Stacey Duverney, PA-C  I have reviewed the above note and agree with the plan. -Stacey Nip, MD   OBESITY BEHAVIORAL INTERVENTION VISIT  Today's visit was # 5 out of 22.  Starting weight: 246 lbs Starting date: 01/21/17 Today's weight : 233 lbs Today's date: 03/20/2017 Total lbs lost to date: 13 (Patients must lose 7 lbs in the first 6 months to continue with counseling)   ASK: We discussed the diagnosis of obesity with Stacey Nichols today and Stacey Nichols agreed to give [place] permission to discuss obesity behavioral modification therapy today.  ASSESS: Dayami has the diagnosis of obesity and her BMI today is 41.28 Stacey Nichols is in the action stage of change   ADVISE: Stacey Nichols was educated on the multiple health risks of obesity as well as the benefit of weight loss to improve her health. She was advised of the need for long term treatment and the importance of lifestyle modifications.  AGREE: Multiple dietary modification options and treatment options were discussed and  Stacey Nichols agreed to follow the Category 3 plan We discussed the following Behavioral Modification Strategies today: increasing lean protein intake, travel eating strategies and planning for success

## 2017-03-27 ENCOUNTER — Ambulatory Visit (INDEPENDENT_AMBULATORY_CARE_PROVIDER_SITE_OTHER): Payer: BLUE CROSS/BLUE SHIELD | Admitting: Family Medicine

## 2017-03-27 ENCOUNTER — Encounter: Payer: Self-pay | Admitting: Family Medicine

## 2017-03-27 VITALS — BP 118/71 | HR 95 | Temp 98.3°F | Resp 17 | Ht 63.0 in | Wt 240.5 lb

## 2017-03-27 DIAGNOSIS — E784 Other hyperlipidemia: Secondary | ICD-10-CM | POA: Diagnosis not present

## 2017-03-27 DIAGNOSIS — M150 Primary generalized (osteo)arthritis: Secondary | ICD-10-CM | POA: Diagnosis not present

## 2017-03-27 DIAGNOSIS — Z23 Encounter for immunization: Secondary | ICD-10-CM

## 2017-03-27 DIAGNOSIS — E7849 Other hyperlipidemia: Secondary | ICD-10-CM

## 2017-03-27 DIAGNOSIS — M159 Polyosteoarthritis, unspecified: Secondary | ICD-10-CM

## 2017-03-27 NOTE — Progress Notes (Signed)
Name: Stacey Nichols   MRN: [id number]    DOB: January 31, 1961   Date:03/27/2017       Progress Note  Subjective  Chief Complaint  Chief Complaint  Patient presents with  . Follow-up    3 mo  . Hyperlipidemia    HPI  Patient presents for routine follow-up. She has osteoarthritis in both knees, reports pain and swelling, difficulty with weightbearing. She has seen orthopedics and reportedly received injections in her knees. Currently, she is taking Tylenol 500 mg half tablet 3 times daily with Advil 200 mg 4 times daily, this seems to help relieve her pain, encouraged to continue to follow with orthopedics.  Patient also reports fatigue and generalized musculoskeletal aches and pains, she has arthritis in her knees for which she is being followed by orthopedics. She denies any depressed mood.  Past Medical History:  Diagnosis Date  . Anemia    hx of  . Anxiety   . Arthritis    severe  . Cancer ([place])    leukemia  . Constipation   . Depression   . GERD (gastroesophageal reflux disease)   . H/O Bell's palsy   . H/O hiatal hernia   . Headache    occasional  . High triglycerides   . History of cancer chemotherapy   . Leukemia ([place])   . Pneumonia    hx of   . Rheumatoid arthritis ([place])   . Sleep apnea   . Stroke [facility])    "mini stroke-caused bells palsy for 1 month"    Past Surgical History:  Procedure Laterality Date  . ABDOMINAL HYSTERECTOMY  2001  . LAPAROSCOPIC GASTRIC BAND REMOVAL WITH LAPAROSCOPIC GASTRIC SLEEVE RESECTION    . LAPAROSCOPIC GASTRIC BANDING  2000   performed in [place]  . TUBAL LIGATION  1996  . UPPER GI ENDOSCOPY N/A 04/26/2014   Procedure: UPPER GI ENDOSCOPY;  Surgeon: Pedro Earls, MD;  Location: [facility];  Service: General;  Laterality: N/A;    Family History  Problem Relation Age of Onset  . Heart disease Father   . Hyperlipidemia Father   . Obesity Father     Social History   Social History  . Marital status: Divorced    Spouse name:  N/A  . Number of children: N/A  . Years of education: N/A   Occupational History  . stay at home    Social History Main Topics  . Smoking status: Former Smoker    Years: 30.00    Types: Cigarettes    Quit date: 07/15/2005  . Smokeless tobacco: Never Used  . Alcohol use Yes     Comment: occasional  . Drug use: No  . Sexual activity: Not on file   Other Topics Concern  . Not on file   Social History Narrative  . No narrative on file     Current Outpatient Prescriptions:  .  acetaminophen (TYLENOL 8 HOUR) 650 MG CR tablet, Take 1 tablet (650 mg total) by mouth every 6 (six) hours as needed for pain. (Patient taking differently: Take 250 mg by mouth every 6 (six) hours as needed for pain. ), Disp: 30 tablet, Rfl: 0 .  calcium carbonate (OS-CAL - DOSED IN MG OF ELEMENTAL CALCIUM) 1250 (500 Ca) MG tablet, Take by mouth., Disp: , Rfl:  .  carbamazepine (TEGRETOL XR) 200 MG 12 hr tablet, , Disp: , Rfl: 2 .  Cyanocobalamin (VITAMIN B-12) 1000 MCG/15ML LIQD, Take 500 mcg by mouth once a week., Disp: ,  Rfl:  .  EPIPEN 2-PAK 0.3 MG/0.3ML SOAJ injection, USE AS DIRECTED AS NEEDED, Disp: , Rfl: 1 .  fluticasone (FLONASE) 50 MCG/ACT nasal spray, Place 1-2 sprays into both nostrils 3 (three) times a week. , Disp: , Rfl:  .  ibuprofen (ADVIL,MOTRIN) 200 MG tablet, Take 200 mg by mouth every 6 (six) hours as needed. , Disp: , Rfl:  .  Iron TABS, Take 1 tablet by mouth daily., Disp: , Rfl:  .  metFORMIN (GLUCOPHAGE) 500 MG tablet, Take 1 tablet (500 mg total) by mouth 2 (two) times daily with a meal., Disp: 60 tablet, Rfl: 0 .  montelukast (SINGULAIR) 10 MG tablet, Take 1 tablet (10 mg total) by mouth daily., Disp: 90 tablet, Rfl: 1 .  Multiple Vitamins-Minerals (MULTI COMPLETE PO), Take 1 tablet by mouth daily., Disp: , Rfl:  .  rosuvastatin (CRESTOR) 10 MG tablet, Take 1 tablet (10 mg total) by mouth at bedtime., Disp: 90 tablet, Rfl: 0 .  Vitamin D, Ergocalciferol, (DRISDOL) 50000 units CAPS  capsule, Take 1 capsule (50,000 Units total) by mouth once a week. For 12 weeks, Disp: 12 capsule, Rfl: 0  Allergies  Allergen Reactions  . Latex Other (See Comments) and Itching  . Sulfa Antibiotics Rash    [Rash] rash - paper tape OK  . Tape Rash    [Rash] rash - paper tape OK     Review of Systems   Please see history of present illness for complete discussion of ROS  Objective  Vitals:   03/27/17 1001  BP: 118/71  Pulse: 95  Resp: 17  Temp: 98.3 F (36.8 C)  TempSrc: Oral  SpO2: 94%  Weight: 240 lb 8 oz (109.1 kg)  Height: 5\' 3"  (1.6 m)    Physical Exam  Constitutional: She is oriented to person, place, and time and well-developed, well-nourished, and in no distress.  HENT:  Head: Normocephalic and atraumatic.  Cardiovascular: Normal rate, regular rhythm and normal heart sounds.   No murmur heard. Pulmonary/Chest: Effort normal and breath sounds normal. She has no wheezes.  Neurological: She is alert and oriented to person, place, and time.  Psychiatric: Mood, memory, affect and judgment normal.  Nursing note and vitals reviewed.     Recent Results (from the past 2160 hour(s))  Vitamin B12     Status: None   Collection Time: 01/21/17 11:28 AM  Result Value Ref Range   Vitamin B-12 1,078 232 - 1,245 pg/mL  CBC With Differential     Status: Abnormal   Collection Time: 01/21/17 11:28 AM  Result Value Ref Range   WBC 7.9 3.4 - 10.8 x10E3/uL   RBC 4.48 3.77 - 5.28 x10E6/uL   Hemoglobin 14.7 11.1 - 15.9 g/dL   Hematocrit 45.2 34.0 - 46.6 %   MCV 101 (H) 79 - 97 fL   MCH 32.8 26.6 - 33.0 pg   MCHC 32.5 31.5 - 35.7 g/dL   RDW 14.1 12.3 - 15.4 %   Neutrophils 54 Not Estab. %   Lymphs 34 Not Estab. %   Monocytes 9 Not Estab. %   Eos 3 Not Estab. %   Basos 0 Not Estab. %   Neutrophils Absolute 4.3 1.4 - 7.0 x10E3/uL   Lymphocytes Absolute 2.7 0.7 - 3.1 x10E3/uL   Monocytes Absolute 0.7 0.1 - 0.9 x10E3/uL   EOS (ABSOLUTE) 0.2 0.0 - 0.4 x10E3/uL    Basophils Absolute 0.0 0.0 - 0.2 x10E3/uL   Immature Granulocytes 0 Not Estab. %  Immature Grans (Abs) 0.0 0.0 - 0.1 x10E3/uL  Comprehensive metabolic panel     Status: Abnormal   Collection Time: 01/21/17 11:28 AM  Result Value Ref Range   Glucose 100 (H) 65 - 99 mg/dL   BUN 21 6 - 24 mg/dL   Creatinine, Ser 0.83 0.57 - 1.00 mg/dL   GFR calc non Af Amer 80 >59 mL/min/1.73   GFR calc Af Amer 92 >59 mL/min/1.73   BUN/Creatinine Ratio 25 (H) 9 - 23   Sodium 142 134 - 144 mmol/L   Potassium 4.6 3.5 - 5.2 mmol/L   Chloride 101 96 - 106 mmol/L   CO2 28 20 - 29 mmol/L   Calcium 9.3 8.7 - 10.2 mg/dL   Total Protein 6.6 6.0 - 8.5 g/dL   Albumin 4.3 3.5 - 5.5 g/dL   Globulin, Total 2.3 1.5 - 4.5 g/dL   Albumin/Globulin Ratio 1.9 1.2 - 2.2   Bilirubin Total 0.3 0.0 - 1.2 mg/dL   Alkaline Phosphatase 82 39 - 117 IU/L   AST 21 0 - 40 IU/L   ALT 20 0 - 32 IU/L  Folate     Status: None   Collection Time: 01/21/17 11:28 AM  Result Value Ref Range   Folate 17.6 >3.0 ng/mL    Comment: A serum folate concentration of less than 3.1 ng/mL is considered to represent clinical deficiency.   Hemoglobin A1c     Status: Abnormal   Collection Time: 01/21/17 11:28 AM  Result Value Ref Range   Hgb A1c MFr Bld 6.0 (H) 4.8 - 5.6 %    Comment:          Pre-diabetes: 5.7 - 6.4          Diabetes: >6.4          Glycemic control for adults with diabetes: <7.0    Est. average glucose Bld gHb Est-mCnc 126 mg/dL  Insulin, random     Status: Abnormal   Collection Time: 01/21/17 11:28 AM  Result Value Ref Range   INSULIN 42.1 (H) 2.6 - 24.9 uIU/mL  Lipid Panel With LDL/HDL Ratio     Status: Abnormal   Collection Time: 01/21/17 11:28 AM  Result Value Ref Range   Cholesterol, Total 153 100 - 199 mg/dL   Triglycerides 178 (H) 0 - 149 mg/dL   HDL 54 >39 mg/dL   VLDL Cholesterol Cal 36 5 - 40 mg/dL   LDL Calculated 63 0 - 99 mg/dL   LDl/HDL Ratio 1.2 0.0 - 3.2 ratio    Comment:                                      LDL/HDL Ratio                                             Men  Women                               1/2 Avg.Risk  1.0    1.5                                   Avg.Risk  3.6  3.2                                2X Avg.Risk  6.2    5.0                                3X Avg.Risk  8.0    6.1   T3     Status: None   Collection Time: 01/21/17 11:28 AM  Result Value Ref Range   T3, Total 137 71 - 180 ng/dL  T4, free     Status: None   Collection Time: 01/21/17 11:28 AM  Result Value Ref Range   Free T4 1.11 0.82 - 1.77 ng/dL  TSH     Status: None   Collection Time: 01/21/17 11:28 AM  Result Value Ref Range   TSH 3.850 0.450 - 4.500 uIU/mL  VITAMIN D 25 Hydroxy (Vit-D Deficiency, Fractures)     Status: Abnormal   Collection Time: 01/21/17 11:28 AM  Result Value Ref Range   Vit D, 25-Hydroxy 24.4 (L) 30.0 - 100.0 ng/mL    Comment: Vitamin D deficiency has been defined by the [place] and an Endocrine Society practice guideline as a level of serum 25-OH vitamin D less than 20 ng/mL (1,2). The Endocrine Society went on to further define vitamin D insufficiency as a level between 21 and 29 ng/mL (2). 1. IOM (Institute of Medicine). 2010. Dietary reference    intakes for calcium and D. [place]: The    [employer]. 2. Holick MF, Binkley Franklinton, Bischoff-Ferrari HA, et al.    Evaluation, treatment, and prevention of vitamin D    deficiency: an Endocrine Society clinical practice    guideline. JCEM. 2011 Jul; 96(7):1911-30.      Assessment & Plan  1. Primary osteoarthritis involving multiple joints Continues to follow with orthopedics, advised to stay on acetaminophen and Advil as prescribed.  2. Other hyperlipidemia FLP obtained by weight loss specialist, reviewed and reassured  3. Morbid obesity-BMI 46 Followed by weight loss specialist  4. Need for influenza vaccination  - Flu Vaccine QUAD 6+ mos PF IM (Fluarix Quad PF)   Ian Castagna Asad A.  [facility] 03/27/2017 10:15 AM

## 2017-03-29 ENCOUNTER — Other Ambulatory Visit: Payer: Self-pay | Admitting: Family Medicine

## 2017-04-30 ENCOUNTER — Encounter: Payer: Self-pay | Admitting: Family Medicine

## 2017-04-30 ENCOUNTER — Ambulatory Visit (INDEPENDENT_AMBULATORY_CARE_PROVIDER_SITE_OTHER): Payer: BLUE CROSS/BLUE SHIELD | Admitting: Family Medicine

## 2017-04-30 VITALS — BP 122/74 | HR 88 | Temp 97.9°F | Ht 63.0 in | Wt 236.0 lb

## 2017-04-30 DIAGNOSIS — Z6841 Body Mass Index (BMI) 40.0 and over, adult: Secondary | ICD-10-CM | POA: Diagnosis not present

## 2017-04-30 DIAGNOSIS — Z87898 Personal history of other specified conditions: Secondary | ICD-10-CM

## 2017-04-30 DIAGNOSIS — G500 Trigeminal neuralgia: Secondary | ICD-10-CM | POA: Diagnosis not present

## 2017-04-30 DIAGNOSIS — M170 Bilateral primary osteoarthritis of knee: Secondary | ICD-10-CM | POA: Diagnosis not present

## 2017-04-30 DIAGNOSIS — Z8742 Personal history of other diseases of the female genital tract: Secondary | ICD-10-CM

## 2017-04-30 DIAGNOSIS — G4733 Obstructive sleep apnea (adult) (pediatric): Secondary | ICD-10-CM

## 2017-04-30 DIAGNOSIS — R7303 Prediabetes: Secondary | ICD-10-CM

## 2017-04-30 DIAGNOSIS — C9201 Acute myeloblastic leukemia, in remission: Secondary | ICD-10-CM

## 2017-04-30 DIAGNOSIS — E785 Hyperlipidemia, unspecified: Secondary | ICD-10-CM | POA: Diagnosis not present

## 2017-04-30 NOTE — Patient Instructions (Addendum)
La remitire a ortopeda local en [place] para segunda opinion.  Gusto verla hoy  Regresar como necesite o haga cita para proximo examen fisico en 12/2017.

## 2017-04-30 NOTE — Progress Notes (Signed)
BP 122/74 (BP Location: Left Arm, Patient Position: Sitting, Cuff Size: Large)   Pulse 88   Temp 97.9 F (36.6 C) (Oral)   Ht 5\' 3"  (1.6 m)   Wt 236 lb (107 kg)   SpO2 96%   BMI 41.81 kg/m    CC: new pt to establish care Subjective:    Patient ID: Stacey Nichols, female    DOB: 1960/09/13, 56 y.o.   MRN: [id number]  HPI: Stacey Nichols is a 56 y.o. female presenting on 04/30/2017 for New Patient (Initial Visit) (Wants to establish care with PCP who speaks Spanish)   Prior [facility] Manuella Ghazi. Transfer of care to [place] speaking provider.   H/o leukemia (AML) in remission since 2008. Sees Dr Phill Myron onc yearly in [place] 100 lb weight gain after chemo.   H/o CVA "mini-stroke" dx by Dr Manuella Ghazi neurology 2015, as well as bell's palsy at same time.  HLD - on crestor 10mg  daily.  Seeing [facility] weight management center for provider directed weight loss program. On metformin. Latest note reviewed. H/o bariatric surgery gastric band 2000 in [place] - transitioned to gastric sleeve resection 2015. 1200cal high protein diet   Ongoing bilateral knee osteoarthritis - treats with tylenol 500mg  and advil 400mg  Q6 hours. Prior on meloxicam. Ongoing body aches as well. Has been recommended knee surgery - considering this. Followed by [facility]. Has had xrays but no advanced imaging. Has had steroid injections - undergoing hyaluronic acid injections through ortho (first one done 06/2016). Would like [facility] ortho recommendation for second opinion. Glucosamine causes GERD.   H/o abnormal pap s/p hysterectomy. rec Q57yr pap. Last pap was 8yr ago (OBGYN at [facility]).   Last CPE 3 months ago.   From [place] Lives alone  Separated from husband after leukemia  Occ: retired, was Armed forces operational officer  Activity: no regular exercise  Diet: some water, vegetables daily, 1200cal high protein diet   Relevant past medical, surgical, family and social history reviewed and updated as indicated. Interim  medical history since our last visit reviewed. Allergies and medications reviewed and updated. Outpatient Medications Prior to Visit  Medication Sig Dispense Refill  . calcium carbonate (OS-CAL - DOSED IN MG OF ELEMENTAL CALCIUM) 1250 (500 Ca) MG tablet Take by mouth.    . carbamazepine (TEGRETOL XR) 200 MG 12 hr tablet Take 200 mg by mouth daily.   2  . Cyanocobalamin (VITAMIN B-12) 1000 MCG/15ML LIQD Take 500 mcg by mouth once a week.    . EPIPEN 2-PAK 0.3 MG/0.3ML SOAJ injection USE AS DIRECTED AS NEEDED  1  . fluticasone (FLONASE) 50 MCG/ACT nasal spray Place 1-2 sprays into both nostrils 3 (three) times a week.     Marland Kitchen ibuprofen (ADVIL,MOTRIN) 200 MG tablet Take 400 mg by mouth every 6 (six) hours as needed.     . Iron TABS Take 1 tablet by mouth daily.    . metFORMIN (GLUCOPHAGE) 500 MG tablet Take 1 tablet (500 mg total) by mouth 2 (two) times daily with a meal. 60 tablet 0  . montelukast (SINGULAIR) 10 MG tablet Take 1 tablet (10 mg total) by mouth daily. 90 tablet 1  . Multiple Vitamins-Minerals (MULTI COMPLETE PO) Take 1 tablet by mouth daily.    . rosuvastatin (CRESTOR) 10 MG tablet Take 1 tablet (10 mg total) by mouth at bedtime. 90 tablet 0  . acetaminophen (TYLENOL 8 HOUR) 650 MG CR tablet Take 1 tablet (650 mg total) by mouth every 6 (  six) hours as needed for pain. (Patient taking differently: Take 250 mg by mouth every 6 (six) hours as needed for pain. ) 30 tablet 0  . Vitamin D, Ergocalciferol, (DRISDOL) 50000 units CAPS capsule Take 1 capsule (50,000 Units total) by mouth once a week. For 12 weeks 12 capsule 0   No facility-administered medications prior to visit.      Per HPI unless specifically indicated in ROS section below Review of Systems     Objective:    BP 122/74 (BP Location: Left Arm, Patient Position: Sitting, Cuff Size: Large)   Pulse 88   Temp 97.9 F (36.6 C) (Oral)   Ht 5\' 3"  (1.6 m)   Wt 236 lb (107 kg)   SpO2 96%   BMI 41.81 kg/m   Wt Readings  from Last 3 Encounters:  04/30/17 236 lb (107 kg)  03/27/17 240 lb 8 oz (109.1 kg)  03/20/17 233 lb (105.7 kg)    Physical Exam  Constitutional: She is oriented to person, place, and time. She appears well-developed and well-nourished. No distress.  HENT:  Head: Normocephalic and atraumatic.  Right Ear: Hearing normal.  Left Ear: Hearing normal.  Mouth/Throat: Uvula is midline, oropharynx is clear and moist and mucous membranes are normal. No oropharyngeal exudate, posterior oropharyngeal edema or posterior oropharyngeal erythema.  Eyes: Pupils are equal, round, and reactive to light. Conjunctivae and EOM are normal. No scleral icterus.  Neck: Normal range of motion. Neck supple. No thyromegaly present.  Cardiovascular: Normal rate, regular rhythm, normal heart sounds and intact distal pulses.   No murmur heard. Pulses:      Radial pulses are 2+ on the right side, and 2+ on the left side.  Pulmonary/Chest: Effort normal and breath sounds normal. No respiratory distress. She has no wheezes. She has no rales.  Musculoskeletal: Normal range of motion. She exhibits no edema.  Marked crepitus with flexion/extension of bilateral knees  Lymphadenopathy:    She has no cervical adenopathy.  Neurological: She is alert and oriented to person, place, and time.  CN grossly intact, station and gait intact  Skin: Skin is warm and dry. No rash noted.  Psychiatric: She has a normal mood and affect. Her behavior is normal. Judgment and thought content normal.  Nursing note and vitals reviewed.     Assessment & Plan:  Over 45 minutes were spent face-to-face with the patient during this encounter and >50% of that time was spent on counseling and coordination of care  Problem List Items Addressed This Visit    AML (acute myeloid leukemia) in remission [facility])    She continues to see onc once yearly - Dr Phill Myron in [facility].       Relevant Medications   acetaminophen (TYLENOL) 500 MG tablet     Body mass index (BMI) of 40.0 to 44.9 in adult [facility])    She will continue to follow with [facility] weight management clinic.       Dyslipidemia    Chronic, on crestor 10mg  daily.  She had calcium score of zero on 07/2015. Regardless, she decided to treat aggressively with crestor 10mg  daily.       History of abnormal cervical Pap smear    S/p hysterectomy. Has been recommended Q36yr paps. She regularly sees OBGYN in [facility] for well woman exam.       OSA (obstructive sleep apnea)    Carries this dx, not using CPAP.       Prediabetes  On metformin, managed by [facility] weight management clinic.       Primary osteoarthritis of both knees - Primary    She has been seeing [facility] for endorsed end stage bilateral knee osteoarthritis, has been recommended surgery in the past. She has tried steroid injections and hyaluronic acid injection x1, pending 2nd. She has not been able to tolerate glucosamine. Current pain regimen is scheduled tylenol and advil which is somewhat effective. She is leaning towards proceed with surgical intervention but requests a second opinion in [place] - will refer locally.       Relevant Medications   acetaminophen (TYLENOL) 500 MG tablet   Other Relevant Orders   Ambulatory referral to Orthopedic Surgery   Trigeminal neuralgia    Pt endorses confusing history of bell's palsy vs mini-stroke s/p evaluation by neurology. I reviewed Dr Trena Platt last note from 12/2016 which mentions history of trigeminal neuralgia that is currently treated with carbamazepine 200mg  XR and with good control. Pt states she is taking this only once daily, but his note mentions BID dosing - will need to verify with patient at next visit.           Follow up plan: Return in about 8 months (around 12/29/2017) for annual exam, prior fasting for blood work.  Ria Bush, MD

## 2017-05-01 ENCOUNTER — Encounter: Payer: Self-pay | Admitting: Family Medicine

## 2017-05-01 DIAGNOSIS — Z8742 Personal history of other diseases of the female genital tract: Secondary | ICD-10-CM | POA: Insufficient documentation

## 2017-05-01 DIAGNOSIS — G500 Trigeminal neuralgia: Secondary | ICD-10-CM | POA: Insufficient documentation

## 2017-05-01 NOTE — Assessment & Plan Note (Signed)
On metformin, managed by [facility] weight management clinic.

## 2017-05-01 NOTE — Assessment & Plan Note (Signed)
She continues to see onc once yearly - Dr Phill Myron in [facility].

## 2017-05-01 NOTE — Assessment & Plan Note (Addendum)
Pt endorses confusing history of bell's palsy vs mini-stroke s/p evaluation by neurology. I reviewed Dr Trena Platt last note from 12/2016 which mentions history of trigeminal neuralgia that is currently treated with carbamazepine 200mg  XR and with good control. Pt states she is taking this only once daily, but his note mentions BID dosing - will need to verify with patient at next visit.

## 2017-05-01 NOTE — Assessment & Plan Note (Deleted)
Chronic, on crestor 10mg  daily. Continue.

## 2017-05-01 NOTE — Assessment & Plan Note (Signed)
She will continue to follow with [facility] weight management clinic.

## 2017-05-01 NOTE — Assessment & Plan Note (Signed)
Carries this dx, not using CPAP.

## 2017-05-01 NOTE — Assessment & Plan Note (Signed)
She has been seeing [facility] for endorsed end stage bilateral knee osteoarthritis, has been recommended surgery in the past. She has tried steroid injections and hyaluronic acid injection x1, pending 2nd. She has not been able to tolerate glucosamine. Current pain regimen is scheduled tylenol and advil which is somewhat effective. She is leaning towards proceed with surgical intervention but requests a second opinion in [place] - will refer locally.

## 2017-05-01 NOTE — Assessment & Plan Note (Addendum)
Chronic, on crestor 10mg  daily.  She had calcium score of zero on 07/2015. Regardless, she decided to treat aggressively with crestor 10mg  daily.

## 2017-05-01 NOTE — Assessment & Plan Note (Signed)
S/p hysterectomy. Has been recommended Q21yr paps. She regularly sees OBGYN in [place] for well woman exam.

## 2017-05-05 ENCOUNTER — Ambulatory Visit (INDEPENDENT_AMBULATORY_CARE_PROVIDER_SITE_OTHER): Payer: BLUE CROSS/BLUE SHIELD | Admitting: Physician Assistant

## 2017-05-05 VITALS — BP 108/70 | HR 66 | Temp 98.5°F | Ht 63.0 in | Wt 231.0 lb

## 2017-05-05 DIAGNOSIS — E559 Vitamin D deficiency, unspecified: Secondary | ICD-10-CM | POA: Diagnosis not present

## 2017-05-05 DIAGNOSIS — Z6841 Body Mass Index (BMI) 40.0 and over, adult: Secondary | ICD-10-CM

## 2017-05-05 DIAGNOSIS — D72828 Other elevated white blood cell count: Secondary | ICD-10-CM

## 2017-05-05 DIAGNOSIS — R7303 Prediabetes: Secondary | ICD-10-CM

## 2017-05-05 DIAGNOSIS — E7849 Other hyperlipidemia: Secondary | ICD-10-CM

## 2017-05-05 MED ORDER — VITAMIN D (ERGOCALCIFEROL) 1.25 MG (50000 UNIT) PO CAPS: 50000.0000 [IU] | ORAL_CAPSULE | ORAL | 0 refills | Status: DC

## 2017-05-05 MED ORDER — METFORMIN HCL 500 MG PO TABS: 500.0000 mg | ORAL_TABLET | Freq: Two times a day (BID) | ORAL | 0 refills | Status: DC

## 2017-05-05 NOTE — Progress Notes (Signed)
Office: [phone]  /  Fax: [phone]   HPI:   Chief Complaint: OBESITY Stacey Nichols is here to discuss her progress with her obesity treatment plan. She is on the Category 3 plan and is following her eating plan approximately 50 % of the time. She states she is walking for 120 minutes 7 times per week. Stacey Nichols managed to do well with weight loss despite being on vacation out of the country. She tried to make the smarter food choices, controlled her portions, and walked where she could. She continues to keep up with her protein and is motivated to get back on track and continue her weight loss efforts. Her weight is 231 lb (104.8 kg) today and has had a weight loss of 2 pounds over a period of 6 weeks since her last visit. She has lost 15 lbs since starting treatment with [place].  Vitamin D deficiency Stacey Nichols has a diagnosis of vitamin D deficiency. She is currently taking vit D and denies nausea, vomiting or muscle weakness.  Pre-Diabetes Stacey Nichols has a diagnosis of pre-diabetes based on her elevated Hgb A1c and was informed this puts her at greater risk of developing diabetes. She is taking metformin currently and continues to work on diet and exercise to decrease risk of diabetes. She denies nausea, polyphagia or hypoglycemia.  Hyperlipidemia Stacey Nichols has hyperlipidemia and is currently on Crestor. She has been trying to improve her cholesterol levels with intensive lifestyle modification including a low saturated fat diet, exercise and weight loss. She denies any chest pain, claudication or myalgias.  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   ALLERGIES: Allergies  Allergen Reactions  . Latex Other (See Comments) and Itching  . Sulfa Antibiotics Rash    [Rash] rash - paper tape OK  . Tape Rash    [Rash] rash - paper tape OK    MEDICATIONS: Current Outpatient Prescriptions on File Prior to Visit  Medication Sig Dispense Refill  . acetaminophen (TYLENOL)  500 MG tablet Take 500 mg by mouth every 6 (six) hours as needed.    . calcium carbonate (OS-CAL - DOSED IN MG OF ELEMENTAL CALCIUM) 1250 (500 Ca) MG tablet Take by mouth.    . carbamazepine (TEGRETOL XR) 200 MG 12 hr tablet Take 200 mg by mouth daily.   2  . Cyanocobalamin (VITAMIN B-12) 1000 MCG/15ML LIQD Take 500 mcg by mouth once a week.    . EPIPEN 2-PAK 0.3 MG/0.3ML SOAJ injection USE AS DIRECTED AS NEEDED  1  . fluticasone (FLONASE) 50 MCG/ACT nasal spray Place 1-2 sprays into both nostrils 3 (three) times a week.     Marland Kitchen ibuprofen (ADVIL,MOTRIN) 200 MG tablet Take 400 mg by mouth every 6 (six) hours as needed.     . Iron TABS Take 1 tablet by mouth daily.    . metFORMIN (GLUCOPHAGE) 500 MG tablet Take 1 tablet (500 mg total) by mouth 2 (two) times daily with a meal. 60 tablet 0  . montelukast (SINGULAIR) 10 MG tablet Take 1 tablet (10 mg total) by mouth daily. 90 tablet 1  . Multiple Vitamins-Minerals (MULTI COMPLETE PO) Take 1 tablet by mouth daily.    . rosuvastatin (CRESTOR) 10 MG tablet Take 1 tablet (10 mg total) by mouth at bedtime. 90 tablet 0   No current facility-administered medications on file prior to visit.     PAST MEDICAL HISTORY: Past Medical History:  Diagnosis Date  . 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 Anemia    hx of  . Anxiety   . Arthritis    severe  . Constipation   . Depression   . GERD (gastroesophageal reflux disease)   . H/O Bell's palsy   . H/O hiatal hernia   . Headache    occasional  . High triglycerides   . History of cancer chemotherapy   . Leukemia ([place])   . Pneumonia    hx of   . Rheumatoid arthritis ([place])   . Sleep apnea   . Stroke ([place])    "mini stroke-caused bells palsy for 1 month"    PAST SURGICAL HISTORY: Past Surgical History:  Procedure Laterality Date  . ABDOMINAL HYSTERECTOMY  2001   heavy bleeding, precancerous cells, 1 ovary remains  . LAPAROSCOPIC GASTRIC BAND REMOVAL WITH  LAPAROSCOPIC GASTRIC SLEEVE RESECTION  04/26/2014  . LAPAROSCOPIC GASTRIC BANDING  2000   performed in [place]  . TUBAL LIGATION  1996  . UPPER GI ENDOSCOPY N/A 04/26/2014   Procedure: UPPER GI ENDOSCOPY;  Surgeon: Pedro Earls, MD;  Location: [facility];  Service: General;  Laterality: N/A;    SOCIAL HISTORY: Social History  Substance Use Topics  . Smoking status: Current Some Day Smoker    Years: 30.00    Types: Cigarettes  . Smokeless tobacco: Never Used     Comment: Once every week or 2  . Alcohol use Yes     Comment: occasional    FAMILY HISTORY: Family History  Problem Relation Age of Onset  . Cancer Mother        cervix  . Hyperlipidemia Father   . Obesity Father   . CAD Father        MI and arrhythmia  . Cancer Sister        metastatic, ?pancrease primary  . Diabetes Maternal Grandfather     ROS: Review of Systems  Constitutional: Positive for weight loss.  Cardiovascular: Negative for chest pain and claudication.  Gastrointestinal: Negative for nausea and vomiting.  Musculoskeletal: Negative for myalgias.       Negative muscle weakness  Endo/Heme/Allergies:       Negative polyphagia Negative hypoglycemia    PHYSICAL EXAM: Blood pressure 108/70, pulse 66, temperature 98.5 F (36.9 C), temperature source Oral, height 5\' 3"  (1.6 m), weight 231 lb (104.8 kg), SpO2 96 %. Body mass index is 40.92 kg/m. Physical Exam  Constitutional: She is oriented to person, place, and time. She appears well-developed and well-nourished.  Cardiovascular: Normal rate.   Pulmonary/Chest: Effort normal.  Musculoskeletal: Normal range of motion.  Neurological: She is oriented to person, place, and time.  Skin: Skin is warm and dry.  Psychiatric: She has a normal mood and affect. Her behavior is normal.  Vitals reviewed.   RECENT LABS AND TESTS: BMET    Component Value Date/Time   NA 142 01/21/2017 1128   NA 142 01/14/2013 1757   K 4.6 01/21/2017 1128   K 4.6  01/14/2013 1757   CL 101 01/21/2017 1128   CL 107 01/14/2013 1757   CO2 28 01/21/2017 1128   CO2 29 01/14/2013 1757   GLUCOSE 100 (H) 01/21/2017 1128   GLUCOSE 106 (H) 12/25/2016 1201   GLUCOSE 95 01/14/2013 1757   BUN 21 01/21/2017 1128   BUN 11 01/14/2013 1757   CREATININE 0.83 01/21/2017 1128   CREATININE 0.76 12/25/2016 1201   CALCIUM 9.3 01/21/2017 1128   CALCIUM 9.2 01/14/2013 1757   GFRNONAA 80  01/21/2017 1128   GFRNONAA 89 12/25/2016 1201   GFRAA 92 01/21/2017 1128   GFRAA >89 12/25/2016 1201   Lab Results  Component Value Date   HGBA1C 6.0 (H) 01/21/2017   HGBA1C 6.0 05/27/2016   HGBA1C 6.2 (H) 03/01/2014   Lab Results  Component Value Date   INSULIN 42.1 (H) 01/21/2017   CBC    Component Value Date/Time   WBC 7.9 01/21/2017 1128   WBC 8.3 08/27/2016 0904   RBC 4.48 01/21/2017 1128   RBC 4.48 08/27/2016 0904   HGB 14.7 01/21/2017 1128   HCT 45.2 01/21/2017 1128   PLT 196 08/27/2016 0904   PLT 214 06/20/2015 1141   MCV 101 (H) 01/21/2017 1128   MCV 96 01/15/2013 0522   MCH 32.8 01/21/2017 1128   MCH 32.6 08/27/2016 0904   MCHC 32.5 01/21/2017 1128   MCHC 33.2 08/27/2016 0904   RDW 14.1 01/21/2017 1128   RDW 14.3 01/15/2013 0522   LYMPHSABS 2.7 01/21/2017 1128   LYMPHSABS 3.6 01/15/2013 0522   MONOABS 664 08/27/2016 0904   MONOABS 0.8 01/15/2013 0522   EOSABS 0.2 01/21/2017 1128   EOSABS 0.3 01/15/2013 0522   BASOSABS 0.0 01/21/2017 1128   BASOSABS 0.0 01/15/2013 0522   Iron/TIBC/Ferritin/ %Sat No results found for: IRON, TIBC, FERRITIN, IRONPCTSAT Lipid Panel     Component Value Date/Time   CHOL 153 01/21/2017 1128   CHOL 190 01/15/2013 0522   TRIG 178 (H) 01/21/2017 1128   TRIG 307 (H) 01/15/2013 0522   HDL 54 01/21/2017 1128   HDL 44 01/15/2013 0522   CHOLHDL 2.1 08/27/2016 0904   VLDL 21 08/27/2016 0904   VLDL 61 (H) 01/15/2013 0522   LDLCALC 63 01/21/2017 1128   LDLCALC 85 01/15/2013 0522   Hepatic Function Panel     Component  Value Date/Time   PROT 6.6 01/21/2017 1128   PROT 7.7 01/14/2013 1757   ALBUMIN 4.3 01/21/2017 1128   ALBUMIN 3.8 01/14/2013 1757   AST 21 01/21/2017 1128   AST 25 01/14/2013 1757   ALT 20 01/21/2017 1128   ALT 39 01/14/2013 1757   ALKPHOS 82 01/21/2017 1128   ALKPHOS 100 01/14/2013 1757   BILITOT 0.3 01/21/2017 1128   BILITOT 0.3 01/14/2013 1757      Component Value Date/Time   TSH 3.850 01/21/2017 1128   TSH 2.09 12/25/2016 1201   TSH 3.163 03/01/2014 1201    ASSESSMENT AND PLAN: Prediabetes - Plan: Comprehensive metabolic panel, Hemoglobin A1c, Insulin, random, metFORMIN (GLUCOPHAGE) 500 MG tablet  Vitamin D deficiency - Plan: VITAMIN D 25 Hydroxy (Vit-D Deficiency, Fractures), Vitamin D, Ergocalciferol, (DRISDOL) 50000 units CAPS capsule  Other hyperlipidemia - Plan: Lipid Panel With LDL/HDL Ratio  Other elevated white blood cell (WBC) count - Plan: CBC With Differential  Class 3 severe obesity with serious comorbidity and body mass index (BMI) of 40.0 to 44.9 in adult, unspecified obesity type (HCC)  PLAN:  Vitamin D Deficiency Stacey Nichols was informed that low vitamin D levels contributes to fatigue and are associated with obesity, breast, and colon cancer. She agrees to continue to take prescription Vit D @50 ,000 IU every week #4 with no refills and will follow up for routine testing of vitamin D, at least 2-3 times per year. She was informed of the risk of over-replacement of vitamin D and agrees to not increase her dose unless he discusses this with [place] first. Stacey Nichols agrees to follow up with our clinic in 2 weeks.  Pre-Diabetes Stacey Nichols  will continue to work on weight loss, exercise, and decreasing simple carbohydrates in her diet to help decrease the risk of diabetes. We dicussed metformin including benefits and risks. She was informed that eating too many simple carbohydrates or too many calories at one sitting increases the likelihood of GI side effects. Stacey Nichols agrees to  continue metformin for now and a prescription was written today for metformin 500 mg bid #60 with no refills. Stacey Nichols agreed to follow up with [place] as directed to monitor her progress.  Hyperlipidemia Stacey Nichols was informed of the American Heart Association Guidelines emphasizing intensive lifestyle modifications as the first line treatment for hyperlipidemia. We discussed many lifestyle modifications today in depth, and Stacey Nichols will continue to work on decreasing saturated fats such as fatty red meat, butter and many fried foods. She will also increase vegetables and lean protein in her diet and continue to work on exercise and weight loss efforts. Stacey Nichols will continue her medications as prescribed and will follow up as directed.  Leukocytosis We will check CBC and Stacey Nichols agrees to follow up with our clinic in 2 weeks.  Obesity Stacey Nichols is currently in the action stage of change. As such, her goal is to continue with weight loss efforts She has agreed to follow the Category 3 plan Stacey Nichols has been instructed to work up to a goal of 150 minutes of combined cardio and strengthening exercise per week for weight loss and overall health benefits. We discussed the following Behavioral Modification Strategies today: increasing lean protein intake and work on meal planning and easy cooking plans  Stacey Nichols has agreed to follow up with our clinic in 2 weeks. She was informed of the importance of frequent follow up visits to maximize her success with intensive lifestyle modifications for her multiple health conditions.  I, Stacey Nichols, am acting as transcriptionist for Stacey Duverney, PA-C  I have reviewed the above documentation for accuracy and completeness, and I agree with the above. -Stacey Duverney, PA-C  I have reviewed the above note and agree with the plan. -Dennard Nip, MD   OBESITY BEHAVIORAL INTERVENTION VISIT  Today's visit was # 6 out of 22.  Starting weight: 246 lbs Starting date: 01/21/17 Today's weight  : 231 lbs Today's date: 05/05/2017 Total lbs lost to date: 15 (Patients must lose 7 lbs in the first 6 months to continue with counseling)   ASK: We discussed the diagnosis of obesity with Windell Moment today and Delisa agreed to give [place] permission to discuss obesity behavioral modification therapy today.  ASSESS: Jaquala has the diagnosis of obesity and her BMI today is 40.93 Hadlei is in the action stage of change   ADVISE: Jalyn was educated on the multiple health risks of obesity as well as the benefit of weight loss to improve her health. She was advised of the need for long term treatment and the importance of lifestyle modifications.  AGREE: Multiple dietary modification options and treatment options were discussed and  Branae agreed to follow the Category 3 plan We discussed the following Behavioral Modification Strategies today: increasing lean protein intake and work on meal planning and easy cooking plans

## 2017-05-06 LAB — CBC WITH DIFFERENTIAL
Basophils Absolute: 0 10*3/uL (ref 0.0–0.2)
Basos: 0 %
EOS (ABSOLUTE): 0.2 10*3/uL (ref 0.0–0.4)
EOS: 3 %
HEMATOCRIT: 43.1 % (ref 34.0–46.6)
Hemoglobin: 14.5 g/dL (ref 11.1–15.9)
IMMATURE GRANULOCYTES: 0 %
Immature Grans (Abs): 0 10*3/uL (ref 0.0–0.1)
LYMPHS ABS: 2.1 10*3/uL (ref 0.7–3.1)
Lymphs: 34 %
MCH: 31.9 pg (ref 26.6–33.0)
MCHC: 33.6 g/dL (ref 31.5–35.7)
MCV: 95 fL (ref 79–97)
MONOS ABS: 0.5 10*3/uL (ref 0.1–0.9)
Monocytes: 8 %
NEUTROS PCT: 55 %
Neutrophils Absolute: 3.4 10*3/uL (ref 1.4–7.0)
RBC: 4.55 x10E6/uL (ref 3.77–5.28)
RDW: 14.6 % (ref 12.3–15.4)
WBC: 6.2 10*3/uL (ref 3.4–10.8)

## 2017-05-06 LAB — COMPREHENSIVE METABOLIC PANEL
A/G RATIO: 1.8 (ref 1.2–2.2)
ALK PHOS: 70 IU/L (ref 39–117)
ALT: 18 IU/L (ref 0–32)
AST: 15 IU/L (ref 0–40)
Albumin: 4.4 g/dL (ref 3.5–5.5)
BUN/Creatinine Ratio: 26 — ABNORMAL HIGH (ref 9–23)
BUN: 20 mg/dL (ref 6–24)
Bilirubin Total: 0.3 mg/dL (ref 0.0–1.2)
CALCIUM: 9.5 mg/dL (ref 8.7–10.2)
CO2: 24 mmol/L (ref 20–29)
CREATININE: 0.77 mg/dL (ref 0.57–1.00)
Chloride: 104 mmol/L (ref 96–106)
GFR calc Af Amer: 101 mL/min/{1.73_m2} (ref >59)
GFR, EST NON AFRICAN AMERICAN: 87 mL/min/{1.73_m2} (ref >59)
GLOBULIN, TOTAL: 2.4 g/dL (ref 1.5–4.5)
Glucose: 97 mg/dL (ref 65–99)
POTASSIUM: 4.5 mmol/L (ref 3.5–5.2)
SODIUM: 141 mmol/L (ref 134–144)
Total Protein: 6.8 g/dL (ref 6.0–8.5)

## 2017-05-06 LAB — LIPID PANEL WITH LDL/HDL RATIO
Cholesterol, Total: 136 mg/dL (ref 100–199)
HDL: 52 mg/dL (ref >39)
LDL Calculated: 40 mg/dL (ref 0–99)
LDL/HDL RATIO: 0.8 ratio (ref 0.0–3.2)
Triglycerides: 219 mg/dL — ABNORMAL HIGH (ref 0–149)
VLDL Cholesterol Cal: 44 mg/dL — ABNORMAL HIGH (ref 5–40)

## 2017-05-06 LAB — VITAMIN D 25 HYDROXY (VIT D DEFICIENCY, FRACTURES): VIT D 25 HYDROXY: 34.7 ng/mL (ref 30.0–100.0)

## 2017-05-06 LAB — INSULIN, RANDOM: INSULIN: 26 u[IU]/mL — AB (ref 2.6–24.9)

## 2017-05-06 LAB — HEMOGLOBIN A1C
ESTIMATED AVERAGE GLUCOSE: 123 mg/dL
HEMOGLOBIN A1C: 5.9 % — AB (ref 4.8–5.6)

## 2017-05-07 ENCOUNTER — Other Ambulatory Visit: Payer: Self-pay | Admitting: Family Medicine

## 2017-05-07 DIAGNOSIS — E782 Mixed hyperlipidemia: Secondary | ICD-10-CM

## 2017-05-16 ENCOUNTER — Ambulatory Visit: Payer: BLUE CROSS/BLUE SHIELD | Admitting: Family Medicine

## 2017-05-19 ENCOUNTER — Ambulatory Visit (INDEPENDENT_AMBULATORY_CARE_PROVIDER_SITE_OTHER): Payer: BLUE CROSS/BLUE SHIELD | Admitting: Physician Assistant

## 2017-05-19 VITALS — BP 114/73 | HR 74 | Temp 98.5°F | Ht 63.0 in | Wt 231.0 lb

## 2017-05-19 DIAGNOSIS — E559 Vitamin D deficiency, unspecified: Secondary | ICD-10-CM | POA: Diagnosis not present

## 2017-05-19 DIAGNOSIS — E7849 Other hyperlipidemia: Secondary | ICD-10-CM

## 2017-05-19 DIAGNOSIS — Z6841 Body Mass Index (BMI) 40.0 and over, adult: Secondary | ICD-10-CM | POA: Diagnosis not present

## 2017-05-19 MED ORDER — VITAMIN D (ERGOCALCIFEROL) 1.25 MG (50000 UNIT) PO CAPS: 50000.0000 [IU] | ORAL_CAPSULE | ORAL | 0 refills | Status: DC

## 2017-05-19 MED ORDER — ROSUVASTATIN CALCIUM 10 MG PO TABS: 10.0000 mg | ORAL_TABLET | Freq: Every day | ORAL | 0 refills | Status: DC

## 2017-05-19 NOTE — Progress Notes (Signed)
Office: [phone]  /  Fax: [phone]   HPI:   Chief Complaint: OBESITY Stacey Nichols is here to discuss her progress with her obesity treatment plan. She is on the Category 3 plan and is following her eating plan approximately 85 % of the time. She states she is exercising 0 minutes 0 times per week. Stacey Nichols maintained her weight. Stacey Nichols continues to plan her meals well and states hunger is well controlled. She would like to try a different meal plan.  Her weight is 231 lb (104.8 kg) today and maintained her weight over a period of 2 weeks since her last visit. She has lost 15 lbs since starting treatment with [place].  Vitamin D deficiency Stacey Nichols has a diagnosis of vitamin D deficiency. She is currently taking vit D and denies nausea, vomiting or muscle weakness.  Hyperlipidemia Stacey Nichols has hyperlipidemia and has been trying to improve her cholesterol levels with intensive lifestyle modification including a low saturated fat diet, exercise and weight loss. She denies any chest pain, claudication or myalgias.   ALLERGIES: Allergies  Allergen Reactions  . Latex Other (See Comments) and Itching  . Sulfa Antibiotics Rash    [Rash] rash - paper tape OK  . Tape Rash    [Rash] rash - paper tape OK    MEDICATIONS: Current Outpatient Medications on File Prior to Visit  Medication Sig Dispense Refill  . acetaminophen (TYLENOL) 500 MG tablet Take 500 mg by mouth every 6 (six) hours as needed.    . calcium carbonate (OS-CAL - DOSED IN MG OF ELEMENTAL CALCIUM) 1250 (500 Ca) MG tablet Take by mouth.    . carbamazepine (TEGRETOL XR) 200 MG 12 hr tablet Take 200 mg by mouth daily.   2  . Cyanocobalamin (VITAMIN B-12) 1000 MCG/15ML LIQD Take 500 mcg by mouth once a week.    . EPIPEN 2-PAK 0.3 MG/0.3ML SOAJ injection USE AS DIRECTED AS NEEDED  1  . fluticasone (FLONASE) 50 MCG/ACT nasal spray Place 1-2 sprays into both nostrils 3 (three) times a week.     Marland Kitchen ibuprofen (ADVIL,MOTRIN) 200 MG tablet Take 400  mg by mouth every 6 (six) hours as needed.     . Iron TABS Take 1 tablet by mouth daily.    . metFORMIN (GLUCOPHAGE) 500 MG tablet Take 1 tablet (500 mg total) by mouth 2 (two) times daily with a meal. 60 tablet 0  . montelukast (SINGULAIR) 10 MG tablet Take 1 tablet (10 mg total) by mouth daily. 90 tablet 1  . Multiple Vitamins-Minerals (MULTI COMPLETE PO) Take 1 tablet by mouth daily.     No current facility-administered medications on file prior to visit.     PAST MEDICAL HISTORY: Past Medical History:  Diagnosis Date  . 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 Anemia    hx of  . Anxiety   . Arthritis    severe  . Constipation   . Depression   . GERD (gastroesophageal reflux disease)   . H/O Bell's palsy   . H/O hiatal hernia   . Headache    occasional  . High triglycerides   . History of cancer chemotherapy   . Leukemia ([place])   . Pneumonia    hx of   . Rheumatoid arthritis ([place])   . Sleep apnea   . Stroke [facility])    "mini stroke-caused bells palsy for 1 month"    PAST SURGICAL HISTORY: Past Surgical History:  Procedure Laterality  Date  . ABDOMINAL HYSTERECTOMY  2001   heavy bleeding, precancerous cells, 1 ovary remains  . LAPAROSCOPIC GASTRIC BAND REMOVAL WITH LAPAROSCOPIC GASTRIC SLEEVE RESECTION  04/26/2014  . LAPAROSCOPIC GASTRIC BANDING  2000   performed in [place]  . TUBAL LIGATION  1996    SOCIAL HISTORY: Social History   Tobacco Use  . Smoking status: Current Some Day Smoker    Years: 30.00    Types: Cigarettes  . Smokeless tobacco: Never Used  . Tobacco comment: Once every week or 2  Substance Use Topics  . Alcohol use: Yes    Comment: occasional  . Drug use: No    FAMILY HISTORY: Family History  Problem Relation Age of Onset  . Cancer Mother        cervix  . Hyperlipidemia Father   . Obesity Father   . CAD Father        MI and arrhythmia  . Cancer Sister        metastatic, ?pancrease primary  . Diabetes  Maternal Grandfather     ROS: Review of Systems  Constitutional: Negative for weight loss.  Cardiovascular: Negative for chest pain and claudication.  Gastrointestinal: Negative for nausea and vomiting.  Musculoskeletal: Negative for myalgias.       Negative muscle weakness    PHYSICAL EXAM: Blood pressure 114/73, pulse 74, temperature 98.5 F (36.9 C), temperature source Oral, height 5\' 3"  (1.6 m), weight 231 lb (104.8 kg), SpO2 98 %. Body mass index is 40.92 kg/m. Physical Exam  Constitutional: She is oriented to person, place, and time. She appears well-developed and well-nourished.  Cardiovascular: Normal rate.  Pulmonary/Chest: Effort normal.  Musculoskeletal: Normal range of motion.  Neurological: She is oriented to person, place, and time.  Skin: Skin is warm and dry.  Psychiatric: She has a normal mood and affect. Her behavior is normal.  Vitals reviewed.   RECENT LABS AND TESTS: BMET    Component Value Date/Time   NA 141 05/05/2017 1100   NA 142 01/14/2013 1757   K 4.5 05/05/2017 1100   K 4.6 01/14/2013 1757   CL 104 05/05/2017 1100   CL 107 01/14/2013 1757   CO2 24 05/05/2017 1100   CO2 29 01/14/2013 1757   GLUCOSE 97 05/05/2017 1100   GLUCOSE 106 (H) 12/25/2016 1201   GLUCOSE 95 01/14/2013 1757   BUN 20 05/05/2017 1100   BUN 11 01/14/2013 1757   CREATININE 0.77 05/05/2017 1100   CREATININE 0.76 12/25/2016 1201   CALCIUM 9.5 05/05/2017 1100   CALCIUM 9.2 01/14/2013 1757   GFRNONAA 87 05/05/2017 1100   GFRNONAA 89 12/25/2016 1201   GFRAA 101 05/05/2017 1100   GFRAA >89 12/25/2016 1201   Lab Results  Component Value Date   HGBA1C 5.9 (H) 05/05/2017   HGBA1C 6.0 (H) 01/21/2017   HGBA1C 6.0 05/27/2016   HGBA1C 6.2 (H) 03/01/2014   Lab Results  Component Value Date   INSULIN 26.0 (H) 05/05/2017   INSULIN 42.1 (H) 01/21/2017   CBC    Component Value Date/Time   WBC 6.2 05/05/2017 1100   WBC 8.3 08/27/2016 0904   RBC 4.55 05/05/2017 1100    RBC 4.48 08/27/2016 0904   HGB 14.5 05/05/2017 1100   HCT 43.1 05/05/2017 1100   PLT 196 08/27/2016 0904   PLT 214 06/20/2015 1141   MCV 95 05/05/2017 1100   MCV 96 01/15/2013 0522   MCH 31.9 05/05/2017 1100   MCH 32.6 08/27/2016 0904   MCHC 33.6  05/05/2017 1100   MCHC 33.2 08/27/2016 0904   RDW 14.6 05/05/2017 1100   RDW 14.3 01/15/2013 0522   LYMPHSABS 2.1 05/05/2017 1100   LYMPHSABS 3.6 01/15/2013 0522   MONOABS 664 08/27/2016 0904   MONOABS 0.8 01/15/2013 0522   EOSABS 0.2 05/05/2017 1100   EOSABS 0.3 01/15/2013 0522   BASOSABS 0.0 05/05/2017 1100   BASOSABS 0.0 01/15/2013 0522   Iron/TIBC/Ferritin/ %Sat No results found for: IRON, TIBC, FERRITIN, IRONPCTSAT Lipid Panel     Component Value Date/Time   CHOL 136 05/05/2017 1100   CHOL 190 01/15/2013 0522   TRIG 219 (H) 05/05/2017 1100   TRIG 307 (H) 01/15/2013 0522   HDL 52 05/05/2017 1100   HDL 44 01/15/2013 0522   CHOLHDL 2.1 08/27/2016 0904   VLDL 21 08/27/2016 0904   VLDL 61 (H) 01/15/2013 0522   LDLCALC 40 05/05/2017 1100   LDLCALC 85 01/15/2013 0522   Hepatic Function Panel     Component Value Date/Time   PROT 6.8 05/05/2017 1100   PROT 7.7 01/14/2013 1757   ALBUMIN 4.4 05/05/2017 1100   ALBUMIN 3.8 01/14/2013 1757   AST 15 05/05/2017 1100   AST 25 01/14/2013 1757   ALT 18 05/05/2017 1100   ALT 39 01/14/2013 1757   ALKPHOS 70 05/05/2017 1100   ALKPHOS 100 01/14/2013 1757   BILITOT 0.3 05/05/2017 1100   BILITOT 0.3 01/14/2013 1757      Component Value Date/Time   TSH 3.850 01/21/2017 1128   TSH 2.09 12/25/2016 1201   TSH 3.163 03/01/2014 1201    ASSESSMENT AND PLAN: Vitamin D deficiency - Plan: Vitamin D, Ergocalciferol, (DRISDOL) 50000 units CAPS capsule  Other hyperlipidemia - Plan: rosuvastatin (CRESTOR) 10 MG tablet  Class 3 severe obesity with serious comorbidity and body mass index (BMI) of 40.0 to 44.9 in adult, unspecified obesity type ([place])  PLAN:  Vitamin D Deficiency Stacey Nichols was  informed that low vitamin D levels contributes to fatigue and are associated with obesity, breast, and colon cancer. She agrees to continue to take prescription Vit D @50 ,000 IU every week #4 with no refills and will follow up for routine testing of vitamin D, at least 2-3 times per year. She was informed of the risk of over-replacement of vitamin D and agrees to not increase her dose unless he discusses this with [place] first. Stacey Nichols agrees to follow up with our clinic in 2 weeks.  Hyperlipidemia Stacey Nichols was informed of the American Heart Association Guidelines emphasizing intensive lifestyle modifications as the first line treatment for hyperlipidemia. We discussed many lifestyle modifications today in depth, and Stacey Nichols will continue to work on decreasing saturated fats such as fatty red meat, butter and many fried foods. She will also increase vegetables and lean protein in her diet and continue to work on exercise and weight loss efforts. Stacey Nichols agrees to continue crestor 10 mg #30 with no refills and will follow up at the agreed upon time.   Obesity Stacey Nichols is currently in the action stage of change. As such, her goal is to continue with weight loss efforts She has agreed to follow a lower carbohydrate, vegetable and lean protein rich diet plan Stacey Nichols has been instructed to work up to a goal of 150 minutes of combined cardio and strengthening exercise per week for weight loss and overall health benefits. We discussed the following Behavioral Modification Strategies today: increasing lean protein intake and keeping healthy foods in the home  Stacey Nichols has agreed to follow up with  our clinic in 2 weeks. She was informed of the importance of frequent follow up visits to maximize her success with intensive lifestyle modifications for her multiple health conditions.  I, Doreene Nest, am acting as transcriptionist for Lacy Duverney, PA-C  I have reviewed the above documentation for accuracy and completeness, and I  agree with the above. -Lacy Duverney, PA-C  I have reviewed the above note and agree with the plan. -Dennard Nip, MD   OBESITY BEHAVIORAL INTERVENTION VISIT  Today's visit was # 7 out of 22.  Starting weight: 246 lbs Starting date: 01/21/17 Today's weight : 231 lbs  Today's date: 05/20/2017 Total lbs lost to date: 15 (Patients must lose 7 lbs in the first 6 months to continue with counseling)   ASK: We discussed the diagnosis of obesity with Stacey Nichols today and Stacey Nichols agreed to give [place] permission to discuss obesity behavioral modification therapy today.  ASSESS: Stacey Nichols has the diagnosis of obesity and her BMI today is 40.93 Stacey Nichols is in the action stage of change   ADVISE: Stacey Nichols was educated on the multiple health risks of obesity as well as the benefit of weight loss to improve her health. She was advised of the need for long term treatment and the importance of lifestyle modifications.  AGREE: Multiple dietary modification options and treatment options were discussed and  Stacey Nichols agreed to follow a lower carbohydrate, vegetable and lean protein rich diet plan We discussed the following Behavioral Modification Strategies today: increasing lean protein intake and keeping healthy foods in the home

## 2017-06-03 ENCOUNTER — Ambulatory Visit (INDEPENDENT_AMBULATORY_CARE_PROVIDER_SITE_OTHER): Payer: BLUE CROSS/BLUE SHIELD | Admitting: Physician Assistant

## 2017-06-03 VITALS — BP 107/72 | HR 66 | Temp 98.3°F | Ht 63.0 in | Wt 233.0 lb

## 2017-06-03 DIAGNOSIS — R7303 Prediabetes: Secondary | ICD-10-CM

## 2017-06-03 DIAGNOSIS — E559 Vitamin D deficiency, unspecified: Secondary | ICD-10-CM

## 2017-06-03 DIAGNOSIS — E7849 Other hyperlipidemia: Secondary | ICD-10-CM | POA: Diagnosis not present

## 2017-06-03 DIAGNOSIS — Z6841 Body Mass Index (BMI) 40.0 and over, adult: Secondary | ICD-10-CM | POA: Diagnosis not present

## 2017-06-03 MED ORDER — METFORMIN HCL 500 MG PO TABS: 500.0000 mg | ORAL_TABLET | Freq: Two times a day (BID) | ORAL | 0 refills | Status: DC

## 2017-06-03 MED ORDER — VITAMIN D (ERGOCALCIFEROL) 1.25 MG (50000 UNIT) PO CAPS: 50000.0000 [IU] | ORAL_CAPSULE | ORAL | 0 refills | Status: DC

## 2017-06-03 MED ORDER — ROSUVASTATIN CALCIUM 10 MG PO TABS: 10.0000 mg | ORAL_TABLET | Freq: Every day | ORAL | 0 refills | Status: DC

## 2017-06-03 NOTE — Progress Notes (Signed)
Office: [phone]  /  Fax: [phone]   HPI:   Chief Complaint: OBESITY Stacey Nichols is here to discuss her progress with her obesity treatment plan. She is on the lower carbohydrate, vegetable and lean protein rich diet plan and is following her eating plan approximately 70 to 75 % of the time. She states she is exercising 0 minutes 0 times per week. Stacey Nichols tolerated the low carbohydrate plan for about 1 week, but she traveled and could not follow the plan. She would like more variety with her meals.  Her weight is 233 lb (105.7 kg) today and has had a weight gain of 2 pounds over a period of 2 weeks since her last visit. She has lost 13 lbs since starting treatment with [place].  Hyperlipidemia Stacey Nichols has hyperlipidemia and has been trying to improve her cholesterol levels with intensive lifestyle modification including a low saturated fat diet, exercise and weight loss. She denies any chest pain, claudication or myalgias.  Vitamin D deficiency Stacey Nichols has a diagnosis of vitamin D deficiency. She is currently taking vit D and denies nausea, vomiting or muscle weakness.  Pre-Diabetes Stacey Nichols has a diagnosis of pre-diabetes based on her elevated Hgb A1c and was informed this puts her at greater risk of developing diabetes. She is taking metformin currently and continues to work on diet and exercise to decrease risk of diabetes. She denies nausea, polyphagia or hypoglycemia.  ALLERGIES: Allergies  Allergen Reactions  . Latex Other (See Comments) and Itching  . Sulfa Antibiotics Rash    [Rash] rash - paper tape OK  . Tape Rash    [Rash] rash - paper tape OK    MEDICATIONS: Current Outpatient Medications on File Prior to Visit  Medication Sig Dispense Refill  . acetaminophen (TYLENOL) 500 MG tablet Take 500 mg by mouth every 6 (six) hours as needed.    . calcium carbonate (OS-CAL - DOSED IN MG OF ELEMENTAL CALCIUM) 1250 (500 Ca) MG tablet Take by mouth.    . carbamazepine (TEGRETOL XR) 200 MG  12 hr tablet Take 200 mg by mouth daily.   2  . Cyanocobalamin (VITAMIN B-12) 1000 MCG/15ML LIQD Take 500 mcg by mouth once a week.    . EPIPEN 2-PAK 0.3 MG/0.3ML SOAJ injection USE AS DIRECTED AS NEEDED  1  . fluticasone (FLONASE) 50 MCG/ACT nasal spray Place 1-2 sprays into both nostrils 3 (three) times a week.     Marland Kitchen ibuprofen (ADVIL,MOTRIN) 200 MG tablet Take 400 mg by mouth every 6 (six) hours as needed.     . Iron TABS Take 1 tablet by mouth daily.    . metFORMIN (GLUCOPHAGE) 500 MG tablet Take 1 tablet (500 mg total) by mouth 2 (two) times daily with a meal. 60 tablet 0  . montelukast (SINGULAIR) 10 MG tablet Take 1 tablet (10 mg total) by mouth daily. 90 tablet 1  . Multiple Vitamins-Minerals (MULTI COMPLETE PO) Take 1 tablet by mouth daily.    . rosuvastatin (CRESTOR) 10 MG tablet Take 1 tablet (10 mg total) at bedtime by mouth. 90 tablet 0  . Vitamin D, Ergocalciferol, (DRISDOL) 50000 units CAPS capsule Take 1 capsule (50,000 Units total) every 7 (seven) days by mouth. 4 capsule 0   No current facility-administered medications on file prior to visit.     PAST MEDICAL HISTORY: Past Medical History:  Diagnosis Date  . 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 Anemia    hx of  .  Anxiety   . Arthritis    severe  . Constipation   . Depression   . GERD (gastroesophageal reflux disease)   . H/O Bell's palsy   . H/O hiatal hernia   . Headache    occasional  . High triglycerides   . History of cancer chemotherapy   . Leukemia ([place])   . Pneumonia    hx of   . Rheumatoid arthritis ([place])   . Sleep apnea   . Stroke ([place])    "mini stroke-caused bells palsy for 1 month"    PAST SURGICAL HISTORY: Past Surgical History:  Procedure Laterality Date  . ABDOMINAL HYSTERECTOMY  2001   heavy bleeding, precancerous cells, 1 ovary remains  . LAPAROSCOPIC GASTRIC BAND REMOVAL WITH LAPAROSCOPIC GASTRIC SLEEVE RESECTION  04/26/2014  . LAPAROSCOPIC GASTRIC  BAND REMOVAL WITH LAPAROSCOPIC GASTRIC SLEEVE RESECTION N/A 04/26/2014   Performed by Pedro Earls, MD at [facility] ORS  . LAPAROSCOPIC GASTRIC BANDING  2000   performed in [place]  . TUBAL LIGATION  1996  . UPPER GI ENDOSCOPY N/A 04/26/2014   Performed by Pedro Earls, MD at [facility] ORS    SOCIAL HISTORY: Social History   Tobacco Use  . Smoking status: Current Some Day Smoker    Years: 30.00    Types: Cigarettes  . Smokeless tobacco: Never Used  . Tobacco comment: Once every week or 2  Substance Use Topics  . Alcohol use: Yes    Comment: occasional  . Drug use: No    FAMILY HISTORY: Family History  Problem Relation Age of Onset  . Cancer Mother        cervix  . Hyperlipidemia Father   . Obesity Father   . CAD Father        MI and arrhythmia  . Cancer Sister        metastatic, ?pancrease primary  . Diabetes Maternal Grandfather     ROS: Review of Systems  Constitutional: Negative for weight loss.  Cardiovascular: Negative for chest pain and claudication.  Gastrointestinal: Negative for nausea and vomiting.  Musculoskeletal: Negative for myalgias.       Negative muscle weakness  Endo/Heme/Allergies:       Negative polyphagia Negative hypoglycemia    PHYSICAL EXAM: Blood pressure 107/72, pulse 66, temperature 98.3 F (36.8 C), temperature source Oral, height 5\' 3"  (1.6 m), weight 233 lb (105.7 kg), SpO2 97 %. Body mass index is 41.27 kg/m. Physical Exam  Constitutional: She is oriented to person, place, and time. She appears well-developed and well-nourished.  Cardiovascular: Normal rate.  Pulmonary/Chest: Effort normal.  Musculoskeletal: Normal range of motion.  Neurological: She is oriented to person, place, and time.  Skin: Skin is warm and dry.  Psychiatric: She has a normal mood and affect. Her behavior is normal.  Vitals reviewed.   RECENT LABS AND TESTS: BMET    Component Value Date/Time   NA 141 05/05/2017 1100   NA 142 01/14/2013 1757   K  4.5 05/05/2017 1100   K 4.6 01/14/2013 1757   CL 104 05/05/2017 1100   CL 107 01/14/2013 1757   CO2 24 05/05/2017 1100   CO2 29 01/14/2013 1757   GLUCOSE 97 05/05/2017 1100   GLUCOSE 106 (H) 12/25/2016 1201   GLUCOSE 95 01/14/2013 1757   BUN 20 05/05/2017 1100   BUN 11 01/14/2013 1757   CREATININE 0.77 05/05/2017 1100   CREATININE 0.76 12/25/2016 1201   CALCIUM 9.5 05/05/2017 1100   CALCIUM 9.2 01/14/2013 1757  GFRNONAA 87 05/05/2017 1100   GFRNONAA 89 12/25/2016 1201   GFRAA 101 05/05/2017 1100   GFRAA >89 12/25/2016 1201   Lab Results  Component Value Date   HGBA1C 5.9 (H) 05/05/2017   HGBA1C 6.0 (H) 01/21/2017   HGBA1C 6.0 05/27/2016   HGBA1C 6.2 (H) 03/01/2014   Lab Results  Component Value Date   INSULIN 26.0 (H) 05/05/2017   INSULIN 42.1 (H) 01/21/2017   CBC    Component Value Date/Time   WBC 6.2 05/05/2017 1100   WBC 8.3 08/27/2016 0904   RBC 4.55 05/05/2017 1100   RBC 4.48 08/27/2016 0904   HGB 14.5 05/05/2017 1100   HCT 43.1 05/05/2017 1100   PLT 196 08/27/2016 0904   PLT 214 06/20/2015 1141   MCV 95 05/05/2017 1100   MCV 96 01/15/2013 0522   MCH 31.9 05/05/2017 1100   MCH 32.6 08/27/2016 0904   MCHC 33.6 05/05/2017 1100   MCHC 33.2 08/27/2016 0904   RDW 14.6 05/05/2017 1100   RDW 14.3 01/15/2013 0522   LYMPHSABS 2.1 05/05/2017 1100   LYMPHSABS 3.6 01/15/2013 0522   MONOABS 664 08/27/2016 0904   MONOABS 0.8 01/15/2013 0522   EOSABS 0.2 05/05/2017 1100   EOSABS 0.3 01/15/2013 0522   BASOSABS 0.0 05/05/2017 1100   BASOSABS 0.0 01/15/2013 0522   Iron/TIBC/Ferritin/ %Sat No results found for: IRON, TIBC, FERRITIN, IRONPCTSAT Lipid Panel     Component Value Date/Time   CHOL 136 05/05/2017 1100   CHOL 190 01/15/2013 0522   TRIG 219 (H) 05/05/2017 1100   TRIG 307 (H) 01/15/2013 0522   HDL 52 05/05/2017 1100   HDL 44 01/15/2013 0522   CHOLHDL 2.1 08/27/2016 0904   VLDL 21 08/27/2016 0904   VLDL 61 (H) 01/15/2013 0522   LDLCALC 40 05/05/2017  1100   LDLCALC 85 01/15/2013 0522   Hepatic Function Panel     Component Value Date/Time   PROT 6.8 05/05/2017 1100   PROT 7.7 01/14/2013 1757   ALBUMIN 4.4 05/05/2017 1100   ALBUMIN 3.8 01/14/2013 1757   AST 15 05/05/2017 1100   AST 25 01/14/2013 1757   ALT 18 05/05/2017 1100   ALT 39 01/14/2013 1757   ALKPHOS 70 05/05/2017 1100   ALKPHOS 100 01/14/2013 1757   BILITOT 0.3 05/05/2017 1100   BILITOT 0.3 01/14/2013 1757      Component Value Date/Time   TSH 3.850 01/21/2017 1128   TSH 2.09 12/25/2016 1201   TSH 3.163 03/01/2014 1201    ASSESSMENT AND PLAN: Other hyperlipidemia - Plan: rosuvastatin (CRESTOR) 10 MG tablet  Vitamin D deficiency - Plan: Vitamin D, Ergocalciferol, (DRISDOL) 50000 units CAPS capsule  Prediabetes - Plan: metFORMIN (GLUCOPHAGE) 500 MG tablet  Class 3 severe obesity with serious comorbidity and body mass index (BMI) of 40.0 to 44.9 in adult, unspecified obesity type ([place])  PLAN:  Hyperlipidemia Alissia was informed of the American Heart Association Guidelines emphasizing intensive lifestyle modifications as the first line treatment for hyperlipidemia. We discussed many lifestyle modifications today in depth, and Jahnae will continue to work on decreasing saturated fats such as fatty red meat, butter and many fried foods. She will also increase vegetables and lean protein in her diet and continue to work on exercise and weight loss efforts. She agrees to continue Crestor 10 mg #30 with no refills and will follow up at the agreed upon time.  Vitamin D Deficiency Stacey Nichols was informed that low vitamin D levels contributes to fatigue and are associated with  obesity, breast, and colon cancer. She agrees to continue to take prescription Vit D @50 ,000 IU every week #4 with no refills and will follow up for routine testing of vitamin D, at least 2-3 times per year. She was informed of the risk of over-replacement of vitamin D and agrees to not increase her dose unless  he discusses this with [place] first. Stacey Nichols agrees to follow up with our clinic in 2 weeks.  Pre-Diabetes Stacey Nichols will continue to work on weight loss, exercise, and decreasing simple carbohydrates in her diet to help decrease the risk of diabetes. We dicussed metformin including benefits and risks. She was informed that eating too many simple carbohydrates or too many calories at one sitting increases the likelihood of GI side effects. Stacey Nichols requested metformin for now and a prescription was written today for metformin 500 mg bid #60 with no refills. Stacey Nichols agreed to follow up with [place] as directed to monitor her progress.  Obesity Stacey Nichols is currently in the action stage of change. As such, her goal is to continue with weight loss efforts She has agreed to keep a food journal with 1400 to 1500 calories and 95 grams of protein daily Stacey Nichols has been instructed to work up to a goal of 150 minutes of combined cardio and strengthening exercise per week for weight loss and overall health benefits. We discussed the following Behavioral Modification Strategies today: increasing lean protein intake and keep a strict food journal  Stacey Nichols has agreed to follow up with our clinic in 2 weeks. She was informed of the importance of frequent follow up visits to maximize her success with intensive lifestyle modifications for her multiple health conditions.  I, Doreene Nest, am acting as transcriptionist for Lacy Duverney, PA-C  I have reviewed the above documentation for accuracy and completeness, and I agree with the above. -Lacy Duverney, PA-C  I have reviewed the above note and agree with the plan. -Dennard Nip, MD   OBESITY BEHAVIORAL INTERVENTION VISIT  Today's visit was # 8 out of 22.  Starting weight: 246 lbs Starting date: 01/21/17 Today's weight : 233 lbs  Today's date: 06/03/2017 Total lbs lost to date: 13 (Patients must lose 7 lbs in the first 6 months to continue with counseling)   ASK: We discussed  the diagnosis of obesity with Stacey Nichols today and Stacey Nichols agreed to give [place] permission to discuss obesity behavioral modification therapy today.  ASSESS: Stacey Nichols has the diagnosis of obesity and her BMI today is 41.28 Stacey Nichols is in the action stage of change   ADVISE: Stacey Nichols was educated on the multiple health risks of obesity as well as the benefit of weight loss to improve her health. She was advised of the need for long term treatment and the importance of lifestyle modifications.  AGREE: Multiple dietary modification options and treatment options were discussed and  Stacey Nichols agreed to keep a food journal with 1400 to 1500 calories and 95 grams of protein daily We discussed the following Behavioral Modification Strategies today: increasing lean protein intake and keep a strict food journal

## 2017-06-08 ENCOUNTER — Encounter: Payer: Self-pay | Admitting: Family Medicine

## 2017-06-08 DIAGNOSIS — C9201 Acute myeloblastic leukemia, in remission: Secondary | ICD-10-CM

## 2017-06-08 DIAGNOSIS — M8580 Other specified disorders of bone density and structure, unspecified site: Secondary | ICD-10-CM

## 2017-06-08 HISTORY — DX: Other specified disorders of bone density and structure, unspecified site: M85.80

## 2017-06-09 ENCOUNTER — Encounter: Payer: Self-pay | Admitting: Family Medicine

## 2017-06-17 ENCOUNTER — Ambulatory Visit (INDEPENDENT_AMBULATORY_CARE_PROVIDER_SITE_OTHER): Payer: BLUE CROSS/BLUE SHIELD | Admitting: Physician Assistant

## 2017-06-17 VITALS — BP 104/71 | HR 69 | Temp 97.8°F | Ht 63.0 in | Wt 228.0 lb

## 2017-06-17 DIAGNOSIS — R7303 Prediabetes: Secondary | ICD-10-CM | POA: Diagnosis not present

## 2017-06-17 DIAGNOSIS — E559 Vitamin D deficiency, unspecified: Secondary | ICD-10-CM | POA: Diagnosis not present

## 2017-06-17 DIAGNOSIS — Z6841 Body Mass Index (BMI) 40.0 and over, adult: Secondary | ICD-10-CM

## 2017-06-17 MED ORDER — METFORMIN HCL 500 MG PO TABS: 500.0000 mg | ORAL_TABLET | Freq: Two times a day (BID) | ORAL | 0 refills | Status: DC

## 2017-06-17 MED ORDER — VITAMIN D (ERGOCALCIFEROL) 1.25 MG (50000 UNIT) PO CAPS: 50000.0000 [IU] | ORAL_CAPSULE | ORAL | 0 refills | Status: DC

## 2017-06-17 NOTE — Progress Notes (Signed)
Office: [phone]  /  Fax: [phone]   HPI:   Chief Complaint: OBESITY Stacey Nichols is here to discuss her progress with her obesity treatment plan. She is on the keep a food journal with 1400-1500 calories and 90 grams of protein daily and is following her eating plan approximately 50 % of the time. She states she is exercising 0 minutes 0 times per week. Stacey Nichols continues to do well with weight loss. She plans her meals well and has been incorporating variety with her meals.  Her weight is 228 lb (103.4 kg) today and has had a weight loss of 5 pounds over a period of 2 weeks since her last visit. She has lost 18 lbs since starting treatment with [place].  Pre-Diabetes Stacey Nichols has a diagnosis of pre-diabetes based on her elevated Hgb A1c and was informed this puts her at greater risk of developing diabetes. She is taking metformin currently and continues to work on diet and exercise to decrease risk of diabetes. She denies nausea, polyphagia or hypoglycemia.  Vitamin D deficiency Stacey Nichols has a diagnosis of vitamin D deficiency. She is currently taking prescription Vit D and denies nausea, vomiting or muscle weakness.  ALLERGIES: Allergies  Allergen Reactions  . Latex Other (See Comments) and Itching  . Sulfa Antibiotics Rash    [Rash] rash - paper tape OK  . Tape Rash    [Rash] rash - paper tape OK    MEDICATIONS: Current Outpatient Medications on File Prior to Visit  Medication Sig Dispense Refill  . acetaminophen (TYLENOL) 500 MG tablet Take 500 mg by mouth every 6 (six) hours as needed.    . calcium carbonate (OS-CAL - DOSED IN MG OF ELEMENTAL CALCIUM) 1250 (500 Ca) MG tablet Take by mouth.    . carbamazepine (TEGRETOL XR) 200 MG 12 hr tablet Take 200 mg by mouth daily.   2  . Cyanocobalamin (VITAMIN B-12) 1000 MCG/15ML LIQD Take 500 mcg by mouth once a week.    . EPIPEN 2-PAK 0.3 MG/0.3ML SOAJ injection USE AS DIRECTED AS NEEDED  1  . fluticasone (FLONASE) 50 MCG/ACT nasal spray Place  1-2 sprays into both nostrils 3 (three) times a week.     Marland Kitchen ibuprofen (ADVIL,MOTRIN) 200 MG tablet Take 400 mg by mouth every 6 (six) hours as needed.     . Iron TABS Take 1 tablet by mouth daily.    . montelukast (SINGULAIR) 10 MG tablet Take 1 tablet (10 mg total) by mouth daily. 90 tablet 1  . Multiple Vitamins-Minerals (MULTI COMPLETE PO) Take 1 tablet by mouth daily.    . rosuvastatin (CRESTOR) 10 MG tablet Take 1 tablet (10 mg total) by mouth at bedtime. 90 tablet 0   No current facility-administered medications on file prior to visit.     PAST MEDICAL HISTORY: Past Medical History:  Diagnosis Date  . AML (acute myeloblastic leukemia) ([place]) 2007   s/p chemo (Dr Lissa Merlin)  . Anemia    hx of  . Anxiety   . Arthritis    severe  . Constipation   . Depression   . GERD (gastroesophageal reflux disease)   . H/O Bell's palsy   . H/O hiatal hernia   . Headache    occasional  . High triglycerides   . History of cancer chemotherapy   . Leukemia ([place])   . Osteopenia 06/08/2017   DEXA 2017 - T -1.8 spine  . Pneumonia    hx of   . Rheumatoid arthritis ([place])   .  Sleep apnea   . Stroke ([place])    "mini stroke-caused bells palsy for 1 month"    PAST SURGICAL HISTORY: Past Surgical History:  Procedure Laterality Date  . ABDOMINAL HYSTERECTOMY  2001   heavy bleeding, precancerous cells, 1 ovary remains  . LAPAROSCOPIC GASTRIC BAND REMOVAL WITH LAPAROSCOPIC GASTRIC SLEEVE RESECTION  04/26/2014  . LAPAROSCOPIC GASTRIC BANDING  2000   performed in [place]  . TUBAL LIGATION  1996  . UPPER GI ENDOSCOPY N/A 04/26/2014   Procedure: UPPER GI ENDOSCOPY;  Surgeon: Pedro Earls, MD;  Location: [facility] ORS;  Service: General;  Laterality: N/A;    SOCIAL HISTORY: Social History   Tobacco Use  . Smoking status: Current Some Day Smoker    Years: 30.00    Types: Cigarettes  . Smokeless tobacco: Never Used  . Tobacco comment: Once every week or 2  Substance Use Topics  . Alcohol use: Yes     Comment: occasional  . Drug use: No    FAMILY HISTORY: Family History  Problem Relation Age of Onset  . Cancer Mother        cervix  . Hyperlipidemia Father   . Obesity Father   . CAD Father        MI and arrhythmia  . Cancer Sister        metastatic, ?pancrease primary  . Diabetes Maternal Grandfather     ROS: Review of Systems  Constitutional: Positive for weight loss.  Gastrointestinal: Negative for nausea and vomiting.  Musculoskeletal:       Negative muscle weakness  Endo/Heme/Allergies:       Negative polyphagia Negative hypoglycemia    PHYSICAL EXAM: Blood pressure 104/71, pulse 69, temperature 97.8 F (36.6 C), height 5\' 3"  (1.6 m), weight 228 lb (103.4 kg), SpO2 96 %. Body mass index is 40.39 kg/m. Physical Exam  Constitutional: She is oriented to person, place, and time. She appears well-developed and well-nourished.  Cardiovascular: Normal rate.  Pulmonary/Chest: Effort normal.  Musculoskeletal: Normal range of motion.  Neurological: She is oriented to person, place, and time.  Skin: Skin is warm and dry.  Psychiatric: She has a normal mood and affect. Her behavior is normal.  Vitals reviewed.   RECENT LABS AND TESTS: BMET    Component Value Date/Time   NA 141 05/05/2017 1100   NA 142 01/14/2013 1757   K 4.5 05/05/2017 1100   K 4.6 01/14/2013 1757   CL 104 05/05/2017 1100   CL 107 01/14/2013 1757   CO2 24 05/05/2017 1100   CO2 29 01/14/2013 1757   GLUCOSE 97 05/05/2017 1100   GLUCOSE 106 (H) 12/25/2016 1201   GLUCOSE 95 01/14/2013 1757   BUN 20 05/05/2017 1100   BUN 11 01/14/2013 1757   CREATININE 0.77 05/05/2017 1100   CREATININE 0.76 12/25/2016 1201   CALCIUM 9.5 05/05/2017 1100   CALCIUM 9.2 01/14/2013 1757   GFRNONAA 87 05/05/2017 1100   GFRNONAA 89 12/25/2016 1201   GFRAA 101 05/05/2017 1100   GFRAA >89 12/25/2016 1201   Lab Results  Component Value Date   HGBA1C 5.9 (H) 05/05/2017   HGBA1C 6.0 (H) 01/21/2017   HGBA1C 6.0  05/27/2016   HGBA1C 6.2 (H) 03/01/2014   Lab Results  Component Value Date   INSULIN 26.0 (H) 05/05/2017   INSULIN 42.1 (H) 01/21/2017   CBC    Component Value Date/Time   WBC 6.2 05/05/2017 1100   WBC 8.3 08/27/2016 0904   RBC 4.55 05/05/2017 1100  RBC 4.48 08/27/2016 0904   HGB 14.5 05/05/2017 1100   HCT 43.1 05/05/2017 1100   PLT 166 10/28/2016   PLT 214 06/20/2015 1141   MCV 95 05/05/2017 1100   MCV 96 01/15/2013 0522   MCH 31.9 05/05/2017 1100   MCH 32.6 08/27/2016 0904   MCHC 33.6 05/05/2017 1100   MCHC 33.2 08/27/2016 0904   RDW 14.6 05/05/2017 1100   RDW 14.3 01/15/2013 0522   LYMPHSABS 2.1 05/05/2017 1100   LYMPHSABS 3.6 01/15/2013 0522   MONOABS 664 08/27/2016 0904   MONOABS 0.8 01/15/2013 0522   EOSABS 0.2 05/05/2017 1100   EOSABS 0.3 01/15/2013 0522   BASOSABS 0.0 05/05/2017 1100   BASOSABS 0.0 01/15/2013 0522   Iron/TIBC/Ferritin/ %Sat No results found for: IRON, TIBC, FERRITIN, IRONPCTSAT Lipid Panel     Component Value Date/Time   CHOL 136 05/05/2017 1100   CHOL 190 01/15/2013 0522   TRIG 219 (H) 05/05/2017 1100   TRIG 307 (H) 01/15/2013 0522   HDL 52 05/05/2017 1100   HDL 44 01/15/2013 0522   CHOLHDL 2.1 08/27/2016 0904   VLDL 21 08/27/2016 0904   VLDL 61 (H) 01/15/2013 0522   LDLCALC 40 05/05/2017 1100   LDLCALC 85 01/15/2013 0522   Hepatic Function Panel     Component Value Date/Time   PROT 6.8 05/05/2017 1100   PROT 7.7 01/14/2013 1757   ALBUMIN 4.4 05/05/2017 1100   ALBUMIN 3.8 01/14/2013 1757   AST 15 05/05/2017 1100   AST 25 01/14/2013 1757   ALT 18 05/05/2017 1100   ALT 39 01/14/2013 1757   ALKPHOS 70 05/05/2017 1100   ALKPHOS 100 01/14/2013 1757   BILITOT 0.3 05/05/2017 1100   BILITOT 0.3 01/14/2013 1757      Component Value Date/Time   TSH 3.850 01/21/2017 1128   TSH 2.09 12/25/2016 1201   TSH 3.163 03/01/2014 1201    ASSESSMENT AND PLAN: Prediabetes - Plan: metFORMIN (GLUCOPHAGE) 500 MG tablet  Vitamin D  deficiency - Plan: Vitamin D, Ergocalciferol, (DRISDOL) 50000 units CAPS capsule  Class 3 severe obesity with serious comorbidity and body mass index (BMI) of 40.0 to 44.9 in adult, unspecified obesity type ([place])  PLAN:  Pre-Diabetes Stacey Nichols will continue to work on weight loss, exercise, and decreasing simple carbohydrates in her diet to help decrease the risk of diabetes. We dicussed metformin including benefits and risks. She was informed that eating too many simple carbohydrates or too many calories at one sitting increases the likelihood of GI side effects. Stacey Nichols agrees to continue taking metformin 500 mg BID #60 and we will refill for 1 month. Stacey Nichols agrees to follow up with our clinic in 2 weeks as directed to monitor her progress.  Vitamin D Deficiency Stacey Nichols was informed that low vitamin D levels contributes to fatigue and are associated with obesity, breast, and colon cancer. Stacey Nichols agrees to continue taking prescription Vit D @50 ,000 IU every week #4 and we will refill for 1 month. She will follow up for routine testing of vitamin D, at least 2-3 times per year. She was informed of the risk of over-replacement of vitamin D and agrees to not increase her dose unless he discusses this with [place] first.  Obesity Stacey Nichols is currently in the action stage of change. As such, her goal is to continue with weight loss efforts She has agreed to keep a food journal with 1400-1500 calories and 90 grams of protein daily Stacey Nichols has been instructed to work up to a goal  of 150 minutes of combined cardio and strengthening exercise per week for weight loss and overall health benefits. We discussed the following Behavioral Modification Strategies today: increasing lean protein intake and work on meal planning and easy cooking plans   Stacey Nichols has agreed to follow up with our clinic in 2 weeks. She was informed of the importance of frequent follow up visits to maximize her success with intensive lifestyle modifications  for her multiple health conditions.  I, Stacey Nichols, am acting as transcriptionist for Stacey Duverney, PA-C  I have reviewed the above documentation for accuracy and completeness, and I agree with the above. -Stacey Duverney, PA-C  I have reviewed the above note and agree with the plan. -Stacey Nip, MD     Today's visit was # 9 out of 22.  Starting weight: 246 lbs Starting date: 01/21/17 Today's weight : 228 lbs  Today's date: 06/17/2017 Total lbs lost to date: 36 (Patients must lose 7 lbs in the first 6 months to continue with counseling)   ASK: We discussed the diagnosis of obesity with Stacey Nichols today and Stacey Nichols agreed to give [place] permission to discuss obesity behavioral modification therapy today.  ASSESS: Stacey Nichols has the diagnosis of obesity and her BMI today is 40.4 Stacey Nichols is in the action stage of change   ADVISE: Stacey Nichols was educated on the multiple health risks of obesity as well as the benefit of weight loss to improve her health. She was advised of the need for long term treatment and the importance of lifestyle modifications.  AGREE: Multiple dietary modification options and treatment options were discussed and  Stacey Nichols agreed to keep a food journal with 1400-1500 calories and 90 grams of protein daily We discussed the following Behavioral Modification Strategies today: increasing lean protein intake and work on meal planning and easy cooking plans

## 2017-06-26 ENCOUNTER — Ambulatory Visit (INDEPENDENT_AMBULATORY_CARE_PROVIDER_SITE_OTHER): Payer: BLUE CROSS/BLUE SHIELD | Admitting: Dietician

## 2017-06-26 VITALS — Ht 63.0 in | Wt 233.0 lb

## 2017-06-26 DIAGNOSIS — Z9189 Other specified personal risk factors, not elsewhere classified: Secondary | ICD-10-CM

## 2017-06-26 DIAGNOSIS — Z6841 Body Mass Index (BMI) 40.0 and over, adult: Secondary | ICD-10-CM | POA: Diagnosis not present

## 2017-06-26 DIAGNOSIS — R7303 Prediabetes: Secondary | ICD-10-CM

## 2017-06-26 NOTE — Progress Notes (Signed)
  Office: [phone]  /  Fax: [phone]     Stacey Nichols has a diagnosis of prediabetes based on her elevated HgA1c and was informed this puts her at greater risk of developing diabetes. She is here today for diabetes risk nutrition counseling.   Stacey Nichols's weight today is 233  lbs, a 5 lb weight gain since her last visit. She has had a total weight loss of 13 lbs since beginning treatment with [place].   Although she was given instructions for journaling at her last appointment with nutrition goals of 1400-1500 calories/day and 90+ grams of protein she states she has not been journaling but asked for further instruction on using the mobile application [employer]itness Pal to journal her food intake daily. Intensive education provided today on using the application as well as making appropriate meal choices. Per review of her food journal and interview Stacey Nichols had struggled to meet her recommended protein needs and her intake of processed carbohydrates and oils and butters has increased.   Patient was educated about food nutrients ie protein, fats, simple and complex carbohydrates and how these affect insulin response. Focus on portion control,  avoiding simple carbohydrates and lower fat foods and increasing lean proteins for ongoing wt loss efforts and glucose management.    is on the following meal plan 1400-1500 calories and 90 grams of protein. Her meal plan was individualized for maximum benefit.  Also discussed at length the following behavioral modifications to help maximize  Success: increasing lean protein intake, decreasing simple carbohydrates,  meal planning and cooking strategies, keeping healthy foods in the home,  keeping a strict food journal.    @FNAME @ has been instructed to work up to a goal of 150 minutes of combined cardio and strengthening exercise per week for weight loss and overall health benefits. Written information was provided and the following handouts were given: protein content of  foods.    Office: [phone]  /  Fax: [phone]  OBESITY BEHAVIORAL INTERVENTION VISIT  Today's visit was # 10 out of 22.  Starting weight: 246 lbs Starting date: 01/21/17 Today's weight : Weight: 233 lb (105.7 kg)  Today's date: 06/26/2017 Total lbs lost to date: 13 (Patients must lose 7 lbs in the first 6 months to continue with counseling)   ASK: We discussed the diagnosis of obesity with Stacey Nichols today and Stacey Nichols agreed to give [place] permission to discuss obesity behavioral modification therapy today.  ASSESS: Stacey Nichols has the diagnosis of obesity and her BMI today is 69.3 Stacey Nichols is in the action stage of change   ADVISE: Stacey Nichols was educated on the multiple health risks of obesity as well as the benefit of weight loss to improve her health. She was advised of the need for long term treatment and the importance of lifestyle modifications.  AGREE: Multiple dietary modification options and treatment options were discussed and  Stacey Nichols agreed to keep a food journal with 1400-1500 calories and 90+ protein  We discussed the following Behavioral Modification Stratagies today: increasing lean protein intake, decreasing simple carbohydrates  and work on meal planning and easy cooking plans

## 2017-07-09 ENCOUNTER — Ambulatory Visit (INDEPENDENT_AMBULATORY_CARE_PROVIDER_SITE_OTHER): Payer: BLUE CROSS/BLUE SHIELD | Admitting: Physician Assistant

## 2017-07-09 VITALS — BP 113/81 | HR 81 | Temp 98.1°F | Ht 63.0 in | Wt 231.0 lb

## 2017-07-09 DIAGNOSIS — Z6841 Body Mass Index (BMI) 40.0 and over, adult: Secondary | ICD-10-CM

## 2017-07-09 DIAGNOSIS — R7303 Prediabetes: Secondary | ICD-10-CM

## 2017-07-09 NOTE — Progress Notes (Signed)
Office: [phone]  /  Fax: [phone]   HPI:   Chief Complaint: OBESITY Terrace is here to discuss her progress with her obesity treatment plan. She is on the keep a food journal with 1400-1500 calories and 90 grams of protein daily and is following her eating plan approximately 30 % of the time. She states she is exercising 0 minutes 0 times per week. Elzena continues to do well with weight loss. She is very mindful of her eating and continues to journal her meals. She requests a longer follow up, as she is busy with court hearings.  Her weight is 231 lb (104.8 kg) today and has had a weight loss of 2 pounds over a period of 3 weeks since her last visit. She has lost 15 lbs since starting treatment with [place].  Pre-Diabetes Yahaira has a diagnosis of pre-diabetes based on her elevated Hgb A1c and was informed this puts her at greater risk of developing diabetes. Last A1c was 5.9. She is taking metformin currently and continues to work on diet and exercise to decrease risk of diabetes. She denies polyphagia, nausea or hypoglycemia.  ALLERGIES: Allergies  Allergen Reactions  . Latex Other (See Comments) and Itching  . Sulfa Antibiotics Rash    [Rash] rash - paper tape OK  . Tape Rash    [Rash] rash - paper tape OK    MEDICATIONS: Current Outpatient Medications on File Prior to Visit  Medication Sig Dispense Refill  . acetaminophen (TYLENOL) 500 MG tablet Take 500 mg by mouth every 6 (six) hours as needed.    . calcium carbonate (OS-CAL - DOSED IN MG OF ELEMENTAL CALCIUM) 1250 (500 Ca) MG tablet Take by mouth.    . carbamazepine (TEGRETOL XR) 200 MG 12 hr tablet Take 200 mg by mouth daily.   2  . Cyanocobalamin (VITAMIN B-12) 1000 MCG/15ML LIQD Take 500 mcg by mouth once a week.    . EPIPEN 2-PAK 0.3 MG/0.3ML SOAJ injection USE AS DIRECTED AS NEEDED  1  . fluticasone (FLONASE) 50 MCG/ACT nasal spray Place 1-2 sprays into both nostrils 3 (three) times a week.     Marland Kitchen ibuprofen  (ADVIL,MOTRIN) 200 MG tablet Take 400 mg by mouth every 6 (six) hours as needed.     . Iron TABS Take 1 tablet by mouth daily.    . metFORMIN (GLUCOPHAGE) 500 MG tablet Take 1 tablet (500 mg total) by mouth 2 (two) times daily with a meal. 60 tablet 0  . montelukast (SINGULAIR) 10 MG tablet Take 1 tablet (10 mg total) by mouth daily. 90 tablet 1  . Multiple Vitamins-Minerals (MULTI COMPLETE PO) Take 1 tablet by mouth daily.    . rosuvastatin (CRESTOR) 10 MG tablet Take 1 tablet (10 mg total) by mouth at bedtime. 90 tablet 0  . Vitamin D, Ergocalciferol, (DRISDOL) 50000 units CAPS capsule Take 1 capsule (50,000 Units total) by mouth every 7 (seven) days. 4 capsule 0   No current facility-administered medications on file prior to visit.     PAST MEDICAL HISTORY: Past Medical History:  Diagnosis Date  . 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 Anemia    hx of  . Anxiety   . Arthritis    severe  . Constipation   . Depression   . GERD (gastroesophageal reflux disease)   . H/O Bell's palsy   . H/O hiatal hernia   . Headache    occasional  . High  triglycerides   . History of cancer chemotherapy   . Leukemia ([place])   . Osteopenia 06/08/2017   DEXA 2017 - T -1.8 spine  . Pneumonia    hx of   . Rheumatoid arthritis ([place])   . Sleep apnea   . Stroke ([place])    "mini stroke-caused bells palsy for 1 month"    PAST SURGICAL HISTORY: Past Surgical History:  Procedure Laterality Date  . ABDOMINAL HYSTERECTOMY  2001   heavy bleeding, precancerous cells, 1 ovary remains  . LAPAROSCOPIC GASTRIC BAND REMOVAL WITH LAPAROSCOPIC GASTRIC SLEEVE RESECTION  04/26/2014  . LAPAROSCOPIC GASTRIC BANDING  2000   performed in [place]  . TUBAL LIGATION  1996  . UPPER GI ENDOSCOPY N/A 04/26/2014   Procedure: UPPER GI ENDOSCOPY;  Surgeon: Pedro Earls, MD;  Location: [facility];  Service: General;  Laterality: N/A;    SOCIAL HISTORY: Social History   Tobacco Use  .  Smoking status: Current Some Day Smoker    Years: 30.00    Types: Cigarettes  . Smokeless tobacco: Never Used  . Tobacco comment: Once every week or 2  Substance Use Topics  . Alcohol use: Yes    Comment: occasional  . Drug use: No    FAMILY HISTORY: Family History  Problem Relation Age of Onset  . Cancer Mother        cervix  . Hyperlipidemia Father   . Obesity Father   . CAD Father        MI and arrhythmia  . Cancer Sister        metastatic, ?pancrease primary  . Diabetes Maternal Grandfather     ROS: Review of Systems  Constitutional: Positive for weight loss.  Gastrointestinal: Negative for nausea.  Endo/Heme/Allergies:       Negative polyphagia Negative hypoglycemia    PHYSICAL EXAM: Blood pressure 113/81, pulse 81, temperature 98.1 F (36.7 C), temperature source Oral, height 5\' 3"  (1.6 m), weight 231 lb (104.8 kg), SpO2 96 %. Body mass index is 40.92 kg/m. Physical Exam  Constitutional: She is oriented to person, place, and time. She appears well-developed and well-nourished.  Cardiovascular: Normal rate.  Pulmonary/Chest: Effort normal.  Musculoskeletal: Normal range of motion.  Neurological: She is oriented to person, place, and time.  Skin: Skin is warm and dry.  Psychiatric: She has a normal mood and affect. Her behavior is normal.  Vitals reviewed.   RECENT LABS AND TESTS: BMET    Component Value Date/Time   NA 141 05/05/2017 1100   NA 142 01/14/2013 1757   K 4.5 05/05/2017 1100   K 4.6 01/14/2013 1757   CL 104 05/05/2017 1100   CL 107 01/14/2013 1757   CO2 24 05/05/2017 1100   CO2 29 01/14/2013 1757   GLUCOSE 97 05/05/2017 1100   GLUCOSE 106 (H) 12/25/2016 1201   GLUCOSE 95 01/14/2013 1757   BUN 20 05/05/2017 1100   BUN 11 01/14/2013 1757   CREATININE 0.77 05/05/2017 1100   CREATININE 0.76 12/25/2016 1201   CALCIUM 9.5 05/05/2017 1100   CALCIUM 9.2 01/14/2013 1757   GFRNONAA 87 05/05/2017 1100   GFRNONAA 89 12/25/2016 1201   GFRAA  101 05/05/2017 1100   GFRAA >89 12/25/2016 1201   Lab Results  Component Value Date   HGBA1C 5.9 (H) 05/05/2017   HGBA1C 6.0 (H) 01/21/2017   HGBA1C 6.0 05/27/2016   HGBA1C 6.2 (H) 03/01/2014   Lab Results  Component Value Date   INSULIN 26.0 (H) 05/05/2017  INSULIN 42.1 (H) 01/21/2017   CBC    Component Value Date/Time   WBC 6.2 05/05/2017 1100   WBC 8.3 08/27/2016 0904   RBC 4.55 05/05/2017 1100   RBC 4.48 08/27/2016 0904   HGB 14.5 05/05/2017 1100   HCT 43.1 05/05/2017 1100   PLT 166 10/28/2016   PLT 214 06/20/2015 1141   MCV 95 05/05/2017 1100   MCV 96 01/15/2013 0522   MCH 31.9 05/05/2017 1100   MCH 32.6 08/27/2016 0904   MCHC 33.6 05/05/2017 1100   MCHC 33.2 08/27/2016 0904   RDW 14.6 05/05/2017 1100   RDW 14.3 01/15/2013 0522   LYMPHSABS 2.1 05/05/2017 1100   LYMPHSABS 3.6 01/15/2013 0522   MONOABS 664 08/27/2016 0904   MONOABS 0.8 01/15/2013 0522   EOSABS 0.2 05/05/2017 1100   EOSABS 0.3 01/15/2013 0522   BASOSABS 0.0 05/05/2017 1100   BASOSABS 0.0 01/15/2013 0522   Iron/TIBC/Ferritin/ %Sat No results found for: IRON, TIBC, FERRITIN, IRONPCTSAT Lipid Panel     Component Value Date/Time   CHOL 136 05/05/2017 1100   CHOL 190 01/15/2013 0522   TRIG 219 (H) 05/05/2017 1100   TRIG 307 (H) 01/15/2013 0522   HDL 52 05/05/2017 1100   HDL 44 01/15/2013 0522   CHOLHDL 2.1 08/27/2016 0904   VLDL 21 08/27/2016 0904   VLDL 61 (H) 01/15/2013 0522   LDLCALC 40 05/05/2017 1100   LDLCALC 85 01/15/2013 0522   Hepatic Function Panel     Component Value Date/Time   PROT 6.8 05/05/2017 1100   PROT 7.7 01/14/2013 1757   ALBUMIN 4.4 05/05/2017 1100   ALBUMIN 3.8 01/14/2013 1757   AST 15 05/05/2017 1100   AST 25 01/14/2013 1757   ALT 18 05/05/2017 1100   ALT 39 01/14/2013 1757   ALKPHOS 70 05/05/2017 1100   ALKPHOS 100 01/14/2013 1757   BILITOT 0.3 05/05/2017 1100   BILITOT 0.3 01/14/2013 1757      Component Value Date/Time   TSH 3.850 01/21/2017 1128    TSH 2.09 12/25/2016 1201   TSH 3.163 03/01/2014 1201    ASSESSMENT AND PLAN: Prediabetes  Class 3 severe obesity with serious comorbidity and body mass index (BMI) of 40.0 to 44.9 in adult, unspecified obesity type ([place])  PLAN:  Pre-Diabetes Lariah will continue to work on weight loss, exercise, and decreasing simple carbohydrates in her diet to help decrease the risk of diabetes. We dicussed metformin including benefits and risks. She was informed that eating too many simple carbohydrates or too many calories at one sitting increases the likelihood of GI side effects. Olivianna agrees to continue her medications as prescribed. Sana agrees to follow up with our clinic in 2 weeks as directed to monitor her progress.  We spent > than 50% of the 15 minute visit on the counseling as documented in the note.  Obesity Wiley is currently in the action stage of change. As such, her goal is to continue with weight loss efforts She has agreed to keep a food journal with 1400-1500 calories and 90 grams of protein daily Dondrea has been instructed to work up to a goal of 150 minutes of combined cardio and strengthening exercise per week for weight loss and overall health benefits. We discussed the following Behavioral Modification Strategies today: increasing lean protein intake and planning for success   Twala has agreed to follow up with our clinic in 2 weeks. She was informed of the importance of frequent follow up visits to maximize her success  with intensive lifestyle modifications for her multiple health conditions.  I, Trixie Dredge, am acting as transcriptionist for Lacy Duverney, PA-C  I have reviewed the above documentation for accuracy and completeness, and I agree with the above. -Lacy Duverney, PA-C  I have reviewed the above note and agree with the plan. -Dennard Nip, MD     Today's visit was # 10 out of 22.  Starting weight: 246 lbs Starting date: 01/21/17 Today's weight : 231 lbs    Today's date: 07/09/2017 Total lbs lost to date: 15 (Patients must lose 7 lbs in the first 6 months to continue with counseling)   ASK: We discussed the diagnosis of obesity with Windell Moment today and Tesa agreed to give [place] permission to discuss obesity behavioral modification therapy today.  ASSESS: Latria has the diagnosis of obesity and her BMI today is 40.93 Loany is in the action stage of change   ADVISE: Adriene was educated on the multiple health risks of obesity as well as the benefit of weight loss to improve her health. She was advised of the need for long term treatment and the importance of lifestyle modifications.  AGREE: Multiple dietary modification options and treatment options were discussed and  Cordella agreed to keep a food journal with 1400-1500 calories and 90 grams of protein daily We discussed the following Behavioral Modification Strategies today: increasing lean protein intake and planning for success

## 2017-07-24 ENCOUNTER — Ambulatory Visit (INDEPENDENT_AMBULATORY_CARE_PROVIDER_SITE_OTHER): Payer: BLUE CROSS/BLUE SHIELD | Admitting: Physician Assistant

## 2017-07-24 VITALS — BP 103/77 | HR 87 | Temp 98.1°F | Ht 63.0 in | Wt 230.0 lb

## 2017-07-24 DIAGNOSIS — E7849 Other hyperlipidemia: Secondary | ICD-10-CM

## 2017-07-24 DIAGNOSIS — R7303 Prediabetes: Secondary | ICD-10-CM | POA: Diagnosis not present

## 2017-07-24 DIAGNOSIS — Z6841 Body Mass Index (BMI) 40.0 and over, adult: Secondary | ICD-10-CM

## 2017-07-24 MED ORDER — METFORMIN HCL 500 MG PO TABS: 500.0000 mg | ORAL_TABLET | Freq: Two times a day (BID) | ORAL | 0 refills | Status: DC

## 2017-07-24 MED ORDER — ROSUVASTATIN CALCIUM 10 MG PO TABS: 10.0000 mg | ORAL_TABLET | Freq: Every day | ORAL | 0 refills | Status: DC

## 2017-07-24 NOTE — Progress Notes (Addendum)
Office: [phone]  /  Fax: [phone]   HPI:   Chief Complaint: OBESITY Stacey Nichols is here to discuss her progress with her obesity treatment plan. She is on the keep a food journal with 1400 to 1500 calories and 90 grams of protein daily and is following her eating plan approximately 50 % of the time. She states she is exercising 0 minutes 0 times per week. Stacey Nichols continues to do well with weight loss. She is mindful of her eating and incorporates more protein. Her weight is 230 lb (104.3 kg) today and has had a weight loss of 1 pound over a period of 2 weeks since her last visit. She has lost 16 lbs since starting treatment with [place].  Hyperlipidemia Stacey Nichols has hyperlipidemia and has been trying to improve her cholesterol levels with intensive lifestyle modification including a low saturated fat diet, exercise and weight loss. She denies any chest pain or claudication.  Pre-Diabetes Stacey Nichols has a diagnosis of pre-diabetes based on her elevated Hgb A1c and was informed this puts her at greater risk of developing diabetes. She is taking metformin currently and continues to work on diet and exercise to decrease risk of diabetes. She denies nausea, polyphagia or hypoglycemia.  ALLERGIES: Allergies  Allergen Reactions  . Latex Other (See Comments) and Itching  . Sulfa Antibiotics Rash    [Rash] rash - paper tape OK  . Tape Rash    [Rash] rash - paper tape OK    MEDICATIONS: Current Outpatient Medications on File Prior to Visit  Medication Sig Dispense Refill  . acetaminophen (TYLENOL) 500 MG tablet Take 500 mg by mouth every 6 (six) hours as needed.    . calcium carbonate (OS-CAL - DOSED IN MG OF ELEMENTAL CALCIUM) 1250 (500 Ca) MG tablet Take by mouth.    . carbamazepine (TEGRETOL XR) 200 MG 12 hr tablet Take 200 mg by mouth daily.   2  . Cyanocobalamin (VITAMIN B-12) 1000 MCG/15ML LIQD Take 500 mcg by mouth once a week.    . EPIPEN 2-PAK 0.3 MG/0.3ML SOAJ injection USE AS DIRECTED AS  NEEDED  1  . fluticasone (FLONASE) 50 MCG/ACT nasal spray Place 1-2 sprays into both nostrils 3 (three) times a week.     Marland Kitchen ibuprofen (ADVIL,MOTRIN) 200 MG tablet Take 400 mg by mouth every 6 (six) hours as needed.     . Iron TABS Take 1 tablet by mouth daily.    . meloxicam (MOBIC) 15 MG tablet Take 15 mg by mouth daily.    . metFORMIN (GLUCOPHAGE) 500 MG tablet Take 1 tablet (500 mg total) by mouth 2 (two) times daily with a meal. 60 tablet 0  . montelukast (SINGULAIR) 10 MG tablet Take 1 tablet (10 mg total) by mouth daily. 90 tablet 1  . Multiple Vitamins-Minerals (MULTI COMPLETE PO) Take 1 tablet by mouth daily.    . rosuvastatin (CRESTOR) 10 MG tablet Take 1 tablet (10 mg total) by mouth at bedtime. 90 tablet 0  . Vitamin D, Ergocalciferol, (DRISDOL) 50000 units CAPS capsule Take 1 capsule (50,000 Units total) by mouth every 7 (seven) days. 4 capsule 0   No current facility-administered medications on file prior to visit.     PAST MEDICAL HISTORY: Past Medical History:  Diagnosis Date  . 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 Anemia    hx of  . Anxiety   . Arthritis    severe  . Constipation   .  Depression   . GERD (gastroesophageal reflux disease)   . H/O Bell's palsy   . H/O hiatal hernia   . Headache    occasional  . High triglycerides   . History of cancer chemotherapy   . Leukemia ([place])   . Osteopenia 06/08/2017   DEXA 2017 - T -1.8 spine  . Pneumonia    hx of   . Rheumatoid arthritis ([place])   . Sleep apnea   . Stroke ([place])    "mini stroke-caused bells palsy for 1 month"    PAST SURGICAL HISTORY: Past Surgical History:  Procedure Laterality Date  . ABDOMINAL HYSTERECTOMY  2001   heavy bleeding, precancerous cells, 1 ovary remains  . LAPAROSCOPIC GASTRIC BAND REMOVAL WITH LAPAROSCOPIC GASTRIC SLEEVE RESECTION  04/26/2014  . LAPAROSCOPIC GASTRIC BANDING  2000   performed in [place]  . TUBAL LIGATION  1996  . UPPER GI  ENDOSCOPY N/A 04/26/2014   Procedure: UPPER GI ENDOSCOPY;  Surgeon: Pedro Earls, MD;  Location: [facility];  Service: General;  Laterality: N/A;    SOCIAL HISTORY: Social History   Tobacco Use  . Smoking status: Current Some Day Smoker    Years: 30.00    Types: Cigarettes  . Smokeless tobacco: Never Used  . Tobacco comment: Once every week or 2  Substance Use Topics  . Alcohol use: Yes    Comment: occasional  . Drug use: No    FAMILY HISTORY: Family History  Problem Relation Age of Onset  . Cancer Mother        cervix  . Hyperlipidemia Father   . Obesity Father   . CAD Father        MI and arrhythmia  . Cancer Sister        metastatic, ?pancrease primary  . Diabetes Maternal Grandfather     ROS: Review of Systems  Constitutional: Positive for weight loss.  Cardiovascular: Negative for chest pain and claudication.  Gastrointestinal: Negative for nausea.  Endo/Heme/Allergies:       Negative polyphagia Negative hypoglycemia    PHYSICAL EXAM: Blood pressure 103/77, pulse 87, temperature 98.1 F (36.7 C), temperature source Oral, height 5\' 3"  (1.6 m), weight 230 lb (104.3 kg), SpO2 95 %. Body mass index is 40.74 kg/m. Physical Exam  Constitutional: She is oriented to person, place, and time. She appears well-developed and well-nourished.  Cardiovascular: Normal rate.  Pulmonary/Chest: Effort normal.  Musculoskeletal: Normal range of motion.  Neurological: She is oriented to person, place, and time.  Skin: Skin is warm and dry.  Psychiatric: She has a normal mood and affect. Her behavior is normal.  Vitals reviewed.   RECENT LABS AND TESTS: BMET    Component Value Date/Time   NA 141 05/05/2017 1100   NA 142 01/14/2013 1757   K 4.5 05/05/2017 1100   K 4.6 01/14/2013 1757   CL 104 05/05/2017 1100   CL 107 01/14/2013 1757   CO2 24 05/05/2017 1100   CO2 29 01/14/2013 1757   GLUCOSE 97 05/05/2017 1100   GLUCOSE 106 (H) 12/25/2016 1201   GLUCOSE 95  01/14/2013 1757   BUN 20 05/05/2017 1100   BUN 11 01/14/2013 1757   CREATININE 0.77 05/05/2017 1100   CREATININE 0.76 12/25/2016 1201   CALCIUM 9.5 05/05/2017 1100   CALCIUM 9.2 01/14/2013 1757   GFRNONAA 87 05/05/2017 1100   GFRNONAA 89 12/25/2016 1201   GFRAA 101 05/05/2017 1100   GFRAA >89 12/25/2016 1201   Lab Results  Component Value Date  HGBA1C 5.9 (H) 05/05/2017   HGBA1C 6.0 (H) 01/21/2017   HGBA1C 6.0 05/27/2016   HGBA1C 6.2 (H) 03/01/2014   Lab Results  Component Value Date   INSULIN 26.0 (H) 05/05/2017   INSULIN 42.1 (H) 01/21/2017   CBC    Component Value Date/Time   WBC 6.2 05/05/2017 1100   WBC 8.3 08/27/2016 0904   RBC 4.55 05/05/2017 1100   RBC 4.48 08/27/2016 0904   HGB 14.5 05/05/2017 1100   HCT 43.1 05/05/2017 1100   PLT 166 10/28/2016   PLT 214 06/20/2015 1141   MCV 95 05/05/2017 1100   MCV 96 01/15/2013 0522   MCH 31.9 05/05/2017 1100   MCH 32.6 08/27/2016 0904   MCHC 33.6 05/05/2017 1100   MCHC 33.2 08/27/2016 0904   RDW 14.6 05/05/2017 1100   RDW 14.3 01/15/2013 0522   LYMPHSABS 2.1 05/05/2017 1100   LYMPHSABS 3.6 01/15/2013 0522   MONOABS 664 08/27/2016 0904   MONOABS 0.8 01/15/2013 0522   EOSABS 0.2 05/05/2017 1100   EOSABS 0.3 01/15/2013 0522   BASOSABS 0.0 05/05/2017 1100   BASOSABS 0.0 01/15/2013 0522   Iron/TIBC/Ferritin/ %Sat No results found for: IRON, TIBC, FERRITIN, IRONPCTSAT Lipid Panel     Component Value Date/Time   CHOL 136 05/05/2017 1100   CHOL 190 01/15/2013 0522   TRIG 219 (H) 05/05/2017 1100   TRIG 307 (H) 01/15/2013 0522   HDL 52 05/05/2017 1100   HDL 44 01/15/2013 0522   CHOLHDL 2.1 08/27/2016 0904   VLDL 21 08/27/2016 0904   VLDL 61 (H) 01/15/2013 0522   LDLCALC 40 05/05/2017 1100   LDLCALC 85 01/15/2013 0522   Hepatic Function Panel     Component Value Date/Time   PROT 6.8 05/05/2017 1100   PROT 7.7 01/14/2013 1757   ALBUMIN 4.4 05/05/2017 1100   ALBUMIN 3.8 01/14/2013 1757   AST 15 05/05/2017  1100   AST 25 01/14/2013 1757   ALT 18 05/05/2017 1100   ALT 39 01/14/2013 1757   ALKPHOS 70 05/05/2017 1100   ALKPHOS 100 01/14/2013 1757   BILITOT 0.3 05/05/2017 1100   BILITOT 0.3 01/14/2013 1757      Component Value Date/Time   TSH 3.850 01/21/2017 1128   TSH 2.09 12/25/2016 1201   TSH 3.163 03/01/2014 1201    ASSESSMENT AND PLAN: Other hyperlipidemia - Plan: rosuvastatin (CRESTOR) 10 MG tablet  Prediabetes - Plan: metFORMIN (GLUCOPHAGE) 500 MG tablet  Class 3 severe obesity with serious comorbidity and body mass index (BMI) of 40.0 to 44.9 in adult, unspecified obesity type ([place])  PLAN:  Hyperlipidemia Stacey Nichols was informed of the American Heart Association Guidelines emphasizing intensive lifestyle modifications as the first line treatment for hyperlipidemia. We discussed many lifestyle modifications today in depth, and Stacey Nichols will continue to work on decreasing saturated fats such as fatty red meat, butter and many fried foods. She will also increase vegetables and lean protein in her diet and continue to work on exercise and weight loss efforts. Stacey Nichols agrees to continue Crestor 10 mg qhs #30 with no refills and follow up as directed.  Pre-Diabetes Stacey Nichols will continue to work on weight loss, exercise, and decreasing simple carbohydrates in her diet to help decrease the risk of diabetes. We dicussed metformin including benefits and risks. She was informed that eating too many simple carbohydrates or too many calories at one sitting increases the likelihood of GI side effects. Stacey Nichols requested metformin for now and a prescription was written today for 1 month  refill . Naveyah agreed to follow up with [place] as directed to monitor her progress.  Obesity Stacey Nichols is currently in the action stage of change. As such, her goal is to continue with weight loss efforts She has agreed to keep a food journal with 1400 to 1500 calories and 70 grams of protein daily Stacey Nichols has been instructed to work up  to a goal of 150 minutes of combined cardio and strengthening exercise per week for weight loss and overall health benefits. We discussed the following Behavioral Modification Strategies today: increasing lean protein intake and keep a strict food journal  Stacey Nichols has agreed to follow up with our clinic in 2 weeks. She was informed of the importance of frequent follow up visits to maximize her success with intensive lifestyle modifications for her multiple health conditions.  Stacey Nichols, am acting as transcriptionist for Stacey Nichols, Stacey Nichols have reviewed this note and agree with its contents   OBESITY BEHAVIORAL INTERVENTION VISIT  Today's visit was # 11 out of 22.  Starting weight: 246 lbs Starting date: 01/21/17 Today's weight : 230 lbs Today's date: 07/24/2017 Total lbs lost to date: 16 (Patients must lose 7 lbs in the first 6 months to continue with counseling)   ASK: We discussed the diagnosis of obesity with Stacey Nichols today and Stacey Nichols agreed to give [place] permission to discuss obesity behavioral modification therapy today.  ASSESS: Stacey Nichols has the diagnosis of obesity and her BMI today is 40.75 Stacey Nichols is in the action stage of change   ADVISE: Stacey Nichols was educated on the multiple health risks of obesity as well as the benefit of weight loss to improve her health. She was advised of the need for long term treatment and the importance of lifestyle modifications.  AGREE: Multiple dietary modification options and treatment options were discussed and  Stacey Nichols agreed to the above obesity treatment plan.  I have reviewed the above documentation for accuracy and completeness, and I agree with the above. -Dennard Nip, MD

## 2017-08-07 ENCOUNTER — Ambulatory Visit (INDEPENDENT_AMBULATORY_CARE_PROVIDER_SITE_OTHER): Payer: BLUE CROSS/BLUE SHIELD | Admitting: Physician Assistant

## 2017-08-07 VITALS — BP 111/73 | HR 87 | Temp 97.9°F | Ht 63.0 in | Wt 226.0 lb

## 2017-08-07 DIAGNOSIS — E7849 Other hyperlipidemia: Secondary | ICD-10-CM

## 2017-08-07 DIAGNOSIS — E559 Vitamin D deficiency, unspecified: Secondary | ICD-10-CM | POA: Diagnosis not present

## 2017-08-07 DIAGNOSIS — Z6841 Body Mass Index (BMI) 40.0 and over, adult: Secondary | ICD-10-CM | POA: Diagnosis not present

## 2017-08-07 DIAGNOSIS — R7303 Prediabetes: Secondary | ICD-10-CM

## 2017-08-07 MED ORDER — ROSUVASTATIN CALCIUM 10 MG PO TABS: 10.0000 mg | ORAL_TABLET | Freq: Every day | ORAL | 0 refills | Status: DC

## 2017-08-07 MED ORDER — METFORMIN HCL 500 MG PO TABS: 500.0000 mg | ORAL_TABLET | Freq: Two times a day (BID) | ORAL | 0 refills | Status: DC

## 2017-08-07 MED ORDER — VITAMIN D (ERGOCALCIFEROL) 1.25 MG (50000 UNIT) PO CAPS: 50000.0000 [IU] | ORAL_CAPSULE | ORAL | 0 refills | Status: DC

## 2017-08-07 NOTE — Progress Notes (Signed)
Office: [phone]  /  Fax: [phone]   HPI:   Chief Complaint: OBESITY Stacey Nichols is here to discuss her progress with her obesity treatment plan. She is on the keep a food journal with 1400 to 1500 calories and 70 grams of protein daily and is following her eating plan approximately 80 % of the time. She states she is exercising 0 minutes 0 times per week. Stacey Nichols continues to do well with weight loss. She incorporates variety into her meals and states her hunger is well controlled. Stacey Nichols requests a longer follow up time. Her weight is 226 lb (102.5 kg) today and has had a weight loss of 4 pounds over a period of 2 weeks since her last visit. She has lost 20 lbs since starting treatment with [place].  Hyperlipidemia Stacey Nichols has hyperlipidemia and has been trying to improve her cholesterol levels with intensive lifestyle modification including a low saturated fat diet, exercise and weight loss. She denies any chest pain or claudication.  Vitamin D deficiency Stacey Nichols has a diagnosis of vitamin D deficiency. She is currently taking vit D and denies nausea, vomiting or muscle weakness.   Ref. Range 05/05/2017 11:00  Vitamin D, 25-Hydroxy Latest Ref Range: 30.0 - 100.0 ng/mL 34.7   Pre-Diabetes Stacey Nichols has a diagnosis of pre-diabetes based on her elevated Hgb A1c and was informed this puts her at greater risk of developing diabetes. She is taking metformin currently and continues to work on diet and exercise to decrease risk of diabetes. She denies nausea, polyphagia or hypoglycemia.  ALLERGIES: Allergies  Allergen Reactions  . Latex Other (See Comments) and Itching  . Sulfa Antibiotics Rash    [Rash] rash - paper tape OK  . Tape Rash    [Rash] rash - paper tape OK    MEDICATIONS: Current Outpatient Medications on File Prior to Visit  Medication Sig Dispense Refill  . acetaminophen (TYLENOL) 500 MG tablet Take 500 mg by mouth every 6 (six) hours as needed.    . calcium carbonate (OS-CAL -  DOSED IN MG OF ELEMENTAL CALCIUM) 1250 (500 Ca) MG tablet Take by mouth.    . carbamazepine (TEGRETOL XR) 200 MG 12 hr tablet Take 200 mg by mouth daily.   2  . Cyanocobalamin (VITAMIN B-12) 1000 MCG/15ML LIQD Take 500 mcg by mouth once a week.    . EPIPEN 2-PAK 0.3 MG/0.3ML SOAJ injection USE AS DIRECTED AS NEEDED  1  . fluticasone (FLONASE) 50 MCG/ACT nasal spray Place 1-2 sprays into both nostrils 3 (three) times a week.     Marland Kitchen ibuprofen (ADVIL,MOTRIN) 200 MG tablet Take 400 mg by mouth every 6 (six) hours as needed.     . Iron TABS Take 1 tablet by mouth daily.    . meloxicam (MOBIC) 15 MG tablet Take 15 mg by mouth daily.    . montelukast (SINGULAIR) 10 MG tablet Take 1 tablet (10 mg total) by mouth daily. 90 tablet 1  . Multiple Vitamins-Minerals (MULTI COMPLETE PO) Take 1 tablet by mouth daily.     No current facility-administered medications on file prior to visit.     PAST MEDICAL HISTORY: Past Medical History:  Diagnosis Date  . AML (acute myeloblastic leukemia) ([place]) 2007   s/p chemo (Dr Lissa Merlin)  . Anemia    hx of  . Anxiety   . Arthritis    severe  . Constipation   . Depression   . GERD (gastroesophageal reflux disease)   . H/O Bell's palsy   .  H/O hiatal hernia   . Headache    occasional  . High triglycerides   . History of cancer chemotherapy   . Leukemia ([place])   . Osteopenia 06/08/2017   DEXA 2017 - T -1.8 spine  . Pneumonia    hx of   . Rheumatoid arthritis ([place])   . Sleep apnea   . Stroke ([place])    "mini stroke-caused bells palsy for 1 month"    PAST SURGICAL HISTORY: Past Surgical History:  Procedure Laterality Date  . ABDOMINAL HYSTERECTOMY  2001   heavy bleeding, precancerous cells, 1 ovary remains  . LAPAROSCOPIC GASTRIC BAND REMOVAL WITH LAPAROSCOPIC GASTRIC SLEEVE RESECTION  04/26/2014  . LAPAROSCOPIC GASTRIC BANDING  2000   performed in [place]  . TUBAL LIGATION  1996  . UPPER GI ENDOSCOPY N/A 04/26/2014   Procedure: UPPER GI ENDOSCOPY;   Surgeon: Pedro Earls, MD;  Location: [facility];  Service: General;  Laterality: N/A;    SOCIAL HISTORY: Social History   Tobacco Use  . Smoking status: Current Some Day Smoker    Years: 30.00    Types: Cigarettes  . Smokeless tobacco: Never Used  . Tobacco comment: Once every week or 2  Substance Use Topics  . Alcohol use: Yes    Comment: occasional  . Drug use: No    FAMILY HISTORY: Family History  Problem Relation Age of Onset  . Cancer Mother        cervix  . Hyperlipidemia Father   . Obesity Father   . CAD Father        MI and arrhythmia  . Cancer Sister        metastatic, ?pancrease primary  . Diabetes Maternal Grandfather     ROS: Review of Systems  Constitutional: Positive for weight loss.  Cardiovascular: Negative for chest pain and claudication.  Gastrointestinal: Negative for nausea and vomiting.  Musculoskeletal:       Negative muscle weakness  Endo/Heme/Allergies:       Negative polyphagia Negative hypoglycemia    PHYSICAL EXAM: Blood pressure 111/73, pulse 87, temperature 97.9 F (36.6 C), temperature source Oral, height 5\' 3"  (1.6 m), weight 226 lb (102.5 kg), SpO2 97 %. Body mass index is 40.03 kg/m. Physical Exam  Constitutional: She is oriented to person, place, and time. She appears well-developed and well-nourished.  Cardiovascular: Normal rate.  Pulmonary/Chest: Effort normal.  Musculoskeletal: Normal range of motion.  Neurological: She is oriented to person, place, and time.  Skin: Skin is warm and dry.  Psychiatric: She has a normal mood and affect. Her behavior is normal.  Vitals reviewed.   RECENT LABS AND TESTS: BMET    Component Value Date/Time   NA 141 05/05/2017 1100   NA 142 01/14/2013 1757   K 4.5 05/05/2017 1100   K 4.6 01/14/2013 1757   CL 104 05/05/2017 1100   CL 107 01/14/2013 1757   CO2 24 05/05/2017 1100   CO2 29 01/14/2013 1757   GLUCOSE 97 05/05/2017 1100   GLUCOSE 106 (H) 12/25/2016 1201   GLUCOSE 95  01/14/2013 1757   BUN 20 05/05/2017 1100   BUN 11 01/14/2013 1757   CREATININE 0.77 05/05/2017 1100   CREATININE 0.76 12/25/2016 1201   CALCIUM 9.5 05/05/2017 1100   CALCIUM 9.2 01/14/2013 1757   GFRNONAA 87 05/05/2017 1100   GFRNONAA 89 12/25/2016 1201   GFRAA 101 05/05/2017 1100   GFRAA >89 12/25/2016 1201   Lab Results  Component Value Date   HGBA1C 5.9 (  H) 05/05/2017   HGBA1C 6.0 (H) 01/21/2017   HGBA1C 6.0 05/27/2016   HGBA1C 6.2 (H) 03/01/2014   Lab Results  Component Value Date   INSULIN 26.0 (H) 05/05/2017   INSULIN 42.1 (H) 01/21/2017   CBC    Component Value Date/Time   WBC 6.2 05/05/2017 1100   WBC 8.3 08/27/2016 0904   RBC 4.55 05/05/2017 1100   RBC 4.48 08/27/2016 0904   HGB 14.5 05/05/2017 1100   HCT 43.1 05/05/2017 1100   PLT 166 10/28/2016   PLT 214 06/20/2015 1141   MCV 95 05/05/2017 1100   MCV 96 01/15/2013 0522   MCH 31.9 05/05/2017 1100   MCH 32.6 08/27/2016 0904   MCHC 33.6 05/05/2017 1100   MCHC 33.2 08/27/2016 0904   RDW 14.6 05/05/2017 1100   RDW 14.3 01/15/2013 0522   LYMPHSABS 2.1 05/05/2017 1100   LYMPHSABS 3.6 01/15/2013 0522   MONOABS 664 08/27/2016 0904   MONOABS 0.8 01/15/2013 0522   EOSABS 0.2 05/05/2017 1100   EOSABS 0.3 01/15/2013 0522   BASOSABS 0.0 05/05/2017 1100   BASOSABS 0.0 01/15/2013 0522   Iron/TIBC/Ferritin/ %Sat No results found for: IRON, TIBC, FERRITIN, IRONPCTSAT Lipid Panel     Component Value Date/Time   CHOL 136 05/05/2017 1100   CHOL 190 01/15/2013 0522   TRIG 219 (H) 05/05/2017 1100   TRIG 307 (H) 01/15/2013 0522   HDL 52 05/05/2017 1100   HDL 44 01/15/2013 0522   CHOLHDL 2.1 08/27/2016 0904   VLDL 21 08/27/2016 0904   VLDL 61 (H) 01/15/2013 0522   LDLCALC 40 05/05/2017 1100   LDLCALC 85 01/15/2013 0522   Hepatic Function Panel     Component Value Date/Time   PROT 6.8 05/05/2017 1100   PROT 7.7 01/14/2013 1757   ALBUMIN 4.4 05/05/2017 1100   ALBUMIN 3.8 01/14/2013 1757   AST 15 05/05/2017  1100   AST 25 01/14/2013 1757   ALT 18 05/05/2017 1100   ALT 39 01/14/2013 1757   ALKPHOS 70 05/05/2017 1100   ALKPHOS 100 01/14/2013 1757   BILITOT 0.3 05/05/2017 1100   BILITOT 0.3 01/14/2013 1757      Component Value Date/Time   TSH 3.850 01/21/2017 1128   TSH 2.09 12/25/2016 1201   TSH 3.163 03/01/2014 1201     Ref. Range 05/05/2017 11:00  Vitamin D, 25-Hydroxy Latest Ref Range: 30.0 - 100.0 ng/mL 34.7   ASSESSMENT AND PLAN: Other hyperlipidemia - Plan: rosuvastatin (CRESTOR) 10 MG tablet  Vitamin D deficiency - Plan: Vitamin D, Ergocalciferol, (DRISDOL) 50000 units CAPS capsule  Prediabetes - Plan: metFORMIN (GLUCOPHAGE) 500 MG tablet  Class 3 severe obesity with serious comorbidity and body mass index (BMI) of 40.0 to 44.9 in adult, unspecified obesity type (Stacey Nichols)  PLAN:  Hyperlipidemia Stacey Nichols was informed of the American Heart Association Guidelines emphasizing intensive lifestyle modifications as the first line treatment for hyperlipidemia. We discussed many lifestyle modifications today in depth, and Stacey Nichols will continue to work on decreasing saturated fats such as fatty red meat, butter and many fried foods. She will also increase vegetables and lean protein in her diet and continue to work on exercise and weight loss efforts. Stacey Nichols agrees to continue crestor 10 mg #30 with no refills and will follow up as directed.  Vitamin D Deficiency Stacey Nichols was informed that low vitamin D levels contributes to fatigue and are associated with obesity, breast, and colon cancer. She agrees to continue to take prescription Vit D @50 ,000 IU every week #4  with no refills and will follow up for routine testing of vitamin D, at least 2-3 times per year. She was informed of the risk of over-replacement of vitamin D and agrees to not increase her dose unless she discusses this with [place] first. Stacey Nichols agrees to follow up with our clinic in 4 weeks.  Pre-Diabetes Stacey Nichols will continue to work on weight  loss, exercise, and decreasing simple carbohydrates in her diet to help decrease the risk of diabetes. We dicussed metformin including benefits and risks. She was informed that eating too many simple carbohydrates or too many calories at one sitting increases the likelihood of GI side effects. Stacey Nichols requested metformin for now and a prescription was written today for metformin 500 mg bid #60 with no refills. Stacey Nichols agreed to follow up with [place] as directed to monitor her progress.  Obesity Stacey Nichols is currently in the action stage of change. As such, her goal is to continue with weight loss efforts She has agreed to keep a food journal with 1400 to 1500 calories and 90+ grams of protein daily Stacey Nichols has been instructed to work up to a goal of 150 minutes of combined cardio and strengthening exercise per week for weight loss and overall health benefits. We discussed the following Behavioral Modification Strategies today: increasing lean protein intake and work on meal planning and easy cooking plans  Stacey Nichols has agreed to follow up with our clinic in 4 weeks. She was informed of the importance of frequent follow up visits to maximize her success with intensive lifestyle modifications for her multiple health conditions.   OBESITY BEHAVIORAL INTERVENTION VISIT  Today's visit was # 12 out of 22.  Starting weight: 246 lbs Starting date: 01/21/17 Today's weight : 226 lbs Today's date: 08/07/2017 Total lbs lost to date: 226 lbs (Patients must lose 7 lbs in the first 6 months to continue with counseling)   ASK: We discussed the diagnosis of obesity with Stacey Nichols today and Stacey Nichols agreed to give [place] permission to discuss obesity behavioral modification therapy today.  ASSESS: Stacey Nichols has the diagnosis of obesity and her BMI today is 40.04 Stacey Nichols is in the action stage of change   ADVISE: Stacey Nichols was educated on the multiple health risks of obesity as well as the benefit of weight loss to improve her  health. She was advised of the need for long term treatment and the importance of lifestyle modifications.  AGREE: Multiple dietary modification options and treatment options were discussed and  Stacey Nichols agreed to the above obesity treatment plan.   Corey Skains, am acting as transcriptionist for [employer], PA-C I, L[facility], have reviewed this note and agree with its content.

## 2017-09-04 ENCOUNTER — Ambulatory Visit (INDEPENDENT_AMBULATORY_CARE_PROVIDER_SITE_OTHER): Payer: BLUE CROSS/BLUE SHIELD | Admitting: Physician Assistant

## 2017-09-04 VITALS — BP 112/78 | HR 78 | Temp 97.7°F | Ht 63.0 in | Wt 230.0 lb

## 2017-09-04 DIAGNOSIS — Z6841 Body Mass Index (BMI) 40.0 and over, adult: Secondary | ICD-10-CM

## 2017-09-04 DIAGNOSIS — Z856 Personal history of leukemia: Secondary | ICD-10-CM

## 2017-09-04 DIAGNOSIS — R7303 Prediabetes: Secondary | ICD-10-CM | POA: Diagnosis not present

## 2017-09-04 DIAGNOSIS — E7849 Other hyperlipidemia: Secondary | ICD-10-CM

## 2017-09-04 DIAGNOSIS — E559 Vitamin D deficiency, unspecified: Secondary | ICD-10-CM

## 2017-09-04 DIAGNOSIS — Z9189 Other specified personal risk factors, not elsewhere classified: Secondary | ICD-10-CM

## 2017-09-04 NOTE — Progress Notes (Addendum)
Office: [phone]  /  Fax: [phone]   HPI:   Chief Complaint: OBESITY Stacey Nichols is here to discuss her progress with her obesity treatment plan. She is on the  keep a food journal with 1400-1500 calories and 90+ grams of protein  and is following her eating plan approximately 80 % of the time. She states she is not exercising.  Stacey Nichols has not kept up with her protein intake. Also, has not kept a strict food journal.  Her weight is 230 lb (104.3 kg) today and has had a weight gain of 4 pounds over a period of 4 weeks since her last visit. She has lost 16 lbs since starting treatment with [place].  Hyperlipidemia Stacey Nichols has hyperlipidemia and is currently on Crestor. She has been trying to improve her cholesterol levels with intensive lifestyle modification including a low saturated fat diet, exercise and weight loss. She denies any chest pain, claudication or myalgias.  Vitamin D deficiency Stacey Nichols has a diagnosis of vitamin D deficiency. She is currently taking vit D and denies nausea, vomiting or muscle weakness.   Ref. 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 She denies any symptoms such as fatigue, jaundice, night sweats, and recurrent infections.   Pre-Diabetes Stacey Nichols has a diagnosis of prediabetes based on her elevated HgA1c and was informed this puts her at greater risk of developing diabetes. She is taking metformin currently and continues to work on diet and exercise to decrease risk of diabetes. She denies nausea or hypoglycemia.  At risk for cardiovascular disease Stacey Nichols is at a higher than average risk for cardiovascular disease due to obesity. She currently denies any chest pain.   ALLERGIES: Allergies  Allergen Reactions  . Latex Other (See Comments) and Itching  . Sulfa Antibiotics Rash    [Rash] rash - paper tape OK  . Tape Rash    [Rash] rash -  paper tape OK    MEDICATIONS: Current Outpatient Medications on File Prior to Visit  Medication Sig Dispense Refill  . acetaminophen (TYLENOL) 500 MG tablet Take 500 mg by mouth every 6 (six) hours as needed.    . calcium carbonate (OS-CAL - DOSED IN MG OF ELEMENTAL CALCIUM) 1250 (500 Ca) MG tablet Take by mouth.    . carbamazepine (TEGRETOL XR) 200 MG 12 hr tablet Take 200 mg by mouth daily.   2  . Cyanocobalamin (VITAMIN B-12) 1000 MCG/15ML LIQD Take 500 mcg by mouth once a week.    . EPIPEN 2-PAK 0.3 MG/0.3ML SOAJ injection USE AS DIRECTED AS NEEDED  1  . fluticasone (FLONASE) 50 MCG/ACT nasal spray Place 1-2 sprays into both nostrils 3 (three) times a week.     Marland Kitchen ibuprofen (ADVIL,MOTRIN) 200 MG tablet Take 400 mg by mouth every 6 (six) hours as needed.     . Iron TABS Take 1 tablet by mouth daily.    . meloxicam (MOBIC) 15 MG tablet Take 15 mg by mouth daily.    . metFORMIN (GLUCOPHAGE) 500 MG tablet Take 1 tablet (500 mg total) by mouth 2 (two) times daily with a meal. 60 tablet 0  . montelukast (SINGULAIR) 10 MG tablet Take 1 tablet (10 mg total) by mouth daily. 90 tablet 1  . Multiple Vitamins-Minerals (MULTI COMPLETE PO) Take 1 tablet by mouth daily.    . rosuvastatin (CRESTOR) 10 MG tablet Take  1 tablet (10 mg total) by mouth at bedtime. 30 tablet 0  . Vitamin D, Ergocalciferol, (DRISDOL) 50000 units CAPS capsule Take 1 capsule (50,000 Units total) by mouth every 7 (seven) days. 4 capsule 0   No current facility-administered medications on file prior to visit.     PAST MEDICAL HISTORY: Past Medical History:  Diagnosis Date  . 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 Anemia    hx of  . Anxiety   . Arthritis    severe  . Constipation   . Depression   . GERD (gastroesophageal reflux disease)   . H/O Bell's palsy   . H/O hiatal hernia   . Headache    occasional  . High triglycerides   . History of cancer chemotherapy   . Leukemia ([place])     . Osteopenia 06/08/2017   DEXA 2017 - T -1.8 spine  . Pneumonia    hx of   . Rheumatoid arthritis ([place])   . Sleep apnea   . Stroke ([place])    "mini stroke-caused bells palsy for 1 month"    PAST SURGICAL HISTORY: Past Surgical History:  Procedure Laterality Date  . ABDOMINAL HYSTERECTOMY  2001   heavy bleeding, precancerous cells, 1 ovary remains  . LAPAROSCOPIC GASTRIC BAND REMOVAL WITH LAPAROSCOPIC GASTRIC SLEEVE RESECTION  04/26/2014  . LAPAROSCOPIC GASTRIC BANDING  2000   performed in [place]  . TUBAL LIGATION  1996  . UPPER GI ENDOSCOPY N/A 04/26/2014   Procedure: UPPER GI ENDOSCOPY;  Surgeon: Pedro Earls, MD;  Location: [facility];  Service: General;  Laterality: N/A;    SOCIAL HISTORY: Social History   Tobacco Use  . Smoking status: Current Some Day Smoker    Years: 30.00    Types: Cigarettes  . Smokeless tobacco: Never Used  . Tobacco comment: Once every week or 2  Substance Use Topics  . Alcohol use: Yes    Comment: occasional  . Drug use: No    FAMILY HISTORY: Family History  Problem Relation Age of Onset  . Cancer Mother        cervix  . Hyperlipidemia Father   . Obesity Father   . CAD Father        MI and arrhythmia  . Cancer Sister        metastatic, ?pancrease primary  . Diabetes Maternal Grandfather     ROS: Review of Systems  Constitutional: Negative for malaise/fatigue and weight loss.       Negative for night sweats  Negative for recurrent infections  Cardiovascular: Negative for chest pain and claudication.  Gastrointestinal: Negative for nausea and vomiting.  Musculoskeletal:       Negative for muscle weakness  Skin:       Negative for jaundice  Endo/Heme/Allergies:       Negative for hypoglycemia  Negative for polyphagia     PHYSICAL EXAM: Blood pressure 112/78, pulse 78, temperature 97.7 F (36.5 C), temperature source Oral, height 5\' 3"  (1.6 m), weight 230 lb (104.3 kg), SpO2 98 %. Body mass index is 40.74 kg/m. Physical  Exam  Constitutional: She is oriented to person, place, and time. She appears well-developed and well-nourished.  HENT:  Head: Normocephalic.  Eyes: EOM are normal.  Neck: Normal range of motion.  Cardiovascular: Normal rate.  Pulmonary/Chest: Effort normal.  Musculoskeletal: Normal range of motion.  Neurological: She is alert and oriented to person, place, and time.  Skin: Skin is warm and  dry.  Psychiatric: She has a normal mood and affect. Her behavior is normal.  Vitals reviewed.   RECENT LABS AND TESTS: BMET    Component Value Date/Time   NA 141 05/05/2017 1100   NA 142 01/14/2013 1757   K 4.5 05/05/2017 1100   K 4.6 01/14/2013 1757   CL 104 05/05/2017 1100   CL 107 01/14/2013 1757   CO2 24 05/05/2017 1100   CO2 29 01/14/2013 1757   GLUCOSE 97 05/05/2017 1100   GLUCOSE 106 (H) 12/25/2016 1201   GLUCOSE 95 01/14/2013 1757   BUN 20 05/05/2017 1100   BUN 11 01/14/2013 1757   CREATININE 0.77 05/05/2017 1100   CREATININE 0.76 12/25/2016 1201   CALCIUM 9.5 05/05/2017 1100   CALCIUM 9.2 01/14/2013 1757   GFRNONAA 87 05/05/2017 1100   GFRNONAA 89 12/25/2016 1201   GFRAA 101 05/05/2017 1100   GFRAA >89 12/25/2016 1201   Lab Results  Component Value Date   HGBA1C 5.9 (H) 05/05/2017   HGBA1C 6.0 (H) 01/21/2017   HGBA1C 6.0 05/27/2016   HGBA1C 6.2 (H) 03/01/2014   Lab Results  Component Value Date   INSULIN 26.0 (H) 05/05/2017   INSULIN 42.1 (H) 01/21/2017   CBC    Component Value Date/Time   WBC 6.2 05/05/2017 1100   WBC 8.3 08/27/2016 0904   RBC 4.55 05/05/2017 1100   RBC 4.48 08/27/2016 0904   HGB 14.5 05/05/2017 1100   HCT 43.1 05/05/2017 1100   PLT 166 10/28/2016   PLT 214 06/20/2015 1141   MCV 95 05/05/2017 1100   MCV 96 01/15/2013 0522   MCH 31.9 05/05/2017 1100   MCH 32.6 08/27/2016 0904   MCHC 33.6 05/05/2017 1100   MCHC 33.2 08/27/2016 0904   RDW 14.6 05/05/2017 1100   RDW 14.3 01/15/2013 0522   LYMPHSABS 2.1 05/05/2017 1100   LYMPHSABS 3.6  01/15/2013 0522   MONOABS 664 08/27/2016 0904   MONOABS 0.8 01/15/2013 0522   EOSABS 0.2 05/05/2017 1100   EOSABS 0.3 01/15/2013 0522   BASOSABS 0.0 05/05/2017 1100   BASOSABS 0.0 01/15/2013 0522   Iron/TIBC/Ferritin/ %Sat No results found for: IRON, TIBC, FERRITIN, IRONPCTSAT Lipid Panel     Component Value Date/Time   CHOL 136 05/05/2017 1100   CHOL 190 01/15/2013 0522   TRIG 219 (H) 05/05/2017 1100   TRIG 307 (H) 01/15/2013 0522   HDL 52 05/05/2017 1100   HDL 44 01/15/2013 0522   CHOLHDL 2.1 08/27/2016 0904   VLDL 21 08/27/2016 0904   VLDL 61 (H) 01/15/2013 0522   LDLCALC 40 05/05/2017 1100   LDLCALC 85 01/15/2013 0522   Hepatic Function Panel     Component Value Date/Time   PROT 6.8 05/05/2017 1100   PROT 7.7 01/14/2013 1757   ALBUMIN 4.4 05/05/2017 1100   ALBUMIN 3.8 01/14/2013 1757   AST 15 05/05/2017 1100   AST 25 01/14/2013 1757   ALT 18 05/05/2017 1100   ALT 39 01/14/2013 1757   ALKPHOS 70 05/05/2017 1100   ALKPHOS 100 01/14/2013 1757   BILITOT 0.3 05/05/2017 1100   BILITOT 0.3 01/14/2013 1757      Component Value Date/Time   TSH 3.850 01/21/2017 1128   TSH 2.09 12/25/2016 1201   TSH 3.163 03/01/2014 1201    Ref. Range 05/05/2017 11:00  Vitamin D, 25-Hydroxy Latest Ref Range: 30.0 - 100.0 ng/mL 34.7    ASSESSMENT AND PLAN: Other hyperlipidemia - Plan: Lipid Panel With LDL/HDL Ratio  Vitamin D deficiency -  Plan: VITAMIN D 25 Hydroxy (Vit-D Deficiency, Fractures)  History of leukemia - Plan: CBC With Differential  Prediabetes - Plan: Comprehensive metabolic panel, Hemoglobin A1c, Insulin, random  At risk for heart disease  Class 3 severe obesity with serious comorbidity and body mass index (BMI) of 40.0 to 44.9 in adult, unspecified obesity type ([place])  PLAN: Hyperlipidemia Stacey Nichols was informed of the American Heart Association Guidelines emphasizing intensive lifestyle modifications as the first line treatment for hyperlipidemia. We discussed  many lifestyle modifications today in depth, and Stacey Nichols will continue to work on decreasing saturated fats such as fatty red meat, butter and many fried foods. She will also increase vegetables and lean protein in her diet and continue to work on exercise and weight loss efforts. We will check labs today.   Vitamin D Deficiency Stacey Nichols was informed that low vitamin D levels contributes to fatigue and are associated with obesity, breast, and colon cancer. She agrees to continue to take prescription Vit D @50 ,000 IU every week and will follow up for routine testing of vitamin D, at least 2-3 times per year. She was informed of the risk of over-replacement of vitamin D and agrees to not increase her dose unless she discusses this with [place] first. We will check labs today.  History of Leukemia, ALM  Stacey Nichols has a history of Leukemia. We will do a CBC today. Stacey Nichols will follow up with [place] in 2 weeks.  Pre-Diabetes Stacey Nichols will continue to work on weight loss, exercise, and decreasing simple carbohydrates in her diet to help decrease the risk of diabetes. We dicussed metformin including benefits and risks. She was informed that eating too many simple carbohydrates or too many calories at one sitting increases the likelihood of GI side effects. Stacey Nichols agrees to continue metformin as prescribed.  Stacey Nichols agreed to follow up with [place] as directed to monitor her progress.  Cardiovascular risk counseling Stacey Nichols was given extended (15 minutes) coronary artery disease prevention counseling today. She is 57 y.o. female and has risk factors for heart disease including obesity. We discussed intensive lifestyle modifications today with an emphasis on specific weight loss instructions and strategies. Pt was also informed of the importance of increasing exercise and decreasing saturated fats to help prevent heart disease.   Obesity Stacey Nichols is currently in the action stage of change. As such, her goal is to continue with weight loss  efforts She has agreed to follow a lower carbohydrate, vegetable and lean protein rich diet plan Stacey Nichols has been instructed to work up to a goal of 150 minutes of combined cardio and strengthening exercise per week for weight loss and overall health benefits. We discussed the following Behavioral Modification Strategies today: increasing lean protein intake and decreasing simple carbohydrates    Stacey Nichols has agreed to follow up with our clinic in 2 weeks. She was informed of the importance of frequent follow up visits to maximize her success with intensive lifestyle modifications for her multiple health conditions.    Today's visit was # 13 out of 22.  Starting weight: 246 lbs Starting date: 01/21/17 Today's weight : 230 lbs Today's date: 09/04/2017 Total lbs lost to date: 16 (Patients must lose 7 lbs in the first 6 months to continue with counseling)   ASK: We discussed the diagnosis of obesity with Stacey Nichols today and Stacey Nichols agreed to give [place] permission to discuss obesity behavioral modification therapy today.  ASSESS: Stacey Nichols has the diagnosis of obesity and her BMI today is 40.74 Stacey Nichols is  in the action stage of change   ADVISE: Stacey Nichols was educated on the multiple health risks of obesity as well as the benefit of weight loss to improve her health. She was advised of the need for long term treatment and the importance of lifestyle modifications.  AGREE: Multiple dietary modification options and treatment options were discussed and  Stacey Nichols agreed to the above obesity treatment plan.   Leary Roca, am acting as transcriptionist for [employer], PA-C I, Lacy Duverney [facility], have reviewed this note and agree with its content.

## 2017-09-05 LAB — COMPREHENSIVE METABOLIC PANEL
A/G RATIO: 1.8 (ref 1.2–2.2)
ALBUMIN: 4.7 g/dL (ref 3.5–5.5)
ALT: 19 IU/L (ref 0–32)
AST: 18 IU/L (ref 0–40)
Alkaline Phosphatase: 82 IU/L (ref 39–117)
BILIRUBIN TOTAL: 0.3 mg/dL (ref 0.0–1.2)
BUN / CREAT RATIO: 27 — AB (ref 9–23)
BUN: 18 mg/dL (ref 6–24)
CALCIUM: 9.5 mg/dL (ref 8.7–10.2)
CHLORIDE: 104 mmol/L (ref 96–106)
CO2: 23 mmol/L (ref 20–29)
Creatinine, Ser: 0.66 mg/dL (ref 0.57–1.00)
GFR, EST AFRICAN AMERICAN: 115 mL/min/{1.73_m2} (ref >59)
GFR, EST NON AFRICAN AMERICAN: 100 mL/min/{1.73_m2} (ref >59)
GLOBULIN, TOTAL: 2.6 g/dL (ref 1.5–4.5)
Glucose: 107 mg/dL — ABNORMAL HIGH (ref 65–99)
POTASSIUM: 5 mmol/L (ref 3.5–5.2)
Sodium: 144 mmol/L (ref 134–144)
TOTAL PROTEIN: 7.3 g/dL (ref 6.0–8.5)

## 2017-09-05 LAB — CBC WITH DIFFERENTIAL
BASOS ABS: 0 10*3/uL (ref 0.0–0.2)
Basos: 0 %
EOS (ABSOLUTE): 0.2 10*3/uL (ref 0.0–0.4)
Eos: 4 %
HEMOGLOBIN: 14.5 g/dL (ref 11.1–15.9)
Hematocrit: 42.8 % (ref 34.0–46.6)
Immature Grans (Abs): 0 10*3/uL (ref 0.0–0.1)
Immature Granulocytes: 0 %
LYMPHS ABS: 2.3 10*3/uL (ref 0.7–3.1)
Lymphs: 39 %
MCH: 33 pg (ref 26.6–33.0)
MCHC: 33.9 g/dL (ref 31.5–35.7)
MCV: 98 fL — ABNORMAL HIGH (ref 79–97)
MONOCYTES: 8 %
MONOS ABS: 0.4 10*3/uL (ref 0.1–0.9)
Neutrophils Absolute: 2.8 10*3/uL (ref 1.4–7.0)
Neutrophils: 49 %
RBC: 4.39 x10E6/uL (ref 3.77–5.28)
RDW: 13.8 % (ref 12.3–15.4)
WBC: 5.7 10*3/uL (ref 3.4–10.8)

## 2017-09-05 LAB — LIPID PANEL WITH LDL/HDL RATIO
Cholesterol, Total: 155 mg/dL (ref 100–199)
HDL: 57 mg/dL (ref >39)
LDL CALC: 62 mg/dL (ref 0–99)
LDL/HDL RATIO: 1.1 ratio (ref 0.0–3.2)
TRIGLYCERIDES: 179 mg/dL — AB (ref 0–149)
VLDL Cholesterol Cal: 36 mg/dL (ref 5–40)

## 2017-09-05 LAB — HEMOGLOBIN A1C
Est. average glucose Bld gHb Est-mCnc: 123 mg/dL
HEMOGLOBIN A1C: 5.9 % — AB (ref 4.8–5.6)

## 2017-09-05 LAB — INSULIN, RANDOM: INSULIN: 33.6 u[IU]/mL — ABNORMAL HIGH (ref 2.6–24.9)

## 2017-09-05 LAB — VITAMIN D 25 HYDROXY (VIT D DEFICIENCY, FRACTURES): Vit D, 25-Hydroxy: 42 ng/mL (ref 30.0–100.0)

## 2017-09-18 ENCOUNTER — Ambulatory Visit (INDEPENDENT_AMBULATORY_CARE_PROVIDER_SITE_OTHER): Payer: BLUE CROSS/BLUE SHIELD | Admitting: Physician Assistant

## 2017-09-18 VITALS — BP 101/69 | HR 73 | Temp 97.9°F | Ht 63.0 in | Wt 230.0 lb

## 2017-09-18 DIAGNOSIS — R7303 Prediabetes: Secondary | ICD-10-CM | POA: Diagnosis not present

## 2017-09-18 DIAGNOSIS — Z9189 Other specified personal risk factors, not elsewhere classified: Secondary | ICD-10-CM | POA: Diagnosis not present

## 2017-09-18 DIAGNOSIS — E559 Vitamin D deficiency, unspecified: Secondary | ICD-10-CM

## 2017-09-18 DIAGNOSIS — Z6841 Body Mass Index (BMI) 40.0 and over, adult: Secondary | ICD-10-CM | POA: Diagnosis not present

## 2017-09-18 MED ORDER — METFORMIN HCL 500 MG PO TABS: 500.0000 mg | ORAL_TABLET | Freq: Two times a day (BID) | ORAL | 0 refills | Status: DC

## 2017-09-18 NOTE — Progress Notes (Signed)
Office: [phone]  /  Fax: [phone]   HPI:   Chief Complaint: OBESITY Stacey Nichols is here to discuss her progress with her obesity treatment plan. She is on the lower carbohydrate, vegetable and lean protein rich diet plan and is following her eating plan approximately 30 % of the time. She states she is exercising 0 minutes 0 times per week. Stacey Nichols maintained her weight. She states she could not do the low carb meal plan. She has been controlling her portions and making smarter food choices. She continues to struggle to get her recommended daily protein. Her weight is 230 lb (104.3 kg) today and she has maintained weight over a period of 2 weeks since her last visit. She has lost 16 lbs since starting treatment with [place].  Pre-Diabetes Stacey Nichols has a diagnosis of pre-diabetes based on her elevated Hgb A1c and was informed this puts her at greater risk of developing diabetes. She is taking metformin currently and continues to work on diet and exercise to decrease risk of diabetes. She denies nausea, polyphagia or hypoglycemia.  At risk for diabetes Stacey Nichols is at higher than average risk for developing diabetes due to her obesity and pre-diabetes. She currently denies polyuria or polydipsia.  Vitamin D deficiency Stacey Nichols has a diagnosis of vitamin D deficiency. She is currently taking vit D and denies nausea, vomiting or muscle weakness.   Ref. Range 09/04/2017 10:55  Vitamin D, 25-Hydroxy Latest Ref Range: 30.0 - 100.0 ng/mL 42.0   ALLERGIES: Allergies  Allergen Reactions  . Latex Other (See Comments) and Itching  . Sulfa Antibiotics Rash    [Rash] rash - paper tape OK  . Tape Rash    [Rash] rash - paper tape OK    MEDICATIONS: Current Outpatient Medications on File Prior to Visit  Medication Sig Dispense Refill  . acetaminophen (TYLENOL) 500 MG tablet Take 500 mg by mouth every 6 (six) hours as needed.    . calcium carbonate (OS-CAL - DOSED IN MG OF ELEMENTAL CALCIUM) 1250 (500 Ca) MG  tablet Take by mouth.    . carbamazepine (TEGRETOL XR) 200 MG 12 hr tablet Take 200 mg by mouth daily.   2  . Cyanocobalamin (VITAMIN B-12) 1000 MCG/15ML LIQD Take 500 mcg by mouth once a week.    . EPIPEN 2-PAK 0.3 MG/0.3ML SOAJ injection USE AS DIRECTED AS NEEDED  1  . fluticasone (FLONASE) 50 MCG/ACT nasal spray Place 1-2 sprays into both nostrils 3 (three) times a week.     . Iron TABS Take 1 tablet by mouth daily.    . meloxicam (MOBIC) 15 MG tablet Take 15 mg by mouth daily.    . metFORMIN (GLUCOPHAGE) 500 MG tablet Take 1 tablet (500 mg total) by mouth 2 (two) times daily with a meal. 60 tablet 0  . montelukast (SINGULAIR) 10 MG tablet Take 1 tablet (10 mg total) by mouth daily. 90 tablet 1  . Multiple Vitamins-Minerals (MULTI COMPLETE PO) Take 1 tablet by mouth daily.    . rosuvastatin (CRESTOR) 10 MG tablet Take 1 tablet (10 mg total) by mouth at bedtime. 30 tablet 0  . Vitamin D, Ergocalciferol, (DRISDOL) 50000 units CAPS capsule Take 1 capsule (50,000 Units total) by mouth every 7 (seven) days. 4 capsule 0   No current facility-administered medications on file prior to visit.     PAST MEDICAL HISTORY: Past Medical History:  Diagnosis Date  . 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  Anemia    hx of  . Anxiety   . Arthritis    severe  . Constipation   . Depression   . GERD (gastroesophageal reflux disease)   . H/O Bell's palsy   . H/O hiatal hernia   . Headache    occasional  . High triglycerides   . History of cancer chemotherapy   . Leukemia ([place])   . Osteopenia 06/08/2017   DEXA 2017 - T -1.8 spine  . Pneumonia    hx of   . Rheumatoid arthritis ([place])   . Sleep apnea   . Stroke ([place])    "mini stroke-caused bells palsy for 1 month"    PAST SURGICAL HISTORY: Past Surgical History:  Procedure Laterality Date  . ABDOMINAL HYSTERECTOMY  2001   heavy bleeding, precancerous cells, 1 ovary remains  . LAPAROSCOPIC GASTRIC BAND REMOVAL  WITH LAPAROSCOPIC GASTRIC SLEEVE RESECTION  04/26/2014  . LAPAROSCOPIC GASTRIC BANDING  2000   performed in [place]  . TUBAL LIGATION  1996  . UPPER GI ENDOSCOPY N/A 04/26/2014   Procedure: UPPER GI ENDOSCOPY;  Surgeon: Pedro Earls, MD;  Location: [facility];  Service: General;  Laterality: N/A;    SOCIAL HISTORY: Social History   Tobacco Use  . Smoking status: Current Some Day Smoker    Years: 30.00    Types: Cigarettes  . Smokeless tobacco: Never Used  . Tobacco comment: Once every week or 2  Substance Use Topics  . Alcohol use: Yes    Comment: occasional  . Drug use: No    FAMILY HISTORY: Family History  Problem Relation Age of Onset  . Cancer Mother        cervix  . Hyperlipidemia Father   . Obesity Father   . CAD Father        MI and arrhythmia  . Cancer Sister        metastatic, ?pancrease primary  . Diabetes Maternal Grandfather     ROS: Review of Systems  Constitutional: Negative for weight loss.  Gastrointestinal: Negative for nausea and vomiting.  Genitourinary: Negative for frequency.  Musculoskeletal:       Negative for muscle weakness  Endo/Heme/Allergies: Negative for polydipsia.       Negative for polyphagia Negative for hypoglycemia    PHYSICAL EXAM: Blood pressure 101/69, pulse 73, temperature 97.9 F (36.6 C), temperature source Oral, height 5\' 3"  (1.6 m), weight 230 lb (104.3 kg), SpO2 95 %. Body mass index is 40.74 kg/m. Physical Exam  Constitutional: She is oriented to person, place, and time. She appears well-developed and well-nourished.  Cardiovascular: Normal rate.  Pulmonary/Chest: Effort normal.  Musculoskeletal: Normal range of motion.  Neurological: She is oriented to person, place, and time.  Skin: Skin is warm and dry.  Psychiatric: She has a normal mood and affect. Her behavior is normal.  Vitals reviewed.   RECENT LABS AND TESTS: BMET    Component Value Date/Time   NA 144 09/04/2017 1055   NA 142 01/14/2013 1757    K 5.0 09/04/2017 1055   K 4.6 01/14/2013 1757   CL 104 09/04/2017 1055   CL 107 01/14/2013 1757   CO2 23 09/04/2017 1055   CO2 29 01/14/2013 1757   GLUCOSE 107 (H) 09/04/2017 1055   GLUCOSE 106 (H) 12/25/2016 1201   GLUCOSE 95 01/14/2013 1757   BUN 18 09/04/2017 1055   BUN 11 01/14/2013 1757   CREATININE 0.66 09/04/2017 1055   CREATININE 0.76 12/25/2016 1201   CALCIUM 9.5  09/04/2017 1055   CALCIUM 9.2 01/14/2013 1757   GFRNONAA 100 09/04/2017 1055   GFRNONAA 89 12/25/2016 1201   GFRAA 115 09/04/2017 1055   GFRAA >89 12/25/2016 1201   Lab Results  Component Value Date   HGBA1C 5.9 (H) 09/04/2017   HGBA1C 5.9 (H) 05/05/2017   HGBA1C 6.0 (H) 01/21/2017   HGBA1C 6.0 05/27/2016   HGBA1C 6.2 (H) 03/01/2014   Lab Results  Component Value Date   INSULIN 33.6 (H) 09/04/2017   INSULIN 26.0 (H) 05/05/2017   INSULIN 42.1 (H) 01/21/2017   CBC    Component Value Date/Time   WBC 5.7 09/04/2017 1055   WBC 8.3 08/27/2016 0904   RBC 4.39 09/04/2017 1055   RBC 4.48 08/27/2016 0904   HGB 14.5 09/04/2017 1055   HCT 42.8 09/04/2017 1055   PLT 166 10/28/2016   PLT 214 06/20/2015 1141   MCV 98 (H) 09/04/2017 1055   MCV 96 01/15/2013 0522   MCH 33.0 09/04/2017 1055   MCH 32.6 08/27/2016 0904   MCHC 33.9 09/04/2017 1055   MCHC 33.2 08/27/2016 0904   RDW 13.8 09/04/2017 1055   RDW 14.3 01/15/2013 0522   LYMPHSABS 2.3 09/04/2017 1055   LYMPHSABS 3.6 01/15/2013 0522   MONOABS 664 08/27/2016 0904   MONOABS 0.8 01/15/2013 0522   EOSABS 0.2 09/04/2017 1055   EOSABS 0.3 01/15/2013 0522   BASOSABS 0.0 09/04/2017 1055   BASOSABS 0.0 01/15/2013 0522   Iron/TIBC/Ferritin/ %Sat No results found for: IRON, TIBC, FERRITIN, IRONPCTSAT Lipid Panel     Component Value Date/Time   CHOL 155 09/04/2017 1055   CHOL 190 01/15/2013 0522   TRIG 179 (H) 09/04/2017 1055   TRIG 307 (H) 01/15/2013 0522   HDL 57 09/04/2017 1055   HDL 44 01/15/2013 0522   CHOLHDL 2.1 08/27/2016 0904   VLDL 21  08/27/2016 0904   VLDL 61 (H) 01/15/2013 0522   LDLCALC 62 09/04/2017 1055   LDLCALC 85 01/15/2013 0522   Hepatic Function Panel     Component Value Date/Time   PROT 7.3 09/04/2017 1055   PROT 7.7 01/14/2013 1757   ALBUMIN 4.7 09/04/2017 1055   ALBUMIN 3.8 01/14/2013 1757   AST 18 09/04/2017 1055   AST 25 01/14/2013 1757   ALT 19 09/04/2017 1055   ALT 39 01/14/2013 1757   ALKPHOS 82 09/04/2017 1055   ALKPHOS 100 01/14/2013 1757   BILITOT 0.3 09/04/2017 1055   BILITOT 0.3 01/14/2013 1757      Component Value Date/Time   TSH 3.850 01/21/2017 1128   TSH 2.09 12/25/2016 1201   TSH 3.163 03/01/2014 1201    Ref. Range 09/04/2017 10:55  Vitamin D, 25-Hydroxy Latest Ref Range: 30.0 - 100.0 ng/mL 42.0   ASSESSMENT AND PLAN: Prediabetes - Plan: metFORMIN (GLUCOPHAGE) 500 MG tablet  Vitamin D deficiency  At risk for diabetes mellitus  Class 3 severe obesity with serious comorbidity and body mass index (BMI) of 40.0 to 44.9 in adult, unspecified obesity type Stacey Nichols)  PLAN:  Pre-Diabetes Stacey Nichols will continue to work on weight loss, exercise, and decreasing simple carbohydrates in her diet to help decrease the risk of diabetes. We dicussed metformin including benefits and risks. She was informed that eating too many simple carbohydrates or too many calories at one sitting increases the likelihood of GI side effects. Stacey Nichols requested metformin for now and a prescription was written today for metformin 500 mg bid #60 with no refills. Stacey Nichols agreed to follow up with [place] as directed  to monitor her progress.  Diabetes risk counseling Stacey Nichols was given extended (15 minutes) diabetes prevention counseling today. She is 57 y.o. female and has risk factors for diabetes including obesity and pre-diabetes. We discussed intensive lifestyle modifications today with an emphasis on weight loss as well as increasing exercise and decreasing simple carbohydrates in her diet.  Vitamin D Deficiency Stacey Nichols was  informed that low vitamin D levels contributes to fatigue and are associated with obesity, breast, and colon cancer. She agrees to continue to take prescription Vit D @50 ,000 IU every week and will follow up for routine testing of vitamin D, at least 2-3 times per year. She was informed of the risk of over-replacement of vitamin D and agrees to not increase her dose unless she discusses this with [place] first.  Obesity Stacey Nichols is currently in the action stage of change. As such, her goal is to continue with weight loss efforts She has agreed to follow the Category 2 plan Stacey Nichols has been instructed to work up to a goal of 150 minutes of combined cardio and strengthening exercise per week for weight loss and overall health benefits. We discussed the following Behavioral Modification Strategies today: increasing lean protein intake and work on meal planning and easy cooking plans  Stacey Nichols has agreed to follow up with our clinic in 2 weeks. She was informed of the importance of frequent follow up visits to maximize her success with intensive lifestyle modifications for her multiple health conditions.   OBESITY BEHAVIORAL INTERVENTION VISIT  Today's visit was # 14 out of 22.  Starting weight: 246 lbs Starting date: 01/21/17 Today's weight : 230 lbs Today's date: 09/18/2017 Total lbs lost to date: 16 (Patients must lose 7 lbs in the first 6 months to continue with counseling)   ASK: We discussed the diagnosis of obesity with Stacey Nichols today and Stacey Nichols agreed to give [place] permission to discuss obesity behavioral modification therapy today.  ASSESS: Stacey Nichols has the diagnosis of obesity and her BMI today is 40.75 Stacey Nichols is in the action stage of change   ADVISE: Stacey Nichols was educated on the multiple health risks of obesity as well as the benefit of weight loss to improve her health. She was advised of the need for long term treatment and the importance of lifestyle modifications.  AGREE: Multiple dietary  modification options and treatment options were discussed and  Michela agreed to the above obesity treatment plan.   Corey Skains, am acting as transcriptionist for [employer], PA-C I, [facility], have reviewed this note and agree with its content.

## 2017-10-02 ENCOUNTER — Ambulatory Visit (INDEPENDENT_AMBULATORY_CARE_PROVIDER_SITE_OTHER): Payer: BLUE CROSS/BLUE SHIELD | Admitting: Physician Assistant

## 2017-10-06 ENCOUNTER — Ambulatory Visit (INDEPENDENT_AMBULATORY_CARE_PROVIDER_SITE_OTHER): Payer: BLUE CROSS/BLUE SHIELD | Admitting: Physician Assistant

## 2017-10-06 VITALS — BP 107/72 | HR 80 | Temp 97.6°F | Ht 63.0 in | Wt 237.0 lb

## 2017-10-06 DIAGNOSIS — Z9189 Other specified personal risk factors, not elsewhere classified: Secondary | ICD-10-CM | POA: Diagnosis not present

## 2017-10-06 DIAGNOSIS — Z6841 Body Mass Index (BMI) 40.0 and over, adult: Secondary | ICD-10-CM

## 2017-10-06 DIAGNOSIS — K5909 Other constipation: Secondary | ICD-10-CM | POA: Diagnosis not present

## 2017-10-06 DIAGNOSIS — E7849 Other hyperlipidemia: Secondary | ICD-10-CM | POA: Diagnosis not present

## 2017-10-06 MED ORDER — POLYETHYLENE GLYCOL 3350 17 GM/SCOOP PO POWD: 17.0000 g | Freq: Every day | ORAL | 1 refills | Status: DC

## 2017-10-06 MED ORDER — ROSUVASTATIN CALCIUM 10 MG PO TABS: 10.0000 mg | ORAL_TABLET | Freq: Every day | ORAL | 0 refills | Status: DC

## 2017-10-06 NOTE — Progress Notes (Signed)
Office: [phone]  /  Fax: [phone]   HPI:   Chief Complaint: OBESITY Stacey Nichols is here to discuss her progress with her obesity treatment plan. She is on the Category 2 plan and is following her eating plan approximately 80 % of the time. She states she is exercising 0 minutes 0 times per week. Stacey Nichols is retaining fluids. She states her bowels have not been as frequent. She states she has kept up better with her protein intake.  Her weight is 237 lb (107.5 kg) today and has gained 7 pounds since her last visit. She has lost 9 lbs since starting treatment with [place].  Hyperlipidemia Stacey Nichols has hyperlipidemia and has been trying to improve her cholesterol levels with intensive lifestyle modification including a low saturated fat diet, exercise and weight loss. She denies any chest pain, claudication or myalgias.  At risk for cardiovascular disease Stacey Nichols is at a higher than average risk for cardiovascular disease due to obesity and hyperlipidemia. She currently denies any chest pain.  Constipation Stacey Nichols notes constipation for the last few weeks, worse since attempting weight loss. She states BM are less frequent and are not hard and painful. She denies hematochezia or melena. She admits to drinking less H20 recently.  ALLERGIES: Allergies  Allergen Reactions  . Latex Other (See Comments) and Itching  . Sulfa Antibiotics Rash    [Rash] rash - paper tape OK  . Tape Rash    [Rash] rash - paper tape OK    MEDICATIONS: Current Outpatient Medications on File Prior to Visit  Medication Sig Dispense Refill  . acetaminophen (TYLENOL) 500 MG tablet Take 500 mg by mouth every 6 (six) hours as needed.    . calcium carbonate (OS-CAL - DOSED IN MG OF ELEMENTAL CALCIUM) 1250 (500 Ca) MG tablet Take by mouth.    . carbamazepine (TEGRETOL XR) 200 MG 12 hr tablet Take 200 mg by mouth daily.   2  . Cyanocobalamin (VITAMIN B-12) 1000 MCG/15ML LIQD Take 500 mcg by mouth once a week.    . EPIPEN 2-PAK  0.3 MG/0.3ML SOAJ injection USE AS DIRECTED AS NEEDED  1  . fluticasone (FLONASE) 50 MCG/ACT nasal spray Place 1-2 sprays into both nostrils 3 (three) times a week.     . Iron TABS Take 1 tablet by mouth daily.    . meloxicam (MOBIC) 15 MG tablet Take 15 mg by mouth daily.    . metFORMIN (GLUCOPHAGE) 500 MG tablet Take 1 tablet (500 mg total) by mouth 2 (two) times daily with a meal. 60 tablet 0  . montelukast (SINGULAIR) 10 MG tablet Take 1 tablet (10 mg total) by mouth daily. 90 tablet 1  . Multiple Vitamins-Minerals (MULTI COMPLETE PO) Take 1 tablet by mouth daily.    . Vitamin D, Ergocalciferol, (DRISDOL) 50000 units CAPS capsule Take 1 capsule (50,000 Units total) by mouth every 7 (seven) days. 4 capsule 0   No current facility-administered medications on file prior to visit.     PAST MEDICAL HISTORY: Past Medical History:  Diagnosis Date  . AML (acute myeloblastic leukemia) ([place]) 2007   s/p chemo (Dr Lissa Merlin)  . Anemia    hx of  . Anxiety   . Arthritis    severe  . Constipation   . Depression   . GERD (gastroesophageal reflux disease)   . H/O Bell's palsy   . H/O hiatal hernia   . Headache    occasional  . High triglycerides   . History of cancer  chemotherapy   . Leukemia ([place])   . Osteopenia 06/08/2017   DEXA 2017 - T -1.8 spine  . Pneumonia    hx of   . Rheumatoid arthritis ([place])   . Sleep apnea   . Stroke ([place])    "mini stroke-caused bells palsy for 1 month"    PAST SURGICAL HISTORY: Past Surgical History:  Procedure Laterality Date  . ABDOMINAL HYSTERECTOMY  2001   heavy bleeding, precancerous cells, 1 ovary remains  . LAPAROSCOPIC GASTRIC BAND REMOVAL WITH LAPAROSCOPIC GASTRIC SLEEVE RESECTION  04/26/2014  . LAPAROSCOPIC GASTRIC BANDING  2000   performed in [place]  . TUBAL LIGATION  1996  . UPPER GI ENDOSCOPY N/A 04/26/2014   Procedure: UPPER GI ENDOSCOPY;  Surgeon: Pedro Earls, MD;  Location: [facility];  Service: General;  Laterality: N/A;    SOCIAL  HISTORY: Social History   Tobacco Use  . Smoking status: Current Some Day Smoker    Years: 30.00    Types: Cigarettes  . Smokeless tobacco: Never Used  . Tobacco comment: Once every week or 2  Substance Use Topics  . Alcohol use: Yes    Comment: occasional  . Drug use: No    FAMILY HISTORY: Family History  Problem Relation Age of Onset  . Cancer Mother        cervix  . Hyperlipidemia Father   . Obesity Father   . CAD Father        MI and arrhythmia  . Cancer Sister        metastatic, ?pancrease primary  . Diabetes Maternal Grandfather     ROS: Review of Systems  Cardiovascular: Negative for chest pain and claudication.  Gastrointestinal: Positive for constipation. Negative for melena.       Negative hematochezia  Musculoskeletal: Negative for myalgias.    PHYSICAL EXAM: Blood pressure 107/72, pulse 80, temperature 97.6 F (36.4 C), temperature source Oral, height 5\' 3"  (1.6 m), weight 237 lb (107.5 kg), SpO2 96 %. Body mass index is 41.98 kg/m. Physical Exam  Constitutional: She is oriented to person, place, and time. She appears well-developed and well-nourished.  Cardiovascular: Normal rate.  Pulmonary/Chest: Effort normal.  Musculoskeletal: Normal range of motion.  Neurological: She is oriented to person, place, and time.  Skin: Skin is warm and dry.  Psychiatric: She has a normal mood and affect. Her behavior is normal.  Vitals reviewed.   RECENT LABS AND TESTS: BMET    Component Value Date/Time   NA 144 09/04/2017 1055   NA 142 01/14/2013 1757   K 5.0 09/04/2017 1055   K 4.6 01/14/2013 1757   CL 104 09/04/2017 1055   CL 107 01/14/2013 1757   CO2 23 09/04/2017 1055   CO2 29 01/14/2013 1757   GLUCOSE 107 (H) 09/04/2017 1055   GLUCOSE 106 (H) 12/25/2016 1201   GLUCOSE 95 01/14/2013 1757   BUN 18 09/04/2017 1055   BUN 11 01/14/2013 1757   CREATININE 0.66 09/04/2017 1055   CREATININE 0.76 12/25/2016 1201   CALCIUM 9.5 09/04/2017 1055   CALCIUM  9.2 01/14/2013 1757   GFRNONAA 100 09/04/2017 1055   GFRNONAA 89 12/25/2016 1201   GFRAA 115 09/04/2017 1055   GFRAA >89 12/25/2016 1201   Lab Results  Component Value Date   HGBA1C 5.9 (H) 09/04/2017   HGBA1C 5.9 (H) 05/05/2017   HGBA1C 6.0 (H) 01/21/2017   HGBA1C 6.0 05/27/2016   HGBA1C 6.2 (H) 03/01/2014   Lab Results  Component Value Date  INSULIN 33.6 (H) 09/04/2017   INSULIN 26.0 (H) 05/05/2017   INSULIN 42.1 (H) 01/21/2017   CBC    Component Value Date/Time   WBC 5.7 09/04/2017 1055   WBC 8.3 08/27/2016 0904   RBC 4.39 09/04/2017 1055   RBC 4.48 08/27/2016 0904   HGB 14.5 09/04/2017 1055   HCT 42.8 09/04/2017 1055   PLT 166 10/28/2016   PLT 214 06/20/2015 1141   MCV 98 (H) 09/04/2017 1055   MCV 96 01/15/2013 0522   MCH 33.0 09/04/2017 1055   MCH 32.6 08/27/2016 0904   MCHC 33.9 09/04/2017 1055   MCHC 33.2 08/27/2016 0904   RDW 13.8 09/04/2017 1055   RDW 14.3 01/15/2013 0522   LYMPHSABS 2.3 09/04/2017 1055   LYMPHSABS 3.6 01/15/2013 0522   MONOABS 664 08/27/2016 0904   MONOABS 0.8 01/15/2013 0522   EOSABS 0.2 09/04/2017 1055   EOSABS 0.3 01/15/2013 0522   BASOSABS 0.0 09/04/2017 1055   BASOSABS 0.0 01/15/2013 0522   Iron/TIBC/Ferritin/ %Sat No results found for: IRON, TIBC, FERRITIN, IRONPCTSAT Lipid Panel     Component Value Date/Time   CHOL 155 09/04/2017 1055   CHOL 190 01/15/2013 0522   TRIG 179 (H) 09/04/2017 1055   TRIG 307 (H) 01/15/2013 0522   HDL 57 09/04/2017 1055   HDL 44 01/15/2013 0522   CHOLHDL 2.1 08/27/2016 0904   VLDL 21 08/27/2016 0904   VLDL 61 (H) 01/15/2013 0522   LDLCALC 62 09/04/2017 1055   LDLCALC 85 01/15/2013 0522   Hepatic Function Panel     Component Value Date/Time   PROT 7.3 09/04/2017 1055   PROT 7.7 01/14/2013 1757   ALBUMIN 4.7 09/04/2017 1055   ALBUMIN 3.8 01/14/2013 1757   AST 18 09/04/2017 1055   AST 25 01/14/2013 1757   ALT 19 09/04/2017 1055   ALT 39 01/14/2013 1757   ALKPHOS 82 09/04/2017 1055    ALKPHOS 100 01/14/2013 1757   BILITOT 0.3 09/04/2017 1055   BILITOT 0.3 01/14/2013 1757      Component Value Date/Time   TSH 3.850 01/21/2017 1128   TSH 2.09 12/25/2016 1201   TSH 3.163 03/01/2014 1201    ASSESSMENT AND PLAN: Other hyperlipidemia - Plan: rosuvastatin (CRESTOR) 10 MG tablet  Other constipation - Plan: polyethylene glycol powder (GLYCOLAX/MIRALAX) powder  At risk for heart disease  Class 3 severe obesity with serious comorbidity and body mass index (BMI) of 40.0 to 44.9 in adult, unspecified obesity type ([place])  PLAN:  Hyperlipidemia Stacey Nichols was informed of the American Heart Association Guidelines emphasizing intensive lifestyle modifications as the first line treatment for hyperlipidemia. We discussed many lifestyle modifications today in depth, and Stacey Nichols will continue to work on decreasing saturated fats such as fatty red meat, butter and many fried foods. She will also increase vegetables and lean protein in her diet and continue to work on exercise and weight loss efforts. Stacey Nichols agrees to continue taking Crestor 10 mg qd #30 and we will refill cor 1 month. Stacey Nichols agrees to follow up with our clinic in 2 weeks.  Cardiovascular risk counselling Stacey Nichols was given extended (15 minutes) coronary artery disease prevention counseling today. She is 57 y.o. female and has risk factors for heart disease including obesity and hyperlipidemia. We discussed intensive lifestyle modifications today with an emphasis on specific weight loss instructions and strategies. Pt was also informed of the importance of increasing exercise and decreasing saturated fats to help prevent heart disease.  Constipation Stacey Nichols was informed decrease bowel movement  frequency is normal while losing weight, but stools should not be hard or painful. She was advised to increase her H20 intake and work on increasing her fiber intake. High fiber foods were discussed today. Stacey Nichols agrees to start miralax 17g 1 capful  qd with no refills and she agrees to follow up with our clinic in 2 weeks.  Obesity Stacey Nichols is currently in the action stage of change. As such, her goal is to continue with weight loss efforts She has agreed to follow the Category 2 plan Stacey Nichols has been instructed to work up to a goal of 150 minutes of combined cardio and strengthening exercise per week for weight loss and overall health benefits. We discussed the following Behavioral Modification Strategies today: increasing lean protein intake, increasing fiber rich foods, and increase H20 intake   Stacey Nichols has agreed to follow up with our clinic in 2 weeks. She was informed of the importance of frequent follow up visits to maximize her success with intensive lifestyle modifications for her multiple health conditions.   OBESITY BEHAVIORAL INTERVENTION VISIT  Today's visit was # 15 out of 22.  Starting weight: 246 lbs Starting date: 01/21/17 Today's weight : 237 lbs  Today's date: 10/06/2017 Total lbs lost to date: 9 (Patients must lose 7 lbs in the first 6 months to continue with counseling)   ASK: We discussed the diagnosis of obesity with Stacey Nichols today and Stacey Nichols agreed to give [place] permission to discuss obesity behavioral modification therapy today.  ASSESS: Stacey Nichols has the diagnosis of obesity and her BMI today is 41.99 Stacey Nichols is in the action stage of change   ADVISE: Stacey Nichols was educated on the multiple health risks of obesity as well as the benefit of weight loss to improve her health. She was advised of the need for long term treatment and the importance of lifestyle modifications.  AGREE: Multiple dietary modification options and treatment options were discussed and  Stacey Nichols agreed to the above obesity treatment plan.   Stacey Nichols, am acting as transcriptionist for Lacy Duverney, PA-C I, Lacy Duverney [facility], have reviewed this note and agree with its content

## 2017-10-10 ENCOUNTER — Telehealth: Payer: Self-pay | Admitting: Family Medicine

## 2017-10-10 NOTE — Telephone Encounter (Signed)
Dr. Darnell Level, do you have this paperwork?

## 2017-10-10 NOTE — Telephone Encounter (Signed)
Copied from [place] [id number]. Topic: General - Other >> Oct 10, 2017  9:33 AM Boyd Kerbs wrote: Reason for CRM:   [place]  [phone]  ext [phone]    Pt. Is having knee replacement on April 25th and is faxing again today  pre-op paperwork for Dr. Darnell Level.  Needing paperwork by 4-10 if possible

## 2017-10-10 NOTE — Telephone Encounter (Signed)
I do. I thought she had preop appt with [place] on 4/10 - but looks like she does not.  plz schedule preop visit for clearance.

## 2017-10-13 HISTORY — PX: REPLACEMENT TOTAL KNEE: SUR1224

## 2017-10-14 NOTE — Telephone Encounter (Signed)
Spoke with pt and scheduled her for 9:00 AM on 10/21/17.

## 2017-10-21 ENCOUNTER — Ambulatory Visit: Payer: BLUE CROSS/BLUE SHIELD | Admitting: Family Medicine

## 2017-10-21 ENCOUNTER — Ambulatory Visit (INDEPENDENT_AMBULATORY_CARE_PROVIDER_SITE_OTHER)
Admission: RE | Admit: 2017-10-21 | Discharge: 2017-10-21 | Disposition: A | Discharge status: Home / Self Care | Payer: BLUE CROSS/BLUE SHIELD | Source: Ambulatory Visit | Attending: Family Medicine | Admitting: Family Medicine

## 2017-10-21 ENCOUNTER — Other Ambulatory Visit: Payer: BLUE CROSS/BLUE SHIELD

## 2017-10-21 ENCOUNTER — Encounter: Payer: Self-pay | Admitting: Family Medicine

## 2017-10-21 ENCOUNTER — Ambulatory Visit (INDEPENDENT_AMBULATORY_CARE_PROVIDER_SITE_OTHER): Payer: BLUE CROSS/BLUE SHIELD | Admitting: Physician Assistant

## 2017-10-21 VITALS — BP 122/74 | HR 81 | Temp 98.2°F | Wt 238.0 lb

## 2017-10-21 VITALS — BP 101/70 | HR 75 | Temp 97.8°F | Ht 63.0 in | Wt 234.0 lb

## 2017-10-21 DIAGNOSIS — E559 Vitamin D deficiency, unspecified: Secondary | ICD-10-CM

## 2017-10-21 DIAGNOSIS — Z6841 Body Mass Index (BMI) 40.0 and over, adult: Secondary | ICD-10-CM

## 2017-10-21 DIAGNOSIS — R7303 Prediabetes: Secondary | ICD-10-CM | POA: Diagnosis not present

## 2017-10-21 DIAGNOSIS — Z01818 Encounter for other preprocedural examination: Secondary | ICD-10-CM | POA: Insufficient documentation

## 2017-10-21 DIAGNOSIS — G4733 Obstructive sleep apnea (adult) (pediatric): Secondary | ICD-10-CM | POA: Diagnosis not present

## 2017-10-21 DIAGNOSIS — M170 Bilateral primary osteoarthritis of knee: Secondary | ICD-10-CM | POA: Diagnosis not present

## 2017-10-21 DIAGNOSIS — Z9884 Bariatric surgery status: Secondary | ICD-10-CM | POA: Diagnosis not present

## 2017-10-21 DIAGNOSIS — C9201 Acute myeloblastic leukemia, in remission: Secondary | ICD-10-CM

## 2017-10-21 DIAGNOSIS — Z9189 Other specified personal risk factors, not elsewhere classified: Secondary | ICD-10-CM

## 2017-10-21 DIAGNOSIS — E785 Hyperlipidemia, unspecified: Secondary | ICD-10-CM | POA: Diagnosis not present

## 2017-10-21 LAB — CBC WITH DIFFERENTIAL/PLATELET
BASOS ABS: 0 10*3/uL (ref 0.0–0.1)
BASOS PCT: 0.5 % (ref 0.0–3.0)
EOS ABS: 0.2 10*3/uL (ref 0.0–0.7)
Eosinophils Relative: 3.2 % (ref 0.0–5.0)
HEMATOCRIT: 41.8 % (ref 36.0–46.0)
HEMOGLOBIN: 14.3 g/dL (ref 12.0–15.0)
LYMPHS PCT: 42.9 % (ref 12.0–46.0)
Lymphs Abs: 2.5 10*3/uL (ref 0.7–4.0)
MCHC: 34.2 g/dL (ref 30.0–36.0)
MCV: 97.9 fl (ref 78.0–100.0)
Monocytes Absolute: 0.6 10*3/uL (ref 0.1–1.0)
Monocytes Relative: 10.3 % (ref 3.0–12.0)
Neutro Abs: 2.5 10*3/uL (ref 1.4–7.7)
Neutrophils Relative %: 43.1 % (ref 43.0–77.0)
Platelets: 191 10*3/uL (ref 150.0–400.0)
RBC: 4.27 Mil/uL (ref 3.87–5.11)
RDW: 13.7 % (ref 11.5–15.5)
WBC: 5.7 10*3/uL (ref 4.0–10.5)

## 2017-10-21 LAB — POC URINALSYSI DIPSTICK (AUTOMATED)
GLUCOSE UA: NEGATIVE
Ketones, UA: NEGATIVE
NITRITE UA: NEGATIVE
RBC UA: NEGATIVE
Spec Grav, UA: >=1.03 — AB (ref 1.010–1.025)
Urobilinogen, UA: 0.2 E.U./dL
pH, UA: 5.5 (ref 5.0–8.0)

## 2017-10-21 LAB — BASIC METABOLIC PANEL
BUN: 25 mg/dL — AB (ref 6–23)
CHLORIDE: 101 meq/L (ref 96–112)
CO2: 29 meq/L (ref 19–32)
Calcium: 9.3 mg/dL (ref 8.4–10.5)
Creatinine, Ser: 0.74 mg/dL (ref 0.40–1.20)
GFR: 86.36 mL/min (ref >60.00)
GLUCOSE: 88 mg/dL (ref 70–99)
Potassium: 3.8 mEq/L (ref 3.5–5.1)
Sodium: 138 mEq/L (ref 135–145)

## 2017-10-21 LAB — PROTIME-INR
INR: 1 ratio (ref 0.8–1.0)
Prothrombin Time: 10.5 s (ref 9.6–13.1)

## 2017-10-21 LAB — APTT: aPTT: 29 s (ref 23.4–32.7)

## 2017-10-21 MED ORDER — CALCIUM 600 MG PO TABS: 600.0000 mg | ORAL_TABLET | Freq: Two times a day (BID) | ORAL | 3 refills | Status: DC

## 2017-10-21 MED ORDER — METFORMIN HCL 500 MG PO TABS: 500.0000 mg | ORAL_TABLET | Freq: Two times a day (BID) | ORAL | 0 refills | Status: DC

## 2017-10-21 NOTE — Patient Instructions (Addendum)
Electrocardiograma hoy, radiografia toracica hoy, laboratorios hoy.  Mandaremos resultados a ortopeda.  Buena suerte con proxima cirugia!

## 2017-10-21 NOTE — Progress Notes (Signed)
Office: [phone]  /  Fax: [phone]   HPI:   Chief Complaint: OBESITY Stacey Nichols is here to discuss her progress with her obesity treatment plan. She is on the Category 2 plan and is following her eating plan approximately 80 % of the time. She states she is exercising 0 minutes 0 times per week. Stacey Nichols continues to do well with weight loss. She is incorporating high protein and states hunger is well controlled.  Her weight is 234 lb (106.1 kg) today and has had a weight loss of 3 pounds over a period of 2 weeks since her last visit. She has lost 12 lbs since starting treatment with [place].  Pre-Diabetes Stacey Nichols has a diagnosis of pre-diabetes based on her elevated Hgb A1c and was informed this puts her at greater risk of developing diabetes. She is taking metformin currently and continues to work on diet and exercise to decrease risk of diabetes. She denies polyphagia, nausea or hypoglycemia.  At risk for diabetes Stacey Nichols is at higher than average risk for developing diabetes due to her obesity and pre-diabetes. She currently denies polyuria or polydipsia.  Vitamin D Deficiency Stacey Nichols has a diagnosis of vitamin D deficiency. She is currently taking prescription Vit D and denies nausea, vomiting or muscle weakness.  ALLERGIES: Allergies  Allergen Reactions  . Latex Other (See Comments) and Itching  . Sulfa Antibiotics Rash    [Rash] rash - paper tape OK  . Tape Rash    [Rash] rash - paper tape OK    MEDICATIONS: Current Outpatient Medications on File Prior to Visit  Medication Sig Dispense Refill  . acetaminophen (TYLENOL) 500 MG tablet Take 500 mg by mouth every 6 (six) hours as needed.    . carbamazepine (TEGRETOL XR) 200 MG 12 hr tablet Take 200 mg by mouth daily.   2  . Cyanocobalamin (VITAMIN B-12) 1000 MCG/15ML LIQD Take 500 mcg by mouth once a week.    . EPIPEN 2-PAK 0.3 MG/0.3ML SOAJ injection USE AS DIRECTED AS NEEDED  1  . fluticasone (FLONASE) 50 MCG/ACT nasal spray Place  1-2 sprays into both nostrils 3 (three) times a week.     . Iron TABS Take 1 tablet by mouth daily.    . meloxicam (MOBIC) 15 MG tablet Take 15 mg by mouth daily.    . montelukast (SINGULAIR) 10 MG tablet Take 1 tablet (10 mg total) by mouth daily. 90 tablet 1  . Multiple Vitamins-Minerals (MULTI COMPLETE PO) Take 1 tablet by mouth daily.    . polyethylene glycol powder (GLYCOLAX/MIRALAX) powder Take 17 g by mouth daily. 3350 g 1  . rosuvastatin (CRESTOR) 10 MG tablet Take 1 tablet (10 mg total) by mouth at bedtime. 30 tablet 0  . Vitamin D, Ergocalciferol, (DRISDOL) 50000 units CAPS capsule Take 1 capsule (50,000 Units total) by mouth every 7 (seven) days. 4 capsule 0   No current facility-administered medications on file prior to visit.     PAST MEDICAL HISTORY: Past Medical History:  Diagnosis Date  . 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 Anemia    hx of  . Anxiety   . Arthritis    severe  . Constipation   . Depression   . GERD (gastroesophageal reflux disease)   . H/O Bell's palsy   . H/O hiatal hernia   . Headache    occasional  . High triglycerides   . History of cancer chemotherapy   . Leukemia ([place])   .  Osteopenia 06/08/2017   DEXA 2017 - T -1.8 spine  . Pneumonia    hx of   . Rheumatoid arthritis ([place])   . Sleep apnea   . Stroke ([place])    "mini stroke-caused bells palsy for 1 month"    PAST SURGICAL HISTORY: Past Surgical History:  Procedure Laterality Date  . ABDOMINAL HYSTERECTOMY  2001   heavy bleeding, precancerous cells, 1 ovary remains  . LAPAROSCOPIC GASTRIC BAND REMOVAL WITH LAPAROSCOPIC GASTRIC SLEEVE RESECTION  04/26/2014  . LAPAROSCOPIC GASTRIC BANDING  2000   performed in [place]  . TUBAL LIGATION  1996  . UPPER GI ENDOSCOPY N/A 04/26/2014   Procedure: UPPER GI ENDOSCOPY;  Surgeon: Pedro Earls, MD;  Location: [facility];  Service: General;  Laterality: N/A;    SOCIAL HISTORY: Social History   Tobacco Use    . Smoking status: Current Some Day Smoker    Years: 30.00    Types: Cigarettes  . Smokeless tobacco: Never Used  . Tobacco comment: Once every week or 2  Substance Use Topics  . Alcohol use: Yes    Comment: occasional  . Drug use: No    FAMILY HISTORY: Family History  Problem Relation Age of Onset  . Cancer Mother        cervix  . Hyperlipidemia Father   . Obesity Father   . CAD Father        MI and arrhythmia  . Cancer Sister        metastatic, ?pancrease primary  . Diabetes Maternal Grandfather     ROS: Review of Systems  Constitutional: Positive for weight loss.  Gastrointestinal: Negative for nausea and vomiting.  Genitourinary: Negative for frequency.  Musculoskeletal:       Negative muscle weakness  Endo/Heme/Allergies: Negative for polydipsia.       Negative polyphagia Negative hypoglycemia    PHYSICAL EXAM: Blood pressure 101/70, pulse 75, temperature 97.8 F (36.6 C), temperature source Oral, height 5\' 3"  (1.6 m), weight 234 lb (106.1 kg), SpO2 98 %. Body mass index is 41.45 kg/m. Physical Exam  Constitutional: She is oriented to person, place, and time. She appears well-developed and well-nourished.  Cardiovascular: Normal rate.  Pulmonary/Chest: Effort normal.  Musculoskeletal: Normal range of motion.  Neurological: She is oriented to person, place, and time.  Skin: Skin is warm and dry.  Psychiatric: She has a normal mood and affect. Her behavior is normal.  Vitals reviewed.   RECENT LABS AND TESTS: BMET    Component Value Date/Time   NA 144 09/04/2017 1055   NA 142 01/14/2013 1757   K 5.0 09/04/2017 1055   K 4.6 01/14/2013 1757   CL 104 09/04/2017 1055   CL 107 01/14/2013 1757   CO2 23 09/04/2017 1055   CO2 29 01/14/2013 1757   GLUCOSE 107 (H) 09/04/2017 1055   GLUCOSE 106 (H) 12/25/2016 1201   GLUCOSE 95 01/14/2013 1757   BUN 18 09/04/2017 1055   BUN 11 01/14/2013 1757   CREATININE 0.66 09/04/2017 1055   CREATININE 0.76 12/25/2016  1201   CALCIUM 9.5 09/04/2017 1055   CALCIUM 9.2 01/14/2013 1757   GFRNONAA 100 09/04/2017 1055   GFRNONAA 89 12/25/2016 1201   GFRAA 115 09/04/2017 1055   GFRAA >89 12/25/2016 1201   Lab Results  Component Value Date   HGBA1C 5.9 (H) 09/04/2017   HGBA1C 5.9 (H) 05/05/2017   HGBA1C 6.0 (H) 01/21/2017   HGBA1C 6.0 05/27/2016   HGBA1C 6.2 (H) 03/01/2014   Lab  Results  Component Value Date   INSULIN 33.6 (H) 09/04/2017   INSULIN 26.0 (H) 05/05/2017   INSULIN 42.1 (H) 01/21/2017   CBC    Component Value Date/Time   WBC 5.7 09/04/2017 1055   WBC 8.3 08/27/2016 0904   RBC 4.39 09/04/2017 1055   RBC 4.48 08/27/2016 0904   HGB 14.5 09/04/2017 1055   HCT 42.8 09/04/2017 1055   PLT 166 10/28/2016   PLT 214 06/20/2015 1141   MCV 98 (H) 09/04/2017 1055   MCV 96 01/15/2013 0522   MCH 33.0 09/04/2017 1055   MCH 32.6 08/27/2016 0904   MCHC 33.9 09/04/2017 1055   MCHC 33.2 08/27/2016 0904   RDW 13.8 09/04/2017 1055   RDW 14.3 01/15/2013 0522   LYMPHSABS 2.3 09/04/2017 1055   LYMPHSABS 3.6 01/15/2013 0522   MONOABS 664 08/27/2016 0904   MONOABS 0.8 01/15/2013 0522   EOSABS 0.2 09/04/2017 1055   EOSABS 0.3 01/15/2013 0522   BASOSABS 0.0 09/04/2017 1055   BASOSABS 0.0 01/15/2013 0522   Iron/TIBC/Ferritin/ %Sat No results found for: IRON, TIBC, FERRITIN, IRONPCTSAT Lipid Panel     Component Value Date/Time   CHOL 155 09/04/2017 1055   CHOL 190 01/15/2013 0522   TRIG 179 (H) 09/04/2017 1055   TRIG 307 (H) 01/15/2013 0522   HDL 57 09/04/2017 1055   HDL 44 01/15/2013 0522   CHOLHDL 2.1 08/27/2016 0904   VLDL 21 08/27/2016 0904   VLDL 61 (H) 01/15/2013 0522   LDLCALC 62 09/04/2017 1055   LDLCALC 85 01/15/2013 0522   Hepatic Function Panel     Component Value Date/Time   PROT 7.3 09/04/2017 1055   PROT 7.7 01/14/2013 1757   ALBUMIN 4.7 09/04/2017 1055   ALBUMIN 3.8 01/14/2013 1757   AST 18 09/04/2017 1055   AST 25 01/14/2013 1757   ALT 19 09/04/2017 1055   ALT 39  01/14/2013 1757   ALKPHOS 82 09/04/2017 1055   ALKPHOS 100 01/14/2013 1757   BILITOT 0.3 09/04/2017 1055   BILITOT 0.3 01/14/2013 1757      Component Value Date/Time   TSH 3.850 01/21/2017 1128   TSH 2.09 12/25/2016 1201   TSH 3.163 03/01/2014 1201  Results for SHANTEL, HELWIG (MRN [id number]) as of 10/21/2017 16:46  Ref. Range 09/04/2017 10:55  Vitamin D, 25-Hydroxy Latest Ref Range: 30.0 - 100.0 ng/mL 42.0    ASSESSMENT AND PLAN: Prediabetes - Plan: metFORMIN (GLUCOPHAGE) 500 MG tablet  Vitamin D deficiency  At risk for diabetes mellitus  Class 3 severe obesity with serious comorbidity and body mass index (BMI) of 40.0 to 44.9 in adult, unspecified obesity type [facility])  PLAN:  Pre-Diabetes Stacey Nichols will continue to work on weight loss, exercise, and decreasing simple carbohydrates in her diet to help decrease the risk of diabetes. We dicussed metformin including benefits and risks. She was informed that eating too many simple carbohydrates or too many calories at one sitting increases the likelihood of GI side effects. Stacey Nichols agrees to continue taking metformin 500 mg BID #60 and we will refill for 1 month. Stacey Nichols agrees to follow up with our clinic in 2 weeks as directed to monitor her progress.  Diabetes risk counselling Stacey Nichols was given extended (15 minutes) diabetes prevention counseling today. She is 57 y.o. female and has risk factors for diabetes including obesity and pre-diabetes. We discussed intensive lifestyle modifications today with an emphasis on weight loss as well as increasing exercise and decreasing simple carbohydrates in her diet.  Vitamin D  Deficiency Amnah was informed that low vitamin D levels contributes to fatigue and are associated with obesity, breast, and colon cancer. Stacey Nichols agrees to continue taking prescription Vit D @50 ,000 IU every week #4 and will follow up for routine testing of vitamin D, at least 2-3 times per year. She was informed of the risk of  over-replacement of vitamin D and agrees to not increase her dose unless she discusses this with [place] first. Stacey Nichols agrees to follow up with our clinic in 2 weeks.  Obesity Stacey Nichols is currently in the action stage of change. As such, her goal is to continue with weight loss efforts She has agreed to follow the Category 2 plan Stacey Nichols has been instructed to work up to a goal of 150 minutes of combined cardio and strengthening exercise per week for weight loss and overall health benefits. We discussed the following Behavioral Modification Strategies today: increasing lean protein intake and work on meal planning and easy cooking plans   Stacey Nichols has agreed to follow up with our clinic in 2 weeks. She was informed of the importance of frequent follow up visits to maximize her success with intensive lifestyle modifications for her multiple health conditions.   OBESITY BEHAVIORAL INTERVENTION VISIT  Today's visit was # 16 out of 22.  Starting weight: 246 lbs Starting date: 01/21/17 Today's weight : 234 lbs Today's date: 10/21/2017 Total lbs lost to date: 12 (Patients must lose 7 lbs in the first 6 months to continue with counseling)   ASK: We discussed the diagnosis of obesity with Windell Moment today and Cristiana agreed to give [place] permission to discuss obesity behavioral modification therapy today.  ASSESS: Angelo has the diagnosis of obesity and her BMI today is 41.46 Haruka is in the action stage of change   ADVISE: Darnetta was educated on the multiple health risks of obesity as well as the benefit of weight loss to improve her health. She was advised of the need for long term treatment and the importance of lifestyle modifications.  AGREE: Multiple dietary modification options and treatment options were discussed and  Stacey Nichols agreed to the above obesity treatment plan.   Stacey Nichols, am acting as transcriptionist for Lacy Duverney, PA-C I, Lacy Duverney [facility], have reviewed this note and agree with  its content

## 2017-10-21 NOTE — Progress Notes (Signed)
BP 122/74 (BP Location: Left Arm, Patient Position: Sitting, Cuff Size: Large)   Pulse 81   Temp 98.2 F (36.8 C) (Oral)   Wt 238 lb (108 kg)   SpO2 93%   BMI 42.16 kg/m    CC: preop exam Subjective:    Patient ID: Stacey Nichols, female    DOB: 1961/03/20, 57 y.o.   MRN: [id number]  HPI: Stacey Nichols is a 57 y.o. female presenting on 10/21/2017 for Pre-op Exam   Upcoming L knee replacement scheduled for 11/06/2017 by Dr Francia Greaves at [facility]. Activity markedly limited by knee pain. They request urinalysis, labs and EKG.   Denies recent chest pain, tightness, dypsnea. Denies urinary symptoms.   She has tolerated GETA well in the past, s/p hysterectomy 2001 and gastric sleeve 04/2014. She has had some trouble awakening after anesthesia. No post-op nausea/vomiting.   Last cardiac eval 07/2015 with coronary calcium score of 0.   On crestor and metformin through bariatric clinic.   Relevant past medical, surgical, family and social history reviewed and updated as indicated. Interim medical history since our last visit reviewed. Allergies and medications reviewed and updated. Outpatient Medications Prior to Visit  Medication Sig Dispense Refill  . acetaminophen (TYLENOL) 500 MG tablet Take 500 mg by mouth every 6 (six) hours as needed.    . carbamazepine (TEGRETOL XR) 200 MG 12 hr tablet Take 200 mg by mouth daily.   2  . Cyanocobalamin (VITAMIN B-12) 1000 MCG/15ML LIQD Take 500 mcg by mouth once a week.    . EPIPEN 2-PAK 0.3 MG/0.3ML SOAJ injection USE AS DIRECTED AS NEEDED  1  . fluticasone (FLONASE) 50 MCG/ACT nasal spray Place 1-2 sprays into both nostrils 3 (three) times a week.     . Iron TABS Take 1 tablet by mouth daily.    . meloxicam (MOBIC) 15 MG tablet Take 15 mg by mouth daily.    . montelukast (SINGULAIR) 10 MG tablet Take 1 tablet (10 mg total) by mouth daily. 90 tablet 1  . Multiple Vitamins-Minerals (MULTI COMPLETE PO) Take 1 tablet by mouth daily.    .  polyethylene glycol powder (GLYCOLAX/MIRALAX) powder Take 17 g by mouth daily. 3350 g 1  . rosuvastatin (CRESTOR) 10 MG tablet Take 1 tablet (10 mg total) by mouth at bedtime. 30 tablet 0  . Vitamin D, Ergocalciferol, (DRISDOL) 50000 units CAPS capsule Take 1 capsule (50,000 Units total) by mouth every 7 (seven) days. 4 capsule 0  . calcium carbonate (OS-CAL - DOSED IN MG OF ELEMENTAL CALCIUM) 1250 (500 Ca) MG tablet Take by mouth.    . metFORMIN (GLUCOPHAGE) 500 MG tablet Take 1 tablet (500 mg total) by mouth 2 (two) times daily with a meal. 60 tablet 0   No facility-administered medications prior to visit.     Past Medical History:  Diagnosis Date  . 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 Anemia    hx of  . Anxiety   . Arthritis    severe  . Constipation   . Depression   . GERD (gastroesophageal reflux disease)   . H/O Bell's palsy   . H/O hiatal hernia   . Headache    occasional  . High triglycerides   . History of cancer chemotherapy   . Leukemia ([place])   . Osteopenia 06/08/2017   DEXA 2017 - T -1.8 spine  . Pneumonia    hx of   . Rheumatoid  arthritis ([place])   . Sleep apnea   . Stroke [facility])    "mini stroke-caused bells palsy for 1 month"    Past Surgical History:  Procedure Laterality Date  . ABDOMINAL HYSTERECTOMY  2001   heavy bleeding, precancerous cells, 1 ovary remains  . LAPAROSCOPIC GASTRIC BAND REMOVAL WITH LAPAROSCOPIC GASTRIC SLEEVE RESECTION  04/26/2014  . LAPAROSCOPIC GASTRIC BANDING  2000   performed in [place]  . TUBAL LIGATION  1996  . UPPER GI ENDOSCOPY N/A 04/26/2014   Procedure: UPPER GI ENDOSCOPY;  Surgeon: Pedro Earls, MD;  Location: [facility];  Service: General;  Laterality: N/A;   Social History   Tobacco Use  . Smoking status: Current Some Day Smoker    Years: 30.00    Types: Cigarettes  . Smokeless tobacco: Never Used  . Tobacco comment: Once every week or 2  Substance Use Topics  . Alcohol use: Yes     Comment: occasional  . Drug use: No    Family History  Problem Relation Age of Onset  . Cancer Mother        cervix  . Hyperlipidemia Father   . Obesity Father   . CAD Father        MI and arrhythmia  . Cancer Sister        metastatic, ?pancrease primary  . Diabetes Maternal Grandfather     Per HPI unless specifically indicated in ROS section below Review of Systems     Objective:    BP 122/74 (BP Location: Left Arm, Patient Position: Sitting, Cuff Size: Large)   Pulse 81   Temp 98.2 F (36.8 C) (Oral)   Wt 238 lb (108 kg)   SpO2 93%   BMI 42.16 kg/m   Wt Readings from Last 3 Encounters:  10/21/17 234 lb (106.1 kg)  10/21/17 238 lb (108 kg)  10/06/17 237 lb (107.5 kg)    Physical Exam  Constitutional: She appears well-developed and well-nourished. No distress.  HENT:  Head: Normocephalic and atraumatic.  Mouth/Throat: Oropharynx is clear and moist. No oropharyngeal exudate.  Eyes: Pupils are equal, round, and reactive to light. Conjunctivae are normal.  Neck: No thyromegaly present.  Cardiovascular: Normal rate, regular rhythm, normal heart sounds and intact distal pulses.  No murmur heard. Pulmonary/Chest: Effort normal and breath sounds normal. No respiratory distress. She has no wheezes. She has no rales.  Musculoskeletal: She exhibits no edema.  Skin: Skin is warm and dry. No rash noted.  Psychiatric: She has a normal mood and affect.  Nursing note and vitals reviewed.  Lab Results  Component Value Date   HGBA1C 5.9 (H) 09/04/2017    Results for orders placed or performed in visit on 10/21/17  CBC with Differential/Platelet  Result Value Ref Range   WBC 5.7 4.0 - 10.5 K/uL   RBC 4.27 3.87 - 5.11 Mil/uL   Hemoglobin 14.3 12.0 - 15.0 g/dL   HCT 41.8 36.0 - 46.0 %   MCV 97.9 78.0 - 100.0 fl   MCHC 34.2 30.0 - 36.0 g/dL   RDW 13.7 11.5 - 15.5 %   Platelets 191.0 150.0 - 400.0 K/uL   Neutrophils Relative % 43.1 43.0 - 77.0 %   Lymphocytes Relative 42.9  12.0 - 46.0 %   Monocytes Relative 10.3 3.0 - 12.0 %   Eosinophils Relative 3.2 0.0 - 5.0 %   Basophils Relative 0.5 0.0 - 3.0 %   Neutro Abs 2.5 1.4 - 7.7 K/uL   Lymphs Abs 2.5 0.7 -  4.0 K/uL   Monocytes Absolute 0.6 0.1 - 1.0 K/uL   Eosinophils Absolute 0.2 0.0 - 0.7 K/uL   Basophils Absolute 0.0 0.0 - 0.1 K/uL  Basic metabolic panel  Result Value Ref Range   Sodium 138 135 - 145 mEq/L   Potassium 3.8 3.5 - 5.1 mEq/L   Chloride 101 96 - 112 mEq/L   CO2 29 19 - 32 mEq/L   Glucose, Bld 88 70 - 99 mg/dL   BUN 25 (H) 6 - 23 mg/dL   Creatinine, Ser 0.74 0.40 - 1.20 mg/dL   Calcium 9.3 8.4 - 10.5 mg/dL   GFR 86.36 >60.00 mL/min  Protime-INR  Result Value Ref Range   INR 1.0 0.8 - 1.0 ratio   Prothrombin Time 10.5 9.6 - 13.1 sec  APTT  Result Value Ref Range   aPTT 29.0 23.4 - 32.7 SEC  POCT Urinalysis Dipstick (Automated)  Result Value Ref Range   Color, UA yellow    Clarity, UA clear    Glucose, UA negative    Bilirubin, UA 1+    Ketones, UA negative    Spec Grav, UA >=1.030 (A) 1.010 - 1.025   Blood, UA negative    pH, UA 5.5 5.0 - 8.0   Protein, UA trace    Urobilinogen, UA 0.2 0.2 or 1.0 E.U./dL   Nitrite, UA negative    Leukocytes, UA Small (1+) (A) Negative  Micro: WBC 3-5 RBC 0-2 Bact 1+ Casts none Epi TNTC UCx not sent - likely contaminated specimen given amt epithelial cells. Pt asxs.   EKG - NSR rate 90, normal axis, intervals, no acute ST/T changes.     Assessment & Plan:   Problem List Items Addressed This Visit    AML (acute myeloid leukemia) in remission ([place])    Diagnosed in [place], 10 yrs cancer free. Released from onc care last year - Dr Phill Myron in [place]       Body mass index (BMI) of 40.0 to 44.9 in adult [facility])    Followed by [facility] weight management clinic, on metformin through them.       Dyslipidemia    Chronic, stable on crestor 10mg  daily. She had calcium score of zero 07/2015 but decided to treat aggressively with statin.        OSA (obstructive sleep apnea)    Carries this dx, not on CPAP.       Prediabetes   Preop examination - Primary    RCRI = 0.  Upcoming L knee replacement then likely R side. Patient has tolerated GETA well in the past but has had some difficulty with awakening - latest surgery did better. Will check labwork requested. UA with LE but micro showing likely contaminant. Pt asxs - no further treatment indicated for this. EKG reassuring. Anticipate adequately low risk to proceed with planned surgery. Will forward office visit and testing to ortho.       Relevant Orders   EKG 12-Lead (Completed)   CBC with Differential/Platelet (Completed)   Basic metabolic panel (Completed)   Protime-INR (Completed)   APTT (Completed)   DG Chest 2 View (Completed)   POCT Urinalysis Dipstick (Automated) (Completed)   Primary osteoarthritis of both knees    Planned upcoming surgery.       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 calcium carbonate (OS-CAL) 600 MG tablet    Sig: Take 1 tablet (600 mg total)  by mouth 2 (two) times daily with a meal.    Dispense:  90 tablet    Refill:  3   Orders Placed This Encounter  Procedures  . DG Chest 2 View    Standing Status:   Future    Number of Occurrences:   1    Standing Expiration Date:   12/22/2018    Order Specific Question:   Reason for Exam (SYMPTOM  OR DIAGNOSIS REQUIRED)    Answer:   preop eval    Order Specific Question:   Is patient pregnant?    Answer:   No    Order Specific Question:   Preferred imaging location?    Answer:   [facility]    Order Specific Question:   Radiology Contrast Protocol - do NOT remove file path    Answer:   [email or web address]  . CBC with Differential/Platelet  . Basic metabolic panel  . Protime-INR  . APTT  . POCT Urinalysis Dipstick (Automated)  . EKG 12-Lead    Follow up plan: Return if symptoms  worsen or fail to improve.  Ria Bush, MD

## 2017-10-22 ENCOUNTER — Telehealth: Payer: Self-pay | Admitting: Family Medicine

## 2017-10-22 NOTE — Telephone Encounter (Signed)
Jamie with ortho cb and was advised as instructed and voiced understanding and will let ortho dr know.

## 2017-10-22 NOTE — Assessment & Plan Note (Signed)
Carries this dx, not on CPAP.

## 2017-10-22 NOTE — Telephone Encounter (Signed)
UA was contaminated as evidenced by large epithelial cells, without significant amount of WBC. I don't think she had a UTI. Urine culture was not sent.

## 2017-10-22 NOTE — Telephone Encounter (Signed)
Copied from [place] [id number]. Topic: Inquiry >> Oct 22, 2017  2:12 PM Lennox Solders wrote: Reason for CRM: Roselyn Reef from [facility] is calling to see if dr g will treat patient for uti before or after culture comes back. Pt has an appt today at 3 pm. Pt will be will having surgery .

## 2017-10-22 NOTE — Assessment & Plan Note (Addendum)
RCRI = 0.  Upcoming L knee replacement then likely R side. Patient has tolerated GETA well in the past but has had some difficulty with awakening - latest surgery did better. Will check labwork requested. UA with LE but micro showing likely contaminant. Pt asxs - no further treatment indicated for this. EKG reassuring. Anticipate adequately low risk to proceed with planned surgery. Will forward office visit and testing to ortho.

## 2017-10-22 NOTE — Assessment & Plan Note (Signed)
Diagnosed in [place], 10 yrs cancer free. Released from onc care last year - Dr Phill Myron in [place]

## 2017-10-22 NOTE — Assessment & Plan Note (Signed)
Planned upcoming surgery.

## 2017-10-22 NOTE — Assessment & Plan Note (Signed)
Followed by [facility] weight management clinic, on metformin through them.

## 2017-10-22 NOTE — Assessment & Plan Note (Addendum)
Chronic, stable on crestor 10mg  daily. She had calcium score of zero 07/2015 but decided to treat aggressively with statin.

## 2017-10-22 NOTE — Telephone Encounter (Signed)
Pt was seen 10/21/17 with U/A.Please advise.

## 2017-10-29 LAB — HM MAMMOGRAPHY

## 2017-11-04 ENCOUNTER — Ambulatory Visit (INDEPENDENT_AMBULATORY_CARE_PROVIDER_SITE_OTHER): Payer: BLUE CROSS/BLUE SHIELD | Admitting: Physician Assistant

## 2017-11-04 VITALS — BP 112/76 | HR 68 | Temp 97.9°F | Ht 63.0 in | Wt 236.0 lb

## 2017-11-04 DIAGNOSIS — F3289 Other specified depressive episodes: Secondary | ICD-10-CM | POA: Diagnosis not present

## 2017-11-04 DIAGNOSIS — E559 Vitamin D deficiency, unspecified: Secondary | ICD-10-CM | POA: Diagnosis not present

## 2017-11-04 DIAGNOSIS — Z6841 Body Mass Index (BMI) 40.0 and over, adult: Secondary | ICD-10-CM | POA: Diagnosis not present

## 2017-11-04 DIAGNOSIS — R7303 Prediabetes: Secondary | ICD-10-CM | POA: Diagnosis not present

## 2017-11-04 DIAGNOSIS — Z9189 Other specified personal risk factors, not elsewhere classified: Secondary | ICD-10-CM | POA: Diagnosis not present

## 2017-11-04 MED ORDER — METFORMIN HCL 500 MG PO TABS: 500.0000 mg | ORAL_TABLET | Freq: Two times a day (BID) | ORAL | 0 refills | Status: DC

## 2017-11-04 MED ORDER — VITAMIN D (ERGOCALCIFEROL) 1.25 MG (50000 UNIT) PO CAPS: 50000.0000 [IU] | ORAL_CAPSULE | ORAL | 0 refills | Status: DC

## 2017-11-04 MED ORDER — BUPROPION HCL ER (SR) 150 MG PO TB12: 150.0000 mg | ORAL_TABLET | Freq: Every day | ORAL | 0 refills | Status: DC

## 2017-11-05 ENCOUNTER — Telehealth: Payer: Self-pay | Admitting: Family Medicine

## 2017-11-05 NOTE — Telephone Encounter (Signed)
Copied from [place] [id number]. Topic: Quick Communication - See Telephone Encounter >> Nov 05, 2017  3:36 PM Ivar Drape wrote: CRM for notification. See Telephone encounter for: 11/05/17. Roselyn Reef an LPN @ [facility] [phone] ex [phone] would like Labs - CBC, INR, Metabolic Panel faxed to them ASAP. Fax - [phone].  The patient will be there tomorrow, so they need these labs quickly.

## 2017-11-05 NOTE — Telephone Encounter (Signed)
10/21/17 labs faxed to [phone] as requested for continuity of care. Left v/m for Roselyn Reef at [facility] ortho fax was done and completed.

## 2017-11-05 NOTE — Progress Notes (Signed)
Office: [phone]  /  Fax: [phone]   HPI:   Chief Complaint: OBESITY Stacey Nichols is here to discuss her progress with her obesity treatment plan. She is on the Category 2 plan and is following her eating plan approximately 80 % of the time. She states she is exercising 0 minutes 0 times per week. Stacey Nichols continues to do well with weight loss. She has noticed an increase in cravings as she is anticipating knee surgery in two days. Her weight is 236 lb (107 kg) today and has had a weight gain of 2 pounds over a period of 2 weeks since her last visit. She has lost 10 lbs since starting treatment with [place].  Vitamin D deficiency Stacey Nichols has a diagnosis of vitamin D deficiency. She is currently taking vit D and denies nausea, vomiting or muscle weakness.  Pre-Diabetes Stacey Nichols has a diagnosis of pre-diabetes based on her elevated Hgb A1c and was informed this puts her at greater risk of developing diabetes. She is taking metformin currently and continues to work on diet and exercise to decrease risk of diabetes. She denies nausea, polyphagia or hypoglycemia.  At risk for diabetes Stacey Nichols is at higher than average risk for developing diabetes due to her obesity and pre-diabetes. She currently denies polyuria or polydipsia.  Depression with emotional eating behaviors Stacey Nichols is struggling with emotional eating and using food for comfort to the extent that it is negatively impacting her health. She often snacks when she is not hungry. Stacey Nichols sometimes feels she is out of control and then feels guilty that she made poor food choices. She has been working on behavior modification techniques to help reduce her emotional eating and has been somewhat successful. Her mood is stable and she shows no sign of suicidal or homicidal ideations. Stacey Nichols is advised to start wellbutrin two weeks after her anticipated left knee surgery on 11/06/17.  Depression screen [facility] 2/9 03/27/2017 01/21/2017 08/27/2016 06/13/2016 05/27/2016    Decreased Interest 0 3 0 0 0  Down, Depressed, Hopeless 0 3 0 0 0  PHQ - 2 Score 0 6 0 0 0  Altered sleeping - 3 - - -  Tired, decreased energy - 3 - - -  Change in appetite - 3 - - -  Feeling bad or failure about yourself  - 3 - - -  Trouble concentrating - 2 - - -  Moving slowly or fidgety/restless - 2 - - -  Suicidal thoughts - 1 - - -  PHQ-9 Score - 23 - - -      ALLERGIES: Allergies  Allergen Reactions  . Latex Other (See Comments) and Itching  . Sulfa Antibiotics Rash    [Rash] rash - paper tape OK  . Tape Rash    [Rash] rash - paper tape OK    MEDICATIONS: Current Outpatient Medications on File Prior to Visit  Medication Sig Dispense Refill  . acetaminophen (TYLENOL) 500 MG tablet Take 500 mg by mouth every 6 (six) hours as needed.    . calcium carbonate (OS-CAL) 600 MG tablet Take 1 tablet (600 mg total) by mouth 2 (two) times daily with a meal. 90 tablet 3  . carbamazepine (TEGRETOL XR) 200 MG 12 hr tablet Take 200 mg by mouth daily.   2  . Cyanocobalamin (VITAMIN B-12) 1000 MCG/15ML LIQD Take 500 mcg by mouth once a week.    . EPIPEN 2-PAK 0.3 MG/0.3ML SOAJ injection USE AS DIRECTED AS NEEDED  1  . fluticasone (FLONASE) 50  MCG/ACT nasal spray Place 1-2 sprays into both nostrils 3 (three) times a week.     . Iron TABS Take 1 tablet by mouth daily.    . meloxicam (MOBIC) 15 MG tablet Take 15 mg by mouth daily.    . montelukast (SINGULAIR) 10 MG tablet Take 1 tablet (10 mg total) by mouth daily. 90 tablet 1  . Multiple Vitamins-Minerals (MULTI COMPLETE PO) Take 1 tablet by mouth daily.    . polyethylene glycol powder (GLYCOLAX/MIRALAX) powder Take 17 g by mouth daily. 3350 g 1  . rosuvastatin (CRESTOR) 10 MG tablet Take 1 tablet (10 mg total) by mouth at bedtime. 30 tablet 0   No current facility-administered medications on file prior to visit.     PAST MEDICAL HISTORY: Past Medical History:  Diagnosis Date  . 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 Anemia    hx of  . Anxiety   . Arthritis    severe  . Constipation   . Depression   . GERD (gastroesophageal reflux disease)   . H/O Bell's palsy   . H/O hiatal hernia   . Headache    occasional  . High triglycerides   . History of cancer chemotherapy   . Leukemia ([place])   . Osteopenia 06/08/2017   DEXA 2017 - T -1.8 spine  . Pneumonia    hx of   . Rheumatoid arthritis ([place])   . Sleep apnea   . Stroke ([place])    "mini stroke-caused bells palsy for 1 month"    PAST SURGICAL HISTORY: Past Surgical History:  Procedure Laterality Date  . ABDOMINAL HYSTERECTOMY  2001   heavy bleeding, precancerous cells, 1 ovary remains  . LAPAROSCOPIC GASTRIC BAND REMOVAL WITH LAPAROSCOPIC GASTRIC SLEEVE RESECTION  04/26/2014  . LAPAROSCOPIC GASTRIC BANDING  2000   performed in [place]  . TUBAL LIGATION  1996  . UPPER GI ENDOSCOPY N/A 04/26/2014   Procedure: UPPER GI ENDOSCOPY;  Surgeon: Pedro Earls, MD;  Location: [facility];  Service: General;  Laterality: N/A;    SOCIAL HISTORY: Social History   Tobacco Use  . Smoking status: Current Some Day Smoker    Years: 30.00    Types: Cigarettes  . Smokeless tobacco: Never Used  . Tobacco comment: Once every week or 2  Substance Use Topics  . Alcohol use: Yes    Comment: occasional  . Drug use: No    FAMILY HISTORY: Family History  Problem Relation Age of Onset  . Cancer Mother        cervix  . Hyperlipidemia Father   . Obesity Father   . CAD Father        MI and arrhythmia  . Cancer Sister        metastatic, ?pancrease primary  . Diabetes Maternal Grandfather     ROS: Review of Systems  Constitutional: Negative for weight loss.  Gastrointestinal: Negative for nausea and vomiting.  Genitourinary: Negative for frequency.  Musculoskeletal:       Negative for muscle weakness   Endo/Heme/Allergies: Negative for polydipsia.       Negative for polyphagia Negative for hypoglycemia Positive for cravings    Psychiatric/Behavioral: Positive for depression. Negative for suicidal ideas.    PHYSICAL EXAM: Blood pressure 112/76, pulse 68, temperature 97.9 F (36.6 C), temperature source Oral, height 5\' 3"  (1.6 m), weight 236 lb (107 kg), SpO2 95 %. Body mass index is 41.81 kg/m. Physical Exam  Constitutional: She  is oriented to person, place, and time. She appears well-developed and well-nourished.  Cardiovascular: Normal rate.  Pulmonary/Chest: Effort normal.  Musculoskeletal: Normal range of motion.  Neurological: She is oriented to person, place, and time.  Skin: Skin is warm and dry.  Psychiatric: She has a normal mood and affect. Her behavior is normal.  Vitals reviewed.   RECENT LABS AND TESTS: BMET    Component Value Date/Time   NA 138 10/21/2017 1430   NA 144 09/04/2017 1055   NA 142 01/14/2013 1757   K 3.8 10/21/2017 1430   K 4.6 01/14/2013 1757   CL 101 10/21/2017 1430   CL 107 01/14/2013 1757   CO2 29 10/21/2017 1430   CO2 29 01/14/2013 1757   GLUCOSE 88 10/21/2017 1430   GLUCOSE 95 01/14/2013 1757   BUN 25 (H) 10/21/2017 1430   BUN 18 09/04/2017 1055   BUN 11 01/14/2013 1757   CREATININE 0.74 10/21/2017 1430   CREATININE 0.76 12/25/2016 1201   CALCIUM 9.3 10/21/2017 1430   CALCIUM 9.2 01/14/2013 1757   GFRNONAA 100 09/04/2017 1055   GFRNONAA 89 12/25/2016 1201   GFRAA 115 09/04/2017 1055   GFRAA >89 12/25/2016 1201   Lab Results  Component Value Date   HGBA1C 5.9 (H) 09/04/2017   HGBA1C 5.9 (H) 05/05/2017   HGBA1C 6.0 (H) 01/21/2017   HGBA1C 6.0 05/27/2016   HGBA1C 6.2 (H) 03/01/2014   Lab Results  Component Value Date   INSULIN 33.6 (H) 09/04/2017   INSULIN 26.0 (H) 05/05/2017   INSULIN 42.1 (H) 01/21/2017   CBC    Component Value Date/Time   WBC 5.7 10/21/2017 1430   RBC 4.27 10/21/2017 1430   HGB 14.3 10/21/2017 1430   HGB 14.5 09/04/2017 1055   HCT 41.8 10/21/2017 1430   HCT 42.8 09/04/2017 1055   PLT 191.0 10/21/2017 1430   PLT 214  06/20/2015 1141   MCV 97.9 10/21/2017 1430   MCV 98 (H) 09/04/2017 1055   MCV 96 01/15/2013 0522   MCH 33.0 09/04/2017 1055   MCH 32.6 08/27/2016 0904   MCHC 34.2 10/21/2017 1430   RDW 13.7 10/21/2017 1430   RDW 13.8 09/04/2017 1055   RDW 14.3 01/15/2013 0522   LYMPHSABS 2.5 10/21/2017 1430   LYMPHSABS 2.3 09/04/2017 1055   LYMPHSABS 3.6 01/15/2013 0522   MONOABS 0.6 10/21/2017 1430   MONOABS 0.8 01/15/2013 0522   EOSABS 0.2 10/21/2017 1430   EOSABS 0.2 09/04/2017 1055   EOSABS 0.3 01/15/2013 0522   BASOSABS 0.0 10/21/2017 1430   BASOSABS 0.0 09/04/2017 1055   BASOSABS 0.0 01/15/2013 0522   Iron/TIBC/Ferritin/ %Sat No results found for: IRON, TIBC, FERRITIN, IRONPCTSAT Lipid Panel     Component Value Date/Time   CHOL 155 09/04/2017 1055   CHOL 190 01/15/2013 0522   TRIG 179 (H) 09/04/2017 1055   TRIG 307 (H) 01/15/2013 0522   HDL 57 09/04/2017 1055   HDL 44 01/15/2013 0522   CHOLHDL 2.1 08/27/2016 0904   VLDL 21 08/27/2016 0904   VLDL 61 (H) 01/15/2013 0522   LDLCALC 62 09/04/2017 1055   LDLCALC 85 01/15/2013 0522   Hepatic Function Panel     Component Value Date/Time   PROT 7.3 09/04/2017 1055   PROT 7.7 01/14/2013 1757   ALBUMIN 4.7 09/04/2017 1055   ALBUMIN 3.8 01/14/2013 1757   AST 18 09/04/2017 1055   AST 25 01/14/2013 1757   ALT 19 09/04/2017 1055   ALT 39 01/14/2013 1757   ALKPHOS  82 09/04/2017 1055   ALKPHOS 100 01/14/2013 1757   BILITOT 0.3 09/04/2017 1055   BILITOT 0.3 01/14/2013 1757      Component Value Date/Time   TSH 3.850 01/21/2017 1128   TSH 2.09 12/25/2016 1201   TSH 3.163 03/01/2014 1201   Results for JESENYA, BOWDITCH (MRN [id number]) as of 11/05/2017 08:30  Ref. Range 09/04/2017 10:55  Vitamin D, 25-Hydroxy Latest Ref Range: 30.0 - 100.0 ng/mL 42.0   ASSESSMENT AND PLAN: Prediabetes - Plan: metFORMIN (GLUCOPHAGE) 500 MG tablet  At risk for diabetes mellitus  Vitamin D deficiency - Plan: Vitamin D, Ergocalciferol, (DRISDOL) 50000  units CAPS capsule  Other depression - with emotional eating  - Plan: buPROPion (WELLBUTRIN SR) 150 MG 12 hr tablet  Class 3 severe obesity with serious comorbidity and body mass index (BMI) of 40.0 to 44.9 in adult, unspecified obesity type ([place])  PLAN:  Vitamin D Deficiency Stacey Nichols was informed that low vitamin D levels contributes to fatigue and are associated with obesity, breast, and colon cancer. She agrees to continue to take prescription Vit D @50 ,000 IU every week #4 with no refills and will follow up for routine testing of vitamin D, at least 2-3 times per year. She was informed of the risk of over-replacement of vitamin D and agrees to not increase her dose unless she discusses this with [place] first. Stacey Nichols agrees to follow up with our clinic in 3 weeks.  Pre-Diabetes Stacey Nichols will continue to work on weight loss, exercise, and decreasing simple carbohydrates in her diet to help decrease the risk of diabetes. We dicussed metformin including benefits and risks. She was informed that eating too many simple carbohydrates or too many calories at one sitting increases the likelihood of GI side effects. Stacey Nichols agrees to continue metformin 500 mg BID #60 with no refills and follow up with [place] as directed to monitor her progress.  Diabetes risk counseling Stacey Nichols was given extended (15 minutes) diabetes prevention counseling today. She is 57 y.o. female and has risk factors for diabetes including obesity and pre-diabetes. We discussed intensive lifestyle modifications today with an emphasis on weight loss as well as increasing exercise and decreasing simple carbohydrates in her diet.  Depression with Emotional Eating Behaviors We discussed behavior modification techniques today to help Stacey Nichols deal with her emotional eating and depression. She has agreed to start Wellbutrin SR 150 mg qd #30 with no refills and follow up as directed.  Obesity Stacey Nichols is currently in the action stage of change. As such, her  goal is to continue with weight loss efforts She has agreed to follow the Category 2 plan Stacey Nichols has been instructed to work up to a goal of 150 minutes of combined cardio and strengthening exercise per week for weight loss and overall health benefits. We discussed the following Behavioral Modification Strategies today: increasing lean protein intake and emotional eating strategies  Stacey Nichols has agreed to follow up with our clinic in 3 weeks. She was informed of the importance of frequent follow up visits to maximize her success with intensive lifestyle modifications for her multiple health conditions.    OBESITY BEHAVIORAL INTERVENTION VISIT  Today's visit was # 17 out of 22.  Starting weight: 246 lbs Starting date: 01/21/17 Today's weight : 236 lbs  Today's date: 11/04/2017 Total lbs lost to date: 10 (Patients must lose 7 lbs in the first 6 months to continue with counseling)   ASK: We discussed the diagnosis of obesity with Stacey Nichols today and  Stacey Nichols agreed to give [place] permission to discuss obesity behavioral modification therapy today.  ASSESS: Stacey Nichols has the diagnosis of obesity and her BMI today is 75.82 Stacey Nichols is in the action stage of change   ADVISE: Stacey Nichols was educated on the multiple health risks of obesity as well as the benefit of weight loss to improve her health. She was advised of the need for long term treatment and the importance of lifestyle modifications.  AGREE: Multiple dietary modification options and treatment options were discussed and  Stacey Nichols agreed to the above obesity treatment plan.   Corey Skains, am acting as transcriptionist for [employer], PA-C I, Lacy Duverney [facility], have reviewed this note and agree with its content

## 2017-11-27 ENCOUNTER — Ambulatory Visit (INDEPENDENT_AMBULATORY_CARE_PROVIDER_SITE_OTHER): Payer: BLUE CROSS/BLUE SHIELD | Admitting: Physician Assistant

## 2017-11-27 VITALS — BP 103/71 | HR 75 | Temp 98.1°F | Ht 63.0 in | Wt 229.0 lb

## 2017-11-27 DIAGNOSIS — F3289 Other specified depressive episodes: Secondary | ICD-10-CM

## 2017-11-27 DIAGNOSIS — Z862 Personal history of diseases of the blood and blood-forming organs and certain disorders involving the immune mechanism: Secondary | ICD-10-CM

## 2017-11-27 DIAGNOSIS — Z6841 Body Mass Index (BMI) 40.0 and over, adult: Secondary | ICD-10-CM

## 2017-11-27 MED ORDER — FERROUS SULFATE 325 (65 FE) MG PO TABS: 325.0000 mg | ORAL_TABLET | Freq: Every day | ORAL | 0 refills | Status: DC

## 2017-11-27 NOTE — Progress Notes (Signed)
Office: [phone]  /  Fax: [phone]   HPI:   Chief Complaint: OBESITY Stacey Nichols is here to discuss her progress with her obesity treatment plan. She is on the Category 2 plan and is following her eating plan approximately 0 % of the time. She states she is exercising 0 minutes 0 times per week. Stacey Nichols is status post total knee surgery 3 weeks ago. She states she has not been eating much and skips meals on occasions as she has not had much of an appetite. She has been eating much fruits and vegetables.  Her weight is 229 lb (103.9 kg) today and has had a weight loss of 7 pounds over a period of 3 weeks since her last visit. She has lost 17 lbs since starting treatment with [place]. 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 Hgb stable. Pt requests a refill. She denies any cp or dyspnea, or jaundice.   Depression with emotional eating behaviors Stacey Nichols's mood is stable on Wellbutrin. Stacey Nichols struggles with emotional eating and using food for comfort to the extent that it is negatively impacting her health. She often snacks when she is not hungry. Stacey Nichols sometimes feels she is out of control and then feels guilty that she made poor food choices. She has been working on behavior modification techniques to help reduce her emotional eating and has been somewhat successful. She shows no sign of suicidal or homicidal ideations.  Depression screen [facility] 2/9 03/27/2017 01/21/2017 08/27/2016 06/13/2016 05/27/2016  Decreased Interest 0 3 0 0 0  Down, Depressed, Hopeless 0 3 0 0 0  PHQ - 2 Score 0 6 0 0 0  Altered sleeping - 3 - - -  Tired, decreased energy - 3 - - -  Change in appetite - 3 - - -  Feeling bad or failure about yourself  - 3 - - -  Trouble concentrating - 2 - - -  Moving slowly or fidgety/restless - 2 - - -  Suicidal thoughts - 1 - - -  PHQ-9 Score - 23 - - -    ALLERGIES: Allergies    Allergen Reactions  . Latex Other (See Comments) and Itching  . Sulfa Antibiotics Rash    [Rash] rash - paper tape OK  . Tape Rash    [Rash] rash - paper tape OK    MEDICATIONS: Current Outpatient Medications on File Prior to Visit  Medication Sig Dispense Refill  . acetaminophen (TYLENOL) 500 MG tablet Take 500 mg by mouth every 6 (six) hours as needed.    Marland Kitchen aspirin 325 MG tablet Take 325 mg by mouth daily.    Marland Kitchen buPROPion (WELLBUTRIN SR) 150 MG 12 hr tablet Take 1 tablet (150 mg total) by mouth daily. 30 tablet 0  . calcium carbonate (OS-CAL) 600 MG tablet Take 1 tablet (600 mg total) by mouth 2 (two) times daily with a meal. 90 tablet 3  . carbamazepine (TEGRETOL XR) 200 MG 12 hr tablet Take 200 mg by mouth daily.   2  . celecoxib (CELEBREX) 200 MG capsule Take 200 mg by mouth daily.    . Cyanocobalamin (VITAMIN B-12) 1000 MCG/15ML LIQD Take 500 mcg by mouth once a week.    . EPIPEN 2-PAK 0.3 MG/0.3ML SOAJ injection USE AS DIRECTED AS NEEDED  1  . fluticasone (FLONASE) 50 MCG/ACT nasal spray Place 1-2 sprays into both nostrils 3 (three) times a  week.     Marland Kitchen HYDROcodone-acetaminophen (NORCO) 7.5-325 MG tablet Take 1 tablet by mouth every 6 (six) hours as needed for moderate pain.    . meloxicam (MOBIC) 15 MG tablet Take 15 mg by mouth daily.    . metFORMIN (GLUCOPHAGE) 500 MG tablet Take 1 tablet (500 mg total) by mouth 2 (two) times daily with a meal. 60 tablet 0  . montelukast (SINGULAIR) 10 MG tablet Take 1 tablet (10 mg total) by mouth daily. 90 tablet 1  . Multiple Vitamins-Minerals (MULTI COMPLETE PO) Take 1 tablet by mouth daily.    . polyethylene glycol powder (GLYCOLAX/MIRALAX) powder Take 17 g by mouth daily. 3350 g 1  . rosuvastatin (CRESTOR) 10 MG tablet Take 1 tablet (10 mg total) by mouth at bedtime. 30 tablet 0  . Vitamin D, Ergocalciferol, (DRISDOL) 50000 units CAPS capsule Take 1 capsule (50,000 Units total) by mouth every 7 (seven) days. 4 capsule 0   No current  facility-administered medications on file prior to visit.     PAST MEDICAL HISTORY: Past Medical History:  Diagnosis Date  . 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 Anemia    hx of  . Anxiety   . Arthritis    severe  . Constipation   . Depression   . GERD (gastroesophageal reflux disease)   . H/O Bell's palsy   . H/O hiatal hernia   . Headache    occasional  . High triglycerides   . History of cancer chemotherapy   . Leukemia ([place])   . Osteopenia 06/08/2017   DEXA 2017 - T -1.8 spine  . Pneumonia    hx of   . Rheumatoid arthritis ([place])   . Sleep apnea   . Stroke ([place])    "mini stroke-caused bells palsy for 1 month"    PAST SURGICAL HISTORY: Past Surgical History:  Procedure Laterality Date  . ABDOMINAL HYSTERECTOMY  2001   heavy bleeding, precancerous cells, 1 ovary remains  . LAPAROSCOPIC GASTRIC BAND REMOVAL WITH LAPAROSCOPIC GASTRIC SLEEVE RESECTION  04/26/2014  . LAPAROSCOPIC GASTRIC BANDING  2000   performed in [place]  . TUBAL LIGATION  1996  . UPPER GI ENDOSCOPY N/A 04/26/2014   Procedure: UPPER GI ENDOSCOPY;  Surgeon: Pedro Earls, MD;  Location: [facility];  Service: General;  Laterality: N/A;    SOCIAL HISTORY: Social History   Tobacco Use  . Smoking status: Current Some Day Smoker    Years: 30.00    Types: Cigarettes  . Smokeless tobacco: Never Used  . Tobacco comment: Once every week or 2  Substance Use Topics  . Alcohol use: Yes    Comment: occasional  . Drug use: No    FAMILY HISTORY: Family History  Problem Relation Age of Onset  . Cancer Mother        cervix  . Hyperlipidemia Father   . Obesity Father   . CAD Father        MI and arrhythmia  . Cancer Sister        metastatic, ?pancrease primary  . Diabetes Maternal Grandfather     ROS: Review of Systems  Constitutional: Positive for weight loss.  Eyes:       Negative jaundice  Respiratory: Negative for shortness of breath.   Cardiovascular:  Negative for chest pain.  Psychiatric/Behavioral: Positive for depression. Negative for suicidal ideas.    PHYSICAL EXAM: Blood pressure 103/71, pulse 75, temperature 98.1 F (36.7 C), temperature  source Oral, height 5\' 3"  (1.6 m), weight 229 lb (103.9 kg), SpO2 95 %. Body mass index is 40.57 kg/m. Physical Exam  Constitutional: She is oriented to person, place, and time. She appears well-developed and well-nourished.  Cardiovascular: Normal rate.  Pulmonary/Chest: Effort normal.  Musculoskeletal: Normal range of motion.  Neurological: She is oriented to person, place, and time.  Skin: Skin is warm and dry.  Psychiatric: She has a normal mood and affect. Her behavior is normal.  Vitals reviewed.   RECENT LABS AND TESTS: BMET    Component Value Date/Time   NA 138 10/21/2017 1430   NA 144 09/04/2017 1055   NA 142 01/14/2013 1757   K 3.8 10/21/2017 1430   K 4.6 01/14/2013 1757   CL 101 10/21/2017 1430   CL 107 01/14/2013 1757   CO2 29 10/21/2017 1430   CO2 29 01/14/2013 1757   GLUCOSE 88 10/21/2017 1430   GLUCOSE 95 01/14/2013 1757   BUN 25 (H) 10/21/2017 1430   BUN 18 09/04/2017 1055   BUN 11 01/14/2013 1757   CREATININE 0.74 10/21/2017 1430   CREATININE 0.76 12/25/2016 1201   CALCIUM 9.3 10/21/2017 1430   CALCIUM 9.2 01/14/2013 1757   GFRNONAA 100 09/04/2017 1055   GFRNONAA 89 12/25/2016 1201   GFRAA 115 09/04/2017 1055   GFRAA >89 12/25/2016 1201   Lab Results  Component Value Date   HGBA1C 5.9 (H) 09/04/2017   HGBA1C 5.9 (H) 05/05/2017   HGBA1C 6.0 (H) 01/21/2017   HGBA1C 6.0 05/27/2016   HGBA1C 6.2 (H) 03/01/2014   Lab Results  Component Value Date   INSULIN 33.6 (H) 09/04/2017   INSULIN 26.0 (H) 05/05/2017   INSULIN 42.1 (H) 01/21/2017   CBC    Component Value Date/Time   WBC 5.7 10/21/2017 1430   RBC 4.27 10/21/2017 1430   HGB 14.3 10/21/2017 1430   HGB 14.5 09/04/2017 1055   HCT 41.8 10/21/2017 1430   HCT 42.8 09/04/2017 1055   PLT 191.0  10/21/2017 1430   PLT 214 06/20/2015 1141   MCV 97.9 10/21/2017 1430   MCV 98 (H) 09/04/2017 1055   MCV 96 01/15/2013 0522   MCH 33.0 09/04/2017 1055   MCH 32.6 08/27/2016 0904   MCHC 34.2 10/21/2017 1430   RDW 13.7 10/21/2017 1430   RDW 13.8 09/04/2017 1055   RDW 14.3 01/15/2013 0522   LYMPHSABS 2.5 10/21/2017 1430   LYMPHSABS 2.3 09/04/2017 1055   LYMPHSABS 3.6 01/15/2013 0522   MONOABS 0.6 10/21/2017 1430   MONOABS 0.8 01/15/2013 0522   EOSABS 0.2 10/21/2017 1430   EOSABS 0.2 09/04/2017 1055   EOSABS 0.3 01/15/2013 0522   BASOSABS 0.0 10/21/2017 1430   BASOSABS 0.0 09/04/2017 1055   BASOSABS 0.0 01/15/2013 0522   Iron/TIBC/Ferritin/ %Sat No results found for: IRON, TIBC, FERRITIN, IRONPCTSAT Lipid Panel     Component Value Date/Time   CHOL 155 09/04/2017 1055   CHOL 190 01/15/2013 0522   TRIG 179 (H) 09/04/2017 1055   TRIG 307 (H) 01/15/2013 0522   HDL 57 09/04/2017 1055   HDL 44 01/15/2013 0522   CHOLHDL 2.1 08/27/2016 0904   VLDL 21 08/27/2016 0904   VLDL 61 (H) 01/15/2013 0522   LDLCALC 62 09/04/2017 1055   LDLCALC 85 01/15/2013 0522   Hepatic Function Panel     Component Value Date/Time   PROT 7.3 09/04/2017 1055   PROT 7.7 01/14/2013 1757   ALBUMIN 4.7 09/04/2017 1055   ALBUMIN 3.8 01/14/2013 1757  AST 18 09/04/2017 1055   AST 25 01/14/2013 1757   ALT 19 09/04/2017 1055   ALT 39 01/14/2013 1757   ALKPHOS 82 09/04/2017 1055   ALKPHOS 100 01/14/2013 1757   BILITOT 0.3 09/04/2017 1055   BILITOT 0.3 01/14/2013 1757      Component Value Date/Time   TSH 3.850 01/21/2017 1128   TSH 2.09 12/25/2016 1201   TSH 3.163 03/01/2014 1201    ASSESSMENT AND PLAN: History of iron deficiency anemia - Plan: ferrous sulfate 325 (65 FE) MG tablet  Other depression - with emotional eating  Class 3 severe obesity with serious comorbidity and body mass index (BMI) of 40.0 to 44.9 in adult, unspecified obesity type (HCC)  PLAN:  History of iron deficiency  anemia Candia agrees to continue taking iron ferrous sulfate 325 mg q AM #30 and we will refill for 1 month. Tamiah agrees to follow up with our clinic in 3 weeks.  Depression with Emotional Eating Behaviors We discussed behavior modification techniques today to help Marria deal with her emotional eating and depression. Asmaa agrees to continue taking her medications and she agrees to follow up with our clinic in 3 weeks.  Obesity Jonet is currently in the action stage of change. As such, her goal is to continue with weight loss efforts She has agreed to portion control better and make smarter food choices, such as increase vegetables and decrease simple carbohydrates  Lynann has been instructed to work up to a goal of 150 minutes of combined cardio and strengthening exercise per week for weight loss and overall health benefits. We discussed the following Behavioral Modification Strategies today: increasing lean protein intake and work on meal planning and easy cooking plans   Solomiya has agreed to follow up with our clinic in 3 weeks. She was informed of the importance of frequent follow up visits to maximize her success with intensive lifestyle modifications for her multiple health conditions.   OBESITY BEHAVIORAL INTERVENTION VISIT  Today's visit was # 18 out of 22.  Starting weight: 246 lbs Starting date: 01/21/17 Today's weight : 229 lbs  Today's date: 12/01/2017 Total lbs lost to date: 17 (Patients must lose 7 lbs in the first 6 months to continue with counseling)   ASK: We discussed the diagnosis of obesity with Windell Moment today and Lissa agreed to give [place] permission to discuss obesity behavioral modification therapy today.  ASSESS: Jaquelynn has the diagnosis of obesity and her BMI today is 40.58 Destany is in the action stage of change   ADVISE: Shannara was educated on the multiple health risks of obesity as well as the benefit of weight loss to improve her health. She was advised  of the need for long term treatment and the importance of lifestyle modifications.  AGREE: Multiple dietary modification options and treatment options were discussed and  Inez agreed to the above obesity treatment plan.   Wilhemena Durie, am acting as transcriptionist for Lacy Duverney, PA-C I, Lacy Duverney [facility], have reviewed this note and agree with its content

## 2017-12-18 ENCOUNTER — Ambulatory Visit (INDEPENDENT_AMBULATORY_CARE_PROVIDER_SITE_OTHER): Payer: BLUE CROSS/BLUE SHIELD | Admitting: Physician Assistant

## 2017-12-18 VITALS — BP 102/71 | HR 84 | Temp 98.2°F | Ht 63.0 in | Wt 229.0 lb

## 2017-12-18 DIAGNOSIS — E559 Vitamin D deficiency, unspecified: Secondary | ICD-10-CM

## 2017-12-18 DIAGNOSIS — Z6841 Body Mass Index (BMI) 40.0 and over, adult: Secondary | ICD-10-CM | POA: Diagnosis not present

## 2017-12-18 NOTE — Progress Notes (Signed)
Office: [phone]  /  Fax: [phone]   HPI:   Chief Complaint: OBESITY Stacey Nichols is here to discuss her progress with her obesity treatment plan. She is on the portion control better and make smarter food choices, such as increase vegetables and decrease simple carbohydrates and is following her eating plan approximately 50 % of the time. She states she is doing physical therapy with knee for 10 minutes 7 times per week. Stacey Nichols maintained her weight. She would like more meal ideas with seafood.  Her weight is 229 lb (103.9 kg) today and has not lost weight since her last visit. She has lost 17 lbs since starting treatment with [place].  Vitamin D Deficiency Stacey Nichols has a diagnosis of vitamin D deficiency. She is currently taking prescription Vit D and denies nausea, vomiting or muscle weakness.  ALLERGIES: Allergies  Allergen Reactions  . Latex Other (See Comments) and Itching  . Sulfa Antibiotics Rash    [Rash] rash - paper tape OK  . Tape Rash    [Rash] rash - paper tape OK    MEDICATIONS: Current Outpatient Medications on File Prior to Visit  Medication Sig Dispense Refill  . acetaminophen (TYLENOL) 500 MG tablet Take 500 mg by mouth every 6 (six) hours as needed.    Marland Kitchen aspirin 325 MG tablet Take 325 mg by mouth daily.    Marland Kitchen buPROPion (WELLBUTRIN SR) 150 MG 12 hr tablet Take 1 tablet (150 mg total) by mouth daily. 30 tablet 0  . calcium carbonate (OS-CAL) 600 MG tablet Take 1 tablet (600 mg total) by mouth 2 (two) times daily with a meal. 90 tablet 3  . carbamazepine (TEGRETOL XR) 200 MG 12 hr tablet Take 200 mg by mouth daily.   2  . celecoxib (CELEBREX) 200 MG capsule Take 200 mg by mouth daily.    . Cyanocobalamin (VITAMIN B-12) 1000 MCG/15ML LIQD Take 500 mcg by mouth once a week.    . EPIPEN 2-PAK 0.3 MG/0.3ML SOAJ injection USE AS DIRECTED AS NEEDED  1  . ferrous sulfate 325 (65 FE) MG tablet Take 1 tablet (325 mg total) by mouth daily with breakfast. 30 tablet 0  .  fluticasone (FLONASE) 50 MCG/ACT nasal spray Place 1-2 sprays into both nostrils 3 (three) times a week.     Marland Kitchen HYDROcodone-acetaminophen (NORCO) 7.5-325 MG tablet Take 1 tablet by mouth every 6 (six) hours as needed for moderate pain.    . meloxicam (MOBIC) 15 MG tablet Take 15 mg by mouth daily.    . metFORMIN (GLUCOPHAGE) 500 MG tablet Take 1 tablet (500 mg total) by mouth 2 (two) times daily with a meal. 60 tablet 0  . montelukast (SINGULAIR) 10 MG tablet Take 1 tablet (10 mg total) by mouth daily. 90 tablet 1  . Multiple Vitamins-Minerals (MULTI COMPLETE PO) Take 1 tablet by mouth daily.    . polyethylene glycol powder (GLYCOLAX/MIRALAX) powder Take 17 g by mouth daily. 3350 g 1  . rosuvastatin (CRESTOR) 10 MG tablet Take 1 tablet (10 mg total) by mouth at bedtime. 30 tablet 0  . Vitamin D, Ergocalciferol, (DRISDOL) 50000 units CAPS capsule Take 1 capsule (50,000 Units total) by mouth every 7 (seven) days. 4 capsule 0   No current facility-administered medications on file prior to visit.     PAST MEDICAL HISTORY: Past Medical History:  Diagnosis Date  . AML (acute myeloblastic leukemia) ([place]) 2007   s/p chemo (Dr Lissa Merlin)  . Anemia    hx of  .  Anxiety   . Arthritis    severe  . Constipation   . Depression   . GERD (gastroesophageal reflux disease)   . H/O Bell's palsy   . H/O hiatal hernia   . Headache    occasional  . High triglycerides   . History of cancer chemotherapy   . Leukemia ([place])   . Osteopenia 06/08/2017   DEXA 2017 - T -1.8 spine  . Pneumonia    hx of   . Rheumatoid arthritis ([place])   . Sleep apnea   . Stroke ([place])    "mini stroke-caused bells palsy for 1 month"    PAST SURGICAL HISTORY: Past Surgical History:  Procedure Laterality Date  . ABDOMINAL HYSTERECTOMY  2001   heavy bleeding, precancerous cells, 1 ovary remains  . LAPAROSCOPIC GASTRIC BAND REMOVAL WITH LAPAROSCOPIC GASTRIC SLEEVE RESECTION  04/26/2014  . LAPAROSCOPIC GASTRIC BANDING  2000    performed in [place]  . TUBAL LIGATION  1996  . UPPER GI ENDOSCOPY N/A 04/26/2014   Procedure: UPPER GI ENDOSCOPY;  Surgeon: Pedro Earls, MD;  Location: [facility];  Service: General;  Laterality: N/A;    SOCIAL HISTORY: Social History   Tobacco Use  . Smoking status: Current Some Day Smoker    Years: 30.00    Types: Cigarettes  . Smokeless tobacco: Never Used  . Tobacco comment: Once every week or 2  Substance Use Topics  . Alcohol use: Yes    Comment: occasional  . Drug use: No    FAMILY HISTORY: Family History  Problem Relation Age of Onset  . Cancer Mother        cervix  . Hyperlipidemia Father   . Obesity Father   . CAD Father        MI and arrhythmia  . Cancer Sister        metastatic, ?pancrease primary  . Diabetes Maternal Grandfather     ROS: Review of Systems  Constitutional: Negative for weight loss.  Gastrointestinal: Negative for nausea and vomiting.  Musculoskeletal:       Negative muscle weakness    PHYSICAL EXAM: Blood pressure 102/71, pulse 84, temperature 98.2 F (36.8 C), temperature source Oral, height 5\' 3"  (1.6 m), weight 229 lb (103.9 kg), SpO2 91 %. Body mass index is 40.57 kg/m. Physical Exam  Constitutional: She is oriented to person, place, and time. She appears well-developed and well-nourished.  Cardiovascular: Normal rate.  Pulmonary/Chest: Effort normal.  Musculoskeletal: Normal range of motion.  Neurological: She is oriented to person, place, and time.  Skin: Skin is warm and dry.  Psychiatric: She has a normal mood and affect. Her behavior is normal.  Vitals reviewed.   RECENT LABS AND TESTS: BMET    Component Value Date/Time   NA 138 10/21/2017 1430   NA 144 09/04/2017 1055   NA 142 01/14/2013 1757   K 3.8 10/21/2017 1430   K 4.6 01/14/2013 1757   CL 101 10/21/2017 1430   CL 107 01/14/2013 1757   CO2 29 10/21/2017 1430   CO2 29 01/14/2013 1757   GLUCOSE 88 10/21/2017 1430   GLUCOSE 95 01/14/2013 1757   BUN 25  (H) 10/21/2017 1430   BUN 18 09/04/2017 1055   BUN 11 01/14/2013 1757   CREATININE 0.74 10/21/2017 1430   CREATININE 0.76 12/25/2016 1201   CALCIUM 9.3 10/21/2017 1430   CALCIUM 9.2 01/14/2013 1757   GFRNONAA 100 09/04/2017 1055   GFRNONAA 89 12/25/2016 1201   GFRAA 115 09/04/2017 1055  GFRAA >89 12/25/2016 1201   Lab Results  Component Value Date   HGBA1C 5.9 (H) 09/04/2017   HGBA1C 5.9 (H) 05/05/2017   HGBA1C 6.0 (H) 01/21/2017   HGBA1C 6.0 05/27/2016   HGBA1C 6.2 (H) 03/01/2014   Lab Results  Component Value Date   INSULIN 33.6 (H) 09/04/2017   INSULIN 26.0 (H) 05/05/2017   INSULIN 42.1 (H) 01/21/2017   CBC    Component Value Date/Time   WBC 5.7 10/21/2017 1430   RBC 4.27 10/21/2017 1430   HGB 14.3 10/21/2017 1430   HGB 14.5 09/04/2017 1055   HCT 41.8 10/21/2017 1430   HCT 42.8 09/04/2017 1055   PLT 191.0 10/21/2017 1430   PLT 214 06/20/2015 1141   MCV 97.9 10/21/2017 1430   MCV 98 (H) 09/04/2017 1055   MCV 96 01/15/2013 0522   MCH 33.0 09/04/2017 1055   MCH 32.6 08/27/2016 0904   MCHC 34.2 10/21/2017 1430   RDW 13.7 10/21/2017 1430   RDW 13.8 09/04/2017 1055   RDW 14.3 01/15/2013 0522   LYMPHSABS 2.5 10/21/2017 1430   LYMPHSABS 2.3 09/04/2017 1055   LYMPHSABS 3.6 01/15/2013 0522   MONOABS 0.6 10/21/2017 1430   MONOABS 0.8 01/15/2013 0522   EOSABS 0.2 10/21/2017 1430   EOSABS 0.2 09/04/2017 1055   EOSABS 0.3 01/15/2013 0522   BASOSABS 0.0 10/21/2017 1430   BASOSABS 0.0 09/04/2017 1055   BASOSABS 0.0 01/15/2013 0522   Iron/TIBC/Ferritin/ %Sat No results found for: IRON, TIBC, FERRITIN, IRONPCTSAT Lipid Panel     Component Value Date/Time   CHOL 155 09/04/2017 1055   CHOL 190 01/15/2013 0522   TRIG 179 (H) 09/04/2017 1055   TRIG 307 (H) 01/15/2013 0522   HDL 57 09/04/2017 1055   HDL 44 01/15/2013 0522   CHOLHDL 2.1 08/27/2016 0904   VLDL 21 08/27/2016 0904   VLDL 61 (H) 01/15/2013 0522   LDLCALC 62 09/04/2017 1055   LDLCALC 85 01/15/2013 0522    Hepatic Function Panel     Component Value Date/Time   PROT 7.3 09/04/2017 1055   PROT 7.7 01/14/2013 1757   ALBUMIN 4.7 09/04/2017 1055   ALBUMIN 3.8 01/14/2013 1757   AST 18 09/04/2017 1055   AST 25 01/14/2013 1757   ALT 19 09/04/2017 1055   ALT 39 01/14/2013 1757   ALKPHOS 82 09/04/2017 1055   ALKPHOS 100 01/14/2013 1757   BILITOT 0.3 09/04/2017 1055   BILITOT 0.3 01/14/2013 1757      Component Value Date/Time   TSH 3.850 01/21/2017 1128   TSH 2.09 12/25/2016 1201   TSH 3.163 03/01/2014 1201  Results for LONDA, MACKOWSKI (MRN [id number]) as of 12/18/2017 15:49  Ref. Range 09/04/2017 10:55  Vitamin D, 25-Hydroxy Latest Ref Range: 30.0 - 100.0 ng/mL 42.0    ASSESSMENT AND PLAN: Vitamin D deficiency  Class 3 severe obesity with serious comorbidity and body mass index (BMI) of 40.0 to 44.9 in adult, unspecified obesity type ([place])  PLAN:  Vitamin D Deficiency Stacey Nichols was informed that low vitamin D levels contributes to fatigue and are associated with obesity, breast, and colon cancer. Stacey Nichols agrees to continue taking prescription Vit D @50 ,000 IU every week and will follow up for routine testing of vitamin D, at least 2-3 times per year. She was informed of the risk of over-replacement of vitamin D and agrees to not increase her dose unless she discusses this with [place] first. Stacey Nichols agrees to follow up with our clinic in 3 weeks.  We spent >  than 50% of the 15 minute visit on the counseling as documented in the note.  Obesity Stacey Nichols is currently in the action stage of change. As such, her goal is to continue with weight loss efforts She has agreed to follow the Pescatarian eating plan Stacey Nichols has been instructed to work up to a goal of 150 minutes of combined cardio and strengthening exercise per week for weight loss and overall health benefits. We discussed the following Behavioral Modification Strategies today: increasing lean protein intake and work on meal planning and easy  cooking plans   Stacey Nichols has agreed to follow up with our clinic in 3 weeks. She was informed of the importance of frequent follow up visits to maximize her success with intensive lifestyle modifications for her multiple health conditions.   OBESITY BEHAVIORAL INTERVENTION VISIT  Today's visit was # 19 out of 22.  Starting weight: 246 lbs Starting date: 01/21/17 Today's weight : 229 lbs  Today's date: 12/18/2017 Total lbs lost to date: 17 (Patients must lose 7 lbs in the first 6 months to continue with counseling)   ASK: We discussed the diagnosis of obesity with Stacey Nichols Moment today and Stacey Nichols agreed to give [place] permission to discuss obesity behavioral modification therapy today.  ASSESS: Stacey Nichols has the diagnosis of obesity and her BMI today is 40.58 Stacey Nichols is in the action stage of change   ADVISE: Stacey Nichols was educated on the multiple health risks of obesity as well as the benefit of weight loss to improve her health. She was advised of the need for long term treatment and the importance of lifestyle modifications.  AGREE: Multiple dietary modification options and treatment options were discussed and  Stacey Nichols agreed to the above obesity treatment plan.   Stacey Nichols, am acting as transcriptionist for Lacy Duverney, PA-C I, Lacy Duverney [facility], have reviewed this note and agree with its content

## 2017-12-26 ENCOUNTER — Other Ambulatory Visit: Payer: Self-pay | Admitting: Family Medicine

## 2017-12-26 ENCOUNTER — Other Ambulatory Visit (INDEPENDENT_AMBULATORY_CARE_PROVIDER_SITE_OTHER): Payer: BLUE CROSS/BLUE SHIELD

## 2017-12-26 DIAGNOSIS — E785 Hyperlipidemia, unspecified: Secondary | ICD-10-CM | POA: Diagnosis not present

## 2017-12-26 DIAGNOSIS — R7303 Prediabetes: Secondary | ICD-10-CM | POA: Diagnosis not present

## 2017-12-26 DIAGNOSIS — Z856 Personal history of leukemia: Secondary | ICD-10-CM | POA: Diagnosis not present

## 2017-12-26 LAB — COMPREHENSIVE METABOLIC PANEL
ALBUMIN: 4.1 g/dL (ref 3.5–5.2)
ALT: 18 U/L (ref 0–35)
AST: 16 U/L (ref 0–37)
Alkaline Phosphatase: 73 U/L (ref 39–117)
BILIRUBIN TOTAL: 0.4 mg/dL (ref 0.2–1.2)
BUN: 10 mg/dL (ref 6–23)
CHLORIDE: 106 meq/L (ref 96–112)
CO2: 28 mEq/L (ref 19–32)
CREATININE: 0.7 mg/dL (ref 0.40–1.20)
Calcium: 9.3 mg/dL (ref 8.4–10.5)
GFR: 92.01 mL/min (ref >60.00)
Glucose, Bld: 119 mg/dL — ABNORMAL HIGH (ref 70–99)
Potassium: 4 mEq/L (ref 3.5–5.1)
Sodium: 142 mEq/L (ref 135–145)
Total Protein: 6.8 g/dL (ref 6.0–8.3)

## 2017-12-26 LAB — LIPID PANEL
CHOLESTEROL: 153 mg/dL (ref 0–200)
HDL: 57.7 mg/dL (ref >39.00)
NonHDL: 95.11
TRIGLYCERIDES: 204 mg/dL — AB (ref 0.0–149.0)
Total CHOL/HDL Ratio: 3
VLDL: 40.8 mg/dL — ABNORMAL HIGH (ref 0.0–40.0)

## 2017-12-26 LAB — HEMOGLOBIN A1C: Hgb A1c MFr Bld: 5.9 % (ref 4.6–6.5)

## 2017-12-26 LAB — MAGNESIUM: Magnesium: 1.8 mg/dL (ref 1.5–2.5)

## 2017-12-26 LAB — LDL CHOLESTEROL, DIRECT: LDL DIRECT: 72 mg/dL

## 2017-12-26 LAB — URIC ACID: Uric Acid, Serum: 6.5 mg/dL (ref 2.4–7.0)

## 2017-12-26 NOTE — Addendum Note (Signed)
Addended by: Ellamae Sia on: 12/26/2017 09:21 AM   Modules accepted: Orders

## 2017-12-30 ENCOUNTER — Encounter: Payer: BLUE CROSS/BLUE SHIELD | Admitting: Family Medicine

## 2017-12-30 DIAGNOSIS — Z0289 Encounter for other administrative examinations: Secondary | ICD-10-CM

## 2018-01-08 ENCOUNTER — Ambulatory Visit (INDEPENDENT_AMBULATORY_CARE_PROVIDER_SITE_OTHER): Payer: BLUE CROSS/BLUE SHIELD | Admitting: Physician Assistant

## 2018-01-08 VITALS — BP 105/69 | HR 87 | Temp 98.1°F | Ht 63.0 in | Wt 233.0 lb

## 2018-01-08 DIAGNOSIS — E7849 Other hyperlipidemia: Secondary | ICD-10-CM

## 2018-01-08 DIAGNOSIS — Z6841 Body Mass Index (BMI) 40.0 and over, adult: Secondary | ICD-10-CM

## 2018-01-08 DIAGNOSIS — R7303 Prediabetes: Secondary | ICD-10-CM

## 2018-01-08 DIAGNOSIS — Z9189 Other specified personal risk factors, not elsewhere classified: Secondary | ICD-10-CM | POA: Diagnosis not present

## 2018-01-08 MED ORDER — METFORMIN HCL 500 MG PO TABS: 500.0000 mg | ORAL_TABLET | Freq: Two times a day (BID) | ORAL | 0 refills | Status: DC

## 2018-01-08 MED ORDER — ROSUVASTATIN CALCIUM 10 MG PO TABS: 10.0000 mg | ORAL_TABLET | Freq: Every day | ORAL | 0 refills | Status: DC

## 2018-01-08 NOTE — Progress Notes (Signed)
Office: [phone]  /  Fax: [phone]   HPI:   Chief Complaint: OBESITY Stacey Nichols is here to discuss her progress with her obesity treatment plan. She is on the Pescatarian eating plan and is following her eating plan approximately 50 % of the time. She states she is walking for 10 minutes 7 times per week. Stacey Nichols enjoyed [employer] but states she has not followed it strictly. States she is motivated to get back on track and continue weight loss. She requests a longer follow up time as she will be traveling out of the country.  Her weight is 233 lb (105.7 kg) today and has gained 4 pounds since her last visit. She has lost 13 lbs since starting treatment with [place].  Hyperlipidemia Stacey Nichols has hyperlipidemia and has been trying to improve her cholesterol levels with intensive lifestyle modification including a low saturated fat diet, exercise and weight loss. She denies any chest pain, claudication or myalgias.  At risk for cardiovascular disease Stacey Nichols is at a higher than average risk for cardiovascular disease due to obesity and hyperlipidemia. She currently denies any chest pain.  Pre-Diabetes Stacey Nichols has a diagnosis of pre-diabetes based on her elevated Hgb A1c and was informed this puts her at greater risk of developing diabetes. She is taking metformin currently and continues to work on diet and exercise to decrease risk of diabetes. She denies polyphagia, nausea, or hypoglycemia.  ALLERGIES: Allergies  Allergen Reactions  . Latex Other (See Comments) and Itching  . Sulfa Antibiotics Rash    [Rash] rash - paper tape OK  . Tape Rash    [Rash] rash - paper tape OK    MEDICATIONS: Current Outpatient Medications on File Prior to Visit  Medication Sig Dispense Refill  . acetaminophen (TYLENOL) 500 MG tablet Take 500 mg by mouth every 6 (six) hours as needed.    Marland Kitchen aspirin 325 MG tablet Take 325 mg by mouth daily.    Marland Kitchen buPROPion (WELLBUTRIN SR) 150 MG 12 hr tablet Take 1 tablet (150 mg  total) by mouth daily. 30 tablet 0  . calcium carbonate (OS-CAL) 600 MG tablet Take 1 tablet (600 mg total) by mouth 2 (two) times daily with a meal. 90 tablet 3  . carbamazepine (TEGRETOL XR) 200 MG 12 hr tablet Take 200 mg by mouth daily.   2  . celecoxib (CELEBREX) 200 MG capsule Take 200 mg by mouth daily.    . Cyanocobalamin (VITAMIN B-12) 1000 MCG/15ML LIQD Take 500 mcg by mouth once a week.    . EPIPEN 2-PAK 0.3 MG/0.3ML SOAJ injection USE AS DIRECTED AS NEEDED  1  . ferrous sulfate 325 (65 FE) MG tablet Take 1 tablet (325 mg total) by mouth daily with breakfast. 30 tablet 0  . fluticasone (FLONASE) 50 MCG/ACT nasal spray Place 1-2 sprays into both nostrils 3 (three) times a week.     Marland Kitchen HYDROcodone-acetaminophen (NORCO) 7.5-325 MG tablet Take 1 tablet by mouth every 6 (six) hours as needed for moderate pain.    . meloxicam (MOBIC) 15 MG tablet Take 15 mg by mouth daily.    . metFORMIN (GLUCOPHAGE) 500 MG tablet Take 1 tablet (500 mg total) by mouth 2 (two) times daily with a meal. 60 tablet 0  . montelukast (SINGULAIR) 10 MG tablet Take 1 tablet (10 mg total) by mouth daily. 90 tablet 1  . Multiple Vitamins-Minerals (MULTI COMPLETE PO) Take 1 tablet by mouth daily.    . polyethylene glycol powder (GLYCOLAX/MIRALAX) powder Take 17  g by mouth daily. 3350 g 1  . rosuvastatin (CRESTOR) 10 MG tablet Take 1 tablet (10 mg total) by mouth at bedtime. 30 tablet 0  . Vitamin D, Ergocalciferol, (DRISDOL) 50000 units CAPS capsule Take 1 capsule (50,000 Units total) by mouth every 7 (seven) days. 4 capsule 0   No current facility-administered medications on file prior to visit.     PAST MEDICAL HISTORY: Past Medical History:  Diagnosis Date  . 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 Anemia    hx of  . Anxiety   . Arthritis    severe  . Constipation   . Depression   . GERD (gastroesophageal reflux disease)   . H/O Bell's palsy   . H/O hiatal hernia   .  Headache    occasional  . High triglycerides   . History of cancer chemotherapy   . Leukemia ([place])   . Osteopenia 06/08/2017   DEXA 2017 - T -1.8 spine  . Pneumonia    hx of   . Rheumatoid arthritis ([place])   . Sleep apnea   . Stroke ([place])    "mini stroke-caused bells palsy for 1 month"    PAST SURGICAL HISTORY: Past Surgical History:  Procedure Laterality Date  . ABDOMINAL HYSTERECTOMY  2001   heavy bleeding, precancerous cells, 1 ovary remains  . LAPAROSCOPIC GASTRIC BAND REMOVAL WITH LAPAROSCOPIC GASTRIC SLEEVE RESECTION  04/26/2014  . LAPAROSCOPIC GASTRIC BANDING  2000   performed in [place]  . TUBAL LIGATION  1996  . UPPER GI ENDOSCOPY N/A 04/26/2014   Procedure: UPPER GI ENDOSCOPY;  Surgeon: Pedro Earls, MD;  Location: [facility];  Service: General;  Laterality: N/A;    SOCIAL HISTORY: Social History   Tobacco Use  . Smoking status: Current Some Day Smoker    Years: 30.00    Types: Cigarettes  . Smokeless tobacco: Never Used  . Tobacco comment: Once every week or 2  Substance Use Topics  . Alcohol use: Yes    Comment: occasional  . Drug use: No    FAMILY HISTORY: Family History  Problem Relation Age of Onset  . Cancer Mother        cervix  . Hyperlipidemia Father   . Obesity Father   . CAD Father        MI and arrhythmia  . Cancer Sister        metastatic, ?pancrease primary  . Diabetes Maternal Grandfather     ROS: Review of Systems  Constitutional: Negative for weight loss.  Cardiovascular: Negative for chest pain and claudication.  Gastrointestinal: Negative for nausea.  Musculoskeletal: Negative for myalgias.  Endo/Heme/Allergies:       Negative polyphagia Negative hypoglycemia    PHYSICAL EXAM: Blood pressure 105/69, pulse 87, temperature 98.1 F (36.7 C), temperature source Oral, height 5\' 3"  (1.6 m), weight 233 lb (105.7 kg), SpO2 95 %. Body mass index is 41.27 kg/m. Physical Exam  Constitutional: She is oriented to person, place,  and time. She appears well-developed and well-nourished.  Cardiovascular: Normal rate.  Pulmonary/Chest: Effort normal.  Musculoskeletal: Normal range of motion.  Neurological: She is oriented to person, place, and time.  Skin: Skin is warm and dry.  Psychiatric: She has a normal mood and affect. Her behavior is normal.  Vitals reviewed.   RECENT LABS AND TESTS: BMET    Component Value Date/Time   NA 142 12/26/2017 0918   NA 144 09/04/2017 1055   NA  142 01/14/2013 1757   K 4.0 12/26/2017 0918   K 4.6 01/14/2013 1757   CL 106 12/26/2017 0918   CL 107 01/14/2013 1757   CO2 28 12/26/2017 0918   CO2 29 01/14/2013 1757   GLUCOSE 119 (H) 12/26/2017 0918   GLUCOSE 95 01/14/2013 1757   BUN 10 12/26/2017 0918   BUN 18 09/04/2017 1055   BUN 11 01/14/2013 1757   CREATININE 0.70 12/26/2017 0918   CREATININE 0.76 12/25/2016 1201   CALCIUM 9.3 12/26/2017 0918   CALCIUM 9.2 01/14/2013 1757   GFRNONAA 100 09/04/2017 1055   GFRNONAA 89 12/25/2016 1201   GFRAA 115 09/04/2017 1055   GFRAA >89 12/25/2016 1201   Lab Results  Component Value Date   HGBA1C 5.9 12/26/2017   HGBA1C 5.9 (H) 09/04/2017   HGBA1C 5.9 (H) 05/05/2017   HGBA1C 6.0 (H) 01/21/2017   HGBA1C 6.0 05/27/2016   Lab Results  Component Value Date   INSULIN 33.6 (H) 09/04/2017   INSULIN 26.0 (H) 05/05/2017   INSULIN 42.1 (H) 01/21/2017   CBC    Component Value Date/Time   WBC 5.7 10/21/2017 1430   RBC 4.27 10/21/2017 1430   HGB 14.3 10/21/2017 1430   HGB 14.5 09/04/2017 1055   HCT 41.8 10/21/2017 1430   HCT 42.8 09/04/2017 1055   PLT 191.0 10/21/2017 1430   PLT 214 06/20/2015 1141   MCV 97.9 10/21/2017 1430   MCV 98 (H) 09/04/2017 1055   MCV 96 01/15/2013 0522   MCH 33.0 09/04/2017 1055   MCH 32.6 08/27/2016 0904   MCHC 34.2 10/21/2017 1430   RDW 13.7 10/21/2017 1430   RDW 13.8 09/04/2017 1055   RDW 14.3 01/15/2013 0522   LYMPHSABS 2.5 10/21/2017 1430   LYMPHSABS 2.3 09/04/2017 1055   LYMPHSABS 3.6  01/15/2013 0522   MONOABS 0.6 10/21/2017 1430   MONOABS 0.8 01/15/2013 0522   EOSABS 0.2 10/21/2017 1430   EOSABS 0.2 09/04/2017 1055   EOSABS 0.3 01/15/2013 0522   BASOSABS 0.0 10/21/2017 1430   BASOSABS 0.0 09/04/2017 1055   BASOSABS 0.0 01/15/2013 0522   Iron/TIBC/Ferritin/ %Sat No results found for: IRON, TIBC, FERRITIN, IRONPCTSAT Lipid Panel     Component Value Date/Time   CHOL 153 12/26/2017 0918   CHOL 155 09/04/2017 1055   CHOL 190 01/15/2013 0522   TRIG 204.0 (H) 12/26/2017 0918   TRIG 307 (H) 01/15/2013 0522   HDL 57.70 12/26/2017 0918   HDL 57 09/04/2017 1055   HDL 44 01/15/2013 0522   CHOLHDL 3 12/26/2017 0918   VLDL 40.8 (H) 12/26/2017 0918   VLDL 61 (H) 01/15/2013 0522   LDLCALC 62 09/04/2017 1055   LDLCALC 85 01/15/2013 0522   LDLDIRECT 72.0 12/26/2017 0918   Hepatic Function Panel     Component Value Date/Time   PROT 6.8 12/26/2017 0918   PROT 7.3 09/04/2017 1055   PROT 7.7 01/14/2013 1757   ALBUMIN 4.1 12/26/2017 0918   ALBUMIN 4.7 09/04/2017 1055   ALBUMIN 3.8 01/14/2013 1757   AST 16 12/26/2017 0918   AST 25 01/14/2013 1757   ALT 18 12/26/2017 0918   ALT 39 01/14/2013 1757   ALKPHOS 73 12/26/2017 0918   ALKPHOS 100 01/14/2013 1757   BILITOT 0.4 12/26/2017 0918   BILITOT 0.3 09/04/2017 1055   BILITOT 0.3 01/14/2013 1757      Component Value Date/Time   TSH 3.850 01/21/2017 1128   TSH 2.09 12/25/2016 1201   TSH 3.163 03/01/2014 1201    ASSESSMENT  AND PLAN: Other hyperlipidemia - Plan: rosuvastatin (CRESTOR) 10 MG tablet  Prediabetes - Plan: metFORMIN (GLUCOPHAGE) 500 MG tablet  At risk for heart disease  Class 3 severe obesity with serious comorbidity and body mass index (BMI) of 40.0 to 44.9 in adult, unspecified obesity type ([place])  PLAN:  Hyperlipidemia Stacey Nichols was informed of the American Heart Association Guidelines emphasizing intensive lifestyle modifications as the first line treatment for hyperlipidemia. We discussed many  lifestyle modifications today in depth, and Stacey Nichols will continue to work on decreasing saturated fats such as fatty red meat, butter and many fried foods. She will also increase vegetables and lean protein in her diet and continue to work on exercise and weight loss efforts. Stacey Nichols agrees to continues taking Crestor 10 mg qhs #30 and we will refill for 1 month. Stacey Nichols agrees to follow up with our clinic in 5 weeks.  Cardiovascular risk counselling Stacey Nichols was given extended (15 minutes) coronary artery disease prevention counseling today. She is 57 y.o. female and has risk factors for heart disease including obesity and hyperlipidemia. We discussed intensive lifestyle modifications today with an emphasis on specific weight loss instructions and strategies. Pt was also informed of the importance of increasing exercise and decreasing saturated fats to help prevent heart disease.  Pre-Diabetes Stacey Nichols will continue to work on weight loss, exercise, and decreasing simple carbohydrates in her diet to help decrease the risk of diabetes. We dicussed metformin including benefits and risks. She was informed that eating too many simple carbohydrates or too many calories at one sitting increases the likelihood of GI side effects. Stacey Nichols agrees to continue taking metformin 500 mg BID #60 and we will refill for 1 month. Stacey Nichols agrees to follow up with our clinic in 5 weeks as directed to monitor her progress.  Obesity Stacey Nichols is currently in the action stage of change. As such, her goal is to continue with weight loss efforts She has agreed to follow the Pescatarian eating plan Stacey Nichols has been instructed to work up to a goal of 150 minutes of combined cardio and strengthening exercise per week for weight loss and overall health benefits. We discussed the following Behavioral Modification Strategies today: increasing lean protein intake and travel eating strategies    Stacey Nichols has agreed to follow up with our clinic in 5 weeks.  She was informed of the importance of frequent follow up visits to maximize her success with intensive lifestyle modifications for her multiple health conditions.   OBESITY BEHAVIORAL INTERVENTION VISIT  Today's visit was # 20 out of 22.  Starting weight: 246 lbs Starting date: 01/21/17 Today's weight : 233 lbs  Today's date: 01/08/2018 Total lbs lost to date: 13 (Patients must lose 7 lbs in the first 6 months to continue with counseling)   ASK: We discussed the diagnosis of obesity with Stacey Nichols today and Stacey Nichols agreed to give [place] permission to discuss obesity behavioral modification therapy today.  ASSESS: Stacey Nichols has the diagnosis of obesity and her BMI today is 41.28 Stacey Nichols is in the action stage of change   ADVISE: Stacey Nichols was educated on the multiple health risks of obesity as well as the benefit of weight loss to improve her health. She was advised of the need for long term treatment and the importance of lifestyle modifications.  AGREE: Multiple dietary modification options and treatment options were discussed and  Stacey Nichols agreed to the above obesity treatment plan.   Stacey Nichols, am acting as transcriptionist for [employer], PA-C I,  Lacy Duverney PAC, have reviewed this note and agree with its content

## 2018-01-27 ENCOUNTER — Encounter: Payer: BLUE CROSS/BLUE SHIELD | Admitting: Family Medicine

## 2018-02-05 ENCOUNTER — Ambulatory Visit (INDEPENDENT_AMBULATORY_CARE_PROVIDER_SITE_OTHER): Payer: BLUE CROSS/BLUE SHIELD | Admitting: Family Medicine

## 2018-02-05 VITALS — BP 101/70 | HR 87 | Temp 98.1°F | Ht 63.0 in | Wt 228.0 lb

## 2018-02-05 DIAGNOSIS — E559 Vitamin D deficiency, unspecified: Secondary | ICD-10-CM | POA: Diagnosis not present

## 2018-02-05 DIAGNOSIS — R7303 Prediabetes: Secondary | ICD-10-CM | POA: Diagnosis not present

## 2018-02-05 DIAGNOSIS — F3289 Other specified depressive episodes: Secondary | ICD-10-CM

## 2018-02-05 DIAGNOSIS — Z9189 Other specified personal risk factors, not elsewhere classified: Secondary | ICD-10-CM

## 2018-02-05 DIAGNOSIS — Z6841 Body Mass Index (BMI) 40.0 and over, adult: Secondary | ICD-10-CM

## 2018-02-06 LAB — VITAMIN D 25 HYDROXY (VIT D DEFICIENCY, FRACTURES): Vit D, 25-Hydroxy: 33 ng/mL (ref 30.0–100.0)

## 2018-02-09 NOTE — Progress Notes (Signed)
Office: [phone]  /  Fax: [phone]   HPI:   Chief Complaint: OBESITY Stacey Nichols is here to discuss her progress with her obesity treatment plan. She is on the Pescatarian eating plan and is following her eating plan approximately 50 % of the time. She states she is walking and doing physical therapy and exercising in the pool for 60 minutes 3 times per week. Stacey Nichols is trying to spread out meals, but she is still struggling with temptations of bread and sugars. Her weight is 228 lb (103.4 kg) today and has had a weight loss of 5 pounds over a period of 4 weeks since her last visit. She has lost 18 lbs since starting treatment with [place].  Vitamin D deficiency Stacey Nichols has a diagnosis of vitamin D deficiency. Her last level was five months ago. She is currently taking vit D and denies nausea, vomiting or muscle weakness.  At risk for osteopenia and osteoporosis Stacey Nichols is at higher risk of osteopenia and osteoporosis due to vitamin D deficiency.   Pre-Diabetes Stacey Nichols has a diagnosis of prediabetes based on her elevated Hgb A1c and was informed this puts her at greater risk of developing diabetes. She is taking metformin currently and continues to work on diet and exercise to decrease risk of diabetes. She denies any GI side effects of metformin. Stacey Nichols denies hypoglycemia.  Depression with emotional eating behaviors Stacey Nichols is having trouble sleeping and she also reports an increase in anger. Stacey Nichols struggles with emotional eating and using food for comfort to the extent that it is negatively impacting her health. She often snacks when she is not hungry. Stacey Nichols sometimes feels she is out of control and then feels guilty that she made poor food choices. She has been working on behavior modification techniques to help reduce her emotional eating and has been somewhat successful. She shows no sign of suicidal or homicidal ideations.  Depression screen [facility] 2/9 03/27/2017 01/21/2017 08/27/2016 06/13/2016  05/27/2016  Decreased Interest 0 3 0 0 0  Down, Depressed, Hopeless 0 3 0 0 0  PHQ - 2 Score 0 6 0 0 0  Altered sleeping - 3 - - -  Tired, decreased energy - 3 - - -  Change in appetite - 3 - - -  Feeling bad or failure about yourself  - 3 - - -  Trouble concentrating - 2 - - -  Moving slowly or fidgety/restless - 2 - - -  Suicidal thoughts - 1 - - -  PHQ-9 Score - 23 - - -     ALLERGIES: Allergies  Allergen Reactions  . Latex Other (See Comments) and Itching  . Sulfa Antibiotics Rash    [Rash] rash - paper tape OK  . Tape Rash    [Rash] rash - paper tape OK    MEDICATIONS: Current Outpatient Medications on File Prior to Visit  Medication Sig Dispense Refill  . acetaminophen (TYLENOL) 500 MG tablet Take 500 mg by mouth every 6 (six) hours as needed.    Marland Kitchen aspirin 325 MG tablet Take 325 mg by mouth daily.    . calcium carbonate (OS-CAL) 600 MG tablet Take 1 tablet (600 mg total) by mouth 2 (two) times daily with a meal. 90 tablet 3  . carbamazepine (TEGRETOL XR) 200 MG 12 hr tablet Take 200 mg by mouth daily.   2  . celecoxib (CELEBREX) 200 MG capsule Take 200 mg by mouth daily.    . Cyanocobalamin (VITAMIN B-12) 1000 MCG/15ML LIQD Take  500 mcg by mouth once a week.    . EPIPEN 2-PAK 0.3 MG/0.3ML SOAJ injection USE AS DIRECTED AS NEEDED  1  . ferrous sulfate 325 (65 FE) MG tablet Take 1 tablet (325 mg total) by mouth daily with breakfast. 30 tablet 0  . fluticasone (FLONASE) 50 MCG/ACT nasal spray Place 1-2 sprays into both nostrils 3 (three) times a week.     Marland Kitchen HYDROcodone-acetaminophen (NORCO) 7.5-325 MG tablet Take 1 tablet by mouth every 6 (six) hours as needed for moderate pain.    . meloxicam (MOBIC) 15 MG tablet Take 15 mg by mouth daily.    . metFORMIN (GLUCOPHAGE) 500 MG tablet Take 1 tablet (500 mg total) by mouth 2 (two) times daily with a meal. 60 tablet 0  . montelukast (SINGULAIR) 10 MG tablet Take 1 tablet (10 mg total) by mouth daily. 90 tablet 1  . Multiple  Vitamins-Minerals (MULTI COMPLETE PO) Take 1 tablet by mouth daily.    . polyethylene glycol powder (GLYCOLAX/MIRALAX) powder Take 17 g by mouth daily. 3350 g 1  . rosuvastatin (CRESTOR) 10 MG tablet Take 1 tablet (10 mg total) by mouth at bedtime. 30 tablet 0  . Vitamin D, Ergocalciferol, (DRISDOL) 50000 units CAPS capsule Take 1 capsule (50,000 Units total) by mouth every 7 (seven) days. 4 capsule 0   No current facility-administered medications on file prior to visit.     PAST MEDICAL HISTORY: Past Medical History:  Diagnosis Date  . 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 Anemia    hx of  . Anxiety   . Arthritis    severe  . Constipation   . Depression   . GERD (gastroesophageal reflux disease)   . H/O Bell's palsy   . H/O hiatal hernia   . Headache    occasional  . High triglycerides   . History of cancer chemotherapy   . Leukemia ([place])   . Osteopenia 06/08/2017   DEXA 2017 - T -1.8 spine  . Pneumonia    hx of   . Rheumatoid arthritis ([place])   . Sleep apnea   . Stroke ([place])    "mini stroke-caused bells palsy for 1 month"    PAST SURGICAL HISTORY: Past Surgical History:  Procedure Laterality Date  . ABDOMINAL HYSTERECTOMY  2001   heavy bleeding, precancerous cells, 1 ovary remains  . LAPAROSCOPIC GASTRIC BAND REMOVAL WITH LAPAROSCOPIC GASTRIC SLEEVE RESECTION  04/26/2014  . LAPAROSCOPIC GASTRIC BANDING  2000   performed in [place]  . TUBAL LIGATION  1996  . UPPER GI ENDOSCOPY N/A 04/26/2014   Procedure: UPPER GI ENDOSCOPY;  Surgeon: Pedro Earls, MD;  Location: [facility];  Service: General;  Laterality: N/A;    SOCIAL HISTORY: Social History   Tobacco Use  . Smoking status: Current Some Day Smoker    Years: 30.00    Types: Cigarettes  . Smokeless tobacco: Never Used  . Tobacco comment: Once every week or 2  Substance Use Topics  . Alcohol use: Yes    Comment: occasional  . Drug use: No    FAMILY HISTORY: Family  History  Problem Relation Age of Onset  . Cancer Mother        cervix  . Hyperlipidemia Father   . Obesity Father   . CAD Father        MI and arrhythmia  . Cancer Sister        metastatic, ?pancrease primary  . Diabetes  Maternal Grandfather     ROS: Review of Systems  Constitutional: Positive for weight loss.  Gastrointestinal: Negative for diarrhea, nausea and vomiting.  Musculoskeletal:       Negative for muscle weakness  Endo/Heme/Allergies:       Negative for hypoglycemia  Psychiatric/Behavioral: Positive for depression. Negative for suicidal ideas. The patient has insomnia.        Positive for anger    PHYSICAL EXAM: Blood pressure 101/70, pulse 87, temperature 98.1 F (36.7 C), temperature source Oral, height 5\' 3"  (1.6 m), weight 228 lb (103.4 kg), SpO2 95 %. Body mass index is 40.39 kg/m. Physical Exam  Constitutional: She is oriented to person, place, and time. She appears well-developed and well-nourished.  Cardiovascular: Normal rate.  Pulmonary/Chest: Effort normal.  Musculoskeletal: Normal range of motion.  Neurological: She is oriented to person, place, and time.  Skin: Skin is warm and dry.  Psychiatric: Her behavior is normal. She exhibits a depressed mood.  Vitals reviewed.   RECENT LABS AND TESTS: BMET    Component Value Date/Time   NA 142 12/26/2017 0918   NA 144 09/04/2017 1055   NA 142 01/14/2013 1757   K 4.0 12/26/2017 0918   K 4.6 01/14/2013 1757   CL 106 12/26/2017 0918   CL 107 01/14/2013 1757   CO2 28 12/26/2017 0918   CO2 29 01/14/2013 1757   GLUCOSE 119 (H) 12/26/2017 0918   GLUCOSE 95 01/14/2013 1757   BUN 10 12/26/2017 0918   BUN 18 09/04/2017 1055   BUN 11 01/14/2013 1757   CREATININE 0.70 12/26/2017 0918   CREATININE 0.76 12/25/2016 1201   CALCIUM 9.3 12/26/2017 0918   CALCIUM 9.2 01/14/2013 1757   GFRNONAA 100 09/04/2017 1055   GFRNONAA 89 12/25/2016 1201   GFRAA 115 09/04/2017 1055   GFRAA >89 12/25/2016 1201   Lab  Results  Component Value Date   HGBA1C 5.9 12/26/2017   HGBA1C 5.9 (H) 09/04/2017   HGBA1C 5.9 (H) 05/05/2017   HGBA1C 6.0 (H) 01/21/2017   HGBA1C 6.0 05/27/2016   Lab Results  Component Value Date   INSULIN 33.6 (H) 09/04/2017   INSULIN 26.0 (H) 05/05/2017   INSULIN 42.1 (H) 01/21/2017   CBC    Component Value Date/Time   WBC 5.7 10/21/2017 1430   RBC 4.27 10/21/2017 1430   HGB 14.3 10/21/2017 1430   HGB 14.5 09/04/2017 1055   HCT 41.8 10/21/2017 1430   HCT 42.8 09/04/2017 1055   PLT 191.0 10/21/2017 1430   PLT 214 06/20/2015 1141   MCV 97.9 10/21/2017 1430   MCV 98 (H) 09/04/2017 1055   MCV 96 01/15/2013 0522   MCH 33.0 09/04/2017 1055   MCH 32.6 08/27/2016 0904   MCHC 34.2 10/21/2017 1430   RDW 13.7 10/21/2017 1430   RDW 13.8 09/04/2017 1055   RDW 14.3 01/15/2013 0522   LYMPHSABS 2.5 10/21/2017 1430   LYMPHSABS 2.3 09/04/2017 1055   LYMPHSABS 3.6 01/15/2013 0522   MONOABS 0.6 10/21/2017 1430   MONOABS 0.8 01/15/2013 0522   EOSABS 0.2 10/21/2017 1430   EOSABS 0.2 09/04/2017 1055   EOSABS 0.3 01/15/2013 0522   BASOSABS 0.0 10/21/2017 1430   BASOSABS 0.0 09/04/2017 1055   BASOSABS 0.0 01/15/2013 0522   Iron/TIBC/Ferritin/ %Sat No results found for: IRON, TIBC, FERRITIN, IRONPCTSAT Lipid Panel     Component Value Date/Time   CHOL 153 12/26/2017 0918   CHOL 155 09/04/2017 1055   CHOL 190 01/15/2013 0522   TRIG 204.0 (H)  12/26/2017 0918   TRIG 307 (H) 01/15/2013 0522   HDL 57.70 12/26/2017 0918   HDL 57 09/04/2017 1055   HDL 44 01/15/2013 0522   CHOLHDL 3 12/26/2017 0918   VLDL 40.8 (H) 12/26/2017 0918   VLDL 61 (H) 01/15/2013 0522   LDLCALC 62 09/04/2017 1055   LDLCALC 85 01/15/2013 0522   LDLDIRECT 72.0 12/26/2017 0918   Hepatic Function Panel     Component Value Date/Time   PROT 6.8 12/26/2017 0918   PROT 7.3 09/04/2017 1055   PROT 7.7 01/14/2013 1757   ALBUMIN 4.1 12/26/2017 0918   ALBUMIN 4.7 09/04/2017 1055   ALBUMIN 3.8 01/14/2013 1757    AST 16 12/26/2017 0918   AST 25 01/14/2013 1757   ALT 18 12/26/2017 0918   ALT 39 01/14/2013 1757   ALKPHOS 73 12/26/2017 0918   ALKPHOS 100 01/14/2013 1757   BILITOT 0.4 12/26/2017 0918   BILITOT 0.3 09/04/2017 1055   BILITOT 0.3 01/14/2013 1757      Component Value Date/Time   TSH 3.850 01/21/2017 1128   TSH 2.09 12/25/2016 1201   TSH 3.163 03/01/2014 1201   Results for KAYANA, THOEN (MRN [id number]) as of 02/09/2018 09:37  Ref. Range 02/05/2018 11:41  Vitamin D, 25-Hydroxy Latest Ref Range: 30.0 - 100.0 ng/mL 33.0   ASSESSMENT AND PLAN: Prediabetes  Vitamin D deficiency - Plan: VITAMIN D 25 Hydroxy (Vit-D Deficiency, Fractures)  Other depression - with emotional eating  At risk for osteoporosis  Class 3 severe obesity with serious comorbidity and body mass index (BMI) of 40.0 to 44.9 in adult, unspecified obesity type ([place])  PLAN:  Vitamin D Deficiency Sadye was informed that low vitamin D levels contributes to fatigue and are associated with obesity, breast, and colon cancer. She agrees to continue to take prescription Vit D @50 ,000 IU every week and will follow up for routine testing of vitamin D, at least 2-3 times per year. She was informed of the risk of over-replacement of vitamin D and agrees to not increase her dose unless she discusses this with [place] first. We will check vitamin D level today.  At risk for osteopenia and osteoporosis Stacey Nichols is at risk for osteopenia and osteoporosis due to her vitamin D deficiency. She was encouraged to take her vitamin D and follow her higher calcium diet and increase strengthening exercise to help strengthen her bones and decrease her risk of osteopenia and osteoporosis.  Pre-Diabetes Stacey Nichols will continue to work on weight loss, exercise, and decreasing simple carbohydrates in her diet to help decrease the risk of diabetes. We dicussed metformin including benefits and risks. She was informed that eating too many simple carbohydrates  or too many calories at one sitting increases the likelihood of GI side effects. Stacey Nichols will continue metformin for now and a prescription was not written today. Stacey Nichols to follow up with [place] as directed to monitor her progress.  Depression with Emotional Eating Behaviors We discussed behavior modification techniques today to help Stacey Nichols deal with her emotional eating and depression. She has Nichols to stop Wellbutrin SR 150 mg qd and follow up as directed.  Obesity Stacey Nichols is currently in the action stage of change. As such, her goal is to continue with weight loss efforts She has Nichols to follow our protein rich vegetarian plan +300 calories Stacey Nichols has been instructed to work up to a goal of 150 minutes of combined cardio and strengthening exercise per week for weight loss and overall health benefits. We discussed  the following Behavioral Modification Strategies today: planning for success, increasing lean protein intake, increasing vegetables and work on meal planning and easy cooking plans  Stacey Nichols has Nichols to follow up with our clinic in 2 weeks. She was informed of the importance of frequent follow up visits to maximize her success with intensive lifestyle modifications for her multiple health conditions.   OBESITY BEHAVIORAL INTERVENTION VISIT  Today's visit was # 21 out of 22.  Starting weight: 246 lbs Starting date: 01/21/17 Today's weight : 228 lbs Today's date: 02/05/2018 Total lbs lost to date: 72    ASK: We discussed the diagnosis of obesity with Stacey Nichols today and Stacey Nichols Nichols to give [place] permission to discuss obesity behavioral modification therapy today.  ASSESS: Shilee has the diagnosis of obesity and her BMI today is 40.4 Cally is in the action stage of change   ADVISE: Keshawna was educated on the multiple health risks of obesity as well as the benefit of weight loss to improve her health. She was advised of the need for long term treatment and the importance of  lifestyle modifications.  AGREE: Multiple dietary modification options and treatment options were discussed and  Jenai Nichols to the above obesity treatment plan.  I, Doreene Nest, am acting as transcriptionist for Eber Jones, MD  I have reviewed the above documentation for accuracy and completeness, and I agree with the above. - Ilene Qua, MD

## 2018-02-18 ENCOUNTER — Ambulatory Visit (INDEPENDENT_AMBULATORY_CARE_PROVIDER_SITE_OTHER): Payer: BLUE CROSS/BLUE SHIELD | Admitting: Physician Assistant

## 2018-02-18 VITALS — BP 148/98 | HR 65 | Temp 97.9°F | Ht 63.0 in | Wt 230.0 lb

## 2018-02-18 DIAGNOSIS — E785 Hyperlipidemia, unspecified: Secondary | ICD-10-CM | POA: Diagnosis not present

## 2018-02-18 DIAGNOSIS — Z9189 Other specified personal risk factors, not elsewhere classified: Secondary | ICD-10-CM

## 2018-02-18 DIAGNOSIS — Z6841 Body Mass Index (BMI) 40.0 and over, adult: Secondary | ICD-10-CM | POA: Diagnosis not present

## 2018-02-18 DIAGNOSIS — E7849 Other hyperlipidemia: Secondary | ICD-10-CM

## 2018-02-18 DIAGNOSIS — R7303 Prediabetes: Secondary | ICD-10-CM

## 2018-02-18 MED ORDER — ROSUVASTATIN CALCIUM 10 MG PO TABS: 10.0000 mg | ORAL_TABLET | Freq: Every day | ORAL | 0 refills | Status: DC

## 2018-02-18 MED ORDER — METFORMIN HCL 500 MG PO TABS: 500.0000 mg | ORAL_TABLET | Freq: Two times a day (BID) | ORAL | 0 refills | Status: DC

## 2018-02-19 NOTE — Progress Notes (Signed)
Office: [phone]  /  Fax: [phone]   HPI:   Chief Complaint: OBESITY Stacey Nichols is here to discuss her progress with her obesity treatment plan. She is on the protein rich vegetarian plan + 300 calories and is following her eating plan approximately 60 % of the time. She states she is walking and bike riding for 15 minutes 5 times per week. Stacey Nichols reports that she has been deviating from meal plan and eating a lot of fruit. She has started exercising more. She is ready to get back on track with eating. She is going out of town to visit family for a few weeks.  Her weight is 230 lb (104.3 kg) today and has gained 2 pounds since her last visit. She has lost 16 lbs since starting treatment with [place].  Pre-Diabetes Stacey Nichols has a diagnosis of pre-diabetes based on her elevated Hgb A1c and was informed this puts her at greater risk of developing diabetes. She is taking metformin currently and continues to work on diet and exercise to decrease risk of diabetes. She denies hypoglycemia, nausea, vomiting, diarrhea, or polyphagia.  At risk for diabetes Stacey Nichols is at higher than average risk for developing diabetes due to her obesity and pre-diabetes. She currently denies polyuria or polydipsia.  Hyperlipidemia Stacey Nichols has hyperlipidemia and has been trying to improve her cholesterol levels with intensive lifestyle modification including a low saturated fat diet, exercise and weight loss. She is currently on rosuvastatin and denies any chest pain, claudication or myalgias.  ALLERGIES: Allergies  Allergen Reactions  . Latex Other (See Comments) and Itching  . Sulfa Antibiotics Rash    [Rash] rash - paper tape OK  . Tape Rash    [Rash] rash - paper tape OK    MEDICATIONS: Current Outpatient Medications on File Prior to Visit  Medication Sig Dispense Refill  . acetaminophen (TYLENOL) 500 MG tablet Take 500 mg by mouth every 6 (six) hours as needed.    . carbamazepine (TEGRETOL XR) 200 MG 12 hr  tablet Take 200 mg by mouth daily.   2  . Multiple Vitamins-Minerals (MULTI COMPLETE PO) Take 1 tablet by mouth daily.    . Vitamin D, Ergocalciferol, (DRISDOL) 50000 units CAPS capsule Take 1 capsule (50,000 Units total) by mouth every 7 (seven) days. 4 capsule 0   No current facility-administered medications on file prior to visit.     PAST MEDICAL HISTORY: Past Medical History:  Diagnosis Date  . 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 Anemia    hx of  . Anxiety   . Arthritis    severe  . Constipation   . Depression   . GERD (gastroesophageal reflux disease)   . H/O Bell's palsy   . H/O hiatal hernia   . Headache    occasional  . High triglycerides   . History of cancer chemotherapy   . Leukemia ([place])   . Osteopenia 06/08/2017   DEXA 2017 - T -1.8 spine  . Pneumonia    hx of   . Rheumatoid arthritis ([place])   . Sleep apnea   . Stroke ([place])    "mini stroke-caused bells palsy for 1 month"    PAST SURGICAL HISTORY: Past Surgical History:  Procedure Laterality Date  . ABDOMINAL HYSTERECTOMY  2001   heavy bleeding, precancerous cells, 1 ovary remains  . LAPAROSCOPIC GASTRIC BAND REMOVAL WITH LAPAROSCOPIC GASTRIC SLEEVE RESECTION  04/26/2014  . [place] GASTRIC BANDING  2000  performed in [place]  . TUBAL LIGATION  1996  . UPPER GI ENDOSCOPY N/A 04/26/2014   Procedure: UPPER GI ENDOSCOPY;  Surgeon: Pedro Earls, MD;  Location: [facility];  Service: General;  Laterality: N/A;    SOCIAL HISTORY: Social History   Tobacco Use  . Smoking status: Current Some Day Smoker    Years: 30.00    Types: Cigarettes  . Smokeless tobacco: Never Used  . Tobacco comment: Once every week or 2  Substance Use Topics  . Alcohol use: Yes    Comment: occasional  . Drug use: No    FAMILY HISTORY: Family History  Problem Relation Age of Onset  . Cancer Mother        cervix  . Hyperlipidemia Father   . Obesity Father   . CAD Father        MI  and arrhythmia  . Cancer Sister        metastatic, ?pancrease primary  . Diabetes Maternal Grandfather     ROS: Review of Systems  Constitutional: Negative for weight loss.  Cardiovascular: Negative for chest pain and claudication.  Gastrointestinal: Negative for diarrhea, nausea and vomiting.  Genitourinary: Negative for frequency.  Musculoskeletal: Negative for myalgias.  Endo/Heme/Allergies: Negative for polydipsia.       Negative hypoglycemia Negative polyphagia    PHYSICAL EXAM: Blood pressure (!) 148/98, pulse 65, temperature 97.9 F (36.6 C), temperature source Oral, height 5\' 3"  (1.6 m), weight 230 lb (104.3 kg), SpO2 97 %. Body mass index is 40.74 kg/m. Physical Exam  Constitutional: She is oriented to person, place, and time. She appears well-developed and well-nourished.  Cardiovascular: Normal rate.  Pulmonary/Chest: Effort normal.  Musculoskeletal: Normal range of motion.  Neurological: She is oriented to person, place, and time.  Skin: Skin is warm and dry.  Psychiatric: She has a normal mood and affect. Her behavior is normal.  Vitals reviewed.   RECENT LABS AND TESTS: BMET    Component Value Date/Time   NA 142 12/26/2017 0918   NA 144 09/04/2017 1055   NA 142 01/14/2013 1757   K 4.0 12/26/2017 0918   K 4.6 01/14/2013 1757   CL 106 12/26/2017 0918   CL 107 01/14/2013 1757   CO2 28 12/26/2017 0918   CO2 29 01/14/2013 1757   GLUCOSE 119 (H) 12/26/2017 0918   GLUCOSE 95 01/14/2013 1757   BUN 10 12/26/2017 0918   BUN 18 09/04/2017 1055   BUN 11 01/14/2013 1757   CREATININE 0.70 12/26/2017 0918   CREATININE 0.76 12/25/2016 1201   CALCIUM 9.3 12/26/2017 0918   CALCIUM 9.2 01/14/2013 1757   GFRNONAA 100 09/04/2017 1055   GFRNONAA 89 12/25/2016 1201   GFRAA 115 09/04/2017 1055   GFRAA >89 12/25/2016 1201   Lab Results  Component Value Date   HGBA1C 5.9 12/26/2017   HGBA1C 5.9 (H) 09/04/2017   HGBA1C 5.9 (H) 05/05/2017   HGBA1C 6.0 (H)  01/21/2017   HGBA1C 6.0 05/27/2016   Lab Results  Component Value Date   INSULIN 33.6 (H) 09/04/2017   INSULIN 26.0 (H) 05/05/2017   INSULIN 42.1 (H) 01/21/2017   CBC    Component Value Date/Time   WBC 5.7 10/21/2017 1430   RBC 4.27 10/21/2017 1430   HGB 14.3 10/21/2017 1430   HGB 14.5 09/04/2017 1055   HCT 41.8 10/21/2017 1430   HCT 42.8 09/04/2017 1055   PLT 191.0 10/21/2017 1430   PLT 214 06/20/2015 1141   MCV 97.9 10/21/2017 1430  MCV 98 (H) 09/04/2017 1055   MCV 96 01/15/2013 0522   MCH 33.0 09/04/2017 1055   MCH 32.6 08/27/2016 0904   MCHC 34.2 10/21/2017 1430   RDW 13.7 10/21/2017 1430   RDW 13.8 09/04/2017 1055   RDW 14.3 01/15/2013 0522   LYMPHSABS 2.5 10/21/2017 1430   LYMPHSABS 2.3 09/04/2017 1055   LYMPHSABS 3.6 01/15/2013 0522   MONOABS 0.6 10/21/2017 1430   MONOABS 0.8 01/15/2013 0522   EOSABS 0.2 10/21/2017 1430   EOSABS 0.2 09/04/2017 1055   EOSABS 0.3 01/15/2013 0522   BASOSABS 0.0 10/21/2017 1430   BASOSABS 0.0 09/04/2017 1055   BASOSABS 0.0 01/15/2013 0522   Iron/TIBC/Ferritin/ %Sat No results found for: IRON, TIBC, FERRITIN, IRONPCTSAT Lipid Panel     Component Value Date/Time   CHOL 153 12/26/2017 0918   CHOL 155 09/04/2017 1055   CHOL 190 01/15/2013 0522   TRIG 204.0 (H) 12/26/2017 0918   TRIG 307 (H) 01/15/2013 0522   HDL 57.70 12/26/2017 0918   HDL 57 09/04/2017 1055   HDL 44 01/15/2013 0522   CHOLHDL 3 12/26/2017 0918   VLDL 40.8 (H) 12/26/2017 0918   VLDL 61 (H) 01/15/2013 0522   LDLCALC 62 09/04/2017 1055   LDLCALC 85 01/15/2013 0522   LDLDIRECT 72.0 12/26/2017 0918   Hepatic Function Panel     Component Value Date/Time   PROT 6.8 12/26/2017 0918   PROT 7.3 09/04/2017 1055   PROT 7.7 01/14/2013 1757   ALBUMIN 4.1 12/26/2017 0918   ALBUMIN 4.7 09/04/2017 1055   ALBUMIN 3.8 01/14/2013 1757   AST 16 12/26/2017 0918   AST 25 01/14/2013 1757   ALT 18 12/26/2017 0918   ALT 39 01/14/2013 1757   ALKPHOS 73 12/26/2017 0918     ALKPHOS 100 01/14/2013 1757   BILITOT 0.4 12/26/2017 0918   BILITOT 0.3 09/04/2017 1055   BILITOT 0.3 01/14/2013 1757      Component Value Date/Time   TSH 3.850 01/21/2017 1128   TSH 2.09 12/25/2016 1201   TSH 3.163 03/01/2014 1201    ASSESSMENT AND PLAN: Prediabetes - Plan: metFORMIN (GLUCOPHAGE) 500 MG tablet  Hyperlipidemia, unspecified hyperlipidemia type  At risk for diabetes mellitus  Class 3 severe obesity with serious comorbidity and body mass index (BMI) of 40.0 to 44.9 in adult, unspecified obesity type ([place])  Other hyperlipidemia - Plan: rosuvastatin (CRESTOR) 10 MG tablet  PLAN:  Pre-Diabetes Stacey Nichols will continue to work on weight loss, exercise, and decreasing simple carbohydrates in her diet to help decrease the risk of diabetes. We dicussed metformin including benefits and risks. She was informed that eating too many simple carbohydrates or too many calories at one sitting increases the likelihood of GI side effects. Stacey Nichols agrees to continue taking metformin 500 mg BID #60 and we will refill for 1 month. Stacey Nichols agrees to follow up with our clinic in 4 weeks as directed to monitor her progress.  Diabetes risk counselling Stacey Nichols was given extended (15 minutes) diabetes prevention counseling today. She is 57 y.o. female and has risk factors for diabetes including obesity and pre-diabetes. We discussed intensive lifestyle modifications today with an emphasis on weight loss as well as increasing exercise and decreasing simple carbohydrates in her diet.  Hyperlipidemia Stacey Nichols was informed of the American Heart Association Guidelines emphasizing intensive lifestyle modifications as the first line treatment for hyperlipidemia. We discussed many lifestyle modifications today in depth, and Stacey Nichols will continue to work on decreasing saturated fats such as fatty red meat, butter  and many fried foods. She will also increase vegetables and lean protein in her diet and continue to work  on exercise and weight loss efforts. Stacey Nichols agrees to continue taking rosuvastatin 10 mg qhs #30 and we will refill for 1 month.  Obesity Stacey Nichols is currently in the action stage of change. As such, her goal is to continue with weight loss efforts She has agreed to follow the Pescatarian eating plan Stacey Nichols has been instructed to work up to a goal of 150 minutes of combined cardio and strengthening exercise per week for weight loss and overall health benefits. We discussed the following Behavioral Modification Strategies today: increasing lean protein intake and work on meal planning and easy cooking plans   Stacey Nichols has agreed to follow up with our clinic in 4 weeks. She was informed of the importance of frequent follow up visits to maximize her success with intensive lifestyle modifications for her multiple health conditions.   OBESITY BEHAVIORAL INTERVENTION VISIT  Today's visit was # 22 out of 22.  Starting weight: 246 lbs Starting date: 01/21/17 Today's weight : 230 lbs  Today's date: 02/18/2018 Total lbs lost to date: 30    ASK: We discussed the diagnosis of obesity with Stacey Nichols today and [place] agreed to give [place] permission to discuss obesity behavioral modification therapy today.  ASSESS: Sham has the diagnosis of obesity and her BMI today is 40.76 Stacey Nichols is in the action stage of change   ADVISE: Stacey Nichols was educated on the multiple health risks of obesity as well as the benefit of weight loss to improve her health. She was advised of the need for long term treatment and the importance of lifestyle modifications.  AGREE: Multiple dietary modification options and treatment options were discussed and  Stacey Nichols agreed to the above obesity treatment plan.  Stacey Nichols Durie, am acting as transcriptionist for Abby Potash, PA-C I, Abby Potash, PA-C have reviewed above note and agree with its content

## 2018-03-03 ENCOUNTER — Ambulatory Visit (INDEPENDENT_AMBULATORY_CARE_PROVIDER_SITE_OTHER): Payer: BLUE CROSS/BLUE SHIELD | Admitting: Family Medicine

## 2018-03-03 ENCOUNTER — Encounter: Payer: Self-pay | Admitting: Family Medicine

## 2018-03-03 VITALS — BP 120/70 | HR 84 | Temp 98.4°F | Ht 63.0 in | Wt 237.0 lb

## 2018-03-03 DIAGNOSIS — R7303 Prediabetes: Secondary | ICD-10-CM

## 2018-03-03 DIAGNOSIS — Z87898 Personal history of other specified conditions: Secondary | ICD-10-CM

## 2018-03-03 DIAGNOSIS — K760 Fatty (change of) liver, not elsewhere classified: Secondary | ICD-10-CM

## 2018-03-03 DIAGNOSIS — Z0001 Encounter for general adult medical examination with abnormal findings: Secondary | ICD-10-CM | POA: Insufficient documentation

## 2018-03-03 DIAGNOSIS — M1711 Unilateral primary osteoarthritis, right knee: Secondary | ICD-10-CM

## 2018-03-03 DIAGNOSIS — Z8742 Personal history of other diseases of the female genital tract: Secondary | ICD-10-CM

## 2018-03-03 DIAGNOSIS — C9201 Acute myeloblastic leukemia, in remission: Secondary | ICD-10-CM

## 2018-03-03 DIAGNOSIS — G500 Trigeminal neuralgia: Secondary | ICD-10-CM

## 2018-03-03 DIAGNOSIS — M170 Bilateral primary osteoarthritis of knee: Secondary | ICD-10-CM

## 2018-03-03 DIAGNOSIS — Z9884 Bariatric surgery status: Secondary | ICD-10-CM

## 2018-03-03 DIAGNOSIS — E559 Vitamin D deficiency, unspecified: Secondary | ICD-10-CM

## 2018-03-03 DIAGNOSIS — Z0000 Encounter for general adult medical examination without abnormal findings: Secondary | ICD-10-CM | POA: Insufficient documentation

## 2018-03-03 DIAGNOSIS — M8580 Other specified disorders of bone density and structure, unspecified site: Secondary | ICD-10-CM

## 2018-03-03 DIAGNOSIS — E785 Hyperlipidemia, unspecified: Secondary | ICD-10-CM

## 2018-03-03 MED ORDER — ROSUVASTATIN CALCIUM 10 MG PO TABS: 10.0000 mg | ORAL_TABLET | Freq: Every day | ORAL | 3 refills | Status: DC

## 2018-03-03 MED ORDER — METHYLPREDNISOLONE ACETATE 80 MG/ML IJ SUSP
80.0000 mg | Freq: Once | INTRAMUSCULAR | Status: AC
  Administered 2018-03-03: 80 mg via INTRAMUSCULAR

## 2018-03-03 NOTE — Assessment & Plan Note (Signed)
Off tegretol for last few months - ran out. Has f/u planned with neurology.

## 2018-03-03 NOTE — Assessment & Plan Note (Signed)
Preventative protocols reviewed and updated unless pt declined. Discussed healthy diet and lifestyle.  

## 2018-03-03 NOTE — Assessment & Plan Note (Signed)
Chronic, stable on crestor. Continue per pt preference The ASCVD Risk score Stacey Bussing DC Jr., Stacey al., Stacey Nichols) failed to calculate for the following reasons:   The patient has a prior MI or stroke diagnosis

## 2018-03-03 NOTE — Assessment & Plan Note (Signed)
Continues weekly replacement.

## 2018-03-03 NOTE — Assessment & Plan Note (Signed)
Endorses h/o this. LFTs stable. Anticipate HS.

## 2018-03-03 NOTE — Assessment & Plan Note (Signed)
Chronic, stable on metformin through [facility].

## 2018-03-03 NOTE — Assessment & Plan Note (Signed)
S/p hysterectomy, continues undergoing Q29yr paps - [place]BGYN in [place]

## 2018-03-03 NOTE — Assessment & Plan Note (Signed)
Continues routine onc f/u - may have been released from care last year (10 yr remission mark)

## 2018-03-03 NOTE — Assessment & Plan Note (Addendum)
Steroid injection performed today and pt tolerated well. Reviewed signs of infection necessitating emergent care.

## 2018-03-03 NOTE — Assessment & Plan Note (Signed)
Continue calcium/vit D, reviewed weight bearing exercises - currently limited by knee arthritis pain.

## 2018-03-03 NOTE — Patient Instructions (Addendum)
Sign release of information form for colonoscopy 2015 in [facility] Sign release for records of latest mammogram (Dr Lissa Merlin in [facility]) [place].  Inyeccion de rodilla derecha hoy.  Regresar en [place] Maintenance, Female Adopting a healthy lifestyle and getting preventive care can go a long way to promote health and wellness. Talk with your health care provider about what schedule of regular examinations is right for you. This is a good chance for you to check in with your provider about disease prevention and staying healthy. In between checkups, there are plenty of things you can do on your own. Experts have done a lot of research about which lifestyle changes and preventive measures are most likely to keep you healthy. Ask your health care provider for more information. Weight and diet Eat a healthy diet  Be sure to include plenty of vegetables, fruits, low-fat dairy products, and lean protein.  Do not eat a lot of foods high in solid fats, added sugars, or salt.  Get regular exercise. This is one of the most important things you can do for your health. ? Most adults should exercise for at least 150 minutes each week. The exercise should increase your heart rate and make you sweat (moderate-intensity exercise). ? Most adults should also do strengthening exercises at least twice a week. This is in addition to the moderate-intensity exercise.  Maintain a healthy weight  Body mass index (BMI) is a measurement that can be used to identify possible weight problems. It estimates body fat based on height and weight. Your health care provider can help determine your BMI and help you achieve or maintain a healthy weight.  For females 47 years of age and older: ? A BMI below 18.5 is considered underweight. ? A BMI of 18.5 to 24.9 is normal. ? A BMI of 25 to 29.9 is considered overweight. ? A BMI of 30 and above is considered obese.  Watch levels of  cholesterol and blood lipids  You should start having your blood tested for lipids and cholesterol at 56 years of age, then have this test every 5 years.  You may need to have your cholesterol levels checked more often if: ? Your lipid or cholesterol levels are high. ? You are older than 56 years of age. ? You are at high risk for heart disease.  Cancer screening Lung Cancer  Lung cancer screening is recommended for adults 83-25 years old who are at high risk for lung cancer because of a history of smoking.  A yearly low-dose CT scan of the lungs is recommended for people who: ? Currently smoke. ? Have quit within the past 15 years. ? Have at least a 30-pack-year history of smoking. A pack year is smoking an average of one pack of cigarettes a day for 1 year.  Yearly screening should continue until it has been 15 years since you quit.  Yearly screening should stop if you develop a health problem that would prevent you from having lung cancer treatment.  Breast Cancer  Practice breast self-awareness. This means understanding how your breasts normally appear and feel.  It also means doing regular breast self-exams. Let your health care provider know about any changes, no matter how small.  If you are in your 20s or 30s, you should have a clinical breast exam (CBE) by a health care provider every 1-3 years as part of a regular health exam.  If you are 40 or older,  have a CBE every year. Also consider having a breast X-ray (mammogram) every year.  If you have a family history of breast cancer, talk to your health care provider about genetic screening.  If you are at high risk for breast cancer, talk to your health care provider about having an MRI and a mammogram every year.  Breast cancer gene (BRCA) assessment is recommended for women who have family members with BRCA-related cancers. BRCA-related cancers include: ? Breast. ? Ovarian. ? Tubal. ? Peritoneal cancers.  Results  of the assessment will determine the need for genetic counseling and BRCA1 and BRCA2 testing.  Cervical Cancer Your health care provider may recommend that you be screened regularly for cancer of the pelvic organs (ovaries, uterus, and vagina). This screening involves a pelvic examination, including checking for microscopic changes to the surface of your cervix (Pap test). You may be encouraged to have this screening done every 3 years, beginning at age 72.  For women ages 59-65, health care providers may recommend pelvic exams and Pap testing every 3 years, or they may recommend the Pap and pelvic exam, combined with testing for human papilloma virus (HPV), every 5 years. Some types of HPV increase your risk of cervical cancer. Testing for HPV may also be done on women of any age with unclear Pap test results.  Other health care providers may not recommend any screening for nonpregnant women who are considered low risk for pelvic cancer and who do not have symptoms. Ask your health care provider if a screening pelvic exam is right for you.  If you have had past treatment for cervical cancer or a condition that could lead to cancer, you need Pap tests and screening for cancer for at least 20 years after your treatment. If Pap tests have been discontinued, your risk factors (such as having a new sexual partner) need to be reassessed to determine if screening should resume. Some women have medical problems that increase the chance of getting cervical cancer. In these cases, your health care provider may recommend more frequent screening and Pap tests.  Colorectal Cancer  This type of cancer can be detected and often prevented.  Routine colorectal cancer screening usually begins at 56 years of age and continues through 56 years of age.  Your health care provider may recommend screening at an earlier age if you have risk factors for colon cancer.  Your health care provider may also recommend using  home test kits to check for hidden blood in the stool.  A small camera at the end of a tube can be used to examine your colon directly (sigmoidoscopy or colonoscopy). This is done to check for the earliest forms of colorectal cancer.  Routine screening usually begins at age 71.  Direct examination of the colon should be repeated every 5-10 years through 56 years of age. However, you may need to be screened more often if early forms of precancerous polyps or small growths are found.  Skin Cancer  Check your skin from head to toe regularly.  Tell your health care provider about any new moles or changes in moles, especially if there is a change in a mole's shape or color.  Also tell your health care provider if you have a mole that is larger than the size of a pencil eraser.  Always use sunscreen. Apply sunscreen liberally and repeatedly throughout the day.  Protect yourself by wearing long sleeves, pants, a wide-brimmed hat, and sunglasses whenever you are outside.  Heart disease, diabetes, and high blood pressure  High blood pressure causes heart disease and increases the risk of stroke. High blood pressure is more likely to develop in: ? People who have blood pressure in the high end of the normal range (130-139/85-89 mm Hg). ? People who are overweight or obese. ? People who are African American.  If you are 13-66 years of age, have your blood pressure checked every 3-5 years. If you are 65 years of age or older, have your blood pressure checked every year. You should have your blood pressure measured twice-once when you are at a hospital or clinic, and once when you are not at a hospital or clinic. Record the average of the two measurements. To check your blood pressure when you are not at a hospital or clinic, you can use: ? An automated blood pressure machine at a pharmacy. ? A home blood pressure monitor.  If you are between 3 years and 60 years old, ask your health care provider  if you should take aspirin to prevent strokes.  Have regular diabetes screenings. This involves taking a blood sample to check your fasting blood sugar level. ? If you are at a normal weight and have a low risk for diabetes, have this test once every three years after 56 years of age. ? If you are overweight and have a high risk for diabetes, consider being tested at a younger age or more often. Preventing infection Hepatitis B  If you have a higher risk for hepatitis B, you should be screened for this virus. You are considered at high risk for hepatitis B if: ? You were born in a country where hepatitis B is common. Ask your health care provider which countries are considered high risk. ? Your parents were born in a high-risk country, and you have not been immunized against hepatitis B (hepatitis B vaccine). ? You have HIV or AIDS. ? You use needles to inject street drugs. ? You live with someone who has hepatitis B. ? You have had sex with someone who has hepatitis B. ? You get hemodialysis treatment. ? You take certain medicines for conditions, including cancer, organ transplantation, and autoimmune conditions.  Hepatitis C  Blood testing is recommended for: ? Everyone born from [birth year] through 1964. ? Anyone with known risk factors for hepatitis C.  Sexually transmitted infections (STIs)  You should be screened for sexually transmitted infections (STIs) including gonorrhea and chlamydia if: ? You are sexually active and are younger than 56 years of age. ? You are older than 56 years of age and your health care provider tells you that you are at risk for this type of infection. ? Your sexual activity has changed since you were last screened and you are at an increased risk for chlamydia or gonorrhea. Ask your health care provider if you are at risk.  If you do not have HIV, but are at risk, it may be recommended that you take a prescription medicine daily to prevent HIV infection. This  is called pre-exposure prophylaxis (PrEP). You are considered at risk if: ? You are sexually active and do not regularly use condoms or know the HIV status of your partner(s). ? You take drugs by injection. ? You are sexually active with a partner who has HIV.  Talk with your health care provider about whether you are at high risk of being infected with HIV. If you choose to begin PrEP, you should first be tested for HIV.  You should then be tested every 3 months for as long as you are taking PrEP. Pregnancy  If you are premenopausal and you may become pregnant, ask your health care provider about preconception counseling.  If you may become pregnant, take 400 to 800 micrograms (mcg) of folic acid every day.  If you want to prevent pregnancy, talk to your health care provider about birth control (contraception). Osteoporosis and menopause  Osteoporosis is a disease in which the bones lose minerals and strength with aging. This can result in serious bone fractures. Your risk for osteoporosis can be identified using a bone density scan.  If you are 11 years of age or older, or if you are at risk for osteoporosis and fractures, ask your health care provider if you should be screened.  Ask your health care provider whether you should take a calcium or vitamin D supplement to lower your risk for osteoporosis.  Menopause may have certain physical symptoms and risks.  Hormone replacement therapy may reduce some of these symptoms and risks. Talk to your health care provider about whether hormone replacement therapy is right for you. Follow these instructions at home:  Schedule regular health, dental, and eye exams.  Stay current with your immunizations.  Do not use any tobacco products including cigarettes, chewing tobacco, or electronic cigarettes.  If you are pregnant, do not drink alcohol.  If you are breastfeeding, limit how much and how often you drink alcohol.  Limit alcohol intake  to no more than 1 drink per day for nonpregnant women. One drink equals 12 ounces of beer, 5 ounces of wine, or 1 ounces of hard liquor.  Do not use street drugs.  Do not share needles.  Ask your health care provider for help if you need support or information about quitting drugs.  Tell your health care provider if you often feel depressed.  Tell your health care provider if you have ever been abused or do not feel safe at home. This information is not intended to replace advice given to you by your health care provider. Make sure you discuss any questions you have with your health care provider. Document Released: 01/14/2011 Document Revised: 12/07/2015 Document Reviewed: 04/04/2015 Elsevier Interactive Patient Education  [employer].

## 2018-03-03 NOTE — Progress Notes (Signed)
BP 120/70 (BP Location: Right Arm, Patient Position: Sitting, Cuff Size: Large)   Pulse 84   Temp 98.4 F (36.9 C) (Oral)   Ht 5\' 3"  (1.6 m)   Wt 237 lb (107.5 kg)   SpO2 95%   BMI 41.98 kg/m    CC: CPE Subjective:    Patient ID: Stacey Nichols, female    DOB: March 17, 1961, 57 y.o.   MRN: [id number]  HPI: Stacey Nichols is a 57 y.o. 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 Upcoming R knee surgery 06/2018. Sees [facility] Dr Vickii Chafe. Requests R knee injection today to last in interim. Has had prior knee injections and tolerated well.   Followed by healthy weight and wellness on metformin for prediabetes. Also known fatty liver disease. Also on crestor for this. H/o "mini-CVA" around bell's palsy. Endorses 38 lb weight loss since starting treatment.  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   Preventative: Colon cancer screening - colonoscopy ~2015, rec rpt 5 yrs (in [place]) Well woman exam - s/p hysterectomy 2001 precancerous cells, 1 ovary remains. Last well woman ~2017 ([place]). Q11yrs Breast cancer screening - mammogram 10/2016 [facility]). Pt states normal 10/2017 as well.  DEXA 12/2015 - osteopenia (T -1.8 Lspine) Flu shot yearly Seat belt use discussed Sunscreen use discussed. No changing moles on skin. Smoking - very intermittent Alcohol - seldom Dentist Q60mo  Eye exam - yearly  From [place] Lives alone  Separated from husband after leukemia  Occ: retired, was Armed forces operational officer  Edu: 2 yrs college Activity: no regular exercise  Diet: good ater, fruits/vegetables daily   Relevant past medical, surgical, family and social history reviewed and updated as indicated. Interim medical history since our last visit reviewed. Allergies and medications reviewed and updated. Outpatient Medications  Prior to Visit  Medication Sig Dispense Refill  . acetaminophen (TYLENOL) 500 MG tablet Take 500 mg by mouth every 6 (six) hours as needed.    . Calcium Citrate (CALCITRATE PO) Take 1 tablet by mouth daily.    . metFORMIN (GLUCOPHAGE) 500 MG tablet Take 1 tablet (500 mg total) by mouth 2 (two) times daily with a meal. 60 tablet 0  . Multiple Vitamins-Minerals (MULTI COMPLETE PO) Take 1 tablet by mouth daily.    . vitamin B-12 (CYANOCOBALAMIN) 1000 MCG tablet Take 1,000 mcg by mouth daily.    . Vitamin D, Ergocalciferol, (DRISDOL) 50000 units CAPS capsule Take 1 capsule (50,000 Units total) by mouth every 7 (seven) days. 4 capsule 0  . rosuvastatin (CRESTOR) 10 MG tablet Take 1 tablet (10 mg total) by mouth at bedtime. 30 tablet 0  . carbamazepine (TEGRETOL XR) 200 MG 12 hr tablet Take 200 mg by mouth daily.   2   No facility-administered medications prior to visit.      Per HPI unless specifically indicated in ROS section below Review of Systems  Constitutional: Negative for activity change, appetite change, chills, fatigue, fever and unexpected weight change.  HENT: Negative for hearing loss.   Eyes: Negative for visual disturbance.  Respiratory: Negative for cough, chest tightness, shortness of breath and wheezing.   Cardiovascular: Positive for leg swelling (after knee surgery). Negative for chest pain and palpitations.  Gastrointestinal: Positive for abdominal pain (intermittent RUQ, last 3 months ago). Negative  for abdominal distention, blood in stool, constipation, diarrhea, nausea and vomiting.  Genitourinary: Negative for difficulty urinating and hematuria.  Musculoskeletal: Negative for arthralgias, myalgias and neck pain.  Skin: Negative for rash.  Neurological: Negative for dizziness, seizures, syncope and headaches.  Hematological: Negative for adenopathy. Does not bruise/bleed easily.  Psychiatric/Behavioral: Negative for dysphoric mood. The patient is not nervous/anxious.         Objective:    BP 120/70 (BP Location: Right Arm, Patient Position: Sitting, Cuff Size: Large)   Pulse 84   Temp 98.4 F (36.9 C) (Oral)   Ht 5\' 3"  (1.6 m)   Wt 237 lb (107.5 kg)   SpO2 95%   BMI 41.98 kg/m   Wt Readings from Last 3 Encounters:  03/03/18 237 lb (107.5 kg)  02/18/18 230 lb (104.3 kg)  02/05/18 228 lb (103.4 kg)    Physical Exam  Constitutional: She is oriented to person, place, and time. She appears well-developed and well-nourished. No distress.  HENT:  Head: Normocephalic and atraumatic.  Right Ear: Hearing, tympanic membrane, external ear and ear canal normal.  Left Ear: Hearing, tympanic membrane, external ear and ear canal normal.  Nose: Nose normal.  Mouth/Throat: Uvula is midline, oropharynx is clear and moist and mucous membranes are normal. No oropharyngeal exudate, posterior oropharyngeal edema or posterior oropharyngeal erythema.  Eyes: Pupils are equal, round, and reactive to light. Conjunctivae and EOM are normal. No scleral icterus.  Neck: Normal range of motion. Neck supple. No thyromegaly present.  Cardiovascular: Normal rate, regular rhythm, normal heart sounds and intact distal pulses.  No murmur heard. Pulses:      Radial pulses are 2+ on the right side, and 2+ on the left side.  Pulmonary/Chest: Effort normal and breath sounds normal. No respiratory distress. She has no wheezes. She has no rales.  Abdominal: Soft. Bowel sounds are normal. She exhibits no distension and no mass. There is no tenderness. There is no rebound and no guarding.  Musculoskeletal: Normal range of motion. She exhibits no edema.  Crepitus with flexion/extension bilateral knees No effusion or swelling appreciated No erythema or warmth of R knee joint L knee midline incision  Lymphadenopathy:    She has no cervical adenopathy.  Neurological: She is alert and oriented to person, place, and time.  CN grossly intact, station and gait intact  Skin: Skin is warm and  dry. No rash noted.  Psychiatric: She has a normal mood and affect. Her behavior is normal. Judgment and thought content normal.  Nursing note and vitals reviewed.  Results for orders placed or performed in visit on 02/05/18  VITAMIN D 25 Hydroxy (Vit-D Deficiency, Fractures)  Result Value Ref Range   Vit D, 25-Hydroxy 33.0 30.0 - 100.0 ng/mL   Lab Results  Component Value Date   HGBA1C 5.9 12/26/2017    R knee steroid injection IC obtained and in chart. Landmarks palpated. Skin cleaned with alcohol wipes. Anesthesia achieved with ethyl chloride. Using lateral inferior approach with knee flexed, 1cc depo medrol steroid (80mg ) and 4 cc lidocaine 1% injected into knee joint using 22g 1.5" needle.  Pt tolerated well. Post procedure instructions provided     Assessment & Plan:   Problem List Items Addressed This Visit    Vitamin D deficiency    Continues weekly replacement.       Trigeminal neuralgia    Off tegretol for last few months - ran out. Has f/u planned with neurology.      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 Reviewed signs of infection necessitating emergent care.       Relevant Medications   methylPREDNISolone acetate (DEPO-MEDROL) injection 80 mg (Completed)   Primary osteoarthritis of both knees    S/p L TKR 10/2017 planned R TKR 06/2018      Relevant Medications   methylPREDNISolone acetate (DEPO-MEDROL) injection 80 mg (Completed)   Prediabetes    Chronic, stable on metformin through [facility].      Osteopenia    Continue calcium/vit D, reviewed weight bearing exercises - currently limited by knee arthritis pain.       NAFLD (nonalcoholic fatty liver disease)    Endorses h/o this. LFTs stable. Anticipate HS.       History of abnormal cervical Pap smear    S/p hysterectomy, continues undergoing Q17yr paps  - sees OBGYN in [place]      Health maintenance examination - Primary    Preventative protocols reviewed and updated unless pt declined. Discussed healthy diet and lifestyle.       Dyslipidemia    Chronic, stable on crestor. Continue per pt preference The ASCVD Risk score Mikey Bussing DC Jr., et al., 2013) failed to calculate for the following reasons:   The patient has a prior MI or stroke diagnosis       Relevant Medications   rosuvastatin (CRESTOR) 10 MG tablet   AML (acute myeloid leukemia) in remission ([place])    Continues routine onc f/u - may have been released from care last year (10 yr remission mark)      Relevant Medications   methylPREDNISolone acetate (DEPO-MEDROL) injection 80 mg (Completed)       Meds ordered this encounter  Medications  . rosuvastatin (CRESTOR) 10 MG tablet    Sig: Take 1 tablet (10 mg total) by mouth at bedtime.    Dispense:  90 tablet    Refill:  3  . methylPREDNISolone acetate (DEPO-MEDROL) injection 80 mg   No orders of the defined types were placed in this encounter.   Follow up plan: Return in about 6 months (around 09/03/2018) for follow up visit.  Ria Bush, MD

## 2018-03-03 NOTE — Assessment & Plan Note (Signed)
S/p L TKR 10/2017 planned R TKR 06/2018

## 2018-03-17 ENCOUNTER — Encounter: Payer: Self-pay | Admitting: Family Medicine

## 2018-03-25 ENCOUNTER — Ambulatory Visit (INDEPENDENT_AMBULATORY_CARE_PROVIDER_SITE_OTHER): Payer: BLUE CROSS/BLUE SHIELD | Admitting: Physician Assistant

## 2018-04-06 ENCOUNTER — Ambulatory Visit (INDEPENDENT_AMBULATORY_CARE_PROVIDER_SITE_OTHER): Payer: BLUE CROSS/BLUE SHIELD | Admitting: Physician Assistant

## 2018-04-06 VITALS — BP 104/72 | HR 72 | Temp 97.8°F | Ht 63.0 in | Wt 234.0 lb

## 2018-04-06 DIAGNOSIS — E559 Vitamin D deficiency, unspecified: Secondary | ICD-10-CM | POA: Diagnosis not present

## 2018-04-06 DIAGNOSIS — Z6841 Body Mass Index (BMI) 40.0 and over, adult: Secondary | ICD-10-CM

## 2018-04-06 DIAGNOSIS — Z9189 Other specified personal risk factors, not elsewhere classified: Secondary | ICD-10-CM

## 2018-04-06 DIAGNOSIS — R7303 Prediabetes: Secondary | ICD-10-CM

## 2018-04-06 MED ORDER — VITAMIN D (ERGOCALCIFEROL) 1.25 MG (50000 UNIT) PO CAPS: 50000.0000 [IU] | ORAL_CAPSULE | ORAL | 0 refills | Status: DC

## 2018-04-06 MED ORDER — METFORMIN HCL 500 MG PO TABS: 500.0000 mg | ORAL_TABLET | Freq: Two times a day (BID) | ORAL | 0 refills | Status: DC

## 2018-04-07 NOTE — Progress Notes (Signed)
Office: [phone]  /  Fax: [phone]   HPI:   Chief Complaint: OBESITY Stacey Nichols is here to discuss her progress with her obesity treatment plan. She is on the Pescatarian eating plan and is following her eating plan approximately 40 % of the time. She states she is bicycling for 20 minutes 3 times per week. Stacey Nichols reports being on vacation in [place] and falling off of her plan. She is ready to get back on track. She also reports wanting to start water aerobics.  Her weight is 234 lb (106.1 kg) today and has gained 4 pounds since her last visit. She has lost 12 lbs since starting treatment with [place].  Vitamin D Deficiency Stacey Nichols has a diagnosis of vitamin D deficiency. She is on prescription Vit D and denies nausea, vomiting or muscle weakness.  Pre-Diabetes Stacey Nichols has a diagnosis of pre-diabetes based on her elevated Hgb A1c and was informed this puts her at greater risk of developing diabetes. She is taking metformin currently and continues to work on diet and exercise to decrease risk of diabetes. She denies polyphagia or hypoglycemia.  At risk for diabetes Stacey Nichols is at higher than average risk for developing diabetes due to her obesity and pre-diabetes. She currently denies polyuria or polydipsia.  ALLERGIES: Allergies  Allergen Reactions  . Latex Other (See Comments) and Itching  . Sulfa Antibiotics Rash    [Rash] rash - paper tape OK  . Tape Rash    [Rash] rash - paper tape OK    MEDICATIONS: Current Outpatient Medications on File Prior to Visit  Medication Sig Dispense Refill  . acetaminophen (TYLENOL) 500 MG tablet Take 500 mg by mouth every 6 (six) hours as needed.    . Calcium Citrate (CALCITRATE PO) Take 1 tablet by mouth daily.    . carbamazepine (TEGRETOL XR) 200 MG 12 hr tablet Take 200 mg by mouth daily.   2  . Multiple Vitamins-Minerals (MULTI COMPLETE PO) Take 1 tablet by mouth daily.    . rosuvastatin (CRESTOR) 10 MG tablet Take 1 tablet (10 mg total) by mouth at  bedtime. 90 tablet 3  . vitamin B-12 (CYANOCOBALAMIN) 1000 MCG tablet Take 1,000 mcg by mouth daily.     No current facility-administered medications on file prior to visit.     PAST MEDICAL HISTORY: Past Medical History:  Diagnosis Date  . 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 Anemia    hx of  . Anxiety   . Arthritis    severe  . Constipation   . Depression   . GERD (gastroesophageal reflux disease)   . H/O Bell's palsy   . H/O hiatal hernia   . Headache    occasional  . High triglycerides   . History of cancer chemotherapy   . Leukemia ([place])   . Osteopenia 06/08/2017   DEXA 2017 - T -1.8 spine  . Pneumonia    hx of   . Rheumatoid arthritis ([place])   . Sleep apnea   . Stroke ([place])    "mini stroke-caused bells palsy for 1 month"    PAST SURGICAL HISTORY: Past Surgical History:  Procedure Laterality Date  . ABDOMINAL HYSTERECTOMY  2001   heavy bleeding, precancerous cells, 1 ovary remains  . LAPAROSCOPIC GASTRIC BAND REMOVAL WITH LAPAROSCOPIC GASTRIC SLEEVE RESECTION  04/26/2014  . LAPAROSCOPIC GASTRIC BANDING  2000   performed in [place]  . TUBAL LIGATION  1996  . UPPER GI ENDOSCOPY N/A 04/26/2014  Procedure: UPPER GI ENDOSCOPY;  Surgeon: Pedro Earls, MD;  Location: [facility];  Service: General;  Laterality: N/A;    SOCIAL HISTORY: Social History   Tobacco Use  . Smoking status: Current Some Day Smoker    Years: 30.00    Types: Cigarettes  . Smokeless tobacco: Never Used  . Tobacco comment: Once every week or 2  Substance Use Topics  . Alcohol use: Yes    Comment: occasional  . Drug use: No    FAMILY HISTORY: Family History  Problem Relation Age of Onset  . Cancer Mother        cervix  . Hyperlipidemia Father   . Obesity Father   . CAD Father        MI and arrhythmia  . Cancer Sister        metastatic, ?pancrease primary  . Diabetes Maternal Grandfather     ROS: Review of Systems  Constitutional: Negative  for weight loss.  Gastrointestinal: Negative for nausea and vomiting.  Genitourinary: Negative for frequency.  Musculoskeletal:       Negative muscle weakness  Endo/Heme/Allergies: Negative for polydipsia.       Negative polyphagia    PHYSICAL EXAM: Blood pressure 104/72, pulse 72, temperature 97.8 F (36.6 C), temperature source Oral, height 5\' 3"  (1.6 m), weight 234 lb (106.1 kg), SpO2 96 %. Body mass index is 41.45 kg/m. Physical Exam  Constitutional: She is oriented to person, place, and time. She appears well-developed and well-nourished.  Cardiovascular: Normal rate.  Pulmonary/Chest: Effort normal.  Musculoskeletal: Normal range of motion.  Neurological: She is oriented to person, place, and time.  Skin: Skin is warm and dry.  Psychiatric: She has a normal mood and affect. Her behavior is normal.  Vitals reviewed.   RECENT LABS AND TESTS: BMET    Component Value Date/Time   NA 142 12/26/2017 0918   NA 144 09/04/2017 1055   NA 142 01/14/2013 1757   K 4.0 12/26/2017 0918   K 4.6 01/14/2013 1757   CL 106 12/26/2017 0918   CL 107 01/14/2013 1757   CO2 28 12/26/2017 0918   CO2 29 01/14/2013 1757   GLUCOSE 119 (H) 12/26/2017 0918   GLUCOSE 95 01/14/2013 1757   BUN 10 12/26/2017 0918   BUN 18 09/04/2017 1055   BUN 11 01/14/2013 1757   CREATININE 0.70 12/26/2017 0918   CREATININE 0.76 12/25/2016 1201   CALCIUM 9.3 12/26/2017 0918   CALCIUM 9.2 01/14/2013 1757   GFRNONAA 100 09/04/2017 1055   GFRNONAA 89 12/25/2016 1201   GFRAA 115 09/04/2017 1055   GFRAA >89 12/25/2016 1201   Lab Results  Component Value Date   HGBA1C 5.9 12/26/2017   HGBA1C 5.9 (H) 09/04/2017   HGBA1C 5.9 (H) 05/05/2017   HGBA1C 6.0 (H) 01/21/2017   HGBA1C 6.0 05/27/2016   Lab Results  Component Value Date   INSULIN 33.6 (H) 09/04/2017   INSULIN 26.0 (H) 05/05/2017   INSULIN 42.1 (H) 01/21/2017   CBC    Component Value Date/Time   WBC 5.7 10/21/2017 1430   RBC 4.27 10/21/2017 1430    HGB 14.3 10/21/2017 1430   HGB 14.5 09/04/2017 1055   HCT 41.8 10/21/2017 1430   HCT 42.8 09/04/2017 1055   PLT 191.0 10/21/2017 1430   PLT 214 06/20/2015 1141   MCV 97.9 10/21/2017 1430   MCV 98 (H) 09/04/2017 1055   MCV 96 01/15/2013 0522   MCH 33.0 09/04/2017 1055   MCH 32.6 08/27/2016 0904  MCHC 34.2 10/21/2017 1430   RDW 13.7 10/21/2017 1430   RDW 13.8 09/04/2017 1055   RDW 14.3 01/15/2013 0522   LYMPHSABS 2.5 10/21/2017 1430   LYMPHSABS 2.3 09/04/2017 1055   LYMPHSABS 3.6 01/15/2013 0522   MONOABS 0.6 10/21/2017 1430   MONOABS 0.8 01/15/2013 0522   EOSABS 0.2 10/21/2017 1430   EOSABS 0.2 09/04/2017 1055   EOSABS 0.3 01/15/2013 0522   BASOSABS 0.0 10/21/2017 1430   BASOSABS 0.0 09/04/2017 1055   BASOSABS 0.0 01/15/2013 0522   Iron/TIBC/Ferritin/ %Sat No results found for: IRON, TIBC, FERRITIN, IRONPCTSAT Lipid Panel     Component Value Date/Time   CHOL 153 12/26/2017 0918   CHOL 155 09/04/2017 1055   CHOL 190 01/15/2013 0522   TRIG 204.0 (H) 12/26/2017 0918   TRIG 307 (H) 01/15/2013 0522   HDL 57.70 12/26/2017 0918   HDL 57 09/04/2017 1055   HDL 44 01/15/2013 0522   CHOLHDL 3 12/26/2017 0918   VLDL 40.8 (H) 12/26/2017 0918   VLDL 61 (H) 01/15/2013 0522   LDLCALC 62 09/04/2017 1055   LDLCALC 85 01/15/2013 0522   LDLDIRECT 72.0 12/26/2017 0918   Hepatic Function Panel     Component Value Date/Time   PROT 6.8 12/26/2017 0918   PROT 7.3 09/04/2017 1055   PROT 7.7 01/14/2013 1757   ALBUMIN 4.1 12/26/2017 0918   ALBUMIN 4.7 09/04/2017 1055   ALBUMIN 3.8 01/14/2013 1757   AST 16 12/26/2017 0918   AST 25 01/14/2013 1757   ALT 18 12/26/2017 0918   ALT 39 01/14/2013 1757   ALKPHOS 73 12/26/2017 0918   ALKPHOS 100 01/14/2013 1757   BILITOT 0.4 12/26/2017 0918   BILITOT 0.3 09/04/2017 1055   BILITOT 0.3 01/14/2013 1757      Component Value Date/Time   TSH 3.850 01/21/2017 1128   TSH 2.09 12/25/2016 1201   TSH 3.163 03/01/2014 1201  Results for  LORANDA, MASTEL (MRN [id number]) as of 04/07/2018 09:41  Ref. Range 02/05/2018 11:41  Vitamin D, 25-Hydroxy Latest Ref Range: 30.0 - 100.0 ng/mL 33.0    ASSESSMENT AND PLAN: Vitamin D deficiency - Plan: Vitamin D, Ergocalciferol, (DRISDOL) 50000 units CAPS capsule  Prediabetes - Plan: metFORMIN (GLUCOPHAGE) 500 MG tablet  At risk for diabetes mellitus  Class 3 severe obesity with serious comorbidity and body mass index (BMI) of 40.0 to 44.9 in adult, unspecified obesity type ([place])  PLAN:  Vitamin D Deficiency Stacey Nichols was informed that low vitamin D levels contributes to fatigue and are associated with obesity, breast, and colon cancer. Arlenis agrees to continue taking prescription Vit D @50 ,000 IU every week #4 and we will refill for 1 month. She will follow up for routine testing of vitamin D, at least 2-3 times per year. She was informed of the risk of over-replacement of vitamin D and agrees to not increase her dose unless she discusses this with [place] first. Stacey Nichols agrees to follow up with our clinic in 2 weeks.  Pre-Diabetes Stacey Nichols will continue to work on weight loss, exercise, and decreasing simple carbohydrates in her diet to help decrease the risk of diabetes. We dicussed metformin including benefits and risks. She was informed that eating too many simple carbohydrates or too many calories at one sitting increases the likelihood of GI side effects. Stacey Nichols agrees to continue taking metformin 500 mg BID #60 and we will refill for 1 month. Stacey Nichols agrees to follow up with our clinic in 2 weeks as directed to monitor her progress.  Diabetes risk counselling Stacey Nichols was given extended (15 minutes) diabetes prevention counseling today. She is 57 y.o. female and has risk factors for diabetes including obesity and pre-diabetes. We discussed intensive lifestyle modifications today with an emphasis on weight loss as well as increasing exercise and decreasing simple carbohydrates in her diet.  Obesity Stacey Nichols  is currently in the action stage of change. As such, her goal is to continue with weight loss efforts She has agreed to follow the Pescatarian eating plan + 300 calories Stacey Nichols has been instructed to work up to a goal of 150 minutes of combined cardio and strengthening exercise per week for weight loss and overall health benefits. We discussed the following Behavioral Modification Strategies today: work on meal planning and easy cooking plans and no skipping meals   Stacey Nichols has agreed to follow up with our clinic in 2 weeks. She was informed of the importance of frequent follow up visits to maximize her success with intensive lifestyle modifications for her multiple health conditions.   OBESITY BEHAVIORAL INTERVENTION VISIT  Today's visit was # 23  Starting weight: 246 lbs Starting date: 01/21/17 Today's weight : 234 lbs Today's date: 04/06/2018 Total lbs lost to date: 12    ASK: We discussed the diagnosis of obesity with Stacey Nichols today and Stacey Nichols agreed to give [place] permission to discuss obesity behavioral modification therapy today.  ASSESS: Stacey Nichols has the diagnosis of obesity and her BMI today is 41.46 Stacey Nichols is in the action stage of change   ADVISE: Stacey Nichols was educated on the multiple health risks of obesity as well as the benefit of weight loss to improve her health. She was advised of the need for long term treatment and the importance of lifestyle modifications.  AGREE: Multiple dietary modification options and treatment options were discussed and  Stacey Nichols agreed to the above obesity treatment plan.  Wilhemena Durie, am acting as transcriptionist for Abby Potash, PA-C I, Abby Potash, PA-C have reviewed above note and agree with its content

## 2018-04-23 ENCOUNTER — Encounter (INDEPENDENT_AMBULATORY_CARE_PROVIDER_SITE_OTHER): Payer: Self-pay | Admitting: Physician Assistant

## 2018-04-23 ENCOUNTER — Ambulatory Visit (INDEPENDENT_AMBULATORY_CARE_PROVIDER_SITE_OTHER): Payer: BLUE CROSS/BLUE SHIELD | Admitting: Physician Assistant

## 2018-04-23 VITALS — BP 106/72 | HR 78 | Temp 98.4°F | Ht 63.0 in | Wt 234.0 lb

## 2018-04-23 DIAGNOSIS — E559 Vitamin D deficiency, unspecified: Secondary | ICD-10-CM

## 2018-04-23 DIAGNOSIS — Z6841 Body Mass Index (BMI) 40.0 and over, adult: Secondary | ICD-10-CM

## 2018-04-23 DIAGNOSIS — Z9189 Other specified personal risk factors, not elsewhere classified: Secondary | ICD-10-CM | POA: Diagnosis not present

## 2018-04-23 DIAGNOSIS — R7303 Prediabetes: Secondary | ICD-10-CM | POA: Diagnosis not present

## 2018-04-23 MED ORDER — METFORMIN HCL 500 MG PO TABS: 500.0000 mg | ORAL_TABLET | Freq: Two times a day (BID) | ORAL | 0 refills | Status: DC

## 2018-04-28 NOTE — Progress Notes (Signed)
Office: [phone]  /  Fax: [phone]   HPI:   Chief Complaint: OBESITY Stacey Nichols is here to discuss her progress with her obesity treatment plan. She is on the Pescatarian eating plan + 300 calories and is following her eating plan approximately 50 % of the time. She states she is walking for 20 minutes 7 times per week. Stacey Nichols reports that she has been eating tacos and tortillas with her family at night. She is not getting enough protein at breakfast or dinner.  Her weight is 234 lb (106.1 kg) today and has not lost weight since her last visit. She has lost 12 lbs since starting treatment with [place].  Vitamin D Deficiency Stacey Nichols has a diagnosis of vitamin D deficiency. She is currently taking prescription Vit D and denies nausea, vomiting or muscle weakness.  Pre-Diabetes Stacey Nichols has a diagnosis of pre-diabetes based on her elevated Hgb A1c and was informed this puts her at greater risk of developing diabetes. She denies nausea, vomiting, or diarrhea on metformin and continues to work on diet and exercise to decrease risk of diabetes. She denies hypoglycemia.  At risk for diabetes Stacey Nichols is at higher than average risk for developing diabetes due to her obesity and pre-diabetes. She currently denies polyuria or polydipsia.  ALLERGIES: Allergies  Allergen Reactions  . Latex Other (See Comments) and Itching  . Sulfa Antibiotics Rash    [Rash] rash - paper tape OK  . Tape Rash    [Rash] rash - paper tape OK    MEDICATIONS: Current Outpatient Medications on File Prior to Visit  Medication Sig Dispense Refill  . acetaminophen (TYLENOL) 500 MG tablet Take 500 mg by mouth every 6 (six) hours as needed.    . Calcium Citrate (CALCITRATE PO) Take 1 tablet by mouth daily.    . Multiple Vitamins-Minerals (MULTI COMPLETE PO) Take 1 tablet by mouth daily.    . naproxen (NAPROSYN) 500 MG tablet Take 500 mg by mouth daily at 12 noon.    . rosuvastatin (CRESTOR) 10 MG tablet Take 1 tablet (10 mg  total) by mouth at bedtime. 90 tablet 3  . vitamin B-12 (CYANOCOBALAMIN) 1000 MCG tablet Take 1,000 mcg by mouth daily.    . Vitamin D, Ergocalciferol, (DRISDOL) 50000 units CAPS capsule Take 1 capsule (50,000 Units total) by mouth every 7 (seven) days. 4 capsule 0   No current facility-administered medications on file prior to visit.     PAST MEDICAL HISTORY: Past Medical History:  Diagnosis Date  . AML (acute myeloblastic leukemia) ([place]) 2007   s/p chemo (Dr Lissa Merlin)  . Anemia    hx of  . Anxiety   . Arthritis    severe  . Constipation   . Depression   . GERD (gastroesophageal reflux disease)   . H/O Bell's palsy   . H/O hiatal hernia   . Headache    occasional  . High triglycerides   . History of cancer chemotherapy   . Leukemia ([place])   . Osteopenia 06/08/2017   DEXA 2017 - T -1.8 spine  . Pneumonia    hx of   . Rheumatoid arthritis ([place])   . Sleep apnea   . Stroke ([place])    "mini stroke-caused bells palsy for 1 month"    PAST SURGICAL HISTORY: Past Surgical History:  Procedure Laterality Date  . ABDOMINAL HYSTERECTOMY  2001   heavy bleeding, precancerous cells, 1 ovary remains  . LAPAROSCOPIC GASTRIC BAND REMOVAL WITH LAPAROSCOPIC GASTRIC SLEEVE RESECTION  04/26/2014  .  LAPAROSCOPIC GASTRIC BANDING  2000   performed in [place]  . TUBAL LIGATION  1996  . UPPER GI ENDOSCOPY N/A 04/26/2014   Procedure: UPPER GI ENDOSCOPY;  Surgeon: Pedro Earls, MD;  Location: [facility];  Service: General;  Laterality: N/A;    SOCIAL HISTORY: Social History   Tobacco Use  . Smoking status: Current Some Day Smoker    Years: 30.00    Types: Cigarettes  . Smokeless tobacco: Never Used  . Tobacco comment: Once every week or 2  Substance Use Topics  . Alcohol use: Yes    Comment: occasional  . Drug use: No    FAMILY HISTORY: Family History  Problem Relation Age of Onset  . Cancer Mother        cervix  . Hyperlipidemia Father   . Obesity Father   . CAD Father        MI  and arrhythmia  . Cancer Sister        metastatic, ?pancrease primary  . Diabetes Maternal Grandfather     ROS: Review of Systems  Constitutional: Negative for weight loss.  Gastrointestinal: Negative for diarrhea, nausea and vomiting.  Genitourinary: Negative for frequency.  Musculoskeletal:       Negative muscle weakness  Endo/Heme/Allergies: Negative for polydipsia.       Negative hypoglycemia    PHYSICAL EXAM: Blood pressure 106/72, pulse 78, temperature 98.4 F (36.9 C), temperature source Oral, height 5\' 3"  (1.6 m), weight 234 lb (106.1 kg), SpO2 95 %. Body mass index is 41.45 kg/m. Physical Exam  Constitutional: She is oriented to person, place, and time. She appears well-developed and well-nourished.  Cardiovascular: Normal rate.  Pulmonary/Chest: Effort normal.  Musculoskeletal: Normal range of motion.  Neurological: She is oriented to person, place, and time.  Skin: Skin is warm and dry.  Psychiatric: She has a normal mood and affect. Her behavior is normal.  Vitals reviewed.   RECENT LABS AND TESTS: BMET    Component Value Date/Time   NA 142 12/26/2017 0918   NA 144 09/04/2017 1055   NA 142 01/14/2013 1757   K 4.0 12/26/2017 0918   K 4.6 01/14/2013 1757   CL 106 12/26/2017 0918   CL 107 01/14/2013 1757   CO2 28 12/26/2017 0918   CO2 29 01/14/2013 1757   GLUCOSE 119 (H) 12/26/2017 0918   GLUCOSE 95 01/14/2013 1757   BUN 10 12/26/2017 0918   BUN 18 09/04/2017 1055   BUN 11 01/14/2013 1757   CREATININE 0.70 12/26/2017 0918   CREATININE 0.76 12/25/2016 1201   CALCIUM 9.3 12/26/2017 0918   CALCIUM 9.2 01/14/2013 1757   GFRNONAA 100 09/04/2017 1055   GFRNONAA 89 12/25/2016 1201   GFRAA 115 09/04/2017 1055   GFRAA >89 12/25/2016 1201   Lab Results  Component Value Date   HGBA1C 5.9 12/26/2017   HGBA1C 5.9 (H) 09/04/2017   HGBA1C 5.9 (H) 05/05/2017   HGBA1C 6.0 (H) 01/21/2017   HGBA1C 6.0 05/27/2016   Lab Results  Component Value Date   INSULIN  33.6 (H) 09/04/2017   INSULIN 26.0 (H) 05/05/2017   INSULIN 42.1 (H) 01/21/2017   CBC    Component Value Date/Time   WBC 5.7 10/21/2017 1430   RBC 4.27 10/21/2017 1430   HGB 14.3 10/21/2017 1430   HGB 14.5 09/04/2017 1055   HCT 41.8 10/21/2017 1430   HCT 42.8 09/04/2017 1055   PLT 191.0 10/21/2017 1430   PLT 214 06/20/2015 1141   MCV 97.9 10/21/2017  1430   MCV 98 (H) 09/04/2017 1055   MCV 96 01/15/2013 0522   MCH 33.0 09/04/2017 1055   MCH 32.6 08/27/2016 0904   MCHC 34.2 10/21/2017 1430   RDW 13.7 10/21/2017 1430   RDW 13.8 09/04/2017 1055   RDW 14.3 01/15/2013 0522   LYMPHSABS 2.5 10/21/2017 1430   LYMPHSABS 2.3 09/04/2017 1055   LYMPHSABS 3.6 01/15/2013 0522   MONOABS 0.6 10/21/2017 1430   MONOABS 0.8 01/15/2013 0522   EOSABS 0.2 10/21/2017 1430   EOSABS 0.2 09/04/2017 1055   EOSABS 0.3 01/15/2013 0522   BASOSABS 0.0 10/21/2017 1430   BASOSABS 0.0 09/04/2017 1055   BASOSABS 0.0 01/15/2013 0522   Iron/TIBC/Ferritin/ %Sat No results found for: IRON, TIBC, FERRITIN, IRONPCTSAT Lipid Panel     Component Value Date/Time   CHOL 153 12/26/2017 0918   CHOL 155 09/04/2017 1055   CHOL 190 01/15/2013 0522   TRIG 204.0 (H) 12/26/2017 0918   TRIG 307 (H) 01/15/2013 0522   HDL 57.70 12/26/2017 0918   HDL 57 09/04/2017 1055   HDL 44 01/15/2013 0522   CHOLHDL 3 12/26/2017 0918   VLDL 40.8 (H) 12/26/2017 0918   VLDL 61 (H) 01/15/2013 0522   LDLCALC 62 09/04/2017 1055   LDLCALC 85 01/15/2013 0522   LDLDIRECT 72.0 12/26/2017 0918   Hepatic Function Panel     Component Value Date/Time   PROT 6.8 12/26/2017 0918   PROT 7.3 09/04/2017 1055   PROT 7.7 01/14/2013 1757   ALBUMIN 4.1 12/26/2017 0918   ALBUMIN 4.7 09/04/2017 1055   ALBUMIN 3.8 01/14/2013 1757   AST 16 12/26/2017 0918   AST 25 01/14/2013 1757   ALT 18 12/26/2017 0918   ALT 39 01/14/2013 1757   ALKPHOS 73 12/26/2017 0918   ALKPHOS 100 01/14/2013 1757   BILITOT 0.4 12/26/2017 0918   BILITOT 0.3 09/04/2017  1055   BILITOT 0.3 01/14/2013 1757      Component Value Date/Time   TSH 3.850 01/21/2017 1128   TSH 2.09 12/25/2016 1201   TSH 3.163 03/01/2014 1201  Results for SADE, HOLLON (MRN [id number]) as of 04/28/2018 07:24  Ref. Range 02/05/2018 11:41  Vitamin D, 25-Hydroxy Latest Ref Range: 30.0 - 100.0 ng/mL 33.0    ASSESSMENT AND PLAN: Vitamin D deficiency  Prediabetes - Plan: metFORMIN (GLUCOPHAGE) 500 MG tablet  At risk for diabetes mellitus  Class 3 severe obesity with serious comorbidity and body mass index (BMI) of 40.0 to 44.9 in adult, unspecified obesity type ([place])  PLAN:  Vitamin D Deficiency Libra was informed that low vitamin D levels contributes to fatigue and are associated with obesity, breast, and colon cancer. Nailyn agrees to continue taking prescription Vit D @50 ,000 IU every week and will follow up for routine testing of vitamin D, at least 2-3 times per year. She was informed of the risk of over-replacement of vitamin D and agrees to not increase her dose unless she discusses this with [place] first. Nessa agrees to follow up with our clinic in 3 weeks.  Pre-Diabetes Afrika will continue to work on weight loss, exercise, and decreasing simple carbohydrates in her diet to help decrease the risk of diabetes. We dicussed metformin including benefits and risks. She was informed that eating too many simple carbohydrates or too many calories at one sitting increases the likelihood of GI side effects. Laurian agrees to continue taking metformin 500 mg BID #60 and we will refill for 1 month. Atlas agrees to follow up with our clinic  in 3 weeks as directed to monitor her progress.  Diabetes risk counselling Elyza was given extended (15 minutes) diabetes prevention counseling today. She is 57 y.o. female and has risk factors for diabetes including obesity and pre-diabetes. We discussed intensive lifestyle modifications today with an emphasis on weight loss as well as increasing exercise  and decreasing simple carbohydrates in her diet.  Obesity Brita is currently in the action stage of change. As such, her goal is to continue with weight loss efforts She has agreed to change to follow the Pescatarian eating plan + 200 calories Irving has been instructed to work up to a goal of 150 minutes of combined cardio and strengthening exercise per week for weight loss and overall health benefits. We discussed the following Behavioral Modification Strategies today: increasing lean protein intake, decreasing simple carbohydrates  and work on meal planning and easy cooking plans   Lori has agreed to follow up with our clinic in 3 weeks. She was informed of the importance of frequent follow up visits to maximize her success with intensive lifestyle modifications for her multiple health conditions.   OBESITY BEHAVIORAL INTERVENTION VISIT  Today's visit was # 24   Starting weight: 246 lbs Starting date: 01/21/17 Today's weight : 234 lbs  Today's date: 04/23/2018 Total lbs lost to date: 12    ASK: We discussed the diagnosis of obesity with Windell Moment today and Jacqulynn agreed to give [place] permission to discuss obesity behavioral modification therapy today.  ASSESS: Jhoanna has the diagnosis of obesity and her BMI today is 41.46 Tamari is in the action stage of change   ADVISE: Ayva was educated on the multiple health risks of obesity as well as the benefit of weight loss to improve her health. She was advised of the need for long term treatment and the importance of lifestyle modifications.  AGREE: Multiple dietary modification options and treatment options were discussed and  Breahna agreed to the above obesity treatment plan.  Wilhemena Durie, am acting as transcriptionist for Abby Potash, PA-C I, Abby Potash, PA-C have reviewed above note and agree with its content

## 2018-04-29 ENCOUNTER — Ambulatory Visit: Payer: BLUE CROSS/BLUE SHIELD | Admitting: Family Medicine

## 2018-04-29 ENCOUNTER — Encounter: Payer: Self-pay | Admitting: Family Medicine

## 2018-04-29 VITALS — BP 118/76 | HR 79 | Temp 98.6°F | Ht 63.0 in | Wt 239.0 lb

## 2018-04-29 DIAGNOSIS — J22 Unspecified acute lower respiratory infection: Secondary | ICD-10-CM

## 2018-04-29 DIAGNOSIS — J040 Acute laryngitis: Secondary | ICD-10-CM

## 2018-04-29 MED ORDER — AZITHROMYCIN 250 MG PO TABS
ORAL_TABLET | ORAL | 0 refills | Status: DC
Start: 2018-04-29 — End: 2018-06-03

## 2018-04-29 MED ORDER — GUAIFENESIN-CODEINE 100-10 MG/5ML PO SYRP: 5.0000 mL | ORAL_SOLUTION | Freq: Two times a day (BID) | ORAL | 0 refills | Status: DC | PRN

## 2018-04-29 NOTE — Progress Notes (Signed)
BP 118/76 (BP Location: Right Arm, Patient Position: Sitting, Cuff Size: Large)   Pulse 79   Temp 98.6 F (37 C) (Oral)   Ht 5\' 3"  (1.6 m)   Wt 239 lb (108.4 kg)   SpO2 97%   BMI 42.34 kg/m    CC: cough Subjective:    Patient ID: Stacey Nichols, female    DOB: [birth year], 57 y.o.   MRN: [id number]  HPI: SEHAJ Nichols is a 57 y.o. female presenting on 04/29/2018 for Cough (C/o cough that started about 2 wks ago, worse at night. Also has chest congestion and hoarseness. )   2 wk h/o URI symptoms with chest congestion, night time cough, overall felt well during the day. Initial dyspnea and wheezing. 5d h/o hoarseness (worsening), breathing better but persistent chest congestion. Headache and ear pain and sore throat.   No fevers/chills,  Nephews sick at home as well.  No h/o asthma.   Taking dayquil, nyquil, tylenol, mucinex without improvement.   Relevant past medical, surgical, family and social history reviewed and updated as indicated. Interim medical history since our last visit reviewed. Allergies and medications reviewed and updated. Outpatient Medications Prior to Visit  Medication Sig Dispense Refill  . acetaminophen (TYLENOL) 500 MG tablet Take 500 mg by mouth every 6 (six) hours as needed.    Marland Kitchen aspirin 81 MG tablet Take 81 mg by mouth daily.    . Calcium Citrate (CALCITRATE PO) Take 1 tablet by mouth daily.    . metFORMIN (GLUCOPHAGE) 500 MG tablet Take 1 tablet (500 mg total) by mouth 2 (two) times daily with a meal. 60 tablet 0  . Multiple Vitamins-Minerals (MULTI COMPLETE PO) Take 1 tablet by mouth daily.    . naproxen (NAPROSYN) 500 MG tablet Take 500 mg by mouth daily at 12 noon.    . rosuvastatin (CRESTOR) 10 MG tablet Take 1 tablet (10 mg total) by mouth at bedtime. 90 tablet 3  . vitamin B-12 (CYANOCOBALAMIN) 250 MCG tablet Take 250 mcg by mouth daily.    . vitamin B-12 (CYANOCOBALAMIN) 1000 MCG tablet Take 1,000 mcg by mouth daily.    . Vitamin D,  Ergocalciferol, (DRISDOL) 50000 units CAPS capsule Take 1 capsule (50,000 Units total) by mouth every 7 (seven) days. 4 capsule 0   No facility-administered medications prior to visit.      Per HPI unless specifically indicated in ROS section below Review of Systems     Objective:    BP 118/76 (BP Location: Right Arm, Patient Position: Sitting, Cuff Size: Large)   Pulse 79   Temp 98.6 F (37 C) (Oral)   Ht 5\' 3"  (1.6 m)   Wt 239 lb (108.4 kg)   SpO2 97%   BMI 42.34 kg/m   Wt Readings from Last 3 Encounters:  04/29/18 239 lb (108.4 kg)  04/23/18 234 lb (106.1 kg)  04/06/18 234 lb (106.1 kg)    Physical Exam  Constitutional: She appears well-developed and well-nourished. No distress.  HENT:  Head: Normocephalic and atraumatic.  Right Ear: Hearing, tympanic membrane, external ear and ear canal normal.  Left Ear: Hearing, tympanic membrane, external ear and ear canal normal.  Nose: Mucosal edema present. No rhinorrhea. Right sinus exhibits no maxillary sinus tenderness and no frontal sinus tenderness. Left sinus exhibits no maxillary sinus tenderness and no frontal sinus tenderness.  Mouth/Throat: Uvula is midline and mucous membranes are normal. Posterior oropharyngeal erythema (mild) present. No oropharyngeal exudate, posterior oropharyngeal edema or tonsillar abscesses.  Hoarse voice  Eyes: Pupils are equal, round, and reactive to light. Conjunctivae and EOM are normal. No scleral icterus.  Neck: Normal range of motion. Neck supple.  Cardiovascular: Normal rate, regular rhythm, normal heart sounds and intact distal pulses.  No murmur heard. Pulmonary/Chest: Effort normal and breath sounds normal. No respiratory distress. She has no wheezes. She has no rales.  Lungs clear  Lymphadenopathy:    She has no cervical adenopathy.  Skin: Skin is warm and dry. No rash noted.  Nursing note and vitals reviewed.  Results for orders placed or performed in visit on 03/17/18  HM  MAMMOGRAPHY  Result Value Ref Range   HM Mammogram 0-4 Bi-Rad 0-4 Bi-Rad, Self Reported Normal      Assessment & Plan:   Problem List Items Addressed This Visit    Acute respiratory infection - Primary    Given duration, will treat with azithromycin course cheratussin for night time cough. Further supportive care reviewed.  Update if not improving with treatment.       Relevant Medications   azithromycin (ZITHROMAX) 250 MG tablet    Other Visit Diagnoses    Laryngitis           Meds ordered this encounter  Medications  . azithromycin (ZITHROMAX) 250 MG tablet    Sig: Take two tablets on day one followed by one tablet on days 2-5    Dispense:  6 each    Refill:  0  . guaiFENesin-codeine (CHERATUSSIN AC) 100-10 MG/5ML syrup    Sig: Take 5 mLs by mouth 2 (two) times daily as needed for cough (sedation precautions).    Dispense:  120 mL    Refill:  0   No orders of the defined types were placed in this encounter.   Follow up plan: Return if symptoms worsen or fail to improve.  Ria Bush, MD

## 2018-04-29 NOTE — Assessment & Plan Note (Signed)
Given duration, will treat with azithromycin course cheratussin for night time cough. Further supportive care reviewed.  Update if not improving with treatment.

## 2018-04-29 NOTE — Patient Instructions (Addendum)
Creo que tiene infeccion respiratoria con laringitis.  Use antibiotico azithromycina  Puede usar jarabe de codeina para la tos por la noche.  Dejenos saber si fiebre >101, o empeora la tos.   Laringitis (Laryngitis) La laringitis es la inflamacin de las cuerdas vocales. Esto provoca ronquera, tos, prdida de la voz, dolor de garganta o sequedad en la garganta. Las cuerdas vocales son dos pares de msculos que se encuentran en la garganta. Al hablar, estas cuerdas se juntan y vibran. Estas vibraciones salen por la boca como sonido. Cuando las cuerdas vocales se inflaman, la voz suena diferente. La laringitis puede ser temporal (aguda) o de larga duracin (crnica). [place] [employer] de los casos de laringitis aguda mejoran con [place]. La laringitis crnica es la laringitis que dura ms de tres semanas. CAUSAS Las causas de la laringitis aguda pueden ser las siguientes:  Infecciones virales.  Hablar, gritar o cantar mucho. Esto se denomina tambin esfuerzo vocal.  Infecciones bacterianas. Las causas de la laringitis crnica pueden ser las siguientes:  Esfuerzo vocal.  Lesin en las cuerdas vocales.  Reflujo cido (enfermedad por reflujo gastroesofgico o ERGE).  Alergias.  Infecciones de los senos paranasales.  Fumar.  El consumo excesivo de alcohol.  Respirar sustancias qumicas o polvo.  Tumores en las cuerdas vocales. [place] los factores de riesgo de la laringitis se incluyen los siguientes:  Fumar.  El consumo excesivo de alcohol.  Tener alergias. SIGNOS Y SNTOMAS Los sntomas de la laringitis pueden incluir los siguientes:  Voz baja y ronca.  Prdida de la voz.  Tos seca  Dolor de Investment banker, operational.  Nariz tapada. DIAGNSTICO La laringitis puede diagnosticarse mediante:  Examen fsico.  Cultivo de secreciones de la garganta.  Anlisis de [place].  Laringoscopia. Este procedimiento le permite al mdico observar las cuerdas vocales con un espejo  o un tubo de observacin. TRATAMIENTO El tratamiento de la laringitis depende de su causa. Por lo general, incluye descansar la voz y usar medicamentos para Management consultant. Sin embargo, si la laringitis se debe a una infeccin bacteriana, es posible que deba tomar antibiticos. Si la laringitis se debe a un tumor, es posible que deba someterse a un procedimiento para extirparlo. INSTRUCCIONES PARA EL CUIDADO EN EL HOGAR  Beba suficiente lquido para mantener la orina clara o de color amarillo plido.  Inhale aire hmedo. Use un humidificador si vive en un clima seco.  Tome los medicamentos solamente como se lo haya indicado el mdico.  Si le recetaron antibiticos, asegrese de terminarlos, incluso si comienza a sentirse mejor.  Nofume cigarrillos ni cigarrillos electrnicos. Si necesita ayuda para dejar de fumar, consulte al mdico.  Hable lo menos posible. Evite tambin susurrar ya que puede provocar esfuerzo vocal.  Alla German en vez de hablar. Hgalo hasta que su voz vuelva a la normalidad. SOLICITE ATENCIN MDICA SI:  Jaclynn Guarneri.  Aumenta el dolor.  Tiene dificultad para tragar. SOLICITE ATENCIN MDICA DE INMEDIATO SI:  Tose y escupe sangre.  Tiene dificultad para respirar. Esta informacin no tiene Marine scientist el consejo del mdico. Asegrese de hacerle al mdico cualquier pregunta que tenga. Document Released: 04/10/2005 Document Revised: 07/22/2014 Document Reviewed: 12/14/2013 Elsevier Interactive Patient Education  [employer].

## 2018-05-06 ENCOUNTER — Ambulatory Visit (INDEPENDENT_AMBULATORY_CARE_PROVIDER_SITE_OTHER): Payer: BLUE CROSS/BLUE SHIELD | Admitting: Physician Assistant

## 2018-05-06 ENCOUNTER — Encounter (INDEPENDENT_AMBULATORY_CARE_PROVIDER_SITE_OTHER): Payer: Self-pay | Admitting: Physician Assistant

## 2018-05-06 VITALS — BP 116/79 | HR 70 | Temp 98.1°F | Ht 63.0 in | Wt 238.0 lb

## 2018-05-06 DIAGNOSIS — Z9189 Other specified personal risk factors, not elsewhere classified: Secondary | ICD-10-CM

## 2018-05-06 DIAGNOSIS — R7303 Prediabetes: Secondary | ICD-10-CM | POA: Diagnosis not present

## 2018-05-06 DIAGNOSIS — Z6841 Body Mass Index (BMI) 40.0 and over, adult: Secondary | ICD-10-CM

## 2018-05-06 DIAGNOSIS — E559 Vitamin D deficiency, unspecified: Secondary | ICD-10-CM

## 2018-05-06 MED ORDER — METFORMIN HCL 500 MG PO TABS: 500.0000 mg | ORAL_TABLET | Freq: Two times a day (BID) | ORAL | 0 refills | Status: DC

## 2018-05-09 NOTE — Progress Notes (Signed)
Office: [phone]  /  Fax: [phone]   HPI:   Chief Complaint: OBESITY Stacey Nichols is here to discuss her progress with her obesity treatment plan. She is on the Pescatarian eating plan + 200 calories and is following her eating plan approximately 70 % of the time. She states she is exercising 0 minutes 0 times per week. Stacey Nichols struggled to follow the plan due to late night cravings. She is taking both of her metformin doses in the morning instead of one in the morning and one at dinner. She is ready to get back on track.  Her weight is 238 lb (108 kg) today and has gained 4 pounds since her last visit. She has lost 8 lbs since starting treatment with [place].  Pre-Diabetes Stacey Nichols has a diagnosis of pre-diabetes based on her elevated Hgb A1c and was informed this puts her at greater risk of developing diabetes. She denies polyphagia or hypoglycemia. She is taking both metformin doses in the morning. She continues to work on diet and exercise to decrease risk of diabetes. She denies nausea, vomiting, or diarrhea.  At risk for diabetes Stacey Nichols is at higher than average risk for developing diabetes due to her obesity and pre-diabetes. She currently denies polyuria or polydipsia.  Vitamin D Deficiency Stacey Nichols has a diagnosis of vitamin D deficiency. She is currently taking prescription Vit D and denies nausea, vomiting or muscle weakness.  ALLERGIES: Allergies  Allergen Reactions  . Latex Other (See Comments) and Itching  . Sulfa Antibiotics Rash    [Rash] rash - paper tape OK  . Tape Rash    [Rash] rash - paper tape OK    MEDICATIONS: Current Outpatient Medications on File Prior to Visit  Medication Sig Dispense Refill  . acetaminophen (TYLENOL) 500 MG tablet Take 500 mg by mouth every 6 (six) hours as needed.    Marland Kitchen aspirin 81 MG tablet Take 81 mg by mouth daily.    Marland Kitchen azithromycin (ZITHROMAX) 250 MG tablet Take two tablets on day one followed by one tablet on days 2-5 6 each 0  . Calcium  Citrate (CALCITRATE PO) Take 1 tablet by mouth daily.    Marland Kitchen guaiFENesin-codeine (CHERATUSSIN AC) 100-10 MG/5ML syrup Take 5 mLs by mouth 2 (two) times daily as needed for cough (sedation precautions). 120 mL 0  . Multiple Vitamins-Minerals (MULTI COMPLETE PO) Take 1 tablet by mouth daily.    . naproxen (NAPROSYN) 500 MG tablet Take 500 mg by mouth daily at 12 noon.    . rosuvastatin (CRESTOR) 10 MG tablet Take 1 tablet (10 mg total) by mouth at bedtime. 90 tablet 3  . vitamin B-12 (CYANOCOBALAMIN) 250 MCG tablet Take 250 mcg by mouth daily.     No current facility-administered medications on file prior to visit.     PAST MEDICAL HISTORY: Past Medical History:  Diagnosis Date  . 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 Anemia    hx of  . Anxiety   . Arthritis    severe  . Constipation   . Depression   . GERD (gastroesophageal reflux disease)   . H/O Bell's palsy   . H/O hiatal hernia   . Headache    occasional  . High triglycerides   . History of cancer chemotherapy   . Leukemia ([place])   . Osteopenia 06/08/2017   DEXA 2017 - T -1.8 spine  . Pneumonia    hx of   . Rheumatoid arthritis ([place])   .  Sleep apnea   . Stroke ([place])    "mini stroke-caused bells palsy for 1 month"    PAST SURGICAL HISTORY: Past Surgical History:  Procedure Laterality Date  . ABDOMINAL HYSTERECTOMY  2001   heavy bleeding, precancerous cells, 1 ovary remains  . LAPAROSCOPIC GASTRIC BAND REMOVAL WITH LAPAROSCOPIC GASTRIC SLEEVE RESECTION  04/26/2014  . LAPAROSCOPIC GASTRIC BANDING  2000   performed in [place]  . TUBAL LIGATION  1996  . UPPER GI ENDOSCOPY N/A 04/26/2014   Procedure: UPPER GI ENDOSCOPY;  Surgeon: Pedro Earls, MD;  Location: [facility];  Service: General;  Laterality: N/A;    SOCIAL HISTORY: Social History   Tobacco Use  . Smoking status: Former Smoker    Years: 30.00    Types: Cigarettes    Last attempt to quit: 03/30/2018    Years since quitting:  0.1  . Smokeless tobacco: Never Used  Substance Use Topics  . Alcohol use: Yes    Comment: occasional  . Drug use: No    FAMILY HISTORY: Family History  Problem Relation Age of Onset  . Cancer Mother        cervix  . Hyperlipidemia Father   . Obesity Father   . CAD Father        MI and arrhythmia  . Cancer Sister        metastatic, ?pancrease primary  . Diabetes Maternal Grandfather     ROS: Review of Systems  Constitutional: Negative for weight loss.  Gastrointestinal: Negative for diarrhea, nausea and vomiting.  Genitourinary: Negative for frequency.  Musculoskeletal:       Negative muscle weakness  Endo/Heme/Allergies: Negative for polydipsia.       Negative polyphagia Negative hypoglycemia    PHYSICAL EXAM: Blood pressure 116/79, pulse 70, temperature 98.1 F (36.7 C), temperature source Oral, height 5\' 3"  (1.6 m), weight 238 lb (108 kg), SpO2 96 %. Body mass index is 42.16 kg/m. Physical Exam  Constitutional: She is oriented to person, place, and time. She appears well-developed and well-nourished.  Cardiovascular: Normal rate.  Pulmonary/Chest: Effort normal.  Musculoskeletal: Normal range of motion.  Neurological: She is oriented to person, place, and time.  Skin: Skin is warm and dry.  Psychiatric: She has a normal mood and affect. Her behavior is normal.  Vitals reviewed.   RECENT LABS AND TESTS: BMET    Component Value Date/Time   NA 142 12/26/2017 0918   NA 144 09/04/2017 1055   NA 142 01/14/2013 1757   K 4.0 12/26/2017 0918   K 4.6 01/14/2013 1757   CL 106 12/26/2017 0918   CL 107 01/14/2013 1757   CO2 28 12/26/2017 0918   CO2 29 01/14/2013 1757   GLUCOSE 119 (H) 12/26/2017 0918   GLUCOSE 95 01/14/2013 1757   BUN 10 12/26/2017 0918   BUN 18 09/04/2017 1055   BUN 11 01/14/2013 1757   CREATININE 0.70 12/26/2017 0918   CREATININE 0.76 12/25/2016 1201   CALCIUM 9.3 12/26/2017 0918   CALCIUM 9.2 01/14/2013 1757   GFRNONAA 100 09/04/2017  1055   GFRNONAA 89 12/25/2016 1201   GFRAA 115 09/04/2017 1055   GFRAA >89 12/25/2016 1201   Lab Results  Component Value Date   HGBA1C 5.9 12/26/2017   HGBA1C 5.9 (H) 09/04/2017   HGBA1C 5.9 (H) 05/05/2017   HGBA1C 6.0 (H) 01/21/2017   HGBA1C 6.0 05/27/2016   Lab Results  Component Value Date   INSULIN 33.6 (H) 09/04/2017   INSULIN 26.0 (H) 05/05/2017  INSULIN 42.1 (H) 01/21/2017   CBC    Component Value Date/Time   WBC 5.7 10/21/2017 1430   RBC 4.27 10/21/2017 1430   HGB 14.3 10/21/2017 1430   HGB 14.5 09/04/2017 1055   HCT 41.8 10/21/2017 1430   HCT 42.8 09/04/2017 1055   PLT 191.0 10/21/2017 1430   PLT 214 06/20/2015 1141   MCV 97.9 10/21/2017 1430   MCV 98 (H) 09/04/2017 1055   MCV 96 01/15/2013 0522   MCH 33.0 09/04/2017 1055   MCH 32.6 08/27/2016 0904   MCHC 34.2 10/21/2017 1430   RDW 13.7 10/21/2017 1430   RDW 13.8 09/04/2017 1055   RDW 14.3 01/15/2013 0522   LYMPHSABS 2.5 10/21/2017 1430   LYMPHSABS 2.3 09/04/2017 1055   LYMPHSABS 3.6 01/15/2013 0522   MONOABS 0.6 10/21/2017 1430   MONOABS 0.8 01/15/2013 0522   EOSABS 0.2 10/21/2017 1430   EOSABS 0.2 09/04/2017 1055   EOSABS 0.3 01/15/2013 0522   BASOSABS 0.0 10/21/2017 1430   BASOSABS 0.0 09/04/2017 1055   BASOSABS 0.0 01/15/2013 0522   Iron/TIBC/Ferritin/ %Sat No results found for: IRON, TIBC, FERRITIN, IRONPCTSAT Lipid Panel     Component Value Date/Time   CHOL 153 12/26/2017 0918   CHOL 155 09/04/2017 1055   CHOL 190 01/15/2013 0522   TRIG 204.0 (H) 12/26/2017 0918   TRIG 307 (H) 01/15/2013 0522   HDL 57.70 12/26/2017 0918   HDL 57 09/04/2017 1055   HDL 44 01/15/2013 0522   CHOLHDL 3 12/26/2017 0918   VLDL 40.8 (H) 12/26/2017 0918   VLDL 61 (H) 01/15/2013 0522   LDLCALC 62 09/04/2017 1055   LDLCALC 85 01/15/2013 0522   LDLDIRECT 72.0 12/26/2017 0918   Hepatic Function Panel     Component Value Date/Time   PROT 6.8 12/26/2017 0918   PROT 7.3 09/04/2017 1055   PROT 7.7 01/14/2013  1757   ALBUMIN 4.1 12/26/2017 0918   ALBUMIN 4.7 09/04/2017 1055   ALBUMIN 3.8 01/14/2013 1757   AST 16 12/26/2017 0918   AST 25 01/14/2013 1757   ALT 18 12/26/2017 0918   ALT 39 01/14/2013 1757   ALKPHOS 73 12/26/2017 0918   ALKPHOS 100 01/14/2013 1757   BILITOT 0.4 12/26/2017 0918   BILITOT 0.3 09/04/2017 1055   BILITOT 0.3 01/14/2013 1757      Component Value Date/Time   TSH 3.850 01/21/2017 1128   TSH 2.09 12/25/2016 1201   TSH 3.163 03/01/2014 1201  Results for DAVELYN, GWINN (MRN [id number]) as of 05/09/2018 17:40  Ref. Range 02/05/2018 11:41  Vitamin D, 25-Hydroxy Latest Ref Range: 30.0 - 100.0 ng/mL 33.0    ASSESSMENT AND PLAN: Prediabetes - Plan: metFORMIN (GLUCOPHAGE) 500 MG tablet  Vitamin D deficiency  At risk for diabetes mellitus  Class 3 severe obesity with serious comorbidity and body mass index (BMI) of 40.0 to 44.9 in adult, unspecified obesity type [facility])  PLAN:  Pre-Diabetes Stacey Nichols will continue to work on weight loss, exercise, and decreasing simple carbohydrates in her diet to help decrease the risk of diabetes. We dicussed metformin including benefits and risks. She was informed that eating too many simple carbohydrates or too many calories at one sitting increases the likelihood of GI side effects. Stacey Nichols agrees to continue taking metformin 500 mg BID #60 and we will refill for 1 month, she is to take 1 tablet PO q AM and 1 tablet PO q PM. Stacey Nichols agrees to follow up with our clinic in 4 weeks as directed to monitor  her progress.  Diabetes risk counselling Stacey Nichols was given extended (15 minutes) diabetes prevention counseling today. She is 57 y.o. female and has risk factors for diabetes including obesity and pre-diabetes. We discussed intensive lifestyle modifications today with an emphasis on weight loss as well as increasing exercise and decreasing simple carbohydrates in her diet.  Vitamin D Deficiency Stacey Nichols was informed that low vitamin D levels  contributes to fatigue and are associated with obesity, breast, and colon cancer. Stacey Nichols agrees to continue taking prescription Vit D @50 ,000 IU every week and will follow up for routine testing of vitamin D, at least 2-3 times per year. She was informed of the risk of over-replacement of vitamin D and agrees to not increase her dose unless she discusses this with [place] first. Stacey Nichols agrees to follow up with our clinic in 4 weeks.  Obesity Stacey Nichols is currently in the action stage of change. As such, her goal is to continue with weight loss efforts She has agreed to follow the Category 2 plan + 200 calories Stacey Nichols has been instructed to work up to a goal of 150 minutes of combined cardio and strengthening exercise per week for weight loss and overall health benefits. We discussed the following Behavioral Modification Strategies today: work on meal planning and easy cooking plans and ways to avoid night time snacking   Stacey Nichols has agreed to follow up with our clinic in 4 weeks. She was informed of the importance of frequent follow up visits to maximize her success with intensive lifestyle modifications for her multiple health conditions.   OBESITY BEHAVIORAL INTERVENTION VISIT  Today's visit was # 25   Starting weight: 246 lbs Starting date: 01/21/17 Today's weight : 238 lbs Today's date: 05/06/2018 Total lbs lost to date: 8    ASK: We discussed the diagnosis of obesity with Stacey Nichols today and Stacey Nichols agreed to give [place] permission to discuss obesity behavioral modification therapy today.  ASSESS: Stacey Nichols has the diagnosis of obesity and her BMI today is 42.17 Stacey Nichols is in the action stage of change   ADVISE: Stacey Nichols was educated on the multiple health risks of obesity as well as the benefit of weight loss to improve her health. She was advised of the need for long term treatment and the importance of lifestyle modifications.  AGREE: Multiple dietary modification options and treatment options  were discussed and  Stacey Nichols agreed to the above obesity treatment plan.  Stacey Nichols, am acting as transcriptionist for Abby Potash, PA-C I, Abby Potash, PA-C have reviewed above note and agree with its content

## 2018-06-03 ENCOUNTER — Ambulatory Visit (INDEPENDENT_AMBULATORY_CARE_PROVIDER_SITE_OTHER): Payer: BLUE CROSS/BLUE SHIELD | Admitting: Physician Assistant

## 2018-06-03 VITALS — BP 115/81 | HR 80 | Temp 97.8°F | Ht 63.0 in | Wt 236.0 lb

## 2018-06-03 DIAGNOSIS — E7849 Other hyperlipidemia: Secondary | ICD-10-CM

## 2018-06-03 DIAGNOSIS — R7303 Prediabetes: Secondary | ICD-10-CM | POA: Diagnosis not present

## 2018-06-03 DIAGNOSIS — Z9189 Other specified personal risk factors, not elsewhere classified: Secondary | ICD-10-CM | POA: Diagnosis not present

## 2018-06-03 DIAGNOSIS — E66813 Obesity, class 3: Secondary | ICD-10-CM

## 2018-06-03 DIAGNOSIS — Z6841 Body Mass Index (BMI) 40.0 and over, adult: Secondary | ICD-10-CM

## 2018-06-03 MED ORDER — METFORMIN HCL 500 MG PO TABS: 500.0000 mg | ORAL_TABLET | Freq: Two times a day (BID) | ORAL | 1 refills | Status: DC

## 2018-06-08 NOTE — Progress Notes (Signed)
Office: [phone]  /  Fax: [phone]   HPI:   Chief Complaint: OBESITY Stacey Nichols is here to discuss her progress with her obesity treatment plan. She is on the Category 2 plan + 200 calories and is following her eating plan approximately 70 % of the time. She states she is walking 20 minutes or bicycling for 10-20 minutes 7 times per week. Stacey Nichols did well with weight loss. She is eating quesadillas with vegetables for dinner and reporting hunger late at night. She is not getting enough protein in at her dinner meal. She is traveling to [place] for 3 weeks and then having a knee replacement surgery.  Her weight is 236 lb (107 kg) today and has had a weight loss of 2 pounds over a period of 4 weeks since her last visit. She has lost 10 lbs since starting treatment with [place].  Stacey Stacey Nichols has a diagnosis of Stacey based on her elevated Hgb A1c and was informed this puts her at greater risk of developing diabetes. She is taking metformin currently and denies nausea, vomiting, or diarrhea. She reports polyphagia at night however and denies hypoglycemia. She continues to work on diet and exercise to decrease risk of diabetes.  Hyperlipidemia Stacey Nichols has hyperlipidemia and has been trying to improve her cholesterol levels with intensive lifestyle modification including a low saturated fat diet, exercise and weight loss. She is on Crestor and denies any chest pain, claudication or myalgias.  At risk for cardiovascular disease Stacey Nichols is at a higher than average risk for cardiovascular disease due to obesity and hyperlipidemia. She currently denies any chest pain.  ALLERGIES: Allergies  Allergen Reactions  . Latex Other (See Comments) and Itching  . Sulfa Antibiotics Rash    [Rash] rash - paper tape OK  . Tape Rash    [Rash] rash - paper tape OK    MEDICATIONS: Current Outpatient Medications on File Prior to Visit  Medication Sig Dispense Refill  . aspirin 81 MG tablet Take 81 mg by  mouth daily.    . Calcium Citrate (CALCITRATE PO) Take 1 tablet by mouth daily.    . Multiple Vitamins-Minerals (MULTI COMPLETE PO) Take 1 tablet by mouth daily.    . naproxen (NAPROSYN) 500 MG tablet Take 500 mg by mouth daily at 12 noon.    . rosuvastatin (CRESTOR) 10 MG tablet Take 1 tablet (10 mg total) by mouth at bedtime. 90 tablet 3  . vitamin B-12 (CYANOCOBALAMIN) 250 MCG tablet Take 250 mcg by mouth daily.    Stacey Nichols Kitchen acetaminophen (TYLENOL) 500 MG tablet Take 500 mg by mouth every 6 (six) hours as needed.    Stacey Nichols Kitchen guaiFENesin-codeine (CHERATUSSIN AC) 100-10 MG/5ML syrup Take 5 mLs by mouth 2 (two) times daily as needed for cough (sedation precautions). (Patient not taking: Reported on 06/03/2018) 120 mL 0   No current facility-administered medications on file prior to visit.     PAST MEDICAL HISTORY: Past Medical History:  Diagnosis Date  . 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 Anemia    hx of  . Anxiety   . Arthritis    severe  . Constipation   . Depression   . GERD (gastroesophageal reflux disease)   . H/O Bell's palsy   . H/O hiatal hernia   . Headache    occasional  . High triglycerides   . History of cancer chemotherapy   . Leukemia ([place])   . Osteopenia 06/08/2017   DEXA  2017 - T -1.8 spine  . Pneumonia    hx of   . Rheumatoid arthritis ([place])   . Sleep apnea   . Stroke ([place])    "mini stroke-caused bells palsy for 1 month"    PAST SURGICAL HISTORY: Past Surgical History:  Procedure Laterality Date  . ABDOMINAL HYSTERECTOMY  2001   heavy bleeding, precancerous cells, 1 ovary remains  . LAPAROSCOPIC GASTRIC BAND REMOVAL WITH LAPAROSCOPIC GASTRIC SLEEVE RESECTION  04/26/2014  . LAPAROSCOPIC GASTRIC BANDING  2000   performed in [place]  . TUBAL LIGATION  1996  . UPPER GI ENDOSCOPY N/A 04/26/2014   Procedure: UPPER GI ENDOSCOPY;  Surgeon: Pedro Earls, MD;  Location: [facility];  Service: General;  Laterality: N/A;    SOCIAL  HISTORY: Social History   Tobacco Use  . Smoking status: Former Smoker    Years: 30.00    Types: Cigarettes    Last attempt to quit: 03/30/2018    Years since quitting: 0.1  . Smokeless tobacco: Never Used  Substance Use Topics  . Alcohol use: Yes    Comment: occasional  . Drug use: No    FAMILY HISTORY: Family History  Problem Relation Age of Onset  . Cancer Mother        cervix  . Hyperlipidemia Father   . Obesity Father   . CAD Father        MI and arrhythmia  . Cancer Sister        metastatic, ?pancrease primary  . Diabetes Maternal Grandfather     ROS: Review of Systems  Constitutional: Positive for weight loss.  Cardiovascular: Negative for chest pain and claudication.  Gastrointestinal: Negative for diarrhea, nausea and vomiting.  Musculoskeletal: Negative for myalgias.  Endo/Heme/Allergies:       Negative hypoglycemia    PHYSICAL EXAM: Blood pressure 115/81, pulse 80, temperature 97.8 F (36.6 C), temperature source Oral, height 5\' 3"  (1.6 m), weight 236 lb (107 kg). Body mass index is 41.81 kg/m. Physical Exam  Constitutional: She is oriented to person, place, and time. She appears well-developed and well-nourished.  Cardiovascular: Normal rate.  Pulmonary/Chest: Effort normal.  Musculoskeletal: Normal range of motion.  Neurological: She is oriented to person, place, and time.  Skin: Skin is warm and dry.  Psychiatric: She has a normal mood and affect. Her behavior is normal.  Vitals reviewed.   RECENT LABS AND TESTS: BMET    Component Value Date/Time   NA 142 12/26/2017 0918   NA 144 09/04/2017 1055   NA 142 01/14/2013 1757   K 4.0 12/26/2017 0918   K 4.6 01/14/2013 1757   CL 106 12/26/2017 0918   CL 107 01/14/2013 1757   CO2 28 12/26/2017 0918   CO2 29 01/14/2013 1757   GLUCOSE 119 (H) 12/26/2017 0918   GLUCOSE 95 01/14/2013 1757   BUN 10 12/26/2017 0918   BUN 18 09/04/2017 1055   BUN 11 01/14/2013 1757   CREATININE 0.70 12/26/2017  0918   CREATININE 0.76 12/25/2016 1201   CALCIUM 9.3 12/26/2017 0918   CALCIUM 9.2 01/14/2013 1757   GFRNONAA 100 09/04/2017 1055   GFRNONAA 89 12/25/2016 1201   GFRAA 115 09/04/2017 1055   GFRAA >89 12/25/2016 1201   Lab Results  Component Value Date   HGBA1C 5.9 12/26/2017   HGBA1C 5.9 (H) 09/04/2017   HGBA1C 5.9 (H) 05/05/2017   HGBA1C 6.0 (H) 01/21/2017   HGBA1C 6.0 05/27/2016   Lab Results  Component Value Date   INSULIN  33.6 (H) 09/04/2017   INSULIN 26.0 (H) 05/05/2017   INSULIN 42.1 (H) 01/21/2017   CBC    Component Value Date/Time   WBC 5.7 10/21/2017 1430   RBC 4.27 10/21/2017 1430   HGB 14.3 10/21/2017 1430   HGB 14.5 09/04/2017 1055   HCT 41.8 10/21/2017 1430   HCT 42.8 09/04/2017 1055   PLT 191.0 10/21/2017 1430   PLT 214 06/20/2015 1141   MCV 97.9 10/21/2017 1430   MCV 98 (H) 09/04/2017 1055   MCV 96 01/15/2013 0522   MCH 33.0 09/04/2017 1055   MCH 32.6 08/27/2016 0904   MCHC 34.2 10/21/2017 1430   RDW 13.7 10/21/2017 1430   RDW 13.8 09/04/2017 1055   RDW 14.3 01/15/2013 0522   LYMPHSABS 2.5 10/21/2017 1430   LYMPHSABS 2.3 09/04/2017 1055   LYMPHSABS 3.6 01/15/2013 0522   MONOABS 0.6 10/21/2017 1430   MONOABS 0.8 01/15/2013 0522   EOSABS 0.2 10/21/2017 1430   EOSABS 0.2 09/04/2017 1055   EOSABS 0.3 01/15/2013 0522   BASOSABS 0.0 10/21/2017 1430   BASOSABS 0.0 09/04/2017 1055   BASOSABS 0.0 01/15/2013 0522   Iron/TIBC/Ferritin/ %Sat No results found for: IRON, TIBC, FERRITIN, IRONPCTSAT Lipid Panel     Component Value Date/Time   CHOL 153 12/26/2017 0918   CHOL 155 09/04/2017 1055   CHOL 190 01/15/2013 0522   TRIG 204.0 (H) 12/26/2017 0918   TRIG 307 (H) 01/15/2013 0522   HDL 57.70 12/26/2017 0918   HDL 57 09/04/2017 1055   HDL 44 01/15/2013 0522   CHOLHDL 3 12/26/2017 0918   VLDL 40.8 (H) 12/26/2017 0918   VLDL 61 (H) 01/15/2013 0522   LDLCALC 62 09/04/2017 1055   LDLCALC 85 01/15/2013 0522   LDLDIRECT 72.0 12/26/2017 0918    Hepatic Function Panel     Component Value Date/Time   PROT 6.8 12/26/2017 0918   PROT 7.3 09/04/2017 1055   PROT 7.7 01/14/2013 1757   ALBUMIN 4.1 12/26/2017 0918   ALBUMIN 4.7 09/04/2017 1055   ALBUMIN 3.8 01/14/2013 1757   AST 16 12/26/2017 0918   AST 25 01/14/2013 1757   ALT 18 12/26/2017 0918   ALT 39 01/14/2013 1757   ALKPHOS 73 12/26/2017 0918   ALKPHOS 100 01/14/2013 1757   BILITOT 0.4 12/26/2017 0918   BILITOT 0.3 09/04/2017 1055   BILITOT 0.3 01/14/2013 1757      Component Value Date/Time   TSH 3.850 01/21/2017 1128   TSH 2.09 12/25/2016 1201   TSH 3.163 03/01/2014 1201    ASSESSMENT AND PLAN: Prediabetes - Plan: metFORMIN (GLUCOPHAGE) 500 MG tablet  Other hyperlipidemia  At risk for heart disease  Class 3 severe obesity with serious comorbidity and body mass index (BMI) of 40.0 to 44.9 in adult, unspecified obesity type Stacey Nichols)  PLAN:  Stacey Nichols will continue to work on weight loss, exercise, and decreasing simple carbohydrates in her diet to help decrease the risk of diabetes. We dicussed metformin including benefits and risks. She was informed that eating too many simple carbohydrates or too many calories at one sitting increases the likelihood of GI side effects. Stacey Nichols agrees to continue taking metformin 500 mg BID #60 and we will refill for 2 months (she is not able to return until January 2020). Stacey Nichols agrees to follow up with our clinic in 8 weeks as directed to monitor her progress.  Hyperlipidemia Stacey Nichols was informed of the American Heart Association Guidelines emphasizing intensive lifestyle modifications as the first line treatment for hyperlipidemia. We  discussed many lifestyle modifications today in depth, and Stacey Nichols will continue to work on decreasing saturated fats such as fatty red meat, butter and many fried foods. Stacey Nichols agrees to continue taking Crestor, and will also increase vegetables and lean protein in her diet and continue to work on  diet, exercise, and weight loss efforts. Stacey Nichols agrees to follow up with our clinic in 8 weeks.  Cardiovascular risk counselling Stacey Nichols was given extended (15 minutes) coronary artery disease prevention counseling today. She is 57 y.o. female and has risk factors for heart disease including obesity and hyperlipidemia. We discussed intensive lifestyle modifications today with an emphasis on specific weight loss instructions and strategies. Pt was also informed of the importance of increasing exercise and decreasing saturated fats to help prevent heart disease.  Obesity Stacey Nichols is currently in the action stage of change. As such, her goal is to continue with weight loss efforts She has agreed to follow the Category 2 plan + 200 calories Stacey Nichols has been instructed to work up to a goal of 150 minutes of combined cardio and strengthening exercise per week for weight loss and overall health benefits. We discussed the following Behavioral Modification Strategies today: work on meal planning and easy cooking plans and travel eating strategies    Stacey Nichols has agreed to follow up with our clinic in 8 weeks. She was informed of the importance of frequent follow up visits to maximize her success with intensive lifestyle modifications for her multiple health conditions.   OBESITY BEHAVIORAL INTERVENTION VISIT  Today's visit was # 26  Starting weight: 246 lbs Starting date: 01/21/17 Today's weight : 236 lbs  Today's date: 06/03/2018 Total lbs lost to date: 10    ASK: We discussed the diagnosis of obesity with Stacey Nichols today and Stacey Nichols agreed to give [place] permission to discuss obesity behavioral modification therapy today.  ASSESS: Stacey Nichols has the diagnosis of obesity and her BMI today is 28.82 Stacey Nichols is in the action stage of change   ADVISE: Stacey Nichols was educated on the multiple health risks of obesity as well as the benefit of weight loss to improve her health. She was advised of the need for long term  treatment and the importance of lifestyle modifications.  AGREE: Multiple dietary modification options and treatment options were discussed and  Stacey Nichols agreed to the above obesity treatment plan.  Wilhemena Durie, am acting as transcriptionist for Abby Potash, PA-C I, Abby Potash, PA-C have reviewed above note and agree with its content

## 2018-06-14 HISTORY — PX: REPLACEMENT TOTAL KNEE: SUR1224

## 2018-06-19 NOTE — Telephone Encounter (Signed)
Erroneous Entry  

## 2018-06-24 ENCOUNTER — Encounter (INDEPENDENT_AMBULATORY_CARE_PROVIDER_SITE_OTHER): Payer: Self-pay

## 2018-06-29 ENCOUNTER — Telehealth: Payer: Self-pay

## 2018-06-29 DIAGNOSIS — Z01818 Encounter for other preprocedural examination: Secondary | ICD-10-CM

## 2018-06-29 NOTE — Telephone Encounter (Signed)
Pt may come in for EKG nurse visit if available. Should also get UA and labwork (ordered).

## 2018-06-29 NOTE — Telephone Encounter (Signed)
Received faxed Medical Clearance form from [place].  Looks like they are requesting labs, UA and EKG.  Placed form in Dr. Synthia Innocent box.

## 2018-06-29 NOTE — Telephone Encounter (Signed)
Notified Dr. Darnell Level the EKG cannot be done in a [facility].  He requests pt be scheduled for OV tomorrow PM.  Spoke with pt relaying Dr. Synthia Innocent message.  Pt is scheduled on 06/30/18 at 2:45 PM.

## 2018-06-30 ENCOUNTER — Encounter: Payer: Self-pay | Admitting: Family Medicine

## 2018-06-30 ENCOUNTER — Ambulatory Visit (INDEPENDENT_AMBULATORY_CARE_PROVIDER_SITE_OTHER): Payer: BLUE CROSS/BLUE SHIELD | Admitting: Family Medicine

## 2018-06-30 VITALS — BP 124/68 | HR 78 | Temp 98.2°F | Ht 63.0 in | Wt 228.0 lb

## 2018-06-30 DIAGNOSIS — M1711 Unilateral primary osteoarthritis, right knee: Secondary | ICD-10-CM | POA: Diagnosis not present

## 2018-06-30 DIAGNOSIS — Z01818 Encounter for other preprocedural examination: Secondary | ICD-10-CM | POA: Diagnosis not present

## 2018-06-30 DIAGNOSIS — J069 Acute upper respiratory infection, unspecified: Secondary | ICD-10-CM

## 2018-06-30 LAB — POCT URINALYSIS DIPSTICK
BILIRUBIN UA: NEGATIVE
Blood, UA: NEGATIVE
GLUCOSE UA: NEGATIVE
Nitrite, UA: NEGATIVE
Protein, UA: NEGATIVE
Spec Grav, UA: >=1.03 — AB (ref 1.010–1.025)
Urobilinogen, UA: 0.2 E.U./dL
pH, UA: 5.5 (ref 5.0–8.0)

## 2018-06-30 NOTE — Assessment & Plan Note (Signed)
Towards end of course. Anticipate full recovery. No need for abx at this time.

## 2018-06-30 NOTE — Progress Notes (Signed)
BP 124/68 (BP Location: Left Arm, Patient Position: Sitting, Cuff Size: Large)   Pulse 78   Temp 98.2 F (36.8 C) (Oral)   Ht 5\' 3"  (1.6 m)   Wt 228 lb (103.4 kg)   SpO2 96%   BMI 40.39 kg/m    CC: preop eval Subjective:    Patient ID: Stacey Nichols, female    DOB: [birth year], 57 y.o.   MRN: [id number]  HPI: Stacey Nichols is a 57 y.o. female presenting on 06/30/2018 for Pre-op Exam ([facility] scheduled for 07/02/18. )   Here for evaluation prior to upcoming R knee replacement scheduled this week.  Had preop eval 10/2017 for L knee replacement, tolerated surgery well. Ortho office required labs and EKG within 30 days of surgery so pt presents today for this.   1.5 wk h/o URI sxs, slowly improving each day. Rhinorrhea and nasal congestion. No cough, dyspnea, fever, ST. Sinus congestion has improved.  Treating with OTC remedies including ginger/honey cough drops.   Recent trip to [place].   Denies recent chest pain, tightness, dypsnea. Denies urinary symptoms.   She has tolerated GETA well in the past, s/p hysterectomy 2001 and gastric sleeve 04/2014. She has had some trouble awakening after anesthesia. No post-op nausea/vomiting.   Last cardiac eval 07/2015 with coronary calcium score of 0.  She is currently off all medications other than tylenol in preparation for upcoming surgery. On crestor and metformin through bariatric clinic. Does not have h/o diabetes.  Relevant past medical, surgical, family and social history reviewed and updated as indicated. Interim medical history since our last visit reviewed. Allergies and medications reviewed and updated. Outpatient Medications Prior to Visit  Medication Sig Dispense Refill  . acetaminophen (TYLENOL) 500 MG tablet Take 500 mg by mouth every 6 (six) hours as needed.    Marland Kitchen aspirin 81 MG tablet Take 81 mg by mouth daily.    . Calcium Citrate (CALCITRATE PO) Take 1 tablet by mouth daily.    Marland Kitchen guaiFENesin-codeine (CHERATUSSIN AC)  100-10 MG/5ML syrup Take 5 mLs by mouth 2 (two) times daily as needed for cough (sedation precautions). (Patient not taking: Reported on 06/03/2018) 120 mL 0  . metFORMIN (GLUCOPHAGE) 500 MG tablet Take 1 tablet (500 mg total) by mouth 2 (two) times daily with a meal. (Patient not taking: Reported on 06/30/2018) 60 tablet 1  . Multiple Vitamins-Minerals (MULTI COMPLETE PO) Take 1 tablet by mouth daily.    . naproxen (NAPROSYN) 500 MG tablet Take 500 mg by mouth daily at 12 noon.    . rosuvastatin (CRESTOR) 10 MG tablet Take 1 tablet (10 mg total) by mouth at bedtime. (Patient not taking: Reported on 06/30/2018) 90 tablet 3  . vitamin B-12 (CYANOCOBALAMIN) 250 MCG tablet Take 250 mcg by mouth daily.     No facility-administered medications prior to visit.      Per HPI unless specifically indicated in ROS section below Review of Systems     Objective:    BP 124/68 (BP Location: Left Arm, Patient Position: Sitting, Cuff Size: Large)   Pulse 78   Temp 98.2 F (36.8 C) (Oral)   Ht 5\' 3"  (1.6 m)   Wt 228 lb (103.4 kg)   SpO2 96%   BMI 40.39 kg/m   Wt Readings from Last 3 Encounters:  06/30/18 228 lb (103.4 kg)  06/03/18 236 lb (107 kg)  05/06/18 238 lb (108 kg)    Physical Exam Vitals signs and nursing note reviewed.  Constitutional:  General: She is not in acute distress.    Appearance: Normal appearance. She is well-developed.  HENT:     Head: Normocephalic and atraumatic.     Right Ear: Hearing, tympanic membrane, ear canal and external ear normal.     Left Ear: Hearing, tympanic membrane, ear canal and external ear normal.     Nose: Rhinorrhea (mild) present. No mucosal edema or congestion.     Right Sinus: No maxillary sinus tenderness or frontal sinus tenderness.     Left Sinus: No maxillary sinus tenderness or frontal sinus tenderness.     Mouth/Throat:     Pharynx: Uvula midline. No oropharyngeal exudate or posterior oropharyngeal erythema.     Tonsils: No tonsillar  abscesses.  Eyes:     General: No scleral icterus.    Conjunctiva/sclera: Conjunctivae normal.     Pupils: Pupils are equal, round, and reactive to light.  Neck:     Musculoskeletal: Normal range of motion and neck supple.  Cardiovascular:     Rate and Rhythm: Normal rate and regular rhythm.     Pulses: Normal pulses.     Heart sounds: Normal heart sounds. No murmur.  Pulmonary:     Effort: Pulmonary effort is normal. No respiratory distress.     Breath sounds: Normal breath sounds. No wheezing, rhonchi or rales.  Abdominal:     Tenderness: There is no right CVA tenderness or left CVA tenderness.  Lymphadenopathy:     Cervical: No cervical adenopathy.  Skin:    General: Skin is warm and dry.     Findings: No rash.  Neurological:     Mental Status: She is alert.  Psychiatric:        Mood and Affect: Mood normal.    Results for orders placed or performed in visit on 06/30/18  Urinalysis Dipstick  Result Value Ref Range   Color, UA yellow    Clarity, UA clear    Glucose, UA Negative Negative   Bilirubin, UA negative    Ketones, UA +/-    Spec Grav, UA >=1.030 (A) 1.010 - 1.025   Blood, UA negative    pH, UA 5.5 5.0 - 8.0   Protein, UA Negative Negative   Urobilinogen, UA 0.2 0.2 or 1.0 E.U./dL   Nitrite, UA negative    Leukocytes, UA Small (1+) (A) Negative   Appearance     Odor     Micro: WBC rare RBC 0 Epi 5-10 Bact tr Casts none UCx not sent   EKG - NSR rate 75, normal axis, intervals, no acute ST/T changes    Assessment & Plan:   Problem List Items Addressed This Visit    Viral URI    Towards end of course. Anticipate full recovery. No need for abx at this time.       Primary osteoarthritis of right knee   Preop examination - Primary    RCRI remains 0. Anticipate she should do well with upcoming surgery. Will proceed with labs, UA, and EKG today and fax to ortho office. Pt without other concerns today.       Relevant Orders   EKG 12-Lead  (Completed)       No orders of the defined types were placed in this encounter.  Orders Placed This Encounter  Procedures  . EKG 12-Lead    Follow up plan: No follow-ups on file.  Ria Bush, MD

## 2018-06-30 NOTE — Assessment & Plan Note (Signed)
RCRI remains 0. Anticipate she should do well with upcoming surgery. Will proceed with labs, UA, and EKG today and fax to ortho office. Pt without other concerns today.

## 2018-06-30 NOTE — Patient Instructions (Addendum)
EKG today  Labs and urinalysis today  Mandaremos resultados al ortopedista.  Que tenga una recuperacion rapida y unas felices navidades!

## 2018-07-01 ENCOUNTER — Telehealth: Payer: Self-pay

## 2018-07-01 LAB — CBC WITH DIFFERENTIAL/PLATELET
Basophils Absolute: 0 10*3/uL (ref 0.0–0.1)
Basophils Relative: 0.7 % (ref 0.0–3.0)
EOS ABS: 0.3 10*3/uL (ref 0.0–0.7)
EOS PCT: 4.5 % (ref 0.0–5.0)
HCT: 44.1 % (ref 36.0–46.0)
Hemoglobin: 14.6 g/dL (ref 12.0–15.0)
LYMPHS ABS: 2.6 10*3/uL (ref 0.7–4.0)
Lymphocytes Relative: 39.5 % (ref 12.0–46.0)
MCHC: 33 g/dL (ref 30.0–36.0)
MCV: 97.3 fl (ref 78.0–100.0)
MONO ABS: 0.6 10*3/uL (ref 0.1–1.0)
Monocytes Relative: 9.1 % (ref 3.0–12.0)
NEUTROS ABS: 3 10*3/uL (ref 1.4–7.7)
Neutrophils Relative %: 46.2 % (ref 43.0–77.0)
PLATELETS: 254 10*3/uL (ref 150.0–400.0)
RBC: 4.53 Mil/uL (ref 3.87–5.11)
RDW: 14 % (ref 11.5–15.5)
WBC: 6.5 10*3/uL (ref 4.0–10.5)

## 2018-07-01 LAB — PROTIME-INR
INR: 1 ratio (ref 0.8–1.0)
Prothrombin Time: 11.4 s (ref 9.6–13.1)

## 2018-07-01 LAB — BASIC METABOLIC PANEL
BUN: 20 mg/dL (ref 6–23)
CHLORIDE: 105 meq/L (ref 96–112)
CO2: 29 meq/L (ref 19–32)
CREATININE: 0.79 mg/dL (ref 0.40–1.20)
Calcium: 9.9 mg/dL (ref 8.4–10.5)
GFR: 79.88 mL/min (ref >60.00)
GLUCOSE: 92 mg/dL (ref 70–99)
Potassium: 4.2 mEq/L (ref 3.5–5.1)
Sodium: 142 mEq/L (ref 135–145)

## 2018-07-01 NOTE — Telephone Encounter (Signed)
Left message on vm for Roselyn Reef informing her pt's form and requested info was faxed earlier this afternoon.

## 2018-07-01 NOTE — Telephone Encounter (Signed)
Stacey Nichols from [place] request cb as soon as labs from 06/30/18 are faxed to [phone]. Pt is first case on surgery schedule on 07/02/18.  Terri in [facility] lab is making lab results a stat request.

## 2018-07-01 NOTE — Telephone Encounter (Signed)
**Note Stacey-Identified via Obfuscation** Faxed surgical clearance form, OV note, labs, EKG and UA to Stacey Nichols, of [employer], at [phone].  Attempted to inform Roselyn Reef at [phone] to let her know info has been faxed. Recording states the office is closed for lunch, 11:30- 12:30. Will try again later.

## 2018-07-29 ENCOUNTER — Ambulatory Visit (INDEPENDENT_AMBULATORY_CARE_PROVIDER_SITE_OTHER): Payer: BLUE CROSS/BLUE SHIELD | Admitting: Physician Assistant

## 2018-08-13 ENCOUNTER — Ambulatory Visit: Payer: BLUE CROSS/BLUE SHIELD

## 2018-08-19 ENCOUNTER — Ambulatory Visit (INDEPENDENT_AMBULATORY_CARE_PROVIDER_SITE_OTHER): Payer: BLUE CROSS/BLUE SHIELD | Admitting: Physician Assistant

## 2018-08-19 VITALS — BP 123/83 | HR 81 | Temp 97.8°F | Ht 63.0 in | Wt 221.0 lb

## 2018-08-19 DIAGNOSIS — E559 Vitamin D deficiency, unspecified: Secondary | ICD-10-CM

## 2018-08-19 DIAGNOSIS — Z9189 Other specified personal risk factors, not elsewhere classified: Secondary | ICD-10-CM

## 2018-08-19 DIAGNOSIS — E7849 Other hyperlipidemia: Secondary | ICD-10-CM

## 2018-08-19 DIAGNOSIS — Z6839 Body mass index (BMI) 39.0-39.9, adult: Secondary | ICD-10-CM

## 2018-08-19 MED ORDER — VITAMIN D (ERGOCALCIFEROL) 1.25 MG (50000 UNIT) PO CAPS: 50000.0000 [IU] | ORAL_CAPSULE | ORAL | 0 refills | Status: DC

## 2018-08-19 MED ORDER — ROSUVASTATIN CALCIUM 10 MG PO TABS: 10.0000 mg | ORAL_TABLET | Freq: Every day | ORAL | 3 refills | Status: DC

## 2018-08-19 NOTE — Progress Notes (Signed)
Office: [phone]  /  Fax: [phone]   HPI:   Chief Complaint: OBESITY Stacey Nichols is here to discuss her progress with her obesity treatment plan. She is on the  follow the Category 2 plan + 200 calories and is following her eating plan approximately 50% of the time. She states she is exercising 0 minutes 0 times per week. Deneshia did well with weight loss. She was in [place] for 3 weeks and ate only fruits and vegetables. She also recently had a knee replacement. She is eating more fish, fruits, and vegetables. Her weight is 221 lb (100.2 kg) today and has had a weight loss of 15 pounds over a period of 11 weeks since her last visit. She has lost 25 lbs since starting treatment with [place].  Hyperlipidemia Stacey Nichols has hyperlipidemia and has been trying to improve her cholesterol levels with intensive lifestyle modification including a low saturated fat diet, exercise and weight loss. She denies any chest pain. Stacey Nichols is on Crestor.  Vitamin D deficiency Stacey Nichols has a diagnosis of Vitamin D deficiency. She has not been taking Vitamin D. She denies nausea, vomiting or muscle weakness.  At risk for cardiovascular disease Stacey Nichols is at a higher than average risk for cardiovascular disease due to obesity. She currently denies any chest pain.  ASSESSMENT AND PLAN:  Other hyperlipidemia - Plan: rosuvastatin (CRESTOR) 10 MG tablet  Vitamin D deficiency - Plan: Vitamin D, Ergocalciferol, (DRISDOL) 1.25 MG (50000 UT) CAPS capsule  At risk for heart disease  Class 2 severe obesity with serious comorbidity and body mass index (BMI) of 39.0 to 39.9 in adult, unspecified obesity type ([place])  PLAN:  Hyperlipidemia Stacey Nichols was informed of the American Heart Association Guidelines emphasizing intensive lifestyle modifications as the first line treatment for hyperlipidemia. We discussed many lifestyle modifications today in depth, and Terree will continue to work on decreasing saturated fats such as fatty red meat,  butter and many fried foods. She will also increase vegetables and lean protein in her diet and continue to work on exercise and weight loss efforts. Rashaun was given a refill on rosuvastatin #30 with no refills. Stacey Nichols agrees to follow-up with our clinic in 2 weeks.   Vitamin D Deficiency Stacey Nichols was informed that low Vitamin D levels contributes to fatigue and are associated with obesity, breast, and colon cancer. Stacey Nichols agrees to take prescription Vitamin D @ 50,000 IU every week #4 with no refills and will follow up for routine testing of Vitamin D, at least 2-3 times per year. She was informed of the risk of over-replacement of Vitamin D and agrees to not increase her dose unless she discusses this with [place] first. Stacey Nichols agrees to follow-up with our clinic in 2 weeks.  Cardiovascular risk counseling Stacey Nichols was given extended (15 minutes) coronary artery disease prevention counseling today. She is 58 y.o. female and has risk factors for heart disease including obesity. We discussed intensive lifestyle modifications today with an emphasis on specific weight loss instructions and strategies. Pt was also informed of the importance of increasing exercise and decreasing saturated fats to help prevent heart disease.  Obesity Stacey Nichols is currently in the action stage of change. As such, her goal is to continue with weight loss efforts. She has agreed to follow the Category 2 plan + 200 calories. Stacey Nichols has been instructed to work up to a goal of 150 minutes of combined cardio and strengthening exercise per week for weight loss and overall health benefits. We discussed the following  Behavioral Modification Strategies today: increasing lean protein intake and work on meal planning and easy cooking plans.  Stacey Nichols has agreed to follow up with our clinic in 2 weeks. She was informed of the importance of frequent follow up visits to maximize her success with intensive lifestyle modifications for her multiple health  conditions.  ALLERGIES: Allergies  Allergen Reactions  . Latex Other (See Comments) and Itching  . Sulfa Antibiotics Rash    [Rash] rash - paper tape OK  . Tape Rash    [Rash] rash - paper tape OK    MEDICATIONS: Current Outpatient Medications on File Prior to Visit  Medication Sig Dispense Refill  . acetaminophen (TYLENOL) 500 MG tablet Take 500 mg by mouth every 6 (six) hours as needed.    Marland Kitchen aspirin 81 MG tablet Take 81 mg by mouth daily.    . Calcium Citrate (CALCITRATE PO) Take 1 tablet by mouth daily.    Marland Kitchen guaiFENesin-codeine (CHERATUSSIN AC) 100-10 MG/5ML syrup Take 5 mLs by mouth 2 (two) times daily as needed for cough (sedation precautions). 120 mL 0  . metFORMIN (GLUCOPHAGE) 500 MG tablet Take 1 tablet (500 mg total) by mouth 2 (two) times daily with a meal. 60 tablet 1  . Multiple Vitamins-Minerals (MULTI COMPLETE PO) Take 1 tablet by mouth daily.    . naproxen (NAPROSYN) 500 MG tablet Take 500 mg by mouth daily at 12 noon.    . vitamin B-12 (CYANOCOBALAMIN) 250 MCG tablet Take 250 mcg by mouth daily.     No current facility-administered medications on file prior to visit.     PAST MEDICAL HISTORY: Past Medical History:  Diagnosis Date  . 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 Anemia    hx of  . Anxiety   . Arthritis    severe  . Constipation   . Depression   . GERD (gastroesophageal reflux disease)   . H/O Bell's palsy   . H/O hiatal hernia   . Headache    occasional  . High triglycerides   . History of cancer chemotherapy   . Leukemia ([place])   . Osteopenia 06/08/2017   DEXA 2017 - T -1.8 spine  . Pneumonia    hx of   . Rheumatoid arthritis ([place])   . Sleep apnea   . Stroke ([place])    "mini stroke-caused bells palsy for 1 month"    PAST SURGICAL HISTORY: Past Surgical History:  Procedure Laterality Date  . ABDOMINAL HYSTERECTOMY  2001   heavy bleeding, precancerous cells, 1 ovary remains  . LAPAROSCOPIC GASTRIC BAND  REMOVAL WITH LAPAROSCOPIC GASTRIC SLEEVE RESECTION  04/26/2014  . LAPAROSCOPIC GASTRIC BANDING  2000   performed in [place]  . TUBAL LIGATION  1996  . UPPER GI ENDOSCOPY N/A 04/26/2014   Procedure: UPPER GI ENDOSCOPY;  Surgeon: Pedro Earls, MD;  Location: [facility];  Service: General;  Laterality: N/A;    SOCIAL HISTORY: Social History   Tobacco Use  . Smoking status: Former Smoker    Years: 30.00    Types: Cigarettes    Last attempt to quit: 03/30/2018    Years since quitting: 0.3  . Smokeless tobacco: Never Used  Substance Use Topics  . Alcohol use: Yes    Comment: occasional  . Drug use: No    FAMILY HISTORY: Family History  Problem Relation Age of Onset  . Cancer Mother        cervix  . Hyperlipidemia Father   .  Obesity Father   . CAD Father        MI and arrhythmia  . Cancer Sister        metastatic, ?pancrease primary  . Diabetes Maternal Grandfather    ROS: Review of Systems  Constitutional: Positive for weight loss.  Cardiovascular: Negative for chest pain.  Gastrointestinal: Negative for nausea and vomiting.  Musculoskeletal:       Negative muscle weakness   PHYSICAL EXAM: Blood pressure 123/83, pulse 81, temperature 97.8 F (36.6 C), temperature source Oral, height 5\' 3"  (1.6 m), weight 221 lb (100.2 kg), SpO2 97 %. Body mass index is 39.15 kg/m. Physical Exam Vitals signs reviewed.  Constitutional:      Appearance: Normal appearance. She is obese.  Cardiovascular:     Rate and Rhythm: Normal rate.     Pulses: Normal pulses.  Pulmonary:     Effort: Pulmonary effort is normal.     Breath sounds: Normal breath sounds.  Musculoskeletal: Normal range of motion.  Skin:    General: Skin is warm and dry.  Neurological:     Mental Status: She is alert and oriented to person, place, and time.  Psychiatric:        Mood and Affect: Mood normal.        Behavior: Behavior normal.   RECENT LABS AND TESTS: BMET    Component Value Date/Time   NA 142  06/30/2018 1534   NA 144 09/04/2017 1055   NA 142 01/14/2013 1757   K 4.2 06/30/2018 1534   K 4.6 01/14/2013 1757   CL 105 06/30/2018 1534   CL 107 01/14/2013 1757   CO2 29 06/30/2018 1534   CO2 29 01/14/2013 1757   GLUCOSE 92 06/30/2018 1534   GLUCOSE 95 01/14/2013 1757   BUN 20 06/30/2018 1534   BUN 18 09/04/2017 1055   BUN 11 01/14/2013 1757   CREATININE 0.79 06/30/2018 1534   CREATININE 0.76 12/25/2016 1201   CALCIUM 9.9 06/30/2018 1534   CALCIUM 9.2 01/14/2013 1757   GFRNONAA 100 09/04/2017 1055   GFRNONAA 89 12/25/2016 1201   GFRAA 115 09/04/2017 1055   GFRAA >89 12/25/2016 1201   Lab Results  Component Value Date   HGBA1C 5.9 12/26/2017   HGBA1C 5.9 (H) 09/04/2017   HGBA1C 5.9 (H) 05/05/2017   HGBA1C 6.0 (H) 01/21/2017   HGBA1C 6.0 05/27/2016   Lab Results  Component Value Date   INSULIN 33.6 (H) 09/04/2017   INSULIN 26.0 (H) 05/05/2017   INSULIN 42.1 (H) 01/21/2017   CBC    Component Value Date/Time   WBC 6.5 06/30/2018 1534   RBC 4.53 06/30/2018 1534   HGB 14.6 06/30/2018 1534   HGB 14.5 09/04/2017 1055   HCT 44.1 06/30/2018 1534   HCT 42.8 09/04/2017 1055   PLT 254.0 06/30/2018 1534   PLT 214 06/20/2015 1141   MCV 97.3 06/30/2018 1534   MCV 98 (H) 09/04/2017 1055   MCV 96 01/15/2013 0522   MCH 33.0 09/04/2017 1055   MCH 32.6 08/27/2016 0904   MCHC 33.0 06/30/2018 1534   RDW 14.0 06/30/2018 1534   RDW 13.8 09/04/2017 1055   RDW 14.3 01/15/2013 0522   LYMPHSABS 2.6 06/30/2018 1534   LYMPHSABS 2.3 09/04/2017 1055   LYMPHSABS 3.6 01/15/2013 0522   MONOABS 0.6 06/30/2018 1534   MONOABS 0.8 01/15/2013 0522   EOSABS 0.3 06/30/2018 1534   EOSABS 0.2 09/04/2017 1055   EOSABS 0.3 01/15/2013 0522   BASOSABS 0.0 06/30/2018 1534  BASOSABS 0.0 09/04/2017 1055   BASOSABS 0.0 01/15/2013 0522   Iron/TIBC/Ferritin/ %Sat No results found for: IRON, TIBC, FERRITIN, IRONPCTSAT Lipid Panel     Component Value Date/Time   CHOL 153 12/26/2017 0918   CHOL  155 09/04/2017 1055   CHOL 190 01/15/2013 0522   TRIG 204.0 (H) 12/26/2017 0918   TRIG 307 (H) 01/15/2013 0522   HDL 57.70 12/26/2017 0918   HDL 57 09/04/2017 1055   HDL 44 01/15/2013 0522   CHOLHDL 3 12/26/2017 0918   VLDL 40.8 (H) 12/26/2017 0918   VLDL 61 (H) 01/15/2013 0522   LDLCALC 62 09/04/2017 1055   LDLCALC 85 01/15/2013 0522   LDLDIRECT 72.0 12/26/2017 0918   Hepatic Function Panel     Component Value Date/Time   PROT 6.8 12/26/2017 0918   PROT 7.3 09/04/2017 1055   PROT 7.7 01/14/2013 1757   ALBUMIN 4.1 12/26/2017 0918   ALBUMIN 4.7 09/04/2017 1055   ALBUMIN 3.8 01/14/2013 1757   AST 16 12/26/2017 0918   AST 25 01/14/2013 1757   ALT 18 12/26/2017 0918   ALT 39 01/14/2013 1757   ALKPHOS 73 12/26/2017 0918   ALKPHOS 100 01/14/2013 1757   BILITOT 0.4 12/26/2017 0918   BILITOT 0.3 09/04/2017 1055   BILITOT 0.3 01/14/2013 1757      Component Value Date/Time   TSH 3.850 01/21/2017 1128   TSH 2.09 12/25/2016 1201   TSH 3.163 03/01/2014 1201   Results for KHANIYA, TENAGLIA (MRN [id number]) as of 08/19/2018 15:08  Ref. Range 02/05/2018 11:41  Vitamin D, 25-Hydroxy Latest Ref Range: 30.0 - 100.0 ng/mL 33.0   OBESITY BEHAVIORAL INTERVENTION VISIT  Today's visit was #27   Starting weight: 246 lbs Starting date: 01/21/2017 Today's weight: 221 lbs Today's date: 08/19/2018 Total lbs lost to date: 25  ASK: We discussed the diagnosis of obesity with Stacey Nichols today and Stacey Nichols agreed to give [place] permission to discuss obesity behavioral modification therapy today.  ASSESS: Stacey Nichols has the diagnosis of obesity and her BMI today is 39.15. Stacey Nichols is in the action stage of change.  ADVISE: Stacey Nichols was educated on the multiple health risks of obesity as well as the benefit of weight loss to improve her health. She was advised of the need for long term treatment and the importance of lifestyle modifications to improve her current health and to decrease her risk of future health  problems.  AGREE: Multiple dietary modification options and treatment options were discussed and  Stacey Nichols agreed to follow the recommendations documented in the above note.  ARRANGE: Stacey Nichols was educated on the importance of frequent visits to treat obesity as outlined per CMS and USPSTF guidelines and agreed to schedule her next follow up appointment today.  Stacey Nichols, am acting as transcriptionist for Abby Potash, PA-C I, Abby Potash, PA-C have reviewed above note and agree with its content

## 2018-09-02 ENCOUNTER — Ambulatory Visit (INDEPENDENT_AMBULATORY_CARE_PROVIDER_SITE_OTHER): Payer: BLUE CROSS/BLUE SHIELD | Admitting: Physician Assistant

## 2018-09-02 VITALS — BP 116/79 | HR 79 | Temp 97.7°F | Ht 63.0 in | Wt 224.0 lb

## 2018-09-02 DIAGNOSIS — E7849 Other hyperlipidemia: Secondary | ICD-10-CM

## 2018-09-02 DIAGNOSIS — Z9189 Other specified personal risk factors, not elsewhere classified: Secondary | ICD-10-CM | POA: Diagnosis not present

## 2018-09-02 DIAGNOSIS — Z6839 Body mass index (BMI) 39.0-39.9, adult: Secondary | ICD-10-CM | POA: Diagnosis not present

## 2018-09-02 DIAGNOSIS — E559 Vitamin D deficiency, unspecified: Secondary | ICD-10-CM | POA: Diagnosis not present

## 2018-09-02 DIAGNOSIS — R7303 Prediabetes: Secondary | ICD-10-CM

## 2018-09-02 MED ORDER — METFORMIN HCL 500 MG PO TABS: 500.0000 mg | ORAL_TABLET | Freq: Two times a day (BID) | ORAL | 0 refills | Status: DC

## 2018-09-02 NOTE — Progress Notes (Signed)
Office: [phone]  /  Fax: [phone]   HPI:   Chief Complaint: OBESITY Stacey Nichols is here to discuss her progress with her obesity treatment plan. She is on the Category 2 plan and is following her eating plan approximately 50% of the time. She states she is walking 20 minutes 3 times per week. Jozette reports that she has been eating 5 fruits daily and under eating her protein at dinner. She denies excessive hunger.  Her weight is 224 lb (101.6 kg) today and has had a weight gain of 3 lbs since her last visit. She has lost 22 lbs since starting treatment with [place].  Vitamin D deficiency Stacey Nichols has a diagnosis of Vitamin D deficiency. She is currently taking Vit D and denies nausea, vomiting or muscle weakness.  Pre-Diabetes Stacey Nichols has a diagnosis of prediabetes based on her elevated HgA1c and was informed this puts her at greater risk of developing diabetes. She is taking metformin currently and continues to work on diet and exercise to decrease risk of diabetes. She denies nausea, vomiting, diarrhea, or polyphagia.  At risk for diabetes Stacey Nichols is at higher than averagerisk for developing diabetes due to her obesity. She currently denies polyuria or polydipsia.  Hyperlipidemia Stacey Nichols has hyperlipidemia and has been trying to improve her cholesterol levels with intensive lifestyle modification including a low saturated fat diet, exercise and weight loss. Stacey Nichols is taking Crestor. She denies any chest pain.  ASSESSMENT AND PLAN:  Vitamin D deficiency - Plan: VITAMIN D 25 Hydroxy (Vit-D Deficiency, Fractures)  Prediabetes - Plan: Hemoglobin A1c, Insulin, random, metFORMIN (GLUCOPHAGE) 500 MG tablet  Other hyperlipidemia - Plan: Lipid Panel With LDL/HDL Ratio  At risk for diabetes mellitus  Class 2 severe obesity with serious comorbidity and body mass index (BMI) of 39.0 to 39.9 in adult, unspecified obesity type ([place])  PLAN:  Vitamin D Deficiency Stacey Nichols was informed that low Vitamin D  levels contributes to fatigue and are associated with obesity, breast, and colon cancer. She agrees to continue taking Vit D and will follow-up for routine testing of Vitamin D, at least 2-3 times per year. She was informed of the risk of over-replacement of Vitamin D and agrees to not increase her dose unless she discusses this with [place] first. Stacey Nichols agrees to follow-up with our clinic in 2 weeks.  Pre-Diabetes Stacey Nichols will continue to work on weight loss, exercise, and decreasing simple carbohydrates in her diet to help decrease the risk of diabetes. We dicussed metformin including benefits and risks. She was informed that eating too many simple carbohydrates or too many calories at one sitting increases the likelihood of GI side effects. Stacey Nichols is on metformin and a prescription was written today for #60 with no refills. We will check labs today and Stacey Nichols agrees to follow-up with [place] as directed to monitor her progress.  Diabetes risk counseling Stacey Nichols was given extended (15 minutes) diabetes prevention counseling today. She is 58 y.o. female and has risk factors for diabetes including obesity. We discussed intensive lifestyle modifications today with an emphasis on weight loss as well as increasing exercise and decreasing simple carbohydrates in her diet.  Hyperlipidemia Stacey Nichols was informed of the American Heart Association Guidelines emphasizing intensive lifestyle modifications as the first line treatment for hyperlipidemia. We discussed many lifestyle modifications today in depth, and Stacey Nichols will continue to work on decreasing saturated fats such as fatty red meat, butter and many fried foods. She will also increase vegetables and lean protein in her diet and  continue to work on exercise and weight loss efforts. Stacey Nichols will continue taking Crestor.  Obesity Stacey Nichols is currently in the action stage of change. As such, her goal is to continue with weight loss efforts. She has agreed to follow the Category 2  plan. Stacey Nichols has been instructed to work up to a goal of 150 minutes of combined cardio and strengthening exercise per week for weight loss and overall health benefits. We discussed the following Behavioral Modification Strategies today: increasing lean protein intake and decreasing simple carbohydrates.   Stacey Nichols has agreed to follow-up with our clinic in 2 weeks. She was informed of the importance of frequent follow up visits to maximize her success with intensive lifestyle modifications for her multiple health conditions.  ALLERGIES: Allergies  Allergen Reactions  . Latex Other (See Comments) and Itching  . Sulfa Antibiotics Rash    [Rash] rash - paper tape OK  . Tape Rash    [Rash] rash - paper tape OK    MEDICATIONS: Current Outpatient Medications on File Prior to Visit  Medication Sig Dispense Refill  . acetaminophen (TYLENOL) 500 MG tablet Take 500 mg by mouth every 6 (six) hours as needed.    Marland Kitchen aspirin 81 MG tablet Take 81 mg by mouth daily.    . Calcium Citrate (CALCITRATE PO) Take 1 tablet by mouth daily.    Marland Kitchen guaiFENesin-codeine (CHERATUSSIN AC) 100-10 MG/5ML syrup Take 5 mLs by mouth 2 (two) times daily as needed for cough (sedation precautions). 120 mL 0  . Multiple Vitamins-Minerals (MULTI COMPLETE PO) Take 1 tablet by mouth daily.    . naproxen (NAPROSYN) 500 MG tablet Take 500 mg by mouth daily at 12 noon.    . rosuvastatin (CRESTOR) 10 MG tablet Take 1 tablet (10 mg total) by mouth at bedtime. 90 tablet 3  . vitamin B-12 (CYANOCOBALAMIN) 250 MCG tablet Take 250 mcg by mouth daily.    . Vitamin D, Ergocalciferol, (DRISDOL) 1.25 MG (50000 UT) CAPS capsule Take 1 capsule (50,000 Units total) by mouth every 7 (seven) days. 4 capsule 0   No current facility-administered medications on file prior to visit.     PAST MEDICAL HISTORY: Past Medical History:  Diagnosis Date  . 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 Anemia    hx of  . Anxiety     . Arthritis    severe  . Constipation   . Depression   . GERD (gastroesophageal reflux disease)   . H/O Bell's palsy   . H/O hiatal hernia   . Headache    occasional  . High triglycerides   . History of cancer chemotherapy   . Leukemia ([place])   . Osteopenia 06/08/2017   DEXA 2017 - T -1.8 spine  . Pneumonia    hx of   . Rheumatoid arthritis ([place])   . Sleep apnea   . Stroke ([place])    "mini stroke-caused bells palsy for 1 month"    PAST SURGICAL HISTORY: Past Surgical History:  Procedure Laterality Date  . ABDOMINAL HYSTERECTOMY  2001   heavy bleeding, precancerous cells, 1 ovary remains  . LAPAROSCOPIC GASTRIC BAND REMOVAL WITH LAPAROSCOPIC GASTRIC SLEEVE RESECTION  04/26/2014  . LAPAROSCOPIC GASTRIC BANDING  2000   performed in [place]  . TUBAL LIGATION  1996  . UPPER GI ENDOSCOPY N/A 04/26/2014   Procedure: UPPER GI ENDOSCOPY;  Surgeon: Pedro Earls, MD;  Location: [facility];  Service: General;  Laterality: N/A;  SOCIAL HISTORY: Social History   Tobacco Use  . Smoking status: Former Smoker    Years: 30.00    Types: Cigarettes    Last attempt to quit: 03/30/2018    Years since quitting: 0.4  . Smokeless tobacco: Never Used  Substance Use Topics  . Alcohol use: Yes    Comment: occasional  . Drug use: No    FAMILY HISTORY: Family History  Problem Relation Age of Onset  . Cancer Mother        cervix  . Hyperlipidemia Father   . Obesity Father   . CAD Father        MI and arrhythmia  . Cancer Sister        metastatic, ?pancrease primary  . Diabetes Maternal Grandfather    ROS: Review of Systems  Constitutional: Negative for weight loss.  Cardiovascular: Negative for chest pain.  Gastrointestinal: Negative for diarrhea, nausea and vomiting.  Musculoskeletal:       Negative for muscle weakness.  Endo/Heme/Allergies:       Negative for polyphagia. Negative for hypoglycemia.   PHYSICAL EXAM: Blood pressure 116/79, pulse 79, temperature 97.7 F  (36.5 C), height 5\' 3"  (1.6 m), weight 224 lb (101.6 kg), SpO2 96 %. Body mass index is 39.68 kg/m. Physical Exam Vitals signs reviewed.  Constitutional:      Appearance: Normal appearance. She is obese.  Cardiovascular:     Rate and Rhythm: Normal rate.     Pulses: Normal pulses.  Pulmonary:     Effort: Pulmonary effort is normal.     Breath sounds: Normal breath sounds.  Musculoskeletal: Normal range of motion.  Skin:    General: Skin is warm and dry.  Neurological:     Mental Status: She is alert and oriented to person, place, and time.  Psychiatric:        Behavior: Behavior normal.   RECENT LABS AND TESTS: BMET    Component Value Date/Time   NA 142 06/30/2018 1534   NA 144 09/04/2017 1055   NA 142 01/14/2013 1757   K 4.2 06/30/2018 1534   K 4.6 01/14/2013 1757   CL 105 06/30/2018 1534   CL 107 01/14/2013 1757   CO2 29 06/30/2018 1534   CO2 29 01/14/2013 1757   GLUCOSE 92 06/30/2018 1534   GLUCOSE 95 01/14/2013 1757   BUN 20 06/30/2018 1534   BUN 18 09/04/2017 1055   BUN 11 01/14/2013 1757   CREATININE 0.79 06/30/2018 1534   CREATININE 0.76 12/25/2016 1201   CALCIUM 9.9 06/30/2018 1534   CALCIUM 9.2 01/14/2013 1757   GFRNONAA 100 09/04/2017 1055   GFRNONAA 89 12/25/2016 1201   GFRAA 115 09/04/2017 1055   GFRAA >89 12/25/2016 1201   Lab Results  Component Value Date   HGBA1C 5.9 12/26/2017   HGBA1C 5.9 (H) 09/04/2017   HGBA1C 5.9 (H) 05/05/2017   HGBA1C 6.0 (H) 01/21/2017   HGBA1C 6.0 05/27/2016   Lab Results  Component Value Date   INSULIN 33.6 (H) 09/04/2017   INSULIN 26.0 (H) 05/05/2017   INSULIN 42.1 (H) 01/21/2017   CBC    Component Value Date/Time   WBC 6.5 06/30/2018 1534   RBC 4.53 06/30/2018 1534   HGB 14.6 06/30/2018 1534   HGB 14.5 09/04/2017 1055   HCT 44.1 06/30/2018 1534   HCT 42.8 09/04/2017 1055   PLT 254.0 06/30/2018 1534   PLT 214 06/20/2015 1141   MCV 97.3 06/30/2018 1534   MCV 98 (H) 09/04/2017 1055  MCV 96 01/15/2013  0522   MCH 33.0 09/04/2017 1055   MCH 32.6 08/27/2016 0904   MCHC 33.0 06/30/2018 1534   RDW 14.0 06/30/2018 1534   RDW 13.8 09/04/2017 1055   RDW 14.3 01/15/2013 0522   LYMPHSABS 2.6 06/30/2018 1534   LYMPHSABS 2.3 09/04/2017 1055   LYMPHSABS 3.6 01/15/2013 0522   MONOABS 0.6 06/30/2018 1534   MONOABS 0.8 01/15/2013 0522   EOSABS 0.3 06/30/2018 1534   EOSABS 0.2 09/04/2017 1055   EOSABS 0.3 01/15/2013 0522   BASOSABS 0.0 06/30/2018 1534   BASOSABS 0.0 09/04/2017 1055   BASOSABS 0.0 01/15/2013 0522   Iron/TIBC/Ferritin/ %Sat No results found for: IRON, TIBC, FERRITIN, IRONPCTSAT Lipid Panel     Component Value Date/Time   CHOL 153 12/26/2017 0918   CHOL 155 09/04/2017 1055   CHOL 190 01/15/2013 0522   TRIG 204.0 (H) 12/26/2017 0918   TRIG 307 (H) 01/15/2013 0522   HDL 57.70 12/26/2017 0918   HDL 57 09/04/2017 1055   HDL 44 01/15/2013 0522   CHOLHDL 3 12/26/2017 0918   VLDL 40.8 (H) 12/26/2017 0918   VLDL 61 (H) 01/15/2013 0522   LDLCALC 62 09/04/2017 1055   LDLCALC 85 01/15/2013 0522   LDLDIRECT 72.0 12/26/2017 0918   Hepatic Function Panel     Component Value Date/Time   PROT 6.8 12/26/2017 0918   PROT 7.3 09/04/2017 1055   PROT 7.7 01/14/2013 1757   ALBUMIN 4.1 12/26/2017 0918   ALBUMIN 4.7 09/04/2017 1055   ALBUMIN 3.8 01/14/2013 1757   AST 16 12/26/2017 0918   AST 25 01/14/2013 1757   ALT 18 12/26/2017 0918   ALT 39 01/14/2013 1757   ALKPHOS 73 12/26/2017 0918   ALKPHOS 100 01/14/2013 1757   BILITOT 0.4 12/26/2017 0918   BILITOT 0.3 09/04/2017 1055   BILITOT 0.3 01/14/2013 1757      Component Value Date/Time   TSH 3.850 01/21/2017 1128   TSH 2.09 12/25/2016 1201   TSH 3.163 03/01/2014 1201    Ref. Range 02/05/2018 11:41  Vitamin D, 25-Hydroxy Latest Ref Range: 30.0 - 100.0 ng/mL 33.0    OBESITY BEHAVIORAL INTERVENTION VISIT  Today's visit was #28  Starting weight: 246 lbs Starting date: 01/21/2017 Today's weight: 224 lbs Today's date:  09/02/2018 Total lbs lost to date: 22  ASK: We discussed the diagnosis of obesity with Stacey Nichols today and Stacey Nichols agreed to give [place] permission to discuss obesity behavioral modification therapy today.  ASSESS: Stacey Nichols has the diagnosis of obesity and her BMI today is 39.68. Stacey Nichols is in the action stage of change.   ADVISE: Stacey Nichols was educated on the multiple health risks of obesity as well as the benefit of weight loss to improve her health. She was advised of the need for long term treatment and the importance of lifestyle modifications to improve her current health and to decrease her risk of future health problems.  AGREE: Multiple dietary modification options and treatment options were discussed and  Stacey Nichols agreed to follow the recommendations documented in the above note.  ARRANGE: Stacey Nichols was educated on the importance of frequent visits to treat obesity as outlined per CMS and USPSTF guidelines and agreed to schedule her next follow up appointment today.  Migdalia Dk, am acting as transcriptionist for Abby Potash, PA-C I, Abby Potash, PA-C have reviewed above note and agree with its content

## 2018-09-03 ENCOUNTER — Encounter: Payer: BLUE CROSS/BLUE SHIELD | Admitting: Family Medicine

## 2018-09-03 LAB — LIPID PANEL WITH LDL/HDL RATIO
Cholesterol, Total: 153 mg/dL (ref 100–199)
HDL: 60 mg/dL (ref >39)
LDL Calculated: 61 mg/dL (ref 0–99)
LDl/HDL Ratio: 1 ratio (ref 0.0–3.2)
Triglycerides: 159 mg/dL — ABNORMAL HIGH (ref 0–149)
VLDL Cholesterol Cal: 32 mg/dL (ref 5–40)

## 2018-09-03 LAB — HEMOGLOBIN A1C
Est. average glucose Bld gHb Est-mCnc: 120 mg/dL
Hgb A1c MFr Bld: 5.8 % — ABNORMAL HIGH (ref 4.8–5.6)

## 2018-09-03 LAB — VITAMIN D 25 HYDROXY (VIT D DEFICIENCY, FRACTURES): Vit D, 25-Hydroxy: 22.9 ng/mL — ABNORMAL LOW (ref 30.0–100.0)

## 2018-09-03 LAB — INSULIN, RANDOM: INSULIN: 30.9 u[IU]/mL — AB (ref 2.6–24.9)

## 2018-09-10 ENCOUNTER — Encounter: Payer: Self-pay | Admitting: Family Medicine

## 2018-09-10 NOTE — Progress Notes (Deleted)
There were no vitals taken for this visit.   CC: 6 mo f/u visit Subjective:    Patient ID: Stacey Nichols, female    DOB: 1961-01-29, 58 y.o.   MRN: [id number]  HPI: Stacey Nichols is a 58 y.o. female presenting on 09/11/2018 for No chief complaint on file.   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   [facility].        Relevant past medical, surgical, family and social history reviewed and updated as indicated. Interim medical history since our last visit reviewed. Allergies and medications reviewed and updated. Outpatient Medications Prior to Visit  Medication Sig Dispense Refill  . acetaminophen (TYLENOL) 500 MG tablet Take 500 mg by mouth every 6 (six) hours as needed.    Marland Kitchen aspirin 81 MG tablet Take 81 mg by mouth daily.    . Calcium Citrate (CALCITRATE PO) Take 1 tablet by mouth daily.    Marland Kitchen guaiFENesin-codeine (CHERATUSSIN AC) 100-10 MG/5ML syrup Take 5 mLs by mouth 2 (two) times daily as needed for cough (sedation precautions). 120 mL 0  . metFORMIN (GLUCOPHAGE) 500 MG tablet Take 1 tablet (500 mg total) by mouth 2 (two) times daily with a meal. 60 tablet 0  . Multiple Vitamins-Minerals (MULTI COMPLETE PO) Take 1 tablet by mouth daily.    . naproxen (NAPROSYN) 500 MG tablet Take 500 mg by mouth daily at 12 noon.    . rosuvastatin (CRESTOR) 10 MG tablet Take 1 tablet (10 mg total) by mouth at bedtime. 90 tablet 3  . vitamin B-12 (CYANOCOBALAMIN) 250 MCG tablet Take 250 mcg by mouth daily.    . Vitamin D, Ergocalciferol, (DRISDOL) 1.25 MG (50000 UT) CAPS capsule Take 1 capsule (50,000 Units total) by mouth every 7 (seven) days. 4 capsule 0   No facility-administered medications prior to visit.      Per HPI unless specifically indicated in ROS section below Review of Systems Objective:    There were no vitals taken for this visit.    Wt Readings from Last 3 Encounters:  09/02/18 224 lb (101.6 kg)  08/19/18 221 lb (100.2 kg)  06/30/18 228 lb (103.4 kg)    Physical Exam    Results for orders placed or performed in visit on 09/02/18  Hemoglobin A1c  Result Value Ref Range   Hgb A1c MFr Bld 5.8 (H) 4.8 - 5.6 %   Est. average glucose Bld gHb Est-mCnc 120 mg/dL  Insulin, random  Result Value Ref Range   INSULIN 30.9 (H) 2.6 - 24.9 uIU/mL  Lipid Panel With LDL/HDL Ratio  Result Value Ref Range   Cholesterol, Total 153 100 - 199 mg/dL   Triglycerides 159 (H) 0 - 149 mg/dL   HDL 60 >39 mg/dL   VLDL Cholesterol Cal 32 5 - 40 mg/dL   LDL Calculated 61 0 - 99 mg/dL   LDl/HDL Ratio 1.0 0.0 - 3.2 ratio  VITAMIN D 25 Hydroxy (Vit-D Deficiency, Fractures)  Result Value Ref Range   Vit D, 25-Hydroxy 22.9 (L) 30.0 - 100.0 ng/mL   Assessment & Plan:   Problem List Items Addressed This Visit    None       No orders of the defined types were placed in this encounter.  No orders of the defined types were placed in this encounter.   Follow up  plan: No follow-ups on file.  Ria Bush, MD

## 2018-09-11 ENCOUNTER — Encounter: Payer: BLUE CROSS/BLUE SHIELD | Admitting: Family Medicine

## 2018-09-16 ENCOUNTER — Ambulatory Visit (INDEPENDENT_AMBULATORY_CARE_PROVIDER_SITE_OTHER): Payer: BLUE CROSS/BLUE SHIELD | Admitting: Physician Assistant

## 2018-09-16 VITALS — BP 115/80 | HR 69 | Temp 97.8°F | Ht 63.0 in | Wt 227.0 lb

## 2018-09-16 DIAGNOSIS — E559 Vitamin D deficiency, unspecified: Secondary | ICD-10-CM | POA: Diagnosis not present

## 2018-09-16 DIAGNOSIS — Z6841 Body Mass Index (BMI) 40.0 and over, adult: Secondary | ICD-10-CM

## 2018-09-16 DIAGNOSIS — Z9189 Other specified personal risk factors, not elsewhere classified: Secondary | ICD-10-CM | POA: Diagnosis not present

## 2018-09-16 DIAGNOSIS — R7303 Prediabetes: Secondary | ICD-10-CM | POA: Diagnosis not present

## 2018-09-16 MED ORDER — VITAMIN D (ERGOCALCIFEROL) 1.25 MG (50000 UNIT) PO CAPS: 50000.0000 [IU] | ORAL_CAPSULE | ORAL | 0 refills | Status: DC

## 2018-09-16 NOTE — Progress Notes (Signed)
Office: [phone]  /  Fax: [phone]   HPI:   Chief Complaint: OBESITY Stacey Nichols is here to discuss her progress with her obesity treatment plan. She is on the Category 2 vegetarian plan and is following her eating plan approximately 50% of the time. She states she is exercising 0 minutes 0 times per week. Stacey Nichols reports that she has not been eating on plan. She is eating tortillas, extra bread, and extra fruit. She is ready to get back on track. Her weight is 227 lb (103 kg) today and has had a weight gain of 3 lbs since her last visit. She has lost 19 lbs since starting treatment with [place].  Vitamin D deficiency Stacey Nichols has a diagnosis of Vitamin D deficiency. She is currently taking prescription Vit D and reports that she missed 4 doses. She denies nausea, vomiting or muscle weakness.  At risk for osteopenia and osteoporosis Stacey Nichols is at higher risk of osteopenia and osteoporosis due to Vvitamin D deficiency.   Pre-Diabetes Stacey Nichols has a diagnosis of prediabetes based on her elevated Hgb A1c and was informed this puts her at greater risk of developing diabetes. Her last A1c was reported at 5.8 on 09/02/2018. She is taking metformin currently and continues to work on diet and exercise to decrease risk of diabetes. She denies nausea, vomiting, diarrhea, or polyphagia.   ASSESSMENT AND PLAN:  Vitamin D deficiency - Plan: Vitamin D, Ergocalciferol, (DRISDOL) 1.25 MG (50000 UT) CAPS capsule  Prediabetes  At risk for osteoporosis  Class 3 severe obesity with serious comorbidity and body mass index (BMI) of 40.0 to 44.9 in adult, unspecified obesity type ([place])  PLAN:  Vitamin D Deficiency Stacey Nichols was informed that low Vitamin D levels contributes to fatigue and are associated with obesity, breast, and colon cancer. She agrees to continue to take prescription Vit D @ 50,000 IU every week #4 with 0 refills and will follow-up for routine testing of Vitamin D, at least 2-3 times per year. She was  informed of the risk of over-replacement of Vitamin D and agrees to not increase her dose unless she discusses this with [place] first. Stacey Nichols agrees to follow-up with our clinic in 3 weeks.  At risk for osteopenia and osteoporosis Stacey Nichols was given extended  (15 minutes) osteoporosis prevention counseling today. Stacey Nichols is at risk for osteopenia and osteoporsis due to her Vitamin D deficiency. She was encouraged to take her Vitamin D and follow her higher calcium diet and increase strengthening exercise to help strengthen her bones and decrease her risk of osteopenia and osteoporosis.  Pre-Diabetes Stacey Nichols will continue to work on weight loss, exercise, and decreasing simple carbohydrates in her diet to help decrease the risk of diabetes. We dicussed metformin including benefits and risks. She was informed that eating too many simple carbohydrates or too many calories at one sitting increases the likelihood of GI side effects. Stacey Nichols will continue on metformin and continue weight loss. Stacey Nichols agreed to follow-up with [place] as directed to monitor her progress.  Obesity Stacey Nichols is currently in the action stage of change. As such, her goal is to continue with weight loss efforts. She has agreed to follow the Category 2 plan. Stacey Nichols has been instructed to work up to a goal of 150 minutes of combined cardio and strengthening exercise per week for weight loss and overall health benefits. We discussed the following Behavioral Modification Strategies today: work on meal planning and easy cooking plans and keeping healthy foods in the home.  Stacey Nichols  has agreed to follow-up with our clinic in 3 weeks. She was informed of the importance of frequent follow-up visits to maximize her success with intensive lifestyle modifications for her multiple health conditions.  ALLERGIES: Allergies  Allergen Reactions  . Latex Other (See Comments) and Itching  . Sulfa Antibiotics Rash    [Rash] rash - paper tape OK  . Tape Rash    [Rash]  rash - paper tape OK    MEDICATIONS: Current Outpatient Medications on File Prior to Visit  Medication Sig Dispense Refill  . acetaminophen (TYLENOL) 500 MG tablet Take 500 mg by mouth every 6 (six) hours as needed.    Stacey Nichols Kitchen aspirin 81 MG tablet Take 81 mg by mouth daily.    . Calcium Citrate (CALCITRATE PO) Take 1 tablet by mouth daily.    Stacey Nichols Kitchen guaiFENesin-codeine (CHERATUSSIN AC) 100-10 MG/5ML syrup Take 5 mLs by mouth 2 (two) times daily as needed for cough (sedation precautions). 120 mL 0  . metFORMIN (GLUCOPHAGE) 500 MG tablet Take 1 tablet (500 mg total) by mouth 2 (two) times daily with a meal. 60 tablet 0  . Multiple Vitamins-Minerals (MULTI COMPLETE PO) Take 1 tablet by mouth daily.    . naproxen (NAPROSYN) 500 MG tablet Take 500 mg by mouth daily at 12 noon.    . rosuvastatin (CRESTOR) 10 MG tablet Take 1 tablet (10 mg total) by mouth at bedtime. 90 tablet 3  . vitamin B-12 (CYANOCOBALAMIN) 250 MCG tablet Take 250 mcg by mouth daily.     No current facility-administered medications on file prior to visit.     PAST MEDICAL HISTORY: Past Medical History:  Diagnosis Date  . 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 Anemia    hx of  . Anxiety   . Arthritis    severe  . Constipation   . Depression   . GERD (gastroesophageal reflux disease)   . H/O Bell's palsy   . H/O hiatal hernia   . Headache    occasional  . High triglycerides   . History of cancer chemotherapy   . Leukemia ([place])   . Osteopenia 06/08/2017   DEXA 2017 - T -1.8 spine  . Pneumonia    hx of   . Rheumatoid arthritis ([place])   . Sleep apnea   . Stroke ([place])    "mini stroke-caused bells palsy for 1 month"    PAST SURGICAL HISTORY: Past Surgical History:  Procedure Laterality Date  . ABDOMINAL HYSTERECTOMY  2001   heavy bleeding, precancerous cells, 1 ovary remains  . LAPAROSCOPIC GASTRIC BAND REMOVAL WITH LAPAROSCOPIC GASTRIC SLEEVE RESECTION  04/26/2014  . LAPAROSCOPIC  GASTRIC BANDING  2000   performed in [place]  . TUBAL LIGATION  1996  . UPPER GI ENDOSCOPY N/A 04/26/2014   Procedure: UPPER GI ENDOSCOPY;  Surgeon: Pedro Earls, MD;  Location: [facility];  Service: General;  Laterality: N/A;    SOCIAL HISTORY: Social History   Tobacco Use  . Smoking status: Former Smoker    Years: 30.00    Types: Cigarettes    Last attempt to quit: 03/30/2018    Years since quitting: 0.4  . Smokeless tobacco: Never Used  Substance Use Topics  . Alcohol use: Yes    Comment: occasional  . Drug use: No    FAMILY HISTORY: Family History  Problem Relation Age of Onset  . Cancer Mother        cervix  . Hyperlipidemia Father   .  Obesity Father   . CAD Father        MI and arrhythmia  . Cancer Sister        metastatic, ?pancrease primary  . Diabetes Maternal Grandfather    ROS: Review of Systems  Constitutional: Negative for weight loss.  Gastrointestinal: Negative for diarrhea, nausea and vomiting.  Musculoskeletal:       Negative for muscle weakness.  Endo/Heme/Allergies:       Negative for polyphagia. Negative for hypoglycemia.   PHYSICAL EXAM: Blood pressure 115/80, pulse 69, temperature 97.8 F (36.6 C), temperature source Oral, height 5\' 3"  (1.6 m), weight 227 lb (103 kg), SpO2 96 %. Body mass index is 40.21 kg/m. Physical Exam Vitals signs reviewed.  Constitutional:      Appearance: Normal appearance. She is obese.  Cardiovascular:     Rate and Rhythm: Normal rate.     Pulses: Normal pulses.  Pulmonary:     Effort: Pulmonary effort is normal.     Breath sounds: Normal breath sounds.  Musculoskeletal: Normal range of motion.  Skin:    General: Skin is warm and dry.  Neurological:     Mental Status: She is alert and oriented to person, place, and time.  Psychiatric:        Behavior: Behavior normal.   RECENT LABS AND TESTS: BMET    Component Value Date/Time   NA 142 06/30/2018 1534   NA 144 09/04/2017 1055   NA 142 01/14/2013  1757   K 4.2 06/30/2018 1534   K 4.6 01/14/2013 1757   CL 105 06/30/2018 1534   CL 107 01/14/2013 1757   CO2 29 06/30/2018 1534   CO2 29 01/14/2013 1757   GLUCOSE 92 06/30/2018 1534   GLUCOSE 95 01/14/2013 1757   BUN 20 06/30/2018 1534   BUN 18 09/04/2017 1055   BUN 11 01/14/2013 1757   CREATININE 0.79 06/30/2018 1534   CREATININE 0.76 12/25/2016 1201   CALCIUM 9.9 06/30/2018 1534   CALCIUM 9.2 01/14/2013 1757   GFRNONAA 100 09/04/2017 1055   GFRNONAA 89 12/25/2016 1201   GFRAA 115 09/04/2017 1055   GFRAA >89 12/25/2016 1201   Lab Results  Component Value Date   HGBA1C 5.8 (H) 09/02/2018   HGBA1C 5.9 12/26/2017   HGBA1C 5.9 (H) 09/04/2017   HGBA1C 5.9 (H) 05/05/2017   HGBA1C 6.0 (H) 01/21/2017   Lab Results  Component Value Date   INSULIN 30.9 (H) 09/02/2018   INSULIN 33.6 (H) 09/04/2017   INSULIN 26.0 (H) 05/05/2017   INSULIN 42.1 (H) 01/21/2017   CBC    Component Value Date/Time   WBC 6.5 06/30/2018 1534   RBC 4.53 06/30/2018 1534   HGB 14.6 06/30/2018 1534   HGB 14.5 09/04/2017 1055   HCT 44.1 06/30/2018 1534   HCT 42.8 09/04/2017 1055   PLT 254.0 06/30/2018 1534   PLT 214 06/20/2015 1141   MCV 97.3 06/30/2018 1534   MCV 98 (H) 09/04/2017 1055   MCV 96 01/15/2013 0522   MCH 33.0 09/04/2017 1055   MCH 32.6 08/27/2016 0904   MCHC 33.0 06/30/2018 1534   RDW 14.0 06/30/2018 1534   RDW 13.8 09/04/2017 1055   RDW 14.3 01/15/2013 0522   LYMPHSABS 2.6 06/30/2018 1534   LYMPHSABS 2.3 09/04/2017 1055   LYMPHSABS 3.6 01/15/2013 0522   MONOABS 0.6 06/30/2018 1534   MONOABS 0.8 01/15/2013 0522   EOSABS 0.3 06/30/2018 1534   EOSABS 0.2 09/04/2017 1055   EOSABS 0.3 01/15/2013 0522   BASOSABS  0.0 06/30/2018 1534   BASOSABS 0.0 09/04/2017 1055   BASOSABS 0.0 01/15/2013 0522   Iron/TIBC/Ferritin/ %Sat No results found for: IRON, TIBC, FERRITIN, IRONPCTSAT Lipid Panel     Component Value Date/Time   CHOL 153 09/02/2018 1353   CHOL 190 01/15/2013 0522   TRIG  159 (H) 09/02/2018 1353   TRIG 307 (H) 01/15/2013 0522   HDL 60 09/02/2018 1353   HDL 44 01/15/2013 0522   CHOLHDL 3 12/26/2017 0918   VLDL 40.8 (H) 12/26/2017 0918   VLDL 61 (H) 01/15/2013 0522   LDLCALC 61 09/02/2018 1353   LDLCALC 85 01/15/2013 0522   LDLDIRECT 72.0 12/26/2017 0918   Hepatic Function Panel     Component Value Date/Time   PROT 6.8 12/26/2017 0918   PROT 7.3 09/04/2017 1055   PROT 7.7 01/14/2013 1757   ALBUMIN 4.1 12/26/2017 0918   ALBUMIN 4.7 09/04/2017 1055   ALBUMIN 3.8 01/14/2013 1757   AST 16 12/26/2017 0918   AST 25 01/14/2013 1757   ALT 18 12/26/2017 0918   ALT 39 01/14/2013 1757   ALKPHOS 73 12/26/2017 0918   ALKPHOS 100 01/14/2013 1757   BILITOT 0.4 12/26/2017 0918   BILITOT 0.3 09/04/2017 1055   BILITOT 0.3 01/14/2013 1757      Component Value Date/Time   TSH 3.850 01/21/2017 1128   TSH 2.09 12/25/2016 1201   TSH 3.163 03/01/2014 1201    Ref. Range 09/02/2018 13:53  Vitamin D, 25-Hydroxy Latest Ref Range: 30.0 - 100.0 ng/mL 22.9 (L)   OBESITY BEHAVIORAL INTERVENTION VISIT  Today's visit was #28  Starting weight: 246 lbs Starting date: 01/21/2017 Today's weight: 227 lbs  Today's date: 09/16/2018 Total lbs lost to date: 19    09/16/2018  Height 5\' 3"  (1.6 m)  Weight 227 lb (103 kg)  BMI (Calculated) 40.22  BLOOD PRESSURE - SYSTOLIC 633  BLOOD PRESSURE - DIASTOLIC 80   Body Fat % 35.4 %  Total Body Water (lbs) 87.2 lbs   ASK: We discussed the diagnosis of obesity with Windell Moment today and Carmencita agreed to give [place] permission to discuss obesity behavioral modification therapy today.  ASSESS: Janace has the diagnosis of obesity and her BMI today is 40.22. Gary is in the action stage of change.   ADVISE: Noel was educated on the multiple health risks of obesity as well as the benefit of weight loss to improve her health. She was advised of the need for long term treatment and the importance of lifestyle modifications to improve her  current health and to decrease her risk of future health problems.  AGREE: Multiple dietary modification options and treatment options were discussed and  Lorree agreed to follow the recommendations documented in the above note.  ARRANGE: Ambriel was educated on the importance of frequent visits to treat obesity as outlined per CMS and USPSTF guidelines and agreed to schedule her next follow up appointment today.  Migdalia Dk, am acting as transcriptionist for Abby Potash, PA-C I, Abby Potash, PA-C have reviewed above note and agree with its content

## 2018-10-07 ENCOUNTER — Ambulatory Visit (INDEPENDENT_AMBULATORY_CARE_PROVIDER_SITE_OTHER): Payer: BLUE CROSS/BLUE SHIELD | Admitting: Physician Assistant

## 2018-10-08 ENCOUNTER — Encounter (INDEPENDENT_AMBULATORY_CARE_PROVIDER_SITE_OTHER): Payer: Self-pay

## 2018-10-12 ENCOUNTER — Other Ambulatory Visit (INDEPENDENT_AMBULATORY_CARE_PROVIDER_SITE_OTHER): Payer: Self-pay | Admitting: Physician Assistant

## 2018-10-12 DIAGNOSIS — E559 Vitamin D deficiency, unspecified: Secondary | ICD-10-CM

## 2018-10-31 ENCOUNTER — Other Ambulatory Visit (INDEPENDENT_AMBULATORY_CARE_PROVIDER_SITE_OTHER): Payer: Self-pay | Admitting: Physician Assistant

## 2018-10-31 DIAGNOSIS — R7303 Prediabetes: Secondary | ICD-10-CM

## 2019-04-12 ENCOUNTER — Ambulatory Visit: Payer: BLUE CROSS/BLUE SHIELD | Admitting: Podiatry

## 2019-08-16 ENCOUNTER — Ambulatory Visit (INDEPENDENT_AMBULATORY_CARE_PROVIDER_SITE_OTHER): Payer: 59 | Admitting: Family Medicine

## 2019-08-16 ENCOUNTER — Other Ambulatory Visit: Payer: Self-pay

## 2019-08-16 ENCOUNTER — Encounter: Payer: Self-pay | Admitting: Family Medicine

## 2019-08-16 VITALS — Wt 230.0 lb

## 2019-08-16 DIAGNOSIS — J069 Acute upper respiratory infection, unspecified: Secondary | ICD-10-CM | POA: Diagnosis not present

## 2019-08-16 NOTE — Assessment & Plan Note (Addendum)
Anticipate viral URI given current symptoms. Supportive care reviewed. In covid pandemic, reasonable to send for testing. I have signed patient up for testing tomorrow 8am at [facility]. Will be in touch with results. Pt agrees with plan.

## 2019-08-16 NOTE — Progress Notes (Signed)
[phone] 

## 2019-08-16 NOTE — Progress Notes (Signed)
Virtual visit completed through [employer]. Due to national recommendations of social distancing due to COVID-19, a virtual visit is felt to be most appropriate for this patient at this time. Reviewed limitations of a virtual visit.   Patient location: home Provider location: [place] at [facility], office If any vitals were documented, they were collected by patient at home unless specified below.    Wt 230 lb (104.3 kg)   BMI 40.74 kg/m    CC: sinus congestion Subjective:    Patient ID: Stacey Nichols, female    DOB: 1960-10-12, 59 y.o.   MRN: [id number]  HPI: Stacey Nichols is a 59 y.o. female presenting on 08/16/2019 for Sinusitis   Had second knee surgery 2020.   5d h/o URI symptoms, nasal congestion, sore throat, hoarseness. Sunday started feeling better. Still with some nasal congestion and scratchy throat. Mild chills. Occasional loss of smell (related to congestion), mild cough, mild fatigue.  Has been treating with dayquil, nyquil.  Also takes tylenol/advil.  Has been isolating.  + nephews sick recently.  No known covid exposure.   No fevers/chills, taste preserved, dyspnea, HA, abd pain, nausea, diarrhea or body aches.      Relevant past medical, surgical, family and social history reviewed and updated as indicated. Interim medical history since our last visit reviewed. Allergies and medications reviewed and updated. Outpatient Medications Prior to Visit  Medication Sig Dispense Refill  . acetaminophen (TYLENOL) 500 MG tablet Take 500 mg by mouth every 6 (six) hours as needed.    Marland Kitchen aspirin 81 MG tablet Take 81 mg by mouth daily.    . Calcium Citrate (CALCITRATE PO) Take 1 tablet by mouth daily.    . metFORMIN (GLUCOPHAGE) 500 MG tablet Take 1 tablet (500 mg total) by mouth 2 (two) times daily with a meal. 60 tablet 0  . Multiple Vitamins-Minerals (MULTI COMPLETE PO) Take 1 tablet by mouth daily.    . naproxen (NAPROSYN) 500 MG tablet Take 500 mg by mouth daily at 12  noon.    . rosuvastatin (CRESTOR) 10 MG tablet Take 1 tablet (10 mg total) by mouth at bedtime. 90 tablet 3  . guaiFENesin-codeine (CHERATUSSIN AC) 100-10 MG/5ML syrup Take 5 mLs by mouth 2 (two) times daily as needed for cough (sedation precautions). 120 mL 0  . vitamin B-12 (CYANOCOBALAMIN) 250 MCG tablet Take 250 mcg by mouth daily.    . Vitamin D, Ergocalciferol, (DRISDOL) 1.25 MG (50000 UT) CAPS capsule Take 1 capsule (50,000 Units total) by mouth every 7 (seven) days. (Patient not taking: Reported on 08/16/2019) 4 capsule 0   No facility-administered medications prior to visit.     Per HPI unless specifically indicated in ROS section below Review of Systems Objective:    Wt 230 lb (104.3 kg)   BMI 40.74 kg/m   Wt Readings from Last 3 Encounters:  08/16/19 230 lb (104.3 kg)  09/16/18 227 lb (103 kg)  09/02/18 224 lb (101.6 kg)     Physical exam: Gen: alert, NAD, not ill appearing Pulm: speaks in complete sentences without increased work of breathing Psych: normal mood, normal thought content       Assessment & Plan:   Problem List Items Addressed This Visit    Viral URI - Primary    Anticipate viral URI given current symptoms. Supportive care reviewed. In covid pandemic, reasonable to send for testing. I have signed patient up for testing tomorrow 8am at [facility]. Will be in touch with results. Pt  agrees with plan.       Relevant Orders   Novel Coronavirus, NAA ([facility])       No orders of the defined types were placed in this encounter.  Orders Placed This Encounter  Procedures  . Novel Coronavirus, NAA ([facility])    Order Specific Question:   Is this test for diagnosis or screening    Answer:   Diagnosis of ill patient    Order Specific Question:   Symptomatic for COVID-19 as defined by CDC    Answer:   Yes    Order Specific Question:   Date of Symptom Onset    Answer:   08/12/2019    Order Specific Question:   Hospitalized for COVID-19    Answer:   No    Order  Specific Question:   Admitted to ICU for COVID-19    Answer:   No    Order Specific Question:   Previously tested for COVID-19    Answer:   No    Order Specific Question:   Resident in a congregate (group) care setting    Answer:   No    Order Specific Question:   Is the patient student?    Answer:   No    Order Specific Question:   Employed in healthcare setting    Answer:   No    Order Specific Question:   Pregnant    Answer:   No    I discussed the assessment and treatment plan with the patient. The patient was provided an opportunity to ask questions and all were answered. The patient agreed with the plan and demonstrated an understanding of the instructions. The patient was advised to call back or seek an in-person evaluation if the symptoms worsen or if the condition fails to improve as anticipated.  Follow up plan: No follow-ups on file.  Ria Bush, MD

## 2019-08-17 ENCOUNTER — Ambulatory Visit: Payer: 59 | Attending: Internal Medicine

## 2019-08-17 ENCOUNTER — Other Ambulatory Visit: Payer: Self-pay

## 2019-08-17 DIAGNOSIS — Z20822 Contact with and (suspected) exposure to covid-19: Secondary | ICD-10-CM

## 2019-08-18 LAB — NOVEL CORONAVIRUS, NAA: SARS-CoV-2, NAA: NOT DETECTED

## 2020-03-06 ENCOUNTER — Telehealth: Payer: Self-pay

## 2020-03-06 NOTE — Telephone Encounter (Signed)
I spoke with pt; pt states she is feeling better and is not going to ED unless begins to have CP again. UC & ED precautions given and pt voiced understanding. FYI to Dr Darnell Level as PCP and Dr Damita Dunnings as being in office.

## 2020-03-06 NOTE — Telephone Encounter (Signed)
[facility] - Client TELEPHONE ADVICE RECORD [facility] Patient Name: Stacey Nichols Gender: Female DOB: [birth year] Age: 59 Y 23 M 25 D Return Phone Number: [phone] (Primary) Address: City/State/Zip: [place] [address] Client [facility] - Client Client Site [place] Physician Ria Bush - MD Contact Type Call Who Is Calling Patient / Member / Family / Caregiver Call Type Triage / Clinical Relationship To Patient Self Return Phone Number [phone] (Primary) Chief Complaint CHEST PAIN (>=21 years) - pain, pressure, heaviness or tightness Reason for Call Symptomatic / Request for [place] states she is having chest pain. Translation No Nurse Assessment Nurse: Sumner Boast, RN, En[place] Derry Date/Time Eilene Ghazi Time): 03/06/2020 2:12:23 PM Confirm and document reason for call. If symptomatic, describe symptoms. ---Caller states she is having chest pain which started one week ago. States she is also SOB. Chest pain is 6-7 out of 10 pain scale. Took Tylenol for the pain. Has the patient had close contact with a person known or suspected to have the novel coronavirus illness OR traveled / lives in area with major community spread (including international travel) in the last 14 days from the onset of symptoms? * If Asymptomatic, screen for exposure and travel within the last 14 days. ---No Does the patient have any new or worsening symptoms? ---Yes Will a triage be completed? ---Yes Related visit to physician within the last 2 weeks? ---No Does the PT have any chronic conditions? (i.e. diabetes, asthma, this includes High risk factors for pregnancy, etc.) ---No Is this a behavioral health or substance abuse call? ---No Guidelines Guideline Title Affirmed Question Affirmed Notes Nurse Date/Time Eilene Ghazi Time) Chest Pain [1] Chest pain lasts > 5 minutes AND [2]  age > [address] RN, En[place] Derry 03/06/2020 2:06:59 PM Disp. Time Eilene Ghazi Time) Disposition Final User 03/06/2020 2:01:46 PM Send to Urgent Rosalin Hawking 03/06/2020 2:10:45 PM 911 Outcome Documentation Sumner Boast, RN, En[place] Derry PLEASE NOTE: All timestamps contained within this report are represented as [place] Standard Time. CONFIDENTIALTY NOTICE: This fax transmission is intended only for the addressee. It contains information that is legally privileged, confidential or otherwise protected from use or disclosure. If you are not the intended recipient, you are strictly prohibited from reviewing, disclosing, copying using or disseminating any of this information or taking any action in reliance on or regarding this information. If you have received this fax in error, please notify us immediately by telephone so that we can arrange for its return to [place]. Phone: [phone], Toll-Free: [phone], Fax: [phone] Page: 2 of 2 Call Id: [id number] [place]. Time (Eastern Time) Disposition Final User Reason: Caller states daughter will take her to the hospital. 03/06/2020 2:09:08 PM Call EMS 911 Now Yes Sumner Boast, RN, Teola Bradley Disagree/Comply Comply Caller Understands Yes PreDisposition Call Doctor Care Advice Given Per Guideline CALL EMS 911 NOW: CARE ADVICE given per Chest Pain (Adult) guideline. Referrals GO TO FACILITY UNDECIDED GO TO FACILITY UNDECIDED

## 2020-03-07 ENCOUNTER — Other Ambulatory Visit: Payer: Self-pay

## 2020-03-07 ENCOUNTER — Encounter: Payer: Self-pay | Admitting: Urgent Care

## 2020-03-07 ENCOUNTER — Ambulatory Visit
Admission: EM | Admit: 2020-03-07 | Discharge: 2020-03-07 | Disposition: A | Discharge status: Home / Self Care | Payer: 59 | Attending: Urgent Care | Admitting: Urgent Care

## 2020-03-07 DIAGNOSIS — R0789 Other chest pain: Secondary | ICD-10-CM

## 2020-03-07 DIAGNOSIS — R0602 Shortness of breath: Secondary | ICD-10-CM

## 2020-03-07 NOTE — ED Provider Notes (Signed)
Renae Gloss   MRN: [id number] DOB: 12/21/1960  Subjective:   Stacey Nichols is a 59 y.o. female presenting for 1 week history of chest pressure, intermittent shortness of breath.  Patient states that is not really pain as much as it is mid chest pressure.  Denies fever, coughing, nausea, vomiting, body aches.  Episodes lasts a few minutes and resolve on their own.  Denies history of heart issues, heart attacks, heart failure.  Has had the symptoms performed previously but did not have any significant findings from her evaluation.  No current facility-administered medications for this encounter.  Current Outpatient Medications:    acetaminophen (TYLENOL) 500 MG tablet, Take 500 mg by mouth every 6 (six) hours as needed., Disp: , Rfl:    aspirin 81 MG tablet, Take 81 mg by mouth daily., Disp: , Rfl:    Calcium Citrate (CALCITRATE PO), Take 1 tablet by mouth daily., Disp: , Rfl:    metFORMIN (GLUCOPHAGE) 500 MG tablet, Take 1 tablet (500 mg total) by mouth 2 (two) times daily with a meal., Disp: 60 tablet, Rfl: 0   Multiple Vitamins-Minerals (MULTI COMPLETE PO), Take 1 tablet by mouth daily., Disp: , Rfl:    naproxen (NAPROSYN) 500 MG tablet, Take 500 mg by mouth daily at 12 noon., Disp: , Rfl:    rosuvastatin (CRESTOR) 10 MG tablet, Take 1 tablet (10 mg total) by mouth at bedtime., Disp: 90 tablet, Rfl: 3   vitamin B-12 (CYANOCOBALAMIN) 250 MCG tablet, Take 250 mcg by mouth daily., Disp: , Rfl:    Vitamin D, Ergocalciferol, (DRISDOL) 1.25 MG (50000 UT) CAPS capsule, Take 1 capsule (50,000 Units total) by mouth every 7 (seven) days. 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    s/p chemo (Dr Lissa Merlin)   Anemia    hx of   Anxiety    Arthritis    severe   Constipation    Depression    GERD (gastroesophageal reflux disease)    H/O Bell's palsy    H/O hiatal hernia    Headache    occasional   High triglycerides    History of cancer chemotherapy    Leukemia ([place])    Osteopenia 06/08/2017   DEXA 2017 - T -1.8 spine   Pneumonia    hx of    Rheumatoid arthritis ([place])    Sleep apnea    Stroke ([place])    "mini stroke-caused bells palsy for 1 month"     Past Surgical History:  Procedure Laterality Date   ABDOMINAL HYSTERECTOMY  2001   heavy bleeding, precancerous cells, 1 ovary remains   LAPAROSCOPIC GASTRIC BAND REMOVAL WITH LAPAROSCOPIC GASTRIC SLEEVE RESECTION  04/26/2014   LAPAROSCOPIC GASTRIC BANDING  2000   performed in [place]   TUBAL LIGATION  1996   UPPER GI ENDOSCOPY N/A 04/26/2014   Procedure: UPPER GI ENDOSCOPY;  Surgeon: Pedro Earls, MD;  Location: [facility];  Service: General;  Laterality: N/A;    Family History  Problem Relation Age of Onset   Cancer Mother        cervix   Hyperlipidemia Father    Obesity Father  CAD Father        MI and arrhythmia   Cancer Sister        metastatic, ?pancrease primary   Diabetes Maternal Grandfather     Social History   Tobacco Use   Smoking status: Former Smoker    Years: 30.00    Types: Cigarettes    Quit date: 03/30/2018    Years since quitting: 1.9   Smokeless tobacco: Never Used  Vaping Use   Vaping Use: Never used  Substance Use Topics   Alcohol use: Yes    Comment: occasional   Drug use: No    ROS   Objective:   Vitals: BP 110/73 (BP Location: Left Arm)    Pulse 86    Temp 98.3 F (36.8 C)    Resp 20    SpO2 94%   Wt Readings from Last 3 Encounters:  08/16/19 230 lb (104.3 kg)  09/16/18 227 lb (103 kg)  09/02/18 224 lb (101.6 kg)   Temp Readings from Last 3 Encounters:  03/07/20 98.3 F (36.8 C)  09/16/18 97.8 F (36.6 C) (Oral)   09/02/18 97.7 F (36.5 C)   BP Readings from Last 3 Encounters:  03/07/20 99/68  09/16/18 115/80  09/02/18 116/79   Pulse Readings from Last 3 Encounters:  03/07/20 89  09/16/18 69  09/02/18 79    Physical Exam Constitutional:      General: She is not in acute distress.    Appearance: Normal appearance. She is well-developed. She is obese. She is not ill-appearing, toxic-appearing or diaphoretic.  HENT:     Head: Normocephalic and atraumatic.     Nose: Nose normal.     Mouth/Throat:     Mouth: Mucous membranes are moist.  Eyes:     Extraocular Movements: Extraocular movements intact.     Pupils: Pupils are equal, round, and reactive to light.  Cardiovascular:     Rate and Rhythm: Normal rate and regular rhythm.     Pulses: Normal pulses.     Heart sounds: Normal heart sounds. No murmur heard.  No friction rub. No gallop.   Pulmonary:     Effort: Pulmonary effort is normal. No respiratory distress.     Breath sounds: Normal breath sounds. No stridor. No wheezing, rhonchi or rales.  Skin:    General: Skin is warm and dry.     Findings: No rash.  Neurological:     Mental Status: She is alert and oriented to person, place, and time.  Psychiatric:        Mood and Affect: Mood normal.        Behavior: Behavior normal.        Thought Content: Thought content normal.        Judgment: Judgment normal.     ED ECG REPORT   Date: 03/07/2020  EKG Time: 6:15 PM  Rate: 82 bpm  Rhythm: normal sinus rhythm,  normal EKG, normal sinus rhythm, unchanged from previous tracings  Axis: Normal  Intervals:none  ST&T Change: T wave flattening in lead III  Narrative Interpretation: Sinus rhythm at 82 bpm with nonspecific T wave flattening in lead III, unchanged from previous EKG.  Assessment and Plan :   PDMP not reviewed this encounter.  1. Chest pressure   2. Shortness of breath     COVID-19 test pending, recommended supportive care.  Chest x-ray is normal.  EKG reassuring.  Counseled patient on potential for adverse effects with medications prescribed/recommended today, ER and return-to-clinic precautions  discussed, patient verbalized understanding.    Jaynee Eagles, PA-C 03/08/20 1008

## 2020-03-07 NOTE — ED Triage Notes (Signed)
Pt states she started having episodes of chest pressure and SOB about one week ago.  Occurs 1-2 times per day.  Describes pressure as midchest discomfort. No pain to shoulder or jaw. Denies nausea, dizziness during these episodes.  Episodes last a few minutes.  No hx of heart problems.  Did have these s/s before and eval'ed with no significant findings.

## 2020-03-07 NOTE — Discharge Instructions (Signed)
Please to [facility] for chest x-ray tomorrow.  For now we will be looking out for the COVID-19 test results.  If your chest pressure becomes more chest pain and your shortness of breath worsens overnight or in the next 24 hours then please report to the emergency room.  I will try to follow-up with you soon as I can for your chest x-ray results and your Covid test results.  In the meantime, please make sure you are hydrating well, resting and using Tylenol for any aches and pains you may have more fevers that may come up.  Head [place] on [address] toward [address].  Then turned left at the first [address] on to [address].  Continue on to [address] [facility].  Turn left onto [address].  Turn right onto [address].  The address is [address] in [place], ZIP Code [phone].

## 2020-03-07 NOTE — Telephone Encounter (Signed)
Noted. Routed to PCP as FYI.

## 2020-03-08 ENCOUNTER — Ambulatory Visit
Admission: RE | Admit: 2020-03-08 | Discharge: 2020-03-08 | Disposition: A | Discharge status: Home / Self Care | Payer: 59 | Source: Ambulatory Visit | Attending: Urgent Care | Admitting: Urgent Care

## 2020-03-08 ENCOUNTER — Ambulatory Visit
Admission: RE | Admit: 2020-03-08 | Discharge: 2020-03-08 | Disposition: A | Discharge status: Home / Self Care | Payer: 59 | Attending: Urgent Care | Admitting: Urgent Care

## 2020-03-08 DIAGNOSIS — R0789 Other chest pain: Secondary | ICD-10-CM | POA: Insufficient documentation

## 2020-03-08 DIAGNOSIS — Z8701 Personal history of pneumonia (recurrent): Secondary | ICD-10-CM | POA: Insufficient documentation

## 2020-03-08 DIAGNOSIS — Z87891 Personal history of nicotine dependence: Secondary | ICD-10-CM | POA: Insufficient documentation

## 2020-03-08 DIAGNOSIS — R0602 Shortness of breath: Secondary | ICD-10-CM | POA: Diagnosis not present

## 2020-03-09 LAB — NOVEL CORONAVIRUS, NAA: SARS-CoV-2, NAA: NOT DETECTED

## 2020-03-09 LAB — SARS-COV-2, NAA 2 DAY TAT

## 2020-03-09 NOTE — Telephone Encounter (Signed)
She was having chest pain and dyspnea earlier in the week.  plz call for update - any fever or cough? Any further chest pain? If ongoing, rec OV - would triage to see if we can see in office.

## 2020-03-09 NOTE — Telephone Encounter (Signed)
Lvm asking pt to call back.  Need to relay Dr. G's message.  

## 2020-03-10 NOTE — Telephone Encounter (Signed)
Lvm asking pt to call back.  Need to relay Dr. G's message.  

## 2020-03-13 NOTE — Telephone Encounter (Signed)
Lvm asking pt to call back.  Need to relay Dr. G's message.  

## 2020-03-14 NOTE — Telephone Encounter (Signed)
Lvm asking pt to call back.  Not getting a response from pt after several calls.  Looks like pt was seen at [facility] on 03/07/20.  FYI to Dr. Darnell Level.

## 2020-03-23 ENCOUNTER — Telehealth: Payer: Self-pay

## 2020-03-23 NOTE — Telephone Encounter (Signed)
See in this note where pt has already spoken with Larene Beach RN and pt is scheduled appt with Dr Darnell Level on 03/24/20 at 7:30. Ed precautions already given to pt.

## 2020-03-23 NOTE — Telephone Encounter (Signed)
Call transferred to this nurse after being transferred to access nurse and disposition was ER for chest pressure. Pt refused to go to ER.  Pt c/o chest pressue, "like pushing", increasing in frequency to 3-4 times daily with exertion for over 5 wks. Pt has been assessed at urgent care recently for this symptom and told she was ok. Note is in chart. Pt denies any SOB, jaw pain, nausea, or radiating pain. Pt denies any pain currently and only with exertion such as walking.   Pt reported she has apt with PCP in November but would like a sooner apt to discuss this symptom. Advised apt available for tomorrow. Pt agreed. Advised of ER precautions. Pt verbalized understanding. Pt scheduled for apt for tomorrow at 730.

## 2020-03-23 NOTE — Telephone Encounter (Signed)
[facility] - Client TELEPHONE ADVICE RECORD Access[facility]urse Patient Name: Stacey Nichols Gender: Female DOB: 03-13-1961 Age: 59 Y 2 M 11 D Return Phone Number: [phone] (Primary) Address: City/State/Zip: [place] [address] Client [facility] - Client Client Site [place] Physician Ria Bush - MD Contact Type Call Who Is Calling Patient / Member / Family / Caregiver Call Type Triage / Clinical Relationship To Patient Self Return Phone Number [phone] (Primary) Chief Complaint CHEST PAIN (>=21 years) - pain, pressure, heaviness or tightness Reason for Call Symptomatic / Request for Stacey Nichols states she has chest pressure, very tired, no other symptoms, and wondering what to do. Translation No Nurse Assessment Nurse: Rock Nephew, RN, Juliann Pulse Date/Time (Eastern Time): 03/23/2020 2:55:01 PM Confirm and document reason for call. If symptomatic, describe symptoms. ---Caller states she has chest pressure, very tired, no other symptoms, and wondering what to do. * She was seen in ED for same 2 weeks ago and had an EKG and CXR done, all normal . Has the patient had close contact with a person known or suspected to have the novel coronavirus illness OR traveled / lives in area with major community spread (including international travel) in the last 14 days from the onset of symptoms? * If Asymptomatic, screen for exposure and travel within the last 14 days. ---No Does the patient have any new or worsening symptoms? ---Yes Will a triage be completed? ---Yes Related visit to physician within the last 2 weeks? ---Yes Does the PT have any chronic conditions? (i.e. diabetes, asthma, this includes High risk factors for pregnancy, etc.) ---No Is this a behavioral health or substance abuse call? ---No Guidelines Guideline Title Affirmed Question Affirmed Notes  Nurse Date/Time Eilene Ghazi Time) Chest Pain Taking a deep breath makes pain worse Rock Nephew, RN, Juliann Pulse 03/23/2020 2:55:31 PM Disp. Time Eilene Ghazi Time) Disposition Final User 03/23/2020 2:51:04 PM Send to Urgent Queue Rosana Fret 03/23/2020 3:01:28 PM Go to ED Now (or PCP triage) Yes Rock Nephew, RN, Aldean Baker NOTE: All timestamps contained within this report are represented as [place] Standard Time. CONFIDENTIALTY NOTICE: This fax transmission is intended only for the addressee. It contains information that is legally privileged, confidential or otherwise protected from use or disclosure. If you are not the intended recipient, you are strictly prohibited from reviewing, disclosing, copying using or disseminating any of this information or taking any action in reliance on or regarding this information. If you have received this fax in error, please notify us immediately by telephone so that we can arrange for its return to [place]. Phone: [phone], Toll-Free: [phone], Fax: [phone] Page: 2 of 2 Call Id: [id number] [place] Disagree/Comply Disagree Caller Understands Yes PreDisposition Call Doctor Care Advice Given Per Guideline GO TO ED NOW (OR PCP TRIAGE): * IF NO PCP (PRIMARY CARE PROVIDER) SECOND-LEVEL TRIAGE: You need to be seen within the next hour. Go to the [facility] at _____________ [place] as soon as you can. CARE ADVICE given per Chest Pain (Adult) guideline. Comments User: Valetta Mole, RN Date/Time (Eastern Time): 03/23/2020 3:06:01 PM Caller transferred to Shirlean Mylar ( back line ) after patient stated she wanted to be seen in the office for medication instead of going to ER. Referrals GO TO FACILITY REFUSED

## 2020-03-24 ENCOUNTER — Other Ambulatory Visit: Payer: Self-pay

## 2020-03-24 ENCOUNTER — Ambulatory Visit: Payer: 59 | Admitting: Family Medicine

## 2020-03-24 ENCOUNTER — Encounter: Payer: Self-pay | Admitting: Family Medicine

## 2020-03-24 VITALS — BP 118/78 | HR 67 | Temp 97.7°F | Ht 63.0 in | Wt 243.6 lb

## 2020-03-24 DIAGNOSIS — E785 Hyperlipidemia, unspecified: Secondary | ICD-10-CM | POA: Diagnosis not present

## 2020-03-24 DIAGNOSIS — R7303 Prediabetes: Secondary | ICD-10-CM | POA: Diagnosis not present

## 2020-03-24 DIAGNOSIS — R0789 Other chest pain: Secondary | ICD-10-CM | POA: Insufficient documentation

## 2020-03-24 DIAGNOSIS — Z6841 Body Mass Index (BMI) 40.0 and over, adult: Secondary | ICD-10-CM

## 2020-03-24 DIAGNOSIS — Z23 Encounter for immunization: Secondary | ICD-10-CM

## 2020-03-24 DIAGNOSIS — Z9884 Bariatric surgery status: Secondary | ICD-10-CM

## 2020-03-24 DIAGNOSIS — C9201 Acute myeloblastic leukemia, in remission: Secondary | ICD-10-CM

## 2020-03-24 DIAGNOSIS — E559 Vitamin D deficiency, unspecified: Secondary | ICD-10-CM | POA: Diagnosis not present

## 2020-03-24 LAB — COMPREHENSIVE METABOLIC PANEL
ALT: 41 U/L — ABNORMAL HIGH (ref 0–35)
AST: 26 U/L (ref 0–37)
Albumin: 4.2 g/dL (ref 3.5–5.2)
Alkaline Phosphatase: 80 U/L (ref 39–117)
BUN: 21 mg/dL (ref 6–23)
CO2: 32 mEq/L (ref 19–32)
Calcium: 9.5 mg/dL (ref 8.4–10.5)
Chloride: 103 mEq/L (ref 96–112)
Creatinine, Ser: 0.75 mg/dL (ref 0.40–1.20)
GFR: 79.31 mL/min (ref >60.00)
Glucose, Bld: 105 mg/dL — ABNORMAL HIGH (ref 70–99)
Potassium: 4.4 mEq/L (ref 3.5–5.1)
Sodium: 141 mEq/L (ref 135–145)
Total Bilirubin: 0.7 mg/dL (ref 0.2–1.2)
Total Protein: 6.6 g/dL (ref 6.0–8.3)

## 2020-03-24 LAB — LIPID PANEL
Cholesterol: 190 mg/dL (ref 0–200)
HDL: 50.4 mg/dL (ref >39.00)
LDL Cholesterol: 104 mg/dL — ABNORMAL HIGH (ref 0–99)
NonHDL: 139.55
Total CHOL/HDL Ratio: 4
Triglycerides: 176 mg/dL — ABNORMAL HIGH (ref 0.0–149.0)
VLDL: 35.2 mg/dL (ref 0.0–40.0)

## 2020-03-24 LAB — CBC WITH DIFFERENTIAL/PLATELET
Basophils Absolute: 0 10*3/uL (ref 0.0–0.1)
Basophils Relative: 0.4 % (ref 0.0–3.0)
Eosinophils Absolute: 0.2 10*3/uL (ref 0.0–0.7)
Eosinophils Relative: 3.1 % (ref 0.0–5.0)
HCT: 45.4 % (ref 36.0–46.0)
Hemoglobin: 15.2 g/dL — ABNORMAL HIGH (ref 12.0–15.0)
Lymphocytes Relative: 44.3 % (ref 12.0–46.0)
Lymphs Abs: 2.9 10*3/uL (ref 0.7–4.0)
MCHC: 33.4 g/dL (ref 30.0–36.0)
MCV: 99.2 fl (ref 78.0–100.0)
Monocytes Absolute: 0.8 10*3/uL (ref 0.1–1.0)
Monocytes Relative: 11.5 % (ref 3.0–12.0)
Neutro Abs: 2.7 10*3/uL (ref 1.4–7.7)
Neutrophils Relative %: 40.7 % — ABNORMAL LOW (ref 43.0–77.0)
Platelets: 184 10*3/uL (ref 150.0–400.0)
RBC: 4.58 Mil/uL (ref 3.87–5.11)
RDW: 14.5 % (ref 11.5–15.5)
WBC: 6.7 10*3/uL (ref 4.0–10.5)

## 2020-03-24 LAB — TSH: TSH: 4.71 u[IU]/mL — ABNORMAL HIGH (ref 0.35–4.50)

## 2020-03-24 LAB — VITAMIN D 25 HYDROXY (VIT D DEFICIENCY, FRACTURES): VITD: 14.5 ng/mL — ABNORMAL LOW (ref 30.00–100.00)

## 2020-03-24 LAB — HEMOGLOBIN A1C: Hgb A1c MFr Bld: 6.3 % (ref 4.6–6.5)

## 2020-03-24 MED ORDER — NITROGLYCERIN 0.4 MG SL SUBL: 0.4000 mg | SUBLINGUAL_TABLET | SUBLINGUAL | 1 refills | Status: DC | PRN

## 2020-03-24 MED ORDER — ROSUVASTATIN CALCIUM 10 MG PO TABS: 10.0000 mg | ORAL_TABLET | Freq: Every day | ORAL | 3 refills | Status: DC

## 2020-03-24 NOTE — Patient Instructions (Addendum)
Flu shot today EKG today Labs today.  La remitiremos a cardiologo para evaluacion.  Recomienze crestor.  Le he mandado nitroglicerina a la farmacia por si dolor de pecho sigue.

## 2020-03-24 NOTE — Assessment & Plan Note (Signed)
New over 5 wks. H/o calcium score of zero 07/2015 however concerning symptoms in this patient with comorbidities of obesity and prediabetes. Update labs and EKG, restart crestor, Rx SL nitro with indications when to take, will refer to cardiology for further risk stratification, reviewed ER precautions. Pt agrees with plan.

## 2020-03-24 NOTE — Assessment & Plan Note (Signed)
Update levels off replacement. 

## 2020-03-24 NOTE — Assessment & Plan Note (Addendum)
Update A1c - has been off metformin - previously prescribed by [facility].

## 2020-03-24 NOTE — Assessment & Plan Note (Signed)
Check cbc 

## 2020-03-24 NOTE — Telephone Encounter (Signed)
Will see today.  

## 2020-03-24 NOTE — Progress Notes (Signed)
This visit was conducted in person.  BP 118/78 (BP Location: Left Arm, Patient Position: Sitting, Cuff Size: Large)   Pulse 67   Temp 97.7 F (36.5 C) (Temporal)   Ht 5\' 3"  (1.6 m)   Wt 243 lb 9 oz (110.5 kg)   SpO2 96%   BMI 43.15 kg/m    CC: chest pressure Subjective:    Patient ID: Stacey Nichols, female    DOB: 1961/01/14, 60 y.o.   MRN: [id number]  HPI: Stacey Nichols is a 59 y.o. female presenting on 03/24/2020 for Chest Pain (C/o chest pressure and a little SOB.  Sxs started about 5 wks ago. )   5 wk h/o exertional chest pressure. S/p evaluation at [facility] - with reassuring EKG (nonspecific T wave flattening III), labs and CXR. Had negative covid test on 8/24 and again on 8/25. Symptoms worsening. Episodes last 15-20 seconds, now happening 4-5 times a day.   Symptoms started after high stress period with legal troubles - this has largely improved however episodes persist. More noticeable with exercise but also can be present at rest. Rest improves symptoms. Notes worse with deep breathing.   No nausea, jaw or arm pain, abd pain, cough.  Notes some upper back discomfort.  Notes low energy.   Known prediabetic, previously well controlled, was on metformin and crestor. Now only on aspirin 81mg  daily.  Lab Results  Component Value Date   HGBA1C 5.8 (H) 09/02/2018   Lab Results  Component Value Date   CHOL 153 09/02/2018   HDL 60 09/02/2018   LDLCALC 61 09/02/2018   LDLDIRECT 72.0 12/26/2017   TRIG 159 (H) 09/02/2018   CHOLHDL 3 12/26/2017        Relevant past medical, surgical, family and social history reviewed and updated as indicated. Interim medical history since our last visit reviewed. Allergies and medications reviewed and updated. Outpatient Medications Prior to Visit  Medication Sig Dispense Refill  . acetaminophen (TYLENOL) 500 MG tablet Take 500 mg by mouth every 6 (six) hours as needed.    Marland Kitchen aspirin 81 MG tablet Take 81 mg by mouth daily.    Marland Kitchen  ibuprofen (ADVIL) 200 MG tablet Take 200 mg by mouth every 6 (six) hours as needed.    . Calcium Citrate (CALCITRATE PO) Take 1 tablet by mouth daily.    . metFORMIN (GLUCOPHAGE) 500 MG tablet Take 1 tablet (500 mg total) by mouth 2 (two) times daily with a meal. (Patient not taking: Reported on 03/24/2020) 60 tablet 0  . Multiple Vitamins-Minerals (MULTI COMPLETE PO) Take 1 tablet by mouth daily.    . naproxen (NAPROSYN) 500 MG tablet Take 500 mg by mouth daily at 12 noon.    . rosuvastatin (CRESTOR) 10 MG tablet Take 1 tablet (10 mg total) by mouth at bedtime. (Patient not taking: Reported on 03/24/2020) 90 tablet 3  . vitamin B-12 (CYANOCOBALAMIN) 250 MCG tablet Take 250 mcg by mouth daily.    . Vitamin D, Ergocalciferol, (DRISDOL) 1.25 MG (50000 UT) CAPS capsule Take 1 capsule (50,000 Units total) by mouth every 7 (seven) days. (Patient not taking: Reported on 08/16/2019) 4 capsule 0   No facility-administered medications prior to visit.     Per HPI unless specifically indicated in ROS section below Review of Systems Objective:  BP 118/78 (BP Location: Left Arm, Patient Position: Sitting, Cuff Size: Large)   Pulse 67   Temp 97.7 F (36.5 C) (Temporal)   Ht 5\' 3"  (1.6 m)  Wt 243 lb 9 oz (110.5 kg)   SpO2 96%   BMI 43.15 kg/m   Wt Readings from Last 3 Encounters:  03/24/20 243 lb 9 oz (110.5 kg)  08/16/19 230 lb (104.3 kg)  09/16/18 227 lb (103 kg)      Physical Exam Vitals and nursing note reviewed.  Constitutional:      Appearance: Normal appearance. She is obese. She is not ill-appearing.  Eyes:     Extraocular Movements: Extraocular movements intact.     Conjunctiva/sclera: Conjunctivae normal.     Pupils: Pupils are equal, round, and reactive to light.  Neck:     Thyroid: No thyroid mass, thyromegaly or thyroid tenderness.  Cardiovascular:     Rate and Rhythm: Normal rate and regular rhythm.     Pulses: Normal pulses.     Heart sounds: Normal heart sounds. No murmur  heard.   Pulmonary:     Effort: Pulmonary effort is normal. No respiratory distress.     Breath sounds: Normal breath sounds. No wheezing, rhonchi or rales.  Musculoskeletal:     Right lower leg: No edema.     Left lower leg: No edema.  Neurological:     Mental Status: She is alert.  Psychiatric:        Mood and Affect: Mood normal.        Behavior: Behavior normal.       EKG - NSR rate 60s, normal axis, intervals, no acute ST/T changes.  Assessment & Plan:  This visit occurred during the SARS-CoV-2 public health emergency.  Safety protocols were in place, including screening questions prior to the visit, additional usage of staff PPE, and extensive cleaning of exam room while observing appropriate contact time as indicated for disinfecting solutions.   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      Relevant Orders   Lipid panel   Hemoglobin A1c   Morbid obesity with BMI of 40.0-44.9, adult ([place])   Dyslipidemia    Off crestor. Restart. H/o calcium score of zero 07/2015.  The ASCVD Risk score Mikey Bussing DC Jr., et al., 2013) failed to calculate for the following reasons:   The patient has a prior MI or stroke diagnosis       Relevant Medications   rosuvastatin (CRESTOR) 10 MG tablet   Other Relevant Orders   Lipid panel   Comprehensive metabolic panel   Ambulatory referral to Cardiology   Chest pressure - Primary    New over 5 wks. H/o calcium score of zero 07/2015 however concerning symptoms in this patient with comorbidities of obesity and prediabetes. Update labs and EKG, restart crestor, Rx SL nitro with indications when to take, will refer to cardiology for further risk  stratification, reviewed ER precautions. Pt agrees with plan.       Relevant Orders   Lipid panel   Comprehensive metabolic panel   TSH   CBC with Differential/Platelet   Ambulatory referral to Cardiology   EKG 12-Lead (Completed)   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)    Check cbc.       Relevant Medications   ibuprofen (ADVIL) 200 MG  tablet    Other Visit Diagnoses    Need for influenza vaccination       Relevant Orders   Flu Vaccine QUAD 36+ mos IM (Completed)       Meds ordered this encounter  Medications  . rosuvastatin (CRESTOR) 10 MG tablet    Sig: Take 1 tablet (10 mg total) by mouth at bedtime.    Dispense:  90 tablet    Refill:  3  . nitroGLYCERIN (NITROSTAT) 0.4 MG SL tablet    Sig: Place 1 tablet (0.4 mg total) under the tongue every 5 (five) minutes as needed for chest pain.    Dispense:  30 tablet    Refill:  1   Orders Placed This Encounter  Procedures  . Flu Vaccine QUAD 36+ mos IM  . Lipid panel  . Comprehensive metabolic panel  . TSH  . CBC with Differential/Platelet  . Hemoglobin A1c  . VITAMIN D 25 Hydroxy (Vit-D Deficiency, Fractures)  . Ambulatory referral to Cardiology    Referral Priority:   Urgent    Referral Type:   Consultation    Referral Reason:   Specialty Services Required    Requested Specialty:   Cardiology    Number of Visits Requested:   1  . EKG 12-Lead    Follow up plan: No follow-ups on file.  Ria Bush, MD

## 2020-03-24 NOTE — Assessment & Plan Note (Signed)
Off crestor. Restart. H/o calcium score of zero 07/2015.  The ASCVD Risk score Mikey Bussing DC Jr., et al., 2013) failed to calculate for the following reasons:   The patient has a prior MI or stroke diagnosis

## 2020-03-25 ENCOUNTER — Other Ambulatory Visit: Payer: Self-pay | Admitting: Family Medicine

## 2020-03-25 MED ORDER — VITAMIN D3 1.25 MG (50000 UT) PO TABS: 1.0000 | ORAL_TABLET | ORAL | 1 refills | Status: DC

## 2020-03-27 ENCOUNTER — Encounter: Payer: Self-pay | Admitting: Cardiology

## 2020-03-27 ENCOUNTER — Ambulatory Visit (INDEPENDENT_AMBULATORY_CARE_PROVIDER_SITE_OTHER): Payer: 59 | Admitting: Cardiology

## 2020-03-27 ENCOUNTER — Other Ambulatory Visit: Payer: Self-pay

## 2020-03-27 VITALS — BP 114/82 | HR 68 | Ht 62.0 in | Wt 246.2 lb

## 2020-03-27 DIAGNOSIS — R0600 Dyspnea, unspecified: Secondary | ICD-10-CM

## 2020-03-27 DIAGNOSIS — E7800 Pure hypercholesterolemia, unspecified: Secondary | ICD-10-CM | POA: Diagnosis not present

## 2020-03-27 DIAGNOSIS — R079 Chest pain, unspecified: Secondary | ICD-10-CM | POA: Diagnosis not present

## 2020-03-27 DIAGNOSIS — R0609 Other forms of dyspnea: Secondary | ICD-10-CM

## 2020-03-27 NOTE — Progress Notes (Signed)
Cardiology Office Note:    Date:  03/27/2020   ID:  Stacey Nichols, DOB May 25, 1961, MRN [id number]  PCP:  Ria Bush, MD  [facility] Cardiologist:  Kate Sable, MD  [place] Electrophysiologist:  None   Referring MD: Ria Bush, MD   Chief Complaint  Patient presents with  . other    Chest pressure and sob. Meds reviewed verbally with pt.   Stacey Nichols is a 59 y.o. female who is being seen today for the evaluation of chest pain at the request of Ria Bush, MD.   History of Present Illness:    Spanish interpreter used for today's visit.  Stacey Nichols is a 59 y.o. 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  Patient states having symptoms of chest pressure over the past 5 weeks.  Symptoms are usually associated with exertion.  She rates chest pressure as 5 out of 10 in severity.  Symptoms resolve when she rests.  Also endorses shortness of breath with symptoms.  She denies any history of heart disease, denies palpitations presyncope or syncope.  She states gaining about 20 pounds over the past year due to the current pandemic.  Past Medical History:  Diagnosis Date  . AML (acute myeloblastic leukemia) ([place]) 2007   s/p chemo (Dr Lissa Merlin)  . Anemia    hx of  . Anxiety   . Arthritis    severe  . Constipation   . Depression   . GERD (gastroesophageal reflux disease)   . H/O Bell's palsy   . H/O hiatal hernia   . Headache    occasional  . High triglycerides   . History of cancer chemotherapy   . Leukemia ([place])   . Osteopenia 06/08/2017   DEXA 2017 - T -1.8 spine  . Pneumonia    hx of   . Rheumatoid arthritis ([place])   . Sleep apnea   . Stroke [facility])    "mini stroke-caused bells palsy for 1 month"    Past Surgical History:  Procedure Laterality Date  . ABDOMINAL HYSTERECTOMY  2001   heavy bleeding, precancerous cells, 1 ovary remains  .  LAPAROSCOPIC GASTRIC BAND REMOVAL WITH LAPAROSCOPIC GASTRIC SLEEVE RESECTION  04/26/2014  . LAPAROSCOPIC GASTRIC BANDING  2000   performed in [place]  . TUBAL LIGATION  1996  . UPPER GI ENDOSCOPY N/A 04/26/2014   Procedure: UPPER GI ENDOSCOPY;  Surgeon: Pedro Earls, MD;  Location: [facility];  Service: General;  Laterality: N/A;    Current Medications: Current Meds  Medication Sig  . acetaminophen (TYLENOL) 500 MG tablet Take 500 mg by mouth every 6 (six) hours as needed.  Marland Kitchen aspirin 81 MG tablet Take 81 mg by mouth daily.  . rosuvastatin (CRESTOR) 10 MG tablet Take 1 tablet (10 mg total) by mouth at bedtime.     Allergies:   Latex, Sulfa antibiotics, and Tape   Social History   Socioeconomic History  . Marital status: Single    Spouse name: Not on file  . Number of children: Not on file  . Years of education: Not on file  . Highest education level: Not on file  Occupational History  . Occupation: stay at home  Tobacco Use  . Smoking status: Former Smoker    Years: 30.00    Types: Cigarettes    Quit date: 03/30/2018    Years since quitting: 1.9  . Smokeless  tobacco: Never Used  Vaping Use  . Vaping Use: Never used  Substance and Sexual Activity  . Alcohol use: Yes    Comment: occasional  . Drug use: No  . Sexual activity: Not on file  Other Topics Concern  . Not on file  Social History Narrative   From [place]   Lives alone    Separated from husband after leukemia    Occ: retired, was Armed forces operational officer    Edu: 2 yrs college   Activity: no regular exercise    Diet: some water, fruits/vegetables daily    Social Determinants of Health   Financial Resource Strain:   . Difficulty of Paying Living Expenses: Not on file  Food Insecurity:   . Worried About Charity fundraiser in the Last Year: Not on file  . Ran Out of Food in the Last Year: Not on file  Transportation Needs:   . Lack of Transportation (Medical): Not on file  . Lack of Transportation (Non-Medical): Not  on file  Physical Activity:   . Days of Exercise per Week: Not on file  . Minutes of Exercise per Session: Not on file  Stress:   . Feeling of Stress : Not on file  Social Connections:   . Frequency of Communication with Friends and Family: Not on file  . Frequency of Social Gatherings with Friends and Family: Not on file  . Attends Religious Services: Not on file  . Active Member of Clubs or Organizations: Not on file  . Attends Archivist Meetings: Not on file  . Marital Status: Not on file     Family History: The patient's family history includes CAD in her father; Cancer in her mother and sister; Diabetes in her maternal grandfather; Heart Problems in her father; Hyperlipidemia in her father; Obesity in her father.  ROS:   Please see the history of present illness.     All other systems reviewed and are negative.  EKGs/Labs/Other Studies Reviewed:    The following studies were reviewed today:   EKG:  EKG is  ordered today.  The ekg ordered today demonstrates normal sinus rhythm  Recent Labs: 03/24/2020: ALT 41; BUN 21; Creatinine, Ser 0.75; Hemoglobin 15.2; Platelets 184.0; Potassium 4.4; Sodium 141; TSH 4.71  Recent Lipid Panel    Component Value Date/Time   CHOL 190 03/24/2020 0813   CHOL 153 09/02/2018 1353   CHOL 190 01/15/2013 0522   TRIG 176.0 (H) 03/24/2020 0813   TRIG 307 (H) 01/15/2013 0522   HDL 50.40 03/24/2020 0813   HDL 60 09/02/2018 1353   HDL 44 01/15/2013 0522   CHOLHDL 4 03/24/2020 0813   VLDL 35.2 03/24/2020 0813   VLDL 61 (H) 01/15/2013 0522   LDLCALC 104 (H) 03/24/2020 0813   LDLCALC 61 09/02/2018 1353   LDLCALC 85 01/15/2013 0522   LDLDIRECT 72.0 12/26/2017 0918    Physical Exam:    VS:  BP 114/82 (BP Location: Right Arm, Patient Position: Sitting, Cuff Size: Normal)   Pulse 68   Ht 5\' 2"  (1.575 m)   Wt 246 lb 4 oz (111.7 kg)   SpO2 98%   BMI 45.04 kg/m     Wt Readings from Last 3 Encounters:  03/27/20 246 lb 4 oz (111.7  kg)  03/24/20 243 lb 9 oz (110.5 kg)  08/16/19 230 lb (104.3 kg)     GEN:  Well nourished, well developed in no acute distress, obese HEENT: Normal NECK: No JVD; No carotid bruits  LYMPHATICS: No lymphadenopathy CARDIAC: RRR, no murmurs, rubs, gallops RESPIRATORY:  Clear to auscultation without rales, wheezing or rhonchi  ABDOMEN: Soft, non-tender, distended MUSCULOSKELETAL:  No edema; No deformity  SKIN: Warm and dry NEUROLOGIC:  Alert and oriented x 3 PSYCHIATRIC:  Normal affect   ASSESSMENT:    1. Chest pain of uncertain etiology   2. Dyspnea on exertion   3. Pure hypercholesterolemia   4. Morbid obesity ([place])    PLAN:    In order of problems listed above:  1. Patient with history of chest pressure consistent with angina.  Has risk factors of hyperlipidemia, obesity.  Get echocardiogram to evaluate any cardiac dysfunction, get Lexiscan Myoview to evaluate for ischemia. 2. Dyspnea on exertion could be secondary to an anginal equivalent.  Get echo as above.  Obesity and lung pathology due to prior smoking could be contributing.  If cardiac testing normal, will consider other etiologies. 3. History of hyperlipidemia, continue statin. 4. Patient is morbidly obese.  Low calorie diet, weight loss advised.  Follow-up after echocardiogram and Myoview.  Total encounter time 80 minutes  Greater than 50% was spent in counseling and coordination of care with the patient Time included but not limited to interpreter services, explaining reasoning for stress testing, indications and side effects.  Medication Adjustments/Labs and Tests Ordered: Current medicines are reviewed at length with the patient today.  Concerns regarding medicines are outlined above.  Orders Placed This Encounter  Procedures  . NM Myocar Multi W/Spect W/Wall Motion / EF  . EKG 12-Lead  . ECHOCARDIOGRAM COMPLETE   No orders of the defined types were placed in this encounter.   Patient Instructions    Medication Instructions:  Your physician recommends that you continue on your current medications as directed. Please refer to the Current Medication list given to you today.  *If you need a refill on your cardiac medications before your next appointment, please call your pharmacy*   Lab Work: None Ordered If you have labs (blood work) drawn today and your tests are completely normal, you will receive your results only by: Marland Kitchen MyChart Message (if you have MyChart) OR . A paper copy in the mail If you have any lab test that is abnormal or we need to change your treatment, we will call you to review the results.   Testing/Procedures:  1.  Your physician has requested that you have an echocardiogram. Echocardiography is a painless test that uses sound waves to create images of your heart. It provides your doctor with information about the size and shape of your heart and how well your heart's chambers and valves are working. This procedure takes approximately one hour. There are no restrictions for this procedure.  2.  Your physician has requested that you have a lexiscan myoview.     [place]  Your caregiver has ordered a Stress Test with nuclear imaging. The purpose of this test is to evaluate the blood supply to your heart muscle. This procedure is referred to as a "Non-Invasive Stress Test." This is because other than having an IV started in your vein, nothing is inserted or "invades" your body. Cardiac stress tests are done to find areas of poor blood flow to the heart by determining the extent of coronary artery disease (CAD). Some patients exercise on a treadmill, which naturally increases the blood flow to your heart, while others who are  unable to walk on a treadmill due to physical limitations have a pharmacologic/chemical stress agent called Lexiscan .  This medicine will mimic walking on a treadmill by temporarily increasing your coronary blood flow.     PLEASE REPORT TO [facility]  MEDICAL MALL ENTRANCE  THE VOLUNTEERS AT THE FIRST DESK WILL DIRECT YOU WHERE TO GO  **Please note: these test may take anywhere between 2-4 hours to complete   Date of Procedure:_____________________________________  Arrival Time for Procedure:______________________________      PLEASE NOTIFY THE OFFICE AT LEAST 24 HOURS IN ADVANCE IF YOU ARE UNABLE TO KEEP YOUR APPOINTMENT.  [phone] AND  PLEASE NOTIFY NUCLEAR MEDICINE AT [facility] AT LEAST 24 HOURS IN ADVANCE IF YOU ARE UNABLE TO KEEP YOUR APPOINTMENT. [phone]   How to prepare for your Myoview test:  1. Do not eat or drink after midnight 2. No caffeine for 24 hours prior to test 3. No smoking 24 hours prior to test. 4. Your medication may be taken with water.  If your doctor stopped a medication because of this test, do not take that medication. 5. Ladies, please do not wear dresses.  Skirts or pants are appropriate. Please wear a short sleeve shirt. 6. No perfume, cologne or lotion. 7. Wear comfortable walking shoes. No heels!      Follow-Up: At [facility], you and your health needs are our priority.  As part of our continuing mission to provide you with exceptional heart care, we have created designated Provider Care Teams.  These Care Teams include your primary Cardiologist (physician) and Advanced Practice Providers (APPs -  Physician Assistants and Nurse Practitioners) who all work together to provide you with the care you need, when you need it.  We recommend signing up for the patient portal called "MyChart".  Sign up information is provided on this After Visit Summary.  MyChart is used to connect with patients for Virtual Visits (Telemedicine).  Patients are able to view lab/test results, encounter notes, upcoming appointments, etc.  Non-urgent messages can be sent to your provider as well.   To learn more about what you can do with MyChart, go to [email or web address].    Your next appointment:   Follow up  after Echo and Myoview  The format for your next appointment:   In Person  Provider:   Kate Sable, MD   Other Instructions       Signed, Kate Sable, MD  03/27/2020 12:37 PM    [place]

## 2020-03-27 NOTE — Patient Instructions (Signed)
Medication Instructions:  Your physician recommends that you continue on your current medications as directed. Please refer to the Current Medication list given to you today.  *If you need a refill on your cardiac medications before your next appointment, please call your pharmacy*   Lab Work: None Ordered If you have labs (blood work) drawn today and your tests are completely normal, you will receive your results only by: Marland Kitchen MyChart Message (if you have MyChart) OR . A paper copy in the mail If you have any lab test that is abnormal or we need to change your treatment, we will call you to review the results.   Testing/Procedures:  1.  Your physician has requested that you have an echocardiogram. Echocardiography is a painless test that uses sound waves to create images of your heart. It provides your doctor with information about the size and shape of your heart and how well your heart's chambers and valves are working. This procedure takes approximately one hour. There are no restrictions for this procedure.  2.  Your physician has requested that you have a lexiscan myoview.     [place]  Your caregiver has ordered a Stress Test with nuclear imaging. The purpose of this test is to evaluate the blood supply to your heart muscle. This procedure is referred to as a "Non-Invasive Stress Test." This is because other than having an IV started in your vein, nothing is inserted or "invades" your body. Cardiac stress tests are done to find areas of poor blood flow to the heart by determining the extent of coronary artery disease (CAD). Some patients exercise on a treadmill, which naturally increases the blood flow to your heart, while others who are  unable to walk on a treadmill due to physical limitations have a pharmacologic/chemical stress agent called Lexiscan . This medicine will mimic walking on a treadmill by temporarily increasing your coronary blood flow.     PLEASE REPORT TO [facility]  MEDICAL MALL ENTRANCE  THE VOLUNTEERS AT THE FIRST DESK WILL DIRECT YOU WHERE TO GO  **Please note: these test may take anywhere between 2-4 hours to complete   Date of Procedure:_____________________________________  Arrival Time for Procedure:______________________________      PLEASE NOTIFY THE OFFICE AT LEAST 24 HOURS IN ADVANCE IF YOU ARE UNABLE TO KEEP YOUR APPOINTMENT.  [phone] AND  PLEASE NOTIFY NUCLEAR MEDICINE AT [facility] AT LEAST 24 HOURS IN ADVANCE IF YOU ARE UNABLE TO KEEP YOUR APPOINTMENT. [phone]   How to prepare for your Myoview test:  1. Do not eat or drink after midnight 2. No caffeine for 24 hours prior to test 3. No smoking 24 hours prior to test. 4. Your medication may be taken with water.  If your doctor stopped a medication because of this test, do not take that medication. 5. Ladies, please do not wear dresses.  Skirts or pants are appropriate. Please wear a short sleeve shirt. 6. No perfume, cologne or lotion. 7. Wear comfortable walking shoes. No heels!      Follow-Up: At [facility], you and your health needs are our priority.  As part of our continuing mission to provide you with exceptional heart care, we have created designated Provider Care Teams.  These Care Teams include your primary Cardiologist (physician) and Advanced Practice Providers (APPs -  Physician Assistants and Nurse Practitioners) who all work together to provide you with the care you need, when you need it.  We recommend signing up for the patient portal called "  MyChart".  Sign up information is provided on this After Visit Summary.  MyChart is used to connect with patients for Virtual Visits (Telemedicine).  Patients are able to view lab/test results, encounter notes, upcoming appointments, etc.  Non-urgent messages can be sent to your provider as well.   To learn more about what you can do with MyChart, go to [email or web address].    Your next appointment:   Follow up  after Echo and Myoview  The format for your next appointment:   In Person  Provider:   Kate Sable, MD   Other Instructions

## 2020-03-28 ENCOUNTER — Ambulatory Visit (INDEPENDENT_AMBULATORY_CARE_PROVIDER_SITE_OTHER): Payer: 59

## 2020-03-28 ENCOUNTER — Encounter: Payer: Self-pay | Admitting: Family Medicine

## 2020-03-28 ENCOUNTER — Other Ambulatory Visit: Payer: Self-pay | Admitting: Family Medicine

## 2020-03-28 DIAGNOSIS — R079 Chest pain, unspecified: Secondary | ICD-10-CM

## 2020-03-28 DIAGNOSIS — R7989 Other specified abnormal findings of blood chemistry: Secondary | ICD-10-CM

## 2020-03-28 DIAGNOSIS — Z1231 Encounter for screening mammogram for malignant neoplasm of breast: Secondary | ICD-10-CM

## 2020-03-28 LAB — ECHOCARDIOGRAM COMPLETE
AR max vel: 2.83 cm2
AV Area VTI: 3.06 cm2
AV Area mean vel: 2.85 cm2
AV Mean grad: 5 mmHg
AV Peak grad: 9.2 mmHg
Ao pk vel: 1.52 m/s
Area-P 1/2: 4.21 cm2
Calc EF: 54.4 %
S' Lateral: 2.9 cm
Single Plane A2C EF: 53.5 %
Single Plane A4C EF: 53.4 %

## 2020-04-06 ENCOUNTER — Ambulatory Visit: Payer: 59 | Admitting: Cardiology

## 2020-04-06 MED ORDER — VITAMIN D3 1.25 MG (50000 UT) PO TABS: 1.0000 | ORAL_TABLET | ORAL | 1 refills | Status: DC

## 2020-04-06 NOTE — Telephone Encounter (Signed)
Looks like fT4 was unable to be added. Can we call to schedule lab visit only for confirmatory thyroid levels? Thank you!

## 2020-04-07 ENCOUNTER — Other Ambulatory Visit (INDEPENDENT_AMBULATORY_CARE_PROVIDER_SITE_OTHER): Payer: 59

## 2020-04-07 ENCOUNTER — Other Ambulatory Visit: Payer: Self-pay

## 2020-04-07 DIAGNOSIS — R7989 Other specified abnormal findings of blood chemistry: Secondary | ICD-10-CM | POA: Diagnosis not present

## 2020-04-07 LAB — T4, FREE: Free T4: 0.58 ng/dL — ABNORMAL LOW (ref 0.60–1.60)

## 2020-04-07 LAB — TSH: TSH: 5.35 u[IU]/mL — ABNORMAL HIGH (ref 0.35–4.50)

## 2020-04-08 ENCOUNTER — Encounter: Payer: Self-pay | Admitting: Family Medicine

## 2020-04-08 DIAGNOSIS — E039 Hypothyroidism, unspecified: Secondary | ICD-10-CM | POA: Insufficient documentation

## 2020-04-10 ENCOUNTER — Other Ambulatory Visit: Payer: Self-pay

## 2020-04-10 ENCOUNTER — Encounter
Admission: RE | Admit: 2020-04-10 | Discharge: 2020-04-10 | Disposition: A | Discharge status: Home / Self Care | Payer: 59 | Source: Ambulatory Visit | Attending: Cardiology | Admitting: Cardiology

## 2020-04-10 DIAGNOSIS — R079 Chest pain, unspecified: Secondary | ICD-10-CM

## 2020-04-10 DIAGNOSIS — R0609 Other forms of dyspnea: Secondary | ICD-10-CM

## 2020-04-10 DIAGNOSIS — R0600 Dyspnea, unspecified: Secondary | ICD-10-CM | POA: Insufficient documentation

## 2020-04-10 LAB — NM MYOCAR MULTI W/SPECT W/WALL MOTION / EF
Estimated workload: 1 METS
Exercise duration (min): 0 min
Exercise duration (sec): 0 s
LV dias vol: 78 mL (ref 46–106)
LV sys vol: 30 mL
MPHR: 162 {beats}/min
Peak HR: 104 {beats}/min
Percent HR: 64 %
Rest HR: 69 {beats}/min
SDS: 4
SRS: 2
SSS: 4
TID: 1.04

## 2020-04-10 MED ORDER — TECHNETIUM TC 99M TETROFOSMIN IV KIT
10.4100 | PACK | Freq: Once | INTRAVENOUS | Status: AC | PRN
  Administered 2020-04-10: 10.41 via INTRAVENOUS

## 2020-04-10 MED ORDER — REGADENOSON 0.4 MG/5ML IV SOLN
0.4000 mg | Freq: Once | INTRAVENOUS | Status: AC
  Administered 2020-04-10: 0.4 mg via INTRAVENOUS

## 2020-04-10 MED ORDER — TECHNETIUM TC 99M TETROFOSMIN IV KIT
30.0000 | PACK | Freq: Once | INTRAVENOUS | Status: AC | PRN
  Administered 2020-04-10: 31.1 via INTRAVENOUS

## 2020-04-11 ENCOUNTER — Telehealth: Payer: Self-pay

## 2020-04-11 NOTE — Telephone Encounter (Signed)
The patient has been notified of the result via VM per DPR on file using interpreter [phone]

## 2020-04-13 ENCOUNTER — Ambulatory Visit
Admission: RE | Admit: 2020-04-13 | Discharge: 2020-04-13 | Disposition: A | Discharge status: Home / Self Care | Payer: 59 | Source: Ambulatory Visit | Attending: Family Medicine | Admitting: Family Medicine

## 2020-04-13 ENCOUNTER — Other Ambulatory Visit: Payer: Self-pay

## 2020-04-13 DIAGNOSIS — Z1231 Encounter for screening mammogram for malignant neoplasm of breast: Secondary | ICD-10-CM | POA: Diagnosis not present

## 2020-04-21 ENCOUNTER — Encounter: Payer: Self-pay | Admitting: Cardiology

## 2020-04-21 ENCOUNTER — Ambulatory Visit (INDEPENDENT_AMBULATORY_CARE_PROVIDER_SITE_OTHER): Payer: 59 | Admitting: Cardiology

## 2020-04-21 ENCOUNTER — Other Ambulatory Visit: Payer: Self-pay

## 2020-04-21 VITALS — BP 100/72 | HR 96 | Ht 62.0 in | Wt 248.0 lb

## 2020-04-21 DIAGNOSIS — R079 Chest pain, unspecified: Secondary | ICD-10-CM | POA: Diagnosis not present

## 2020-04-21 DIAGNOSIS — E7800 Pure hypercholesterolemia, unspecified: Secondary | ICD-10-CM

## 2020-04-21 DIAGNOSIS — R0600 Dyspnea, unspecified: Secondary | ICD-10-CM | POA: Diagnosis not present

## 2020-04-21 DIAGNOSIS — R0609 Other forms of dyspnea: Secondary | ICD-10-CM

## 2020-04-21 NOTE — Patient Instructions (Signed)

## 2020-04-21 NOTE — Progress Notes (Signed)
Cardiology Office Note:    Date:  04/21/2020   ID:  YANIS JUMA, DOB [birth year], MRN [id number]  PCP:  Ria Bush, MD  [facility] Cardiologist:  Kate Sable, MD  [place] Electrophysiologist:  None   Referring MD: Ria Bush, MD   Chief Complaint  Patient presents with   Follow-up    Review test results. Medications verbally reviewed with patient.      History of Present Illness:    Spanish interpreter used for today's visit.  Stacey Nichols is a 59 y.o. female with a hx of hyperlipidemia, former smoker x25+ years, AML status post chemotherapy 12 years ago, who presents for follow-up.  She was previously seen due to chest pain.  Chest pain symptoms are sometimes associated with stressful events.  Also endorses some shortness of breath and weight gain due to the current pandemic.  Due to risk factors, echo and Lexiscan Myoview was ordered.  Patient states doing okay, has no concerns or new complaints at this time.  She is hoping her heart function is okay as she is planning a trip to [place].   Past Medical History:  Diagnosis Date   AML (acute myeloblastic leukemia) ([place]) 2007   s/p chemo (Dr Lissa Merlin)   Anemia    hx of   Anxiety    Arthritis    severe   Constipation    Depression    GERD (gastroesophageal reflux disease)    H/O Bell's palsy    H/O hiatal hernia    Headache    occasional   High triglycerides    History of cancer chemotherapy    Leukemia ([place])    Osteopenia 06/08/2017   DEXA 2017 - T -1.8 spine   Pneumonia    hx of    Rheumatoid arthritis ([place])    Sleep apnea    Stroke ([place])    "mini stroke-caused bells palsy for 1 month"    Past Surgical History:  Procedure Laterality Date   ABDOMINAL HYSTERECTOMY  2001   heavy bleeding, precancerous cells, 1 ovary remains   LAPAROSCOPIC GASTRIC BAND REMOVAL WITH LAPAROSCOPIC GASTRIC SLEEVE RESECTION  04/26/2014   LAPAROSCOPIC GASTRIC BANDING  2000    performed in [place]   TUBAL LIGATION  1996   UPPER GI ENDOSCOPY N/A 04/26/2014   Procedure: UPPER GI ENDOSCOPY;  Surgeon: Pedro Earls, MD;  Location: [facility];  Service: General;  Laterality: N/A;    Current Medications: Current Meds  Medication Sig   acetaminophen (TYLENOL) 500 MG tablet Take 500 mg by mouth every 6 (six) hours as needed.   aspirin 81 MG tablet Take 81 mg by mouth daily.   Cholecalciferol (VITAMIN D3) 1.25 MG (50000 UT) TABS Take 1 tablet by mouth once a week.   rosuvastatin (CRESTOR) 10 MG tablet Take 1 tablet (10 mg total) by mouth at bedtime.     Allergies:   Latex, Sulfa antibiotics, and Tape   Social History   Socioeconomic History   Marital status: Single    Spouse name: Not on file   Number of children: Not on file   Years of education: Not on file   Highest education level: Not on file  Occupational History   Occupation: stay at home  Tobacco Use   Smoking status: Former Smoker    Years: 30.00    Types: Cigarettes    Quit date: 03/30/2018    Years since quitting: 2.0   Smokeless tobacco: Never Used  Vaping Use  Vaping Use: Never used  Substance and Sexual Activity   Alcohol use: Yes    Comment: occasional   Drug use: No   Sexual activity: Not on file  Other Topics Concern   Not on file  Social History Narrative   From [place]   Lives alone    Separated from husband after leukemia    Occ: retired, was Armed forces operational officer    Edu: 2 yrs college   Activity: no regular exercise    Diet: some water, fruits/vegetables daily    Social Determinants of Radio broadcast assistant Strain:    Difficulty of Paying Living Expenses: Not on file  Food Insecurity:    Worried About Charity fundraiser in the Last Year: Not on file   [employer] of Food in the Last Year: Not on file  Transportation Needs:    Lack of Transportation (Medical): Not on file   Lack of Transportation (Non-Medical): Not on file  Physical Activity:     Days of Exercise per Week: Not on file   Minutes of Exercise per Session: Not on file  Stress:    Feeling of Stress : Not on file  Social Connections:    Frequency of Communication with Friends and Family: Not on file   Frequency of Social Gatherings with Friends and Family: Not on file   Attends Religious Services: Not on file   Active Member of Clubs or Organizations: Not on file   Attends Archivist Meetings: Not on file   Marital Status: Not on file     Family History: The patient's family history includes CAD in her father; Cancer in her mother and sister; Diabetes in her maternal grandfather; Heart Problems in her father; Hyperlipidemia in her father; Obesity in her father. There is no history of Breast cancer.  ROS:   Please see the history of present illness.     All other systems reviewed and are negative.  EKGs/Labs/Other Studies Reviewed:    The following studies were reviewed today:   EKG:  EKG is  ordered today.  The ekg ordered today demonstrates normal sinus rhythm  Recent Labs: 03/24/2020: ALT 41; BUN 21; Creatinine, Ser 0.75; Hemoglobin 15.2; Platelets 184.0; Potassium 4.4; Sodium 141 04/07/2020: TSH 5.35  Recent Lipid Panel    Component Value Date/Time   CHOL 190 03/24/2020 0813   CHOL 153 09/02/2018 1353   CHOL 190 01/15/2013 0522   TRIG 176.0 (H) 03/24/2020 0813   TRIG 307 (H) 01/15/2013 0522   HDL 50.40 03/24/2020 0813   HDL 60 09/02/2018 1353   HDL 44 01/15/2013 0522   CHOLHDL 4 03/24/2020 0813   VLDL 35.2 03/24/2020 0813   VLDL 61 (H) 01/15/2013 0522   LDLCALC 104 (H) 03/24/2020 0813   LDLCALC 61 09/02/2018 1353   LDLCALC 85 01/15/2013 0522   LDLDIRECT 72.0 12/26/2017 0918    Physical Exam:    VS:  BP 100/72 (BP Location: Left Arm, Patient Position: Sitting, Cuff Size: Large)    Pulse 96    Ht 5\' 2"  (1.575 m)    Wt 248 lb (112.5 kg)    SpO2 96%    BMI 45.36 kg/m     Wt Readings from Last 3 Encounters:  04/21/20 248 lb  (112.5 kg)  03/27/20 246 lb 4 oz (111.7 kg)  03/24/20 243 lb 9 oz (110.5 kg)     GEN:  Well nourished, well developed in no acute distress, obese HEENT: Normal NECK: No JVD;  No carotid bruits LYMPHATICS: No lymphadenopathy CARDIAC: RRR, no murmurs, rubs, gallops RESPIRATORY:  Clear to auscultation without rales, wheezing or rhonchi  ABDOMEN: Soft, non-tender, distended MUSCULOSKELETAL:  No edema; No deformity  SKIN: Warm and dry NEUROLOGIC:  Alert and oriented x 3 PSYCHIATRIC:  Normal affect   ASSESSMENT:    1. Chest pain of uncertain etiology   2. Dyspnea on exertion   3. Pure hypercholesterolemia   4. Morbid obesity ([place])    PLAN:    In order of problems listed above:  1. Patient with history of chest pressure sometimes related to stress.  Has risk factors of hyperlipidemia, obesity.  Echocardiogram showed normal systolic function, impaired relaxation, EF 55 to 60%.  Lexiscan Myoview with no evidence for ischemia, low risk study.  Patient made aware of test results and reassured.  Impaired relaxation likely secondary to obesity, hyperlipidemia. 2. Dyspnea on exertion likely from obesity/deconditioning. 3. History of hyperlipidemia, continue statin. 4. Patient is morbidly obese.  Low calorie diet, weight loss advised.  Follow-up as needed.  Total encounter time 40 minutes  Greater than 50% was spent in counseling and coordination of care with the patient Time included but not limited to interpreter services.   Medication Adjustments/Labs and Tests Ordered: Current medicines are reviewed at length with the patient today.  Concerns regarding medicines are outlined above.  No orders of the defined types were placed in this encounter.  No orders of the defined types were placed in this encounter.   Patient Instructions  Medication Instructions:  Your physician recommends that you continue on your current medications as directed. Please refer to the Current Medication  list given to you today.  *If you need a refill on your cardiac medications before your next appointment, please call your pharmacy*   Lab Work: None Ordered If you have labs (blood work) drawn today and your tests are completely normal, you will receive your results only by:  [place] (if you have MyChart) OR  A paper copy in the mail If you have any lab test that is abnormal or we need to change your treatment, we will call you to review the results.   Testing/Procedures: None Ordered   Follow-Up: At [facility], you and your health needs are our priority.  As part of our continuing mission to provide you with exceptional heart care, we have created designated Provider Care Teams.  These Care Teams include your primary Cardiologist (physician) and Advanced Practice Providers (APPs -  Physician Assistants and Nurse Practitioners) who all work together to provide you with the care you need, when you need it.  We recommend signing up for the patient portal called "MyChart".  Sign up information is provided on this After Visit Summary.  MyChart is used to connect with patients for Virtual Visits (Telemedicine).  Patients are able to view lab/test results, encounter notes, upcoming appointments, etc.  Non-urgent messages can be sent to your provider as well.   To learn more about what you can do with MyChart, go to [email or web address].    Your next appointment:   Follow up as needed   The format for your next appointment:   In Person  Provider:   Kate Sable, MD   Other Instructions      Signed, Kate Sable, MD  04/21/2020 1:04 PM    [place]

## 2020-04-27 ENCOUNTER — Inpatient Hospital Stay
Admission: RE | Admit: 2020-04-27 | Discharge: 2020-04-27 | Disposition: A | Discharge status: Home / Self Care | Payer: Self-pay | Source: Ambulatory Visit | Attending: *Deleted | Admitting: *Deleted

## 2020-04-27 ENCOUNTER — Other Ambulatory Visit: Payer: Self-pay | Admitting: *Deleted

## 2020-04-27 DIAGNOSIS — Z1231 Encounter for screening mammogram for malignant neoplasm of breast: Secondary | ICD-10-CM

## 2020-04-27 LAB — HM MAMMOGRAPHY

## 2020-04-28 ENCOUNTER — Encounter: Payer: Self-pay | Admitting: Family Medicine

## 2020-05-06 ENCOUNTER — Other Ambulatory Visit: Payer: Self-pay | Admitting: Family Medicine

## 2020-05-06 DIAGNOSIS — Z9884 Bariatric surgery status: Secondary | ICD-10-CM

## 2020-05-06 DIAGNOSIS — E039 Hypothyroidism, unspecified: Secondary | ICD-10-CM

## 2020-05-06 DIAGNOSIS — E785 Hyperlipidemia, unspecified: Secondary | ICD-10-CM

## 2020-05-06 DIAGNOSIS — C9201 Acute myeloblastic leukemia, in remission: Secondary | ICD-10-CM

## 2020-05-06 DIAGNOSIS — R7303 Prediabetes: Secondary | ICD-10-CM

## 2020-05-06 DIAGNOSIS — E559 Vitamin D deficiency, unspecified: Secondary | ICD-10-CM

## 2020-05-08 ENCOUNTER — Other Ambulatory Visit: Payer: 59

## 2020-05-15 ENCOUNTER — Encounter: Payer: 59 | Admitting: Family Medicine

## 2020-06-14 HISTORY — PX: FACIAL COSMETIC SURGERY: SHX629

## 2020-06-14 HISTORY — PX: BLEPHAROPLASTY: SUR158

## 2020-06-28 ENCOUNTER — Other Ambulatory Visit: Payer: Self-pay

## 2020-06-28 ENCOUNTER — Ambulatory Visit (INDEPENDENT_AMBULATORY_CARE_PROVIDER_SITE_OTHER): Payer: 59 | Admitting: Family Medicine

## 2020-06-28 ENCOUNTER — Encounter: Payer: Self-pay | Admitting: Family Medicine

## 2020-06-28 VITALS — BP 120/82 | HR 76 | Temp 97.8°F | Ht 63.0 in | Wt 247.2 lb

## 2020-06-28 DIAGNOSIS — Z9884 Bariatric surgery status: Secondary | ICD-10-CM

## 2020-06-28 DIAGNOSIS — E039 Hypothyroidism, unspecified: Secondary | ICD-10-CM | POA: Diagnosis not present

## 2020-06-28 DIAGNOSIS — Z6841 Body Mass Index (BMI) 40.0 and over, adult: Secondary | ICD-10-CM

## 2020-06-28 DIAGNOSIS — E559 Vitamin D deficiency, unspecified: Secondary | ICD-10-CM | POA: Diagnosis not present

## 2020-06-28 DIAGNOSIS — B079 Viral wart, unspecified: Secondary | ICD-10-CM

## 2020-06-28 DIAGNOSIS — R7303 Prediabetes: Secondary | ICD-10-CM

## 2020-06-28 DIAGNOSIS — R682 Dry mouth, unspecified: Secondary | ICD-10-CM

## 2020-06-28 DIAGNOSIS — C9201 Acute myeloblastic leukemia, in remission: Secondary | ICD-10-CM

## 2020-06-28 DIAGNOSIS — E785 Hyperlipidemia, unspecified: Secondary | ICD-10-CM

## 2020-06-28 DIAGNOSIS — Z8742 Personal history of other diseases of the female genital tract: Secondary | ICD-10-CM

## 2020-06-28 DIAGNOSIS — Z0000 Encounter for general adult medical examination without abnormal findings: Secondary | ICD-10-CM

## 2020-06-28 DIAGNOSIS — Z0001 Encounter for general adult medical examination with abnormal findings: Secondary | ICD-10-CM

## 2020-06-28 DIAGNOSIS — Z1211 Encounter for screening for malignant neoplasm of colon: Secondary | ICD-10-CM

## 2020-06-28 DIAGNOSIS — R300 Dysuria: Secondary | ICD-10-CM

## 2020-06-28 DIAGNOSIS — K760 Fatty (change of) liver, not elsewhere classified: Secondary | ICD-10-CM

## 2020-06-28 LAB — IBC PANEL
Iron: 70 ug/dL (ref 42–145)
Saturation Ratios: 16.2 % — ABNORMAL LOW (ref 20.0–50.0)
Transferrin: 308 mg/dL (ref 212.0–360.0)

## 2020-06-28 LAB — BASIC METABOLIC PANEL
BUN: 14 mg/dL (ref 6–23)
CO2: 27 mEq/L (ref 19–32)
Calcium: 9.4 mg/dL (ref 8.4–10.5)
Chloride: 105 mEq/L (ref 96–112)
Creatinine, Ser: 0.79 mg/dL (ref 0.40–1.20)
GFR: 82.43 mL/min (ref >60.00)
Glucose, Bld: 106 mg/dL — ABNORMAL HIGH (ref 70–99)
Potassium: 4.4 mEq/L (ref 3.5–5.1)
Sodium: 141 mEq/L (ref 135–145)

## 2020-06-28 LAB — POC URINALSYSI DIPSTICK (AUTOMATED)
Bilirubin, UA: NEGATIVE
Blood, UA: NEGATIVE
Glucose, UA: NEGATIVE
Ketones, UA: NEGATIVE
Nitrite, UA: NEGATIVE
Protein, UA: POSITIVE — AB
Spec Grav, UA: >=1.03 — AB (ref 1.010–1.025)
Urobilinogen, UA: 0.2 E.U./dL
pH, UA: 5 (ref 5.0–8.0)

## 2020-06-28 LAB — VITAMIN D 25 HYDROXY (VIT D DEFICIENCY, FRACTURES): VITD: 18.49 ng/mL — ABNORMAL LOW (ref 30.00–100.00)

## 2020-06-28 LAB — CBC WITH DIFFERENTIAL/PLATELET
Basophils Absolute: 0 10*3/uL (ref 0.0–0.1)
Basophils Relative: 0.3 % (ref 0.0–3.0)
Eosinophils Absolute: 0.3 10*3/uL (ref 0.0–0.7)
Eosinophils Relative: 3.5 % (ref 0.0–5.0)
HCT: 45.9 % (ref 36.0–46.0)
Hemoglobin: 15.5 g/dL — ABNORMAL HIGH (ref 12.0–15.0)
Lymphocytes Relative: 36.5 % (ref 12.0–46.0)
Lymphs Abs: 2.7 10*3/uL (ref 0.7–4.0)
MCHC: 33.8 g/dL (ref 30.0–36.0)
MCV: 97.9 fl (ref 78.0–100.0)
Monocytes Absolute: 0.8 10*3/uL (ref 0.1–1.0)
Monocytes Relative: 10.9 % (ref 3.0–12.0)
Neutro Abs: 3.6 10*3/uL (ref 1.4–7.7)
Neutrophils Relative %: 48.8 % (ref 43.0–77.0)
Platelets: 193 10*3/uL (ref 150.0–400.0)
RBC: 4.69 Mil/uL (ref 3.87–5.11)
RDW: 14.3 % (ref 11.5–15.5)
WBC: 7.4 10*3/uL (ref 4.0–10.5)

## 2020-06-28 LAB — LIPID PANEL
Cholesterol: 115 mg/dL (ref 0–200)
HDL: 51.2 mg/dL (ref >39.00)
NonHDL: 64.05
Total CHOL/HDL Ratio: 2
Triglycerides: 228 mg/dL — ABNORMAL HIGH (ref 0.0–149.0)
VLDL: 45.6 mg/dL — ABNORMAL HIGH (ref 0.0–40.0)

## 2020-06-28 LAB — FERRITIN: Ferritin: 66.5 ng/mL (ref 10.0–291.0)

## 2020-06-28 LAB — FOLATE: Folate: 9.1 ng/mL (ref >5.9)

## 2020-06-28 LAB — LDL CHOLESTEROL, DIRECT: Direct LDL: 47 mg/dL

## 2020-06-28 LAB — URIC ACID: Uric Acid, Serum: 5.9 mg/dL (ref 2.4–7.0)

## 2020-06-28 LAB — T4, FREE: Free T4: 0.67 ng/dL (ref 0.60–1.60)

## 2020-06-28 LAB — VITAMIN B12: Vitamin B-12: 369 pg/mL (ref 211–911)

## 2020-06-28 LAB — MAGNESIUM: Magnesium: 2 mg/dL (ref 1.5–2.5)

## 2020-06-28 LAB — TSH: TSH: 3.45 u[IU]/mL (ref 0.35–4.50)

## 2020-06-28 MED ORDER — SAXENDA 18 MG/3ML ~~LOC~~ SOPN: PEN_INJECTOR | SUBCUTANEOUS | 0 refills | Status: DC

## 2020-06-28 MED ORDER — VITAMIN D3 1.25 MG (50000 UT) PO TABS
1.0000 | ORAL_TABLET | ORAL | 3 refills | Status: DC
Start: 2020-06-28 — End: 2020-11-24

## 2020-06-28 MED ORDER — CEPHALEXIN 500 MG PO CAPS: 500.0000 mg | ORAL_CAPSULE | Freq: Two times a day (BID) | ORAL | 0 refills | Status: DC

## 2020-06-28 NOTE — Assessment & Plan Note (Signed)
Not currently on replacement.  Rx vit D 50k units weekly sent in.

## 2020-06-28 NOTE — Assessment & Plan Note (Addendum)
Recent LFTs with slightly elevated ALT

## 2020-06-28 NOTE — Patient Instructions (Addendum)
Para verruga de la mano - use acido salicylico y puede aumentar la potencia cubriendolo con duct tape.  Trate saxenda inyeccion diaria para perdida de peso - dejeme saber como lo tolera.  La remiteremos para Charity fundraiser.  Llame para cita con ginecologo.  Regresar en 1 mes para seguimiento de peso Comienze keflex para posible infeccion orinaria.   [place] son pequeos crecimientos en la piel. Son comunes y pueden aparecer en muchas zonas del cuerpo. [place] persona puede tener una o varias verrugas. En muchos casos, las verrugas no requieren Clinical research associate. Generalmente, desaparecen solas despus de transcurridos muchos meses o algunos aos. Si es necesario, las verrugas que causan problemas o no desaparecen por s solas pueden tratarse. Cules son las causas? Un tipo de virus llamado virus del Engineer, technical sales (VPH) causa las Development worker, international aid.  Este virus puede propagarse de Ardelia Mems persona a otra a travs del Youth worker.  Las verrugas tambin pueden diseminarse a otras partes del cuerpo si la persona se rasca la verruga y luego otra zona corporal. Qu incrementa el riesgo? Es ms probable que contraiga esta afeccin si:  Fidel Levy 10 y 13aos de edad.  Tiene debilitado el sistema de defensa del organismo (sistema inmunitario).  Es caucsico. Cules son los signos o los sntomas? El principal sntoma de esta afeccin son pequeas protuberancias en la piel. Las verrugas pueden:  Ser redondas u [place], o tener una forma irregular.  Tener una superficie spera.  Variar en color desde el tono de la piel a amarillo, marrn o gris claro.  Generalmente, su tamao no supera  pulgada (1,3cm).  Desaparecer y luego volver a aparecer ms adelante. La mayora de las verrugas son indoloras, pero algunas pueden causar dolor si son grandes o si aparecen en una zona del cuerpo en donde tendrn que soportar cierta presin, por ejemplo, la planta del pie. Cmo se  diagnostica? Generalmente, una verruga se diagnostica en funcin de su aspecto. En algunos casos, se puede extraer Truddie Coco de tejido (biopsia) para analizarla con un microscopio. Cmo se trata? En muchos casos, las verrugas no requieren Clinical research associate. A veces se desea recibir tratamiento. Si es necesario Building services engineer o si se lo desea, las opciones pueden incluir lo siguiente:  Aplicar soluciones medicinales, cremas o parches en la verruga. Pueden ser medicamentos recetados o de venta libre que suavizan la piel de modo que las capas se descamen gradualmente. En muchos casos, el medicamento se aplica una o dos veces por da y se cubre con un apsito.  Colocar cinta adhesiva aislante sobre la verruga (oclusin). Dejar colocada la cinta adhesiva durante el tiempo que le haya indicado el mdico y luego la reemplazar por Rodena Piety. Esto se realiza hasta que la verruga desaparece.  Congelar la verruga con nitrgeno lquido (crioterapia).  Quemar la verruga con lo siguiente: ? Surveyor, quantity. ? Una sonda con corriente elctrica (electrocauterizacin).  La inyeccin de Ecologist (antgeno de cndida) en la verruga para ayudar al sistema inmunitario del organismo a combatirla.  [place] para extirpar la verruga. Siga estas indicaciones en su casa: Medicamentos  Aplquese los medicamentos de venta libre y los recetados solamente como se lo haya indicado el mdico.  No se aplique medicamentos de venta libre para tratar las verrugas en el rostro ni en los genitales a menos que el mdico se lo indique. Estilo de vida  Mantenga sano su sistema inmunitario. Para hacer esto: ? Siga una dieta saludable y equilibrada. ? Duerma lo  suficiente. ? No consuma ningn producto que contenga nicotina o tabaco, como cigarrillos y Psychologist, sport and exercise. Si necesita ayuda para dejar de fumar, consulte al mdico. Indicaciones generales   [place] las manos despus de tocar una  verruga.  No se rasque ni se toque una verruga.  No se afeite los vellos que estn sobre una verruga.  Concurra a todas las visitas de seguimiento como se lo haya indicado el mdico. Esto es importante. Comunquese con un mdico si:  Las verrugas no mejoran despus del [place].  Tiene enrojecimiento, hinchazn o Management consultant de Civil Service fast streamer.  La verruga le sangra, y el sangrado no se detiene cuando ejerce una presin leve sobre la verruga.  Es diabtico y Armed forces logistics/support/administrative officer. Resumen  Las verrugas son pequeos crecimientos en la piel. Son comunes y pueden aparecer en muchas zonas del cuerpo.  En muchos casos, las verrugas no requieren Clinical research associate. A veces se desea recibir tratamiento. Si es necesario administrar tratamiento o si lo desea, hay varias opciones de tratamiento.  Aplquese los medicamentos de venta libre y los recetados solamente como se lo haya indicado el mdico.  [place] las manos despus de tocar una verruga.  Concurra a todas las visitas de seguimiento como se lo haya indicado el mdico. Esto es importante. Esta informacin no tiene Marine scientist el consejo del mdico. Asegrese de hacerle al mdico cualquier pregunta que tenga. Document Revised: 01/08/2018 Document Reviewed: 01/08/2018 Elsevier Patient Education  [place].

## 2020-06-28 NOTE — Assessment & Plan Note (Signed)
Continue yearly CBC, CMP, Mg, LDH, urate.

## 2020-06-28 NOTE — Assessment & Plan Note (Signed)
Check for sjogren syndrome.

## 2020-06-28 NOTE — Assessment & Plan Note (Addendum)
UA abnormal. Not enough to check microscopy. UCx sent.  In interim, start keflex 500mg  BID x 1 wk

## 2020-06-28 NOTE — Assessment & Plan Note (Signed)
To bilateral hands. Discussed etiology, handout provided.  rec OTC salicylic acid treatment augmented with duct tape.  RTC to consider cryotherapy if ineffective.

## 2020-06-28 NOTE — Assessment & Plan Note (Signed)
Update TSH and fT4, consider treatment if abnormal given endorsing hypothyroid symptoms (constipation, fatigue, weight gain)

## 2020-06-28 NOTE — Assessment & Plan Note (Signed)
Preventative protocols reviewed and updated unless pt declined. Discussed healthy diet and lifestyle.  

## 2020-06-28 NOTE — Assessment & Plan Note (Signed)
Recent A1c in prediabetes range. Continue to monitor.

## 2020-06-28 NOTE — Assessment & Plan Note (Signed)
Has stopped seeing [facility].  Asks about medication to help with weight loss. Previously on metformin.  Discussed saxenda as well as mechanism of action. No personal or fmhx MTC. She will price out.  RTC 4-6 wks f/u visit

## 2020-06-28 NOTE — Progress Notes (Signed)
Patient ID: Stacey Nichols, female    DOB: 1960/10/29, 59 y.o.   MRN: [id number]  This visit was conducted in person.  BP 120/82 (BP Location: Left Arm, Patient Position: Sitting, Cuff Size: Large)   Pulse 76   Temp 97.8 F (36.6 C) (Temporal)   Ht 5\' 3"  (1.6 m)   Wt 247 lb 3 oz (112.1 kg)   SpO2 95%   BMI 43.79 kg/m    CC: CPE Subjective:   HPI: MYRON LONA is a 59 y.o. female presenting on 06/28/2020 for Annual Exam (C/o feeling very tired for about 3 mos. ) and Dysuria (C/o burning with urination.  Sxs started about 2 mos ago.  Tried cranberry juice, helpful at first.  But burning is back. 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  Notes ongoing fatigue.  Notes ongoing dry mouth and dry eyes. Has been told has dry eyes by ophthalmologist - treating with eye drops multiple times a day. Ongoing fatigue. Endorses ongoing arthralgias without inflammatory arthritis/synovitis. Worse as day goes on. Some hand cramping and numbness.   2 month h/o urgency, frequency, incomplete emptying, mild dysuria. No hematuria. Cranberry tablets have helped but recurrent symptoms once she stops. No fevers/chills, significant flank pain or nausea/vomiting. She takes amoxicillin 2000mg  PRN dental work - has taken recently. Told needed abx ppx by ortho for knee replacement.   Recent chest pain s/p cardiac evaluation ([facility]) with echo showing EF 55-60%, impaired relaxation and lexiscan myoview showing no ischemia, low risk study. H/o calcium score of zero 07/2015.   Notes new lesions to hands (R palm, L thumb) has been using   Bilateral knee pain - s/p L knee surgery 10/2017. Upcoming R knee surgery 06/2018. Sees [place] ortho Dr Vickii Chafe.   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   Upcoming trip to [place] for christmas to visit ill mother. Asks about  COVID booster.  Asks about metformin for weight loss previously on 1000mg  daily. Unsure what she's tried previously. Discussed saxenda. No fmhx or personal history MTC.   Preventative: Colon cancer screening - colonoscopy ~2015, rec rpt 5 yrs (in [place]) Well woman exam - s/p hysterectomy 2001 precancerous cells, 1 ovary remains. Last well woman ~2017 ([place]). Q80yrs  Mammogram 04/2020 [facility]  DEXA 12/2015 - osteopenia (T -1.8 Lspine) Lung cancer screening - not eligible Flu shot yearly [place] 09/2019, 10/2019 shingrix - discussed.  Seat belt use discussed Sunscreen use discussed. No changing moles on skin. Ex smoker ~2010, previously 1/4 ppd for ~20 yrs Alcohol - seldom Dentist Q66mo  Eye exam - yearly  From [place] Lives alone  Separated from husband after leukemia  Occ: retired, was Armed forces operational officer  Edu: 2 yrs college Activity: no regular exercise  Diet: good ater, fruits/vegetables daily      Relevant past medical, surgical, family and social history reviewed and updated as indicated. Interim medical history since our last visit reviewed. Allergies and medications reviewed and updated. Outpatient Medications Prior to Visit  Medication Sig Dispense Refill  . acetaminophen (TYLENOL) 500 MG tablet Take 500 mg by mouth every 6 (six) hours as needed.    Marland Kitchen aspirin 81 MG tablet Take 81 mg by mouth daily.    . rosuvastatin (CRESTOR) 10 MG tablet Take 1 tablet (10  mg total) by mouth at bedtime. 90 tablet 3  . Cholecalciferol (VITAMIN D3) 125 MCG (5000 UT) CAPS Take 1 capsule by mouth once a week.    . Cholecalciferol (VITAMIN D3) 1.25 MG (50000 UT) TABS Take 1 tablet by mouth once a week. (Patient not taking: Reported on 06/28/2020) 12 tablet 1   No facility-administered medications prior to visit.     Per HPI unless specifically indicated in ROS section below Review of Systems  Constitutional: Positive for fatigue. Negative for activity change,  appetite change, chills, fever and unexpected weight change.  HENT: Negative for hearing loss.   Eyes: Negative for visual disturbance.  Respiratory: Negative for cough, chest tightness, shortness of breath and wheezing.   Cardiovascular: Positive for leg swelling. Negative for chest pain and palpitations.  Gastrointestinal: Negative for abdominal distention, abdominal pain, blood in stool, constipation, diarrhea, nausea and vomiting.  Genitourinary: Negative for difficulty urinating and hematuria.  Musculoskeletal: Negative for arthralgias, myalgias and neck pain.  Skin: Negative for rash.  Neurological: Negative for dizziness, seizures, syncope and headaches.  Hematological: Negative for adenopathy. Does not bruise/bleed easily.  Psychiatric/Behavioral: Negative for dysphoric mood. The patient is nervous/anxious.    Objective:  BP 120/82 (BP Location: Left Arm, Patient Position: Sitting, Cuff Size: Large)   Pulse 76   Temp 97.8 F (36.6 C) (Temporal)   Ht 5\' 3"  (1.6 m)   Wt 247 lb 3 oz (112.1 kg)   SpO2 95%   BMI 43.79 kg/m   Wt Readings from Last 3 Encounters:  06/28/20 247 lb 3 oz (112.1 kg)  04/21/20 248 lb (112.5 kg)  03/27/20 246 lb 4 oz (111.7 kg)      Physical Exam Vitals and nursing note reviewed.  Constitutional:      General: She is not in acute distress.    Appearance: Normal appearance. She is well-developed and well-nourished. She is obese. She is not ill-appearing.  HENT:     Head: Normocephalic and atraumatic.     Right Ear: Hearing, tympanic membrane, ear canal and external ear normal.     Left Ear: Hearing, tympanic membrane, ear canal and external ear normal.     Nose: Nose normal.     Mouth/Throat:     Mouth: Oropharynx is clear and moist and mucous membranes are normal.     Pharynx: No posterior oropharyngeal edema.  Eyes:     General: No scleral icterus.    Extraocular Movements: Extraocular movements intact and EOM normal.     Conjunctiva/sclera:  Conjunctivae normal.     Pupils: Pupils are equal, round, and reactive to light.  Neck:     Thyroid: No thyroid mass or thyromegaly.  Cardiovascular:     Rate and Rhythm: Normal rate and regular rhythm.     Pulses: Normal pulses and intact distal pulses.          Radial pulses are 2+ on the right side and 2+ on the left side.     Heart sounds: Normal heart sounds. No murmur heard.   Pulmonary:     Effort: Pulmonary effort is normal. No respiratory distress.     Breath sounds: Normal breath sounds. No wheezing, rhonchi or rales.  Abdominal:     General: Abdomen is flat. Bowel sounds are normal. There is no distension.     Palpations: Abdomen is soft. There is no mass.     Tenderness: There is abdominal tenderness (mild) in the left upper quadrant and left lower quadrant. There is  no right CVA tenderness, left CVA tenderness, guarding or rebound. Negative signs include Murphy's sign.     Hernia: No hernia is present.  Musculoskeletal:        General: No edema. Normal range of motion.     Cervical back: Normal range of motion and neck supple.     Right lower leg: No edema.     Left lower leg: No edema.  Lymphadenopathy:     Cervical: No cervical adenopathy.  Skin:    General: Skin is warm and dry.     Findings: No rash.  Neurological:     General: No focal deficit present.     Mental Status: She is alert and oriented to person, place, and time.     Comments: CN grossly intact, station and gait intact  Psychiatric:        Mood and Affect: Mood and affect and mood normal.        Behavior: Behavior normal.        Thought Content: Thought content normal.        Judgment: Judgment normal.       Results for orders placed or performed in visit on 06/28/20  POCT Urinalysis Dipstick (Automated)  Result Value Ref Range   Color, UA yellow    Clarity, UA clear    Glucose, UA Negative Negative   Bilirubin, UA negative    Ketones, UA negative    Spec Grav, UA >=1.030 (A) 1.010 - 1.025    Blood, UA negative    pH, UA 5.0 5.0 - 8.0   Protein, UA Positive (A) Negative   Urobilinogen, UA 0.2 0.2 or 1.0 E.U./dL   Nitrite, UA negative    Leukocytes, UA Moderate (2+) (A) Negative   Lab Results  Component Value Date   HGBA1C 6.3 03/24/2020    Lab Results  Component Value Date   TSH 5.35 (H) 04/07/2020   Lab Results  Component Value Date   ALT 41 (H) 03/24/2020   AST 26 03/24/2020   ALKPHOS 80 03/24/2020   BILITOT 0.7 03/24/2020   Assessment & Plan:  This visit occurred during the SARS-CoV-2 public health emergency.  Safety protocols were in place, including screening questions prior to the visit, additional usage of staff PPE, and extensive cleaning of exam room while observing appropriate contact time as indicated for disinfecting solutions.   Problem List Items Addressed This Visit    Vitamin D deficiency    Not currently on replacement.  Rx vit D 50k units weekly sent in.       Verruca vulgaris    To bilateral hands. Discussed etiology, handout provided.  rec OTC salicylic acid treatment augmented with duct tape.  RTC to consider cryotherapy if ineffective.      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       Prediabetes    Recent A1c in prediabetes range. Continue to monitor.       NAFLD (nonalcoholic fatty liver disease)    Recent LFTs with slightly elevated ALT      Morbid obesity with BMI of 40.0-44.9, adult ([place])    Has stopped seeing [facility].  Asks about medication to help with weight loss. Previously on metformin.  Discussed saxenda as well as mechanism of action. No personal or fmhx MTC. She will price out.  RTC 4-6 wks f/u visit  Relevant Medications   Liraglutide -Weight Management (SAXENDA) 18 MG/3ML SOPN   Hypothyroidism (acquired)    Update TSH and fT4, consider treatment if abnormal given endorsing hypothyroid  symptoms (constipation, fatigue, weight gain)      History of abnormal cervical Pap smear    Advised call GYN for well woman exam.       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 Discussed healthy diet and lifestyle.       Dysuria    UA abnormal. Not enough to check microscopy. UCx sent.  In interim, start keflex 500mg  BID x 1 wk      Relevant Orders   POCT Urinalysis Dipstick (Automated) (Completed)   Urine Culture   Dyslipidemia    Has restarted crestor. Update FLP. Goal LDL <100. Calcium score of zero 07/2015.  The ASCVD Risk score Mikey Bussing DC Jr., et al., 2013) failed to calculate for the following reasons:   The patient has a prior MI or stroke diagnosis       Relevant Orders   Lipid panel   Basic metabolic panel   Dry mouth    Check for sjogren syndrome.       Relevant Orders   Sjogren's syndrome antibods(ssa + ssb)   AML (acute myeloid leukemia) in remission ([place])    Continue yearly CBC, CMP, Mg, LDH, urate.       Relevant Medications   cephALEXin (KEFLEX) 500 MG capsule   Other Relevant Orders   Lactate dehydrogenase   Magnesium   Uric acid    Other Visit Diagnoses    Special screening for malignant neoplasms, colon       Relevant Orders   Ambulatory referral to Gastroenterology       Meds ordered this encounter  Medications  . Cholecalciferol (VITAMIN D3) 1.25 MG (50000 UT) TABS    Sig: Take 1 tablet by mouth once a week.    Dispense:  12 tablet    Refill:  3  . Liraglutide -Weight Management (SAXENDA) 18 MG/3ML SOPN    Sig: Inject 0.6 mg into the skin daily for 7 days, THEN 1.2 mg daily for 7 days, THEN 1.8 mg daily for 7 days, THEN 2.4 mg daily for 7 days, THEN 3 mg daily.    Dispense:  15 mL    Refill:  0  . cephALEXin (KEFLEX) 500 MG capsule     Sig: Take 1 capsule (500 mg total) by mouth 2 (two) times daily.    Dispense:  14 capsule    Refill:  0   Orders Placed This Encounter  Procedures  . Urine Culture  . Lipid panel  . Basic metabolic panel  . Sjogren's syndrome antibods(ssa + ssb)  . CBC with Differential/Platelet  . Lactate dehydrogenase  . Magnesium  . Uric acid  . Ambulatory referral to Gastroenterology    Referral Priority:   Routine    Referral Type:   Consultation    Referral Reason:   Specialty Services Required    Number of Visits Requested:   1  . POCT Urinalysis Dipstick (Automated)    Patient instructions: Para verruga de la mano - use acido salicylico y puede aumentar la potencia cubriendolo con duct tape.  Trate saxenda inyeccion diaria para perdida de peso - dejeme saber como lo tolera.  La remiteremos para  colonoscopia en [place].  Llame para cita con ginecologo.  Regresar en 1 mes para seguimiento de peso Comienze keflex para posible infeccion orinaria.   Follow up plan: Return in about 4 weeks (around 07/26/2020) for follow up visit.  Ria Bush, MD

## 2020-06-28 NOTE — Assessment & Plan Note (Signed)
Update labs.  

## 2020-06-28 NOTE — Assessment & Plan Note (Signed)
Advised call GYN for well woman exam.

## 2020-06-28 NOTE — Assessment & Plan Note (Signed)
Has restarted crestor. Update FLP. Goal LDL <100. Calcium score of zero 07/2015.  The ASCVD Risk score Mikey Bussing DC Jr., et al., 2013) failed to calculate for the following reasons:   The patient has a prior MI or stroke diagnosis

## 2020-06-29 LAB — SJOGREN'S SYNDROME ANTIBODS(SSA + SSB)
SSA (Ro) (ENA) Antibody, IgG: <1 AI
SSB (La) (ENA) Antibody, IgG: <1 AI

## 2020-06-29 LAB — URINE CULTURE
MICRO NUMBER:: 11321385
SPECIMEN QUALITY:: ADEQUATE

## 2020-07-02 LAB — LACTATE DEHYDROGENASE: LDH: 174 U/L (ref 120–250)

## 2020-07-02 LAB — VITAMIN B1: Vitamin B1 (Thiamine): 9 nmol/L (ref 8–30)

## 2020-07-08 ENCOUNTER — Other Ambulatory Visit: Payer: Self-pay | Admitting: Family Medicine

## 2020-07-08 ENCOUNTER — Encounter: Payer: Self-pay | Admitting: Family Medicine

## 2020-07-08 DIAGNOSIS — E538 Deficiency of other specified B group vitamins: Secondary | ICD-10-CM | POA: Insufficient documentation

## 2020-07-08 MED ORDER — B-12 1000 MCG SL SUBL: 1.0000 | SUBLINGUAL_TABLET | Freq: Every day | SUBLINGUAL | Status: DC

## 2020-07-10 ENCOUNTER — Telehealth (INDEPENDENT_AMBULATORY_CARE_PROVIDER_SITE_OTHER): Payer: Self-pay | Admitting: Gastroenterology

## 2020-07-10 DIAGNOSIS — Z8669 Personal history of other diseases of the nervous system and sense organs: Secondary | ICD-10-CM | POA: Insufficient documentation

## 2020-07-10 DIAGNOSIS — K7581 Nonalcoholic steatohepatitis (NASH): Secondary | ICD-10-CM | POA: Insufficient documentation

## 2020-07-10 DIAGNOSIS — R069 Unspecified abnormalities of breathing: Secondary | ICD-10-CM | POA: Insufficient documentation

## 2020-07-10 DIAGNOSIS — Z8673 Personal history of transient ischemic attack (TIA), and cerebral infarction without residual deficits: Secondary | ICD-10-CM | POA: Insufficient documentation

## 2020-07-10 DIAGNOSIS — Z9889 Other specified postprocedural states: Secondary | ICD-10-CM | POA: Insufficient documentation

## 2020-07-10 DIAGNOSIS — Z8719 Personal history of other diseases of the digestive system: Secondary | ICD-10-CM | POA: Insufficient documentation

## 2020-07-10 DIAGNOSIS — R599 Enlarged lymph nodes, unspecified: Secondary | ICD-10-CM | POA: Insufficient documentation

## 2020-07-10 DIAGNOSIS — I639 Cerebral infarction, unspecified: Secondary | ICD-10-CM | POA: Insufficient documentation

## 2020-07-10 DIAGNOSIS — R7989 Other specified abnormal findings of blood chemistry: Secondary | ICD-10-CM | POA: Insufficient documentation

## 2020-07-10 DIAGNOSIS — E781 Pure hyperglyceridemia: Secondary | ICD-10-CM | POA: Insufficient documentation

## 2020-07-10 DIAGNOSIS — R059 Cough, unspecified: Secondary | ICD-10-CM | POA: Insufficient documentation

## 2020-07-10 DIAGNOSIS — G510 Bell's palsy: Secondary | ICD-10-CM | POA: Insufficient documentation

## 2020-07-10 DIAGNOSIS — M2560 Stiffness of unspecified joint, not elsewhere classified: Secondary | ICD-10-CM | POA: Insufficient documentation

## 2020-07-10 DIAGNOSIS — C9590 Leukemia, unspecified not having achieved remission: Secondary | ICD-10-CM | POA: Insufficient documentation

## 2020-07-10 DIAGNOSIS — F32A Depression, unspecified: Secondary | ICD-10-CM | POA: Insufficient documentation

## 2020-07-10 DIAGNOSIS — Z1211 Encounter for screening for malignant neoplasm of colon: Secondary | ICD-10-CM

## 2020-07-10 DIAGNOSIS — R200 Anesthesia of skin: Secondary | ICD-10-CM | POA: Insufficient documentation

## 2020-07-10 DIAGNOSIS — J039 Acute tonsillitis, unspecified: Secondary | ICD-10-CM | POA: Insufficient documentation

## 2020-07-10 DIAGNOSIS — R1013 Epigastric pain: Secondary | ICD-10-CM | POA: Insufficient documentation

## 2020-07-10 DIAGNOSIS — T819XXA Unspecified complication of procedure, initial encounter: Secondary | ICD-10-CM | POA: Insufficient documentation

## 2020-07-10 DIAGNOSIS — J988 Other specified respiratory disorders: Secondary | ICD-10-CM | POA: Insufficient documentation

## 2020-07-10 MED ORDER — NA SULFATE-K SULFATE-MG SULF 17.5-3.13-1.6 GM/177ML PO SOLN: 1.0000 | Freq: Once | ORAL | 0 refills | Status: AC

## 2020-07-10 NOTE — Progress Notes (Signed)
Gastroenterology Pre-Procedure Review  Request Date: Monday 08/14/20 Requesting Physician: Dr. Vicente Males  PATIENT REVIEW QUESTIONS: The patient responded to the following health history questions as indicated:    1. Are you having any GI issues? no Gastric Bands 2015 2. Do you have a personal history of Polyps? no 3. Do you have a family history of Colon Cancer or Polyps? no 4. Diabetes Mellitus? no 5. Joint replacements in the past 12 months?Knee Replacement performed in 2019 (L and R Knee) 6. Major health problems in the past 3 months?no 7. Any artificial heart valves, MVP, or defibrillator?no    MEDICATIONS & ALLERGIES:    Patient reports the following regarding taking any anticoagulation/antiplatelet therapy:   Plavix, Coumadin, Eliquis, Xarelto, Lovenox, Pradaxa, Brilinta, or Effient? no Aspirin? yes (81 mg daily)  Patient confirms/reports the following medications:  Current Outpatient Medications  Medication Sig Dispense Refill  . acetaminophen (TYLENOL) 500 MG tablet Take 500 mg by mouth every 6 (six) hours as needed.    Marland Kitchen aspirin 81 MG tablet Take 81 mg by mouth daily.    . Cholecalciferol (VITAMIN D3) 1.25 MG (50000 UT) TABS Take 1 tablet by mouth once a week. 12 tablet 3  . Cyanocobalamin (B-12) 1000 MCG SUBL Place 1 tablet under the tongue daily.    . Ibuprofen (ADVIL PO) Take by mouth.    . rosuvastatin (CRESTOR) 10 MG tablet Take 1 tablet (10 mg total) by mouth at bedtime. 90 tablet 3  . cephALEXin (KEFLEX) 500 MG capsule Take 1 capsule (500 mg total) by mouth 2 (two) times daily. (Patient not taking: Reported on 07/10/2020) 14 capsule 0  . Liraglutide -Weight Management (SAXENDA) 18 MG/3ML SOPN Inject 0.6 mg into the skin daily for 7 days, THEN 1.2 mg daily for 7 days, THEN 1.8 mg daily for 7 days, THEN 2.4 mg daily for 7 days, THEN 3 mg daily. (Patient not taking: Reported on 07/10/2020) 15 mL 0   No current facility-administered medications for this visit.    Patient  confirms/reports the following allergies:  Allergies  Allergen Reactions  . Latex Other (See Comments) and Itching  . Sulfa Antibiotics Rash    [Rash] rash - paper tape OK  . Tape Rash    [Rash] rash - paper tape OK    No orders of the defined types were placed in this encounter.   AUTHORIZATION INFORMATION Primary Insurance: 1D#: Group #:  Secondary Insurance: 1D#: Group #:  SCHEDULE INFORMATION: Date: Monday 01/31/222 Time: Location:[facility]

## 2020-08-09 ENCOUNTER — Telehealth: Payer: Self-pay | Admitting: Gastroenterology

## 2020-08-09 ENCOUNTER — Encounter: Payer: Self-pay | Admitting: Family Medicine

## 2020-08-09 ENCOUNTER — Other Ambulatory Visit: Payer: Self-pay

## 2020-08-09 ENCOUNTER — Ambulatory Visit (INDEPENDENT_AMBULATORY_CARE_PROVIDER_SITE_OTHER): Payer: 59 | Admitting: Family Medicine

## 2020-08-09 VITALS — BP 116/80 | HR 71 | Temp 97.7°F | Ht 63.0 in | Wt 249.6 lb

## 2020-08-09 DIAGNOSIS — Z23 Encounter for immunization: Secondary | ICD-10-CM | POA: Diagnosis not present

## 2020-08-09 DIAGNOSIS — E538 Deficiency of other specified B group vitamins: Secondary | ICD-10-CM

## 2020-08-09 DIAGNOSIS — Z01419 Encounter for gynecological examination (general) (routine) without abnormal findings: Secondary | ICD-10-CM

## 2020-08-09 DIAGNOSIS — Z6841 Body Mass Index (BMI) 40.0 and over, adult: Secondary | ICD-10-CM

## 2020-08-09 DIAGNOSIS — Z9884 Bariatric surgery status: Secondary | ICD-10-CM

## 2020-08-09 MED ORDER — SAXENDA 18 MG/3ML ~~LOC~~ SOPN: PEN_INJECTOR | SUBCUTANEOUS | 0 refills | Status: DC

## 2020-08-09 MED ORDER — B COMPLEX VITAMINS PO CAPS: 1.0000 | ORAL_CAPSULE | Freq: Every day | ORAL | 3 refills | Status: DC

## 2020-08-09 MED ORDER — THERA VITAL M PO TABS: 1.0000 | ORAL_TABLET | Freq: Every day | ORAL | Status: DC

## 2020-08-09 NOTE — Telephone Encounter (Signed)
Please call to reschedule procedure  

## 2020-08-09 NOTE — Assessment & Plan Note (Signed)
Encouraged healthy diet and lifestyle choices to affect sustainable weight loss.  saxenda currently unaffordable.  24 hour food recall reviewed - encouraged more protein intake. Discussed importance of healthy breakfast avoiding glycemic spikes with source of protein to set tone for rest of day.  She will check on insurance coverage for nutritionist referral.  RTC 4-6 wks f/u visit weight management.

## 2020-08-09 NOTE — Progress Notes (Signed)
Patient ID: Stacey Nichols, female    DOB: 1961-04-13, 60 y.o.   MRN: [id number]  This visit was conducted in person.  BP 116/80 (BP Location: Left Arm, Patient Position: Sitting, Cuff Size: Large)   Pulse 71   Temp 97.7 F (36.5 C) (Temporal)   Ht 5\' 3"  (1.6 m)   Wt 249 lb 9 oz (113.2 kg)   SpO2 96%   BMI 44.21 kg/m    CC: weight management f/u visit  Subjective:   HPI: Stacey Nichols is a 60 y.o. female presenting on 08/09/2020 for Follow-up (Here for 4-6 wk wt f/u.)   Recent trip to [place] to visit ill mother.  She also had blepharoplasty and face lift while in [place].   Previously followed by healthy weight [facility] wellness center.  Last visit we prescribed saxenda - unaffordable ($250).   Starting weight: 247 lbs Last weight:  Today's weight 249 lbs  24 hour recall: Breakfast - 6 sweet cookies and 8 oz coffee with milk/sugar  Lunch - black beans, 1/2 avocado, 3 small quesadillas, drank diet soda  Snack - peanuts  Dinner - 2 apples, avocado/cheese/mayo sandwich with whole grain bread, chamomile tea with brown sugar   Previously saw nutritionist - will check on insurance coverage for new referral (in [place]).   Activity regimen: No regular exercise. Difficulty with recent travel. She has stationary bike she can use.   S/p gastric band surgery ~2000 "swedish band" then sleeve gastrectomy 04/2014.  Requests GYN referral.      Relevant past medical, surgical, family and social history reviewed and updated as indicated. Interim medical history since our last visit reviewed. Allergies and medications reviewed and updated. Outpatient Medications Prior to Visit  Medication Sig Dispense Refill  . acetaminophen (TYLENOL) 500 MG tablet Take 500 mg by mouth every 6 (six) hours as needed.    Marland Kitchen aspirin 81 MG tablet Take 81 mg by mouth daily.    . Cholecalciferol (VITAMIN D3) 1.25 MG (50000 UT) TABS Take 1 tablet by mouth once a week. 12 tablet 3  . Ibuprofen (ADVIL PO) Take by  mouth.    . rosuvastatin (CRESTOR) 10 MG tablet Take 1 tablet (10 mg total) by mouth at bedtime. 90 tablet 3  . cephALEXin (KEFLEX) 500 MG capsule Take 1 capsule (500 mg total) by mouth 2 (two) times daily. (Patient not taking: Reported on 07/10/2020) 14 capsule 0  . Cyanocobalamin (B-12) 1000 MCG SUBL Place 1 tablet under the tongue daily.    . Liraglutide -Weight Management (SAXENDA) 18 MG/3ML SOPN Inject 0.6 mg into the skin daily for 7 days, THEN 1.2 mg daily for 7 days, THEN 1.8 mg daily for 7 days, THEN 2.4 mg daily for 7 days, THEN 3 mg daily. (Patient not taking: Reported on 07/10/2020) 15 mL 0   No facility-administered medications prior to visit.     Per HPI unless specifically indicated in ROS section below Review of Systems Objective:  BP 116/80 (BP Location: Left Arm, Patient Position: Sitting, Cuff Size: Large)   Pulse 71   Temp 97.7 F (36.5 C) (Temporal)   Ht 5\' 3"  (1.6 m)   Wt 249 lb 9 oz (113.2 kg)   SpO2 96%   BMI 44.21 kg/m   Wt Readings from Last 3 Encounters:  08/09/20 249 lb 9 oz (113.2 kg)  06/28/20 247 lb 3 oz (112.1 kg)  04/21/20 248 lb (112.5 kg)      Physical Exam Vitals and nursing  note reviewed.  Constitutional:      Appearance: Normal appearance. She is obese. She is not ill-appearing.  Neck:     Thyroid: No thyroid mass or thyromegaly.  Cardiovascular:     Rate and Rhythm: Normal rate and regular rhythm.     Pulses: Normal pulses.     Heart sounds: Normal heart sounds. No murmur heard.   Pulmonary:     Effort: Pulmonary effort is normal. No respiratory distress.     Breath sounds: Normal breath sounds. No wheezing, rhonchi or rales.  Neurological:     Mental Status: She is alert.  Psychiatric:        Mood and Affect: Mood normal.        Behavior: Behavior normal.       Lab Results  Component Value Date   HGBA1C 6.3 03/24/2020   Lab Results  Component Value Date   TSH 3.45 06/28/2020   T3TOTAL 137 01/21/2017   T4TOTAL 7.5  03/01/2014    Assessment & Plan:  This visit occurred during the SARS-CoV-2 public health emergency.  Safety protocols were in place, including screening questions prior to the visit, additional usage of staff PPE, and extensive cleaning of exam room while observing appropriate contact time as indicated for disinfecting solutions.   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  saxenda currently unaffordable.  24 hour food recall reviewed - encouraged more protein intake. Discussed importance of healthy breakfast avoiding glycemic spikes with source of protein to set tone for rest of day.  She will check on insurance coverage for nutritionist referral.  RTC 4-6 wks f/u visit weight management.       Relevant Medications   Liraglutide -Weight Management (SAXENDA) 18 MG/3ML SOPN   Low serum vitamin B12    rec start b complex vitamin given low normal b12 and b1 s/p sleeve gastrectomy       Other Visit Diagnoses    Well woman exam with routine gynecological exam       Relevant Orders   Ambulatory referral to Gynecology   Need for shingles vaccine       Relevant Orders   Varicella-zoster vaccine IM (Completed)       Meds ordered this encounter  Medications  . b complex vitamins capsule    Sig: Take 1 capsule by mouth daily.    Dispense:  90 capsule    Refill:  3  . Liraglutide -Weight Management (SAXENDA) 18 MG/3ML SOPN    Sig: Inject 0.6 mg into the skin daily for 7 days, THEN 1.2 mg daily for 7 days, THEN 1.8 mg daily for 7 days, THEN 2.4 mg daily for 7 days, THEN 3 mg daily.    Dispense:  15 mL    Refill:  0  . Multiple Vitamins-Minerals (MULTIVITAMIN) tablet    Sig: Take 1 tablet by mouth daily.   Orders Placed This Encounter  Procedures  . Varicella-zoster vaccine IM  .  Ambulatory referral to Gynecology    Referral Priority:   Routine    Referral Type:   Consultation    Referral Reason:   Specialty Services Required    Requested Specialty:   Gynecology    Number of Visits Requested:   1    Patient Instructions  Shingrix vaccine today  Revise website para coupon para Saxenda  https://[email or web address]/[email or web address]/[email or web address] Revise si seguro cubre remision a nutricionista de nuevo.  La remitire a Geophysicist/field seismologist.  Comienze complejo de vitamina B.   Follow up plan: Return in about 6 weeks (around 09/20/2020), or if symptoms worsen or fail to improve, for follow up visit.  Ria Bush, MD

## 2020-08-09 NOTE — Assessment & Plan Note (Addendum)
rec start b complex vitamin given low normal b12 and b1 s/p sleeve gastrectomy

## 2020-08-09 NOTE — Telephone Encounter (Signed)
Can you reschedule procedure for me?

## 2020-08-09 NOTE — Patient Instructions (Addendum)
Shingrix vaccine today  Revise website para coupon para Saxenda  https://www.[email or web address]/saxenda/[email or web address] Revise si seguro cubre remision a nutricionista de nuevo.  La remitire a Geophysicist/field seismologist.  Comienze complejo de vitamina B.

## 2020-08-10 ENCOUNTER — Other Ambulatory Visit: Payer: 59

## 2020-08-10 NOTE — Telephone Encounter (Signed)
Called patient back and had to leave her a detailed message to call me back so I could reschedule her procedure.

## 2020-08-14 ENCOUNTER — Ambulatory Visit: Admission: RE | Admit: 2020-08-14 | Payer: 59 | Source: Ambulatory Visit | Admitting: Gastroenterology

## 2020-08-14 ENCOUNTER — Encounter: Admission: RE | Payer: Self-pay | Source: Ambulatory Visit

## 2020-08-14 SURGERY — COLONOSCOPY WITH PROPOFOL
Anesthesia: General

## 2020-08-16 ENCOUNTER — Telehealth: Payer: Self-pay

## 2020-08-16 ENCOUNTER — Encounter: Payer: Self-pay | Admitting: *Deleted

## 2020-08-16 ENCOUNTER — Telehealth: Payer: 59

## 2020-08-16 NOTE — Telephone Encounter (Signed)
Patient was scheduled for 10 am to reschedule her colonoscopy.  Attempted to contact her at 10 am and again at 10:15am.  LVM for her to call office back to reschedule.  Thanks,  [place], [place]

## 2020-08-18 ENCOUNTER — Telehealth: Payer: Self-pay

## 2020-08-18 NOTE — Telephone Encounter (Signed)
Called patient and she did not answer the call. I will send her a letter letting her know that she is needing to call us to schedule a colonoscopy.

## 2020-08-23 LAB — HM DIABETES EYE EXAM

## 2020-08-25 ENCOUNTER — Other Ambulatory Visit: Payer: Self-pay

## 2020-08-25 ENCOUNTER — Telehealth (INDEPENDENT_AMBULATORY_CARE_PROVIDER_SITE_OTHER): Payer: 59 | Admitting: Gastroenterology

## 2020-08-25 DIAGNOSIS — Z1211 Encounter for screening for malignant neoplasm of colon: Secondary | ICD-10-CM

## 2020-08-25 MED ORDER — PEG 3350-KCL-NA BICARB-NACL 420 G PO SOLR: 4000.0000 mL | Freq: Once | ORAL | 0 refills | Status: DC

## 2020-08-25 NOTE — Progress Notes (Signed)
Gastroenterology Pre-Procedure Review  Request Date: 09/19/20 Requesting Physician: Dr. Allen Norris  PATIENT REVIEW QUESTIONS: The patient responded to the following health history questions as indicated:    1. Are you having any GI issues? no Gastric Bands 2015 2. Do you have a personal history of Polyps? no 3. Do you have a family history of Colon Cancer or Polyps? no 4. Diabetes Mellitus? no 5. Joint replacements in the past 12 months?yes (R and L Knee Replacement 2019) 6. Major health problems in the past 3 months?no 7. Any artificial heart valves, MVP, or defibrillator?no    MEDICATIONS & ALLERGIES:    Patient reports the following regarding taking any anticoagulation/antiplatelet therapy:   Plavix, Coumadin, Eliquis, Xarelto, Lovenox, Pradaxa, Brilinta, or Effient? no Aspirin? yes (81 mg daily)  Patient confirms/reports the following medications:  Current Outpatient Medications  Medication Sig Dispense Refill  . acetaminophen (TYLENOL) 500 MG tablet Take 500 mg by mouth every 6 (six) hours as needed.    Marland Kitchen aspirin 81 MG tablet Take 81 mg by mouth daily.    Marland Kitchen b complex vitamins capsule Take 1 capsule by mouth daily. 90 capsule 3  . Cholecalciferol (VITAMIN D3) 1.25 MG (50000 UT) TABS Take 1 tablet by mouth once a week. 12 tablet 3  . Ibuprofen (ADVIL PO) Take by mouth.    . Liraglutide -Weight Management (SAXENDA) 18 MG/3ML SOPN Inject 0.6 mg into the skin daily for 7 days, THEN 1.2 mg daily for 7 days, THEN 1.8 mg daily for 7 days, THEN 2.4 mg daily for 7 days, THEN 3 mg daily. 15 mL 0  . Multiple Vitamins-Minerals (MULTIVITAMIN) tablet Take 1 tablet by mouth daily.    . rosuvastatin (CRESTOR) 10 MG tablet Take 1 tablet (10 mg total) by mouth at bedtime. 90 tablet 3   No current facility-administered medications for this visit.    Patient confirms/reports the following allergies:  Allergies  Allergen Reactions  . Latex Other (See Comments) and Itching  . Sulfa Antibiotics Rash     [Rash] rash - paper tape OK  . Tape Rash    [Rash] rash - paper tape OK    No orders of the defined types were placed in this encounter.   AUTHORIZATION INFORMATION Primary Insurance: 1D#: Group #:  Secondary Insurance: 1D#: Group #:  SCHEDULE INFORMATION: Date: 09/19/20 Time: Location:[facility]

## 2020-09-01 ENCOUNTER — Encounter: Payer: Self-pay | Admitting: Family Medicine

## 2020-09-13 ENCOUNTER — Ambulatory Visit (INDEPENDENT_AMBULATORY_CARE_PROVIDER_SITE_OTHER): Payer: 59 | Admitting: Advanced Practice Midwife

## 2020-09-13 ENCOUNTER — Encounter: Payer: Self-pay | Admitting: Advanced Practice Midwife

## 2020-09-13 ENCOUNTER — Other Ambulatory Visit: Payer: Self-pay

## 2020-09-13 VITALS — BP 123/68 | HR 82 | Ht 62.0 in | Wt 248.4 lb

## 2020-09-13 DIAGNOSIS — Z90710 Acquired absence of both cervix and uterus: Secondary | ICD-10-CM | POA: Diagnosis not present

## 2020-09-13 DIAGNOSIS — Z01419 Encounter for gynecological examination (general) (routine) without abnormal findings: Secondary | ICD-10-CM

## 2020-09-13 NOTE — Patient Instructions (Signed)
Cuidados preventivos entre os 40-64 anos, mulheres Preventive Care 40-59 Years Old, Female Cuidados preventivos se referem a escolhas de estilo de vida e consultas com seu mdico capazes de promover a sade e o bem-estar. Isso inclui: Um exame fsico anual. Ele tambm  chamado check-up anual. Consultas regulares ao dentista e ao oftalmologista. Imunizaes. Triagem para certas doenas. Escolhas de estilo de vida saudvel, como: Consumir uma dieta saudvel. Fazer exerccios regularmente. No usar produtos que contenham nicotina ou tabaco. Limitar o uso de lcool. O que posso esperar de minhas consultas preventivas? Exame fsico Seu mdico verificar: Altura e peso. Eles podem ser usados para calcular seu IMC (ndice de massa corporal). O IMC  uma medida que indica se voc est com um peso saudvel. Frequncia cardaca e presso arterial. Temperatura corporal. Pele, para ver se h manchas com alteraes anormais. Aconselhamento Seu mdico tambm poder fazer perguntas sobre: Problemas de sade anteriores. Histrico mdico da famlia. O uso de tabaco, lcool e drogas. O bem-estar emocional. Seu bem-estar em casa e nos relacionamentos. A atividade sexual. Seus hbitos de dieta, exerccios e sono. O trabalho e o ambiente de trabalho. O acesso a armas de fogo. O mtodo anticoncepcional. O ciclo menstrual. O histrico de gravidez. Quais imunizaes eu preciso tomar? Vacinas so, de maneira geral, dadas em vrias idades de acordo com um cronograma. Seu mdico recomendar vacinas a voc com base em sua idade, histrico mdico e estilo de vida ou outros fatores, como viagens ou o local onde voc trabalha.   Quais exames eu preciso fazer? Exames de sangue Nveis de lipdeos e colesterol. Esses nveis podero ser verificados a cada 5 anos ou com maior frequncia caso voc tenha mais de 50 anos de idade. Exame de hepatite C. Exame de hepatite B. Exames preventivos Exame preventivo de  cncer de pulmo. Voc poder fazer esse exame preventivo comeando aos 55 anos se tiver histrico de consumo de 30 unidades mao-ano e fume atualmente ou tenha parado nos ltimos 15 anos. Exame preventivo do cncer colorretal. Todos os adultos, a partir dos 50 at os 75 anos de idade, devem fazer esse exame preventivo. Seu mdico poder recomendar o exame preventivo aos 45 anos, caso voc tenha maior risco. Voc far exames a cada 1-10 anos, dependendo dos resultados e do tipo de exame preventivo. Exame preventivo de diabetes. Isso  feito verificando-se o acar no sangue (glicose) depois de voc no comer por algum tempo (jejum). Isso poder ser feito a cada 1-3 anos. Mamografia. Poder ser feita a cada 1-2 anos. Converse com seu mdico sobre a frequncia com a qual voc dever realizar mamografias regularmente. Isso poder depender de voc ter ou no histrico familiar de cncer de mama. Exame de preveno do cncer baseado em BRCA. Esse exame poder ser realizado caso voc tenha histrico de cncer de mama, de ovrio, das tubas uterinas ou de peritnio. Exame plvico e de Papanicolau. Isso poder ser feito a cada 3 anos a partir dos 21 anos de idade. Comeando aos 30 anos de idade, esses exames podero ser feitos a cada 5 anos caso voc realize um exame de Papanicolau junto com um teste de HPV. Outros exames Exames para DSTs (doenas sexualmente transmissveis), caso voc tenha sido exposto a algum risco. Exame da densidade ssea. Esse exame  feito para verificar a existncia de osteoporose. Voc poder fazer esse exame caso tenha risco elevado de osteoporose. Discuta com seu mdico seus resultados, opes de tratamento e, se necessrio, a necessidade de mais exames. Siga essas instrues   em casa: Alimentos e bebidas Mantenha uma dieta que inclua frutas e legumes frescos, gros integrais, fontes magras de protena e produtos lcteos com baixo teor de gordura. Tome suplementos de vitaminas  e minerais de acordo com as recomendaes do seu mdico. No consuma lcool se: Seu mdico disser para voc no beber. Estiver grvida, puder estar grvida ou planejar engravidar. Se voc consome lcool: Limite seu consumo a 0-1 dose por dia. Observe quanto lcool sua bebida contm. Nos EUA, um drinque equivale a uma lata de cerveja de 12 oz (355 ml), uma taa de vinho de 5 oz (148 ml) ou uma dose de bebida destilada de 1 oz (44 ml).   Estilo de vida Faa o cuidado dirio dos seus dentes e gengivas. Escove os dentes com uma pasta de dente com flor todos os dias de manh e  noite. Passe fio dental todos os dias. Mantenha-se em atividade. Exercite-se por pelo menos 30 minutos em 5 dias da semana ou mais. No use nenhum produto que contenha nicotina ou tabaco, como cigarros tradicionais, cigarros eletrnicos e fumo de mascar. Caso precise de ajuda para parar de fumar, fale com seu mdico. No use drogas. Se voc for sexualmente ativo, faa sexo seguro. Use um preservativo ou outra forma de proteo para prevenir ISTs (infeces sexualmente transmissveis). Se no quiser engravidar, use algum mtodo anticoncepcional. Caso planeje engravidar, faa uma consulta pr-concepcional com seu mdico. Se orientado pelo seu mdico, tome aspirina em dose baixa diariamente a partir dos 50 anos de idade. Encontre maneiras saudveis de lidar com o estresse, por exemplo: Meditao, ioga ou ouvir msica. Escrever um dirio. Conversar com uma pessoa de confiana. Passar tempo com amigos e parentes. Segurana Sempre use o cinto de segurana ao dirigir ou andar em um veculo. No dirija: Caso tenha bebido. No ande em veculos dirigidos por pessoas que beberam. Caso se sinta cansada ou distrada. Enquanto digita mensagens de texto. Use um capacete ou outros equipamentos de proteo durante atividades esportivas. Caso tenha armas de fogo em casa, certifique-se de seguir todos os procedimentos de segurana  referentes a armas. O que vem a seguir? Marque uma consulta com seu mdico uma vez por ano para fazer o check-up anual. Pergunte ao seu mdico com que frequncia seus olhos e dentes devem ser examinados. Mantenha todas as suas vacinas em dia. Estas informaes no se destinam a substituir as recomendaes de seu mdico. No deixe de discutir quaisquer dvidas com seu mdico. Document Revised: 05/03/2020 Document Reviewed: 03/27/2018 Elsevier Patient Education  2021 Elsevier Inc.  

## 2020-09-13 NOTE — Progress Notes (Addendum)
GYNECOLOGY ANNUAL PREVENTATIVE CARE ENCOUNTER NOTE  History:     Stacey Nichols is a 60 y.o. G30P2002 female here for a routine annual gynecologic exam.  Current complaints: none. Patient endorses having a bilateral tubal ligation and subsequent hysterectomy in [place] 25-27 years ago. She does not know if she has a cervix. Divorced 8 years ago, abstinence since that time. Denies abnormal vaginal bleeding, discharge, pelvic pain, problems with intercourse or other gynecologic concerns.    Gynecologic History No LMP recorded. Patient has had a hysterectomy. Contraception: status post hysterectomy Last Pap: Remote.  Last mammogram: 04/13/2020. 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   s/p chemo (Dr Lissa Merlin)   Anemia    hx of   Anxiety    Arthritis    severe   Constipation    Depression    GERD (gastroesophageal reflux disease)    H/O Bell's palsy    H/O hiatal hernia    Headache    occasional   High triglycerides    History of cancer chemotherapy    Leukemia ([place])    Osteopenia 06/08/2017   DEXA 2017 - T -1.8 spine   Pneumonia    hx of    Rheumatoid arthritis ([place])    Sleep apnea    Stroke ([place])    "mini stroke-caused bells palsy for 1 month"    Past Surgical History:  Procedure Laterality Date   ABDOMINAL HYSTERECTOMY  2001   heavy bleeding, precancerous cells, 1 ovary remains   BLEPHAROPLASTY Bilateral 06/2020   in [place]   FACIAL COSMETIC SURGERY  06/2020   in [place]   LAPAROSCOPIC GASTRIC BAND REMOVAL WITH LAPAROSCOPIC GASTRIC SLEEVE RESECTION  04/26/2014   LAPAROSCOPIC GASTRIC BANDING  2000   performed in [place]   TUBAL LIGATION  1996   UPPER GI ENDOSCOPY  N/A 04/26/2014   Procedure: UPPER GI ENDOSCOPY;  Surgeon: Pedro Earls, MD;  Location: [facility];  Service: General;  Laterality: N/A;    Current Outpatient Medications on File Prior to Visit  Medication Sig Dispense Refill   acetaminophen (TYLENOL) 500 MG tablet Take 500 mg by mouth every 6 (six) hours as needed.     aspirin 81 MG tablet Take 81 mg by mouth daily.     b complex vitamins capsule Take 1 capsule by mouth daily. 90 capsule 3   Cholecalciferol (VITAMIN D3) 1.25 MG (50000 UT) TABS Take 1 tablet by mouth once a week. 12 tablet 3   Ibuprofen (ADVIL PO) Take by mouth.     Liraglutide -Weight Management (SAXENDA) 18 MG/3ML SOPN Inject 0.6 mg into the skin daily for 7 days, THEN 1.2 mg daily for 7 days, THEN 1.8 mg daily for 7 days, THEN 2.4 mg daily for 7 days, THEN 3 mg daily. 15 mL 0   METFORMIN HCL PO Take 5 mg by mouth daily before breakfast.     Multiple Vitamins-Minerals (MULTIVITAMIN) tablet Take 1 tablet by mouth daily.  rosuvastatin (CRESTOR) 10 MG tablet Take 1 tablet (10 mg total) by mouth at bedtime. 90 tablet 3   Vitamin D, Ergocalciferol, (DRISDOL) 1.25 MG (50000 UNIT) CAPS capsule TAKE 1 TABLET BY MOUTH ONE TIME PER WEEK     Arnica LIQD by Does not apply route.     No current facility-administered medications on file prior to visit.    Allergies  Allergen Reactions   Latex Other (See Comments) and Itching   Sulfa Antibiotics Rash    [Rash] rash - paper tape OK   Tape Rash    [Rash] rash - paper tape OK    Social History:  reports that she quit smoking about 2 years ago. Her smoking use included cigarettes. She quit after 30.00 years of use. She has never used smokeless tobacco. She reports current alcohol use. She reports that she does not use drugs.  Family History  Problem Relation Age of Onset   Cancer Mother        cervix   Hyperlipidemia Father    Obesity Father    CAD Father        MI and arrhythmia   Heart Problems Father    Cancer Sister         metastatic, ?pancrease primary   Diabetes Maternal Grandfather    Breast cancer Neg Hx     The following portions of the patient's history were reviewed and updated as appropriate: allergies, current medications, past family history, past medical history, past social history, past surgical history and problem list.  Review of Systems Pertinent items noted in HPI and remainder of comprehensive ROS otherwise negative.  Physical Exam:  BP 123/68   Pulse 82   Ht 5\' 2"  (1.575 m)   Wt 248 lb 6.4 oz (112.7 kg)   BMI 45.43 kg/m  CONSTITUTIONAL: Well-developed, well-nourished female in no acute distress.  HENT:  Normocephalic, atraumatic, External right and left ear normal.  EYES: Conjunctivae and EOM are normal. Pupils are equal, round, and reactive to light. No scleral icterus.  NECK: Normal range of motion, supple, no masses.  Normal thyroid.  SKIN: Skin is warm and dry. No rash noted. Not diaphoretic. No erythema. No pallor. MUSCULOSKELETAL: Normal range of motion. No tenderness.  No cyanosis, clubbing, or edema. NEUROLOGIC: Alert and oriented to person, place, and time. Normal reflexes, muscle tone coordination.  PSYCHIATRIC: Normal mood and affect. Normal behavior. Normal judgment and thought content. CARDIOVASCULAR: Normal heart rate noted, regular rhythm RESPIRATORY: Clear to auscultation bilaterally. Effort and breath sounds normal, no problems with respiration noted. BREASTS: Symmetric in size. No masses, tenderness, skin changes, nipple drainage, or lymphadenopathy bilaterally. Performed in the presence of a chaperone. ABDOMEN: Soft, no distention noted.  No tenderness, rebound or guarding.  PELVIC: Normal appearing external genitalia and urethral meatus;Patient unable to tolerate insertion of speculum beyond introitus. Attempt discontinued   Assessment and Plan:    1. Well woman exam with routine gynecological exam - No acute findings - Attempted to visualize cervical os,  patient unable to tolerate insertion of speculum. Discussed second attempt vs ordering pelvic ultrasound to confirm presence of cervix. Patient verbalizes preference for ultrasound first  2. Hx of hysterectomy - US PELVIC COMPLETE WITH TRANSVAGINAL; Future   Routine preventative health maintenance measures emphasized. Please refer to After Visit Summary for other counseling recommendations.     Total visit time: 30 minutes. Greater than 50% of visit spent in counseling and coordination of care  Will schedule for Pap PRN once  pelvic ultrasound results are obtained.  Mallie Snooks, MSN, CNM Certified Nurse Midwife, [employer] for [employer], [facility] 09/13/20 12:52 PM

## 2020-09-15 ENCOUNTER — Other Ambulatory Visit
Admission: RE | Admit: 2020-09-15 | Discharge: 2020-09-15 | Disposition: A | Discharge status: Home / Self Care | Payer: 59 | Source: Ambulatory Visit | Attending: Gastroenterology | Admitting: Gastroenterology

## 2020-09-15 ENCOUNTER — Other Ambulatory Visit: Payer: Self-pay

## 2020-09-15 DIAGNOSIS — Z01812 Encounter for preprocedural laboratory examination: Secondary | ICD-10-CM | POA: Diagnosis present

## 2020-09-15 DIAGNOSIS — Z20822 Contact with and (suspected) exposure to covid-19: Secondary | ICD-10-CM | POA: Insufficient documentation

## 2020-09-15 LAB — SARS CORONAVIRUS 2 (TAT 6-24 HRS): SARS Coronavirus 2: NEGATIVE

## 2020-09-19 ENCOUNTER — Ambulatory Visit: Payer: 59 | Admitting: Anesthesiology

## 2020-09-19 ENCOUNTER — Other Ambulatory Visit: Payer: Self-pay

## 2020-09-19 ENCOUNTER — Encounter
Admission: RE | Disposition: A | Discharge status: Home / Self Care | Payer: Self-pay | Source: Ambulatory Visit | Attending: Gastroenterology

## 2020-09-19 ENCOUNTER — Encounter: Payer: Self-pay | Admitting: Gastroenterology

## 2020-09-19 ENCOUNTER — Ambulatory Visit
Admission: RE | Admit: 2020-09-19 | Discharge: 2020-09-19 | Disposition: A | Discharge status: Home / Self Care | Payer: 59 | Source: Ambulatory Visit | Attending: Gastroenterology | Admitting: Gastroenterology

## 2020-09-19 DIAGNOSIS — Z882 Allergy status to sulfonamides status: Secondary | ICD-10-CM | POA: Diagnosis not present

## 2020-09-19 DIAGNOSIS — K635 Polyp of colon: Secondary | ICD-10-CM

## 2020-09-19 DIAGNOSIS — K640 First degree hemorrhoids: Secondary | ICD-10-CM | POA: Diagnosis not present

## 2020-09-19 DIAGNOSIS — D122 Benign neoplasm of ascending colon: Secondary | ICD-10-CM | POA: Diagnosis not present

## 2020-09-19 DIAGNOSIS — Z91040 Latex allergy status: Secondary | ICD-10-CM | POA: Insufficient documentation

## 2020-09-19 DIAGNOSIS — Z1211 Encounter for screening for malignant neoplasm of colon: Secondary | ICD-10-CM | POA: Diagnosis not present

## 2020-09-19 DIAGNOSIS — Z87891 Personal history of nicotine dependence: Secondary | ICD-10-CM | POA: Insufficient documentation

## 2020-09-19 DIAGNOSIS — Z7984 Long term (current) use of oral hypoglycemic drugs: Secondary | ICD-10-CM | POA: Insufficient documentation

## 2020-09-19 DIAGNOSIS — Z79899 Other long term (current) drug therapy: Secondary | ICD-10-CM | POA: Diagnosis not present

## 2020-09-19 DIAGNOSIS — Z7982 Long term (current) use of aspirin: Secondary | ICD-10-CM | POA: Diagnosis not present

## 2020-09-19 HISTORY — DX: Fatty (change of) liver, not elsewhere classified: K76.0

## 2020-09-19 HISTORY — DX: Morbid (severe) obesity due to excess calories: E66.01

## 2020-09-19 HISTORY — PX: COLONOSCOPY WITH PROPOFOL: SHX5780

## 2020-09-19 HISTORY — DX: Type 2 diabetes mellitus without complications: E11.9

## 2020-09-19 LAB — GLUCOSE, CAPILLARY: Glucose-Capillary: 121 mg/dL — ABNORMAL HIGH (ref 70–99)

## 2020-09-19 SURGERY — COLONOSCOPY WITH PROPOFOL
Anesthesia: General

## 2020-09-19 MED ORDER — SODIUM CHLORIDE 0.9 % IV SOLN: INTRAVENOUS | Status: DC

## 2020-09-19 MED ORDER — LIDOCAINE 2% (20 MG/ML) 5 ML SYRINGE
INTRAMUSCULAR | Status: DC | PRN
  Administered 2020-09-19: 25 mg via INTRAVENOUS

## 2020-09-19 MED ORDER — LIDOCAINE HCL (PF) 2 % IJ SOLN
INTRAMUSCULAR | Status: AC
  Filled 2020-09-19: qty 5

## 2020-09-19 MED ORDER — MIDAZOLAM HCL 2 MG/2ML IJ SOLN
INTRAMUSCULAR | Status: AC
  Filled 2020-09-19: qty 2

## 2020-09-19 MED ORDER — PROPOFOL 500 MG/50ML IV EMUL
INTRAVENOUS | Status: AC
  Filled 2020-09-19: qty 150

## 2020-09-19 MED ORDER — PROPOFOL 500 MG/50ML IV EMUL
INTRAVENOUS | Status: DC | PRN
  Administered 2020-09-19: 120 ug/kg/min via INTRAVENOUS

## 2020-09-19 MED ORDER — PROPOFOL 10 MG/ML IV BOLUS
INTRAVENOUS | Status: DC | PRN
  Administered 2020-09-19: 100 mg via INTRAVENOUS

## 2020-09-19 NOTE — Transfer of Care (Signed)
Immediate Anesthesia Transfer of Care Note  Patient: Stacey Nichols  Procedure(s) Performed: COLONOSCOPY WITH PROPOFOL (N/A )  Patient Location: Endoscopy Unit  Anesthesia Type:General  Level of Consciousness: awake, alert  and oriented  Airway & Oxygen Therapy: Patient Spontanous Breathing  Post-op Assessment: Report given to RN and Post -op Vital signs reviewed and stable  Post vital signs: Reviewed  Last Vitals:  Vitals Value Taken Time  BP 88/56 09/19/20 0822  Temp    Pulse 79 09/19/20 0822  Resp 19 09/19/20 0822  SpO2 96 % 09/19/20 0822    Last Pain:  Vitals:   09/19/20 0822  TempSrc:   PainSc: 0-No pain         Complications: No complications documented.

## 2020-09-19 NOTE — Op Note (Signed)
[facility] Gastroenterology Patient Name: Stacey Nichols Procedure Date: 09/19/2020 8:00 AM MRN: [id number] Account #: [id number] Date of Birth: [birth year] Admit Type: Outpatient Age: 60 Room: [facility] ENDO ROOM 4 Gender: Female Note Status: Finalized Procedure:             Colonoscopy Indications:           Screening for colorectal malignant neoplasm Providers:             Lucilla Lame MD, MD Referring MD:          Ria Bush (Referring MD) Medicines:             Propofol per Anesthesia Complications:         No immediate complications. Procedure:             Pre-Anesthesia Assessment:                        - Prior to the procedure, a History and Physical was                         performed, and patient medications and allergies were                         reviewed. The patient's tolerance of previous                         anesthesia was also reviewed. The risks and benefits                         of the procedure and the sedation options and risks                         were discussed with the patient. All questions were                         answered, and informed consent was obtained. Prior                         Anticoagulants: The patient has taken no previous                         anticoagulant or antiplatelet agents. ASA Grade                         Assessment: II - A patient with mild systemic disease.                         After reviewing the risks and benefits, the patient                         was deemed in satisfactory condition to undergo the                         procedure.                        After obtaining informed consent, the colonoscope was  passed under direct vision. Throughout the procedure,                         the patient's blood pressure, pulse, and oxygen                         saturations were monitored continuously. The                         Colonoscope was introduced through the anus  and                         advanced to the the cecum, identified by appendiceal                         orifice and ileocecal valve. The colonoscopy was                         performed without difficulty. The patient tolerated                         the procedure well. The quality of the bowel                         preparation was excellent. Findings:      The perianal and digital rectal examinations were normal.      A 3 mm polyp was found in the ascending colon. The polyp was sessile.       The polyp was removed with a cold biopsy forceps. Resection and       retrieval were complete.      Non-bleeding internal hemorrhoids were found during retroflexion. The       hemorrhoids were Grade I (internal hemorrhoids that do not prolapse). Impression:            - One 3 mm polyp in the ascending colon, removed with                         a cold biopsy forceps. Resected and retrieved.                        - Non-bleeding internal hemorrhoids. Recommendation:        - Discharge patient to home.                        - Resume previous diet.                        - Continue present medications.                        - Await pathology results.                        - Repeat colonoscopy in 7 years if polyp adenoma and                         10 years if hyperplastic Procedure Code(s):     --- Professional ---  45380, Colonoscopy, flexible; with biopsy, single or                         multiple Diagnosis Code(s):     --- Professional ---                        Z12.11, Encounter for screening for malignant neoplasm                         of colon                        K63.5, Polyp of colon CPT copyright 2019 American Medical Association. All rights reserved. The codes documented in this report are preliminary and upon coder review may  be revised to meet current compliance requirements. Lucilla Lame MD, MD 09/19/2020 8:18:51 AM This report has been signed  electronically. Number of Addenda: 0 Note Initiated On: 09/19/2020 8:00 AM Scope Withdrawal Time: 0 hours 7 minutes 7 seconds  Total Procedure Duration: 0 hours 13 minutes 16 seconds  Estimated Blood Loss:  Estimated blood loss: none.      [facility]

## 2020-09-19 NOTE — Anesthesia Preprocedure Evaluation (Signed)
Anesthesia Evaluation  Patient identified by MRN, date of birth, ID band Patient awake    Reviewed: Allergy & Precautions, NPO status , Patient's Chart, lab work & pertinent test results  History of Anesthesia Complications Negative for: history of anesthetic complications  Airway Mallampati: III       Dental   Pulmonary sleep apnea (not using CPAP) , neg COPD, Not current smoker, former smoker,           Cardiovascular (-) hypertension(-) Past MI and (-) CHF (-) dysrhythmias (-) Valvular Problems/Murmurs     Neuro/Psych neg Seizures Anxiety Depression CVA (? Bell's palsy, facial droop)    GI/Hepatic hiatal hernia, GERD  ,NASH, fatty liver   Endo/Other  neg diabetesHypothyroidism   Renal/GU negative Renal ROS     Musculoskeletal   Abdominal   Peds  Hematology  (+) anemia ,   Anesthesia Other Findings   Reproductive/Obstetrics                            Anesthesia Physical Anesthesia Plan  ASA: III  Anesthesia Plan: General   Post-op Pain Management:    Induction: Intravenous  PONV Risk Score and Plan: 3  Airway Management Planned: Nasal Cannula  Additional Equipment:   Intra-op Plan:   Post-operative Plan:   Informed Consent: I have reviewed the patients History and Physical, chart, labs and discussed the procedure including the risks, benefits and alternatives for the proposed anesthesia with the patient or authorized representative who has indicated his/her understanding and acceptance.       Plan Discussed with:   Anesthesia Plan Comments:         Anesthesia Quick Evaluation

## 2020-09-19 NOTE — H&P (Signed)
Lucilla Lame, MD [facility] [address] [place], [place] [address] Phone: [phone] Fax : [phone]  Primary Care Physician:  Ria Bush, MD Primary Gastroenterologist:  Dr. Allen Norris  Pre-Procedure History & Physical: HPI:  Stacey Nichols is a 60 y.o. female is here for a screening colonoscopy.   Past Medical History:  Diagnosis Date  . 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 Anemia    hx of  . Anxiety   . Arthritis    severe  . Constipation   . Depression   . GERD (gastroesophageal reflux disease)   . H/O Bell's palsy   . H/O hiatal hernia   . Headache    occasional  . High triglycerides   . History of cancer chemotherapy   . Leukemia ([place])   . Osteopenia 06/08/2017   DEXA 2017 - T -1.8 spine  . Pneumonia    hx of   . Rheumatoid arthritis ([place])   . Sleep apnea   . Stroke [facility])    "mini stroke-caused bells palsy for 1 month"    Past Surgical History:  Procedure Laterality Date  . ABDOMINAL HYSTERECTOMY  2001   heavy bleeding, precancerous cells, 1 ovary remains  . BLEPHAROPLASTY Bilateral 06/2020   in [place]  . FACIAL COSMETIC SURGERY  06/2020   in [place]  . LAPAROSCOPIC GASTRIC BAND REMOVAL WITH LAPAROSCOPIC GASTRIC SLEEVE RESECTION  04/26/2014  . LAPAROSCOPIC GASTRIC BANDING  2000   performed in [place]  . TUBAL LIGATION  1996  . UPPER GI ENDOSCOPY N/A 04/26/2014   Procedure: UPPER GI ENDOSCOPY;  Surgeon: Pedro Earls, MD;  Location: [facility];  Service: General;  Laterality: N/A;    Prior to Admission medications   Medication Sig Start Date End Date Taking? Authorizing Provider  acetaminophen (TYLENOL) 500 MG tablet Take 500 mg by mouth every 6 (six) hours as needed.   Yes [provider]  aspirin 81 MG tablet Take 81 mg by mouth daily.   Yes [provider]  Cholecalciferol (VITAMIN D3) 1.25 MG (50000 UT) TABS Take 1 tablet by mouth once a week. 06/28/20  Yes Ria Bush, MD   Arnica LIQD by Does not apply route.    [provider]  b complex vitamins capsule Take 1 capsule by mouth daily. 08/09/20   Ria Bush, MD  Ibuprofen (ADVIL PO) Take by mouth.    [provider]  Liraglutide -Weight Management (SAXENDA) 18 MG/3ML SOPN Inject 0.6 mg into the skin daily for 7 days, THEN 1.2 mg daily for 7 days, THEN 1.8 mg daily for 7 days, THEN 2.4 mg daily for 7 days, THEN 3 mg daily. 08/09/20 10/06/20  Ria Bush, MD  METFORMIN HCL PO Take 5 mg by mouth daily before breakfast.    [provider]  Multiple Vitamins-Minerals (MULTIVITAMIN) tablet Take 1 tablet by mouth daily. 08/09/20   Ria Bush, MD  rosuvastatin (CRESTOR) 10 MG tablet Take 1 tablet (10 mg total) by mouth at bedtime. 03/24/20   Ria Bush, MD  Vitamin D, Ergocalciferol, (DRISDOL) 1.25 MG (50000 UNIT) CAPS capsule TAKE 1 TABLET BY MOUTH ONE TIME PER WEEK 08/23/20   [provider]    Allergies as of 08/25/2020 - Review Complete 08/25/2020  Allergen Reaction Noted  . Latex Other (See Comments) and Itching 01/10/2014  . Sulfa antibiotics Rash 09/28/2006  . Tape Rash 06/20/2015    Family History  Problem Relation Age of Onset  .  Cancer Mother        cervix  . Hyperlipidemia Father   . Obesity Father   . CAD Father        MI and arrhythmia  . Heart Problems Father   . Cancer Sister        metastatic, ?pancrease primary  . Diabetes Maternal Grandfather   . Breast cancer Neg Hx     Social History   Socioeconomic History  . Marital status: Single    Spouse name: Not on file  . Number of children: Not on file  . Years of education: Not on file  . Highest education level: Not on file  Occupational History  . Occupation: stay at home  Tobacco Use  . Smoking status: Former Smoker    Years: 30.00    Types: Cigarettes    Quit date: 03/30/2018    Years since quitting: 2.4  . Smokeless tobacco: Never Used  Vaping Use  . Vaping Use: Never  used  Substance and Sexual Activity  . Alcohol use: Yes    Comment: occasional  . Drug use: No  . Sexual activity: Not on file  Other Topics Concern  . Not on file  Social History Narrative   From [place]   Lives alone    Separated from husband after leukemia    Occ: retired, was Armed forces operational officer    Edu: 2 yrs college   Activity: no regular exercise    Diet: some water, fruits/vegetables daily    Social Determinants of Radio broadcast assistant Strain: Not on file  Food Insecurity: Not on file  Transportation Needs: Not on file  Physical Activity: Not on file  Stress: Not on file  Social Connections: Not on file  Intimate Partner Violence: Not on file    Review of Systems: See HPI, otherwise negative ROS  Physical Exam: There were no vitals taken for this visit. General:   Alert,  pleasant and cooperative in NAD Head:  Normocephalic and atraumatic. Neck:  Supple; no masses or thyromegaly. Lungs:  Clear throughout to auscultation.    Heart:  Regular rate and rhythm. Abdomen:  Soft, nontender and nondistended. Normal bowel sounds, without guarding, and without rebound.   Neurologic:  Alert and  oriented x4;  grossly normal neurologically.  Impression/Plan: Stacey Nichols is now here to undergo a screening colonoscopy.  Risks, benefits, and alternatives regarding colonoscopy have been reviewed with the patient.  Questions have been answered.  All parties agreeable.

## 2020-09-19 NOTE — Anesthesia Postprocedure Evaluation (Signed)
Anesthesia Post Note  Patient: Stacey Nichols  Procedure(s) Performed: COLONOSCOPY WITH PROPOFOL (N/A )  Patient location during evaluation: Endoscopy Anesthesia Type: General Level of consciousness: awake and alert Pain management: pain level controlled Vital Signs Assessment: post-procedure vital signs reviewed and stable Respiratory status: spontaneous breathing and respiratory function stable Cardiovascular status: stable Anesthetic complications: no   No complications documented.   Last Vitals:  Vitals:   09/19/20 0832 09/19/20 0842  BP: 103/86 120/84  Pulse: 72 75  Resp: 17 (!) 26  Temp:    SpO2: 96% 95%    Last Pain:  Vitals:   09/19/20 0842  TempSrc:   PainSc: 0-No pain                 Shayana Hornstein K

## 2020-09-20 ENCOUNTER — Encounter: Payer: Self-pay | Admitting: Gastroenterology

## 2020-09-20 ENCOUNTER — Encounter: Payer: Self-pay | Admitting: Family Medicine

## 2020-09-20 ENCOUNTER — Ambulatory Visit (INDEPENDENT_AMBULATORY_CARE_PROVIDER_SITE_OTHER): Payer: 59 | Admitting: Family Medicine

## 2020-09-20 ENCOUNTER — Telehealth: Payer: Self-pay

## 2020-09-20 VITALS — BP 120/68 | HR 72 | Temp 97.6°F | Ht 62.0 in | Wt 250.2 lb

## 2020-09-20 DIAGNOSIS — Z9884 Bariatric surgery status: Secondary | ICD-10-CM

## 2020-09-20 DIAGNOSIS — K635 Polyp of colon: Secondary | ICD-10-CM | POA: Diagnosis not present

## 2020-09-20 DIAGNOSIS — R7303 Prediabetes: Secondary | ICD-10-CM | POA: Diagnosis not present

## 2020-09-20 DIAGNOSIS — Z6841 Body Mass Index (BMI) 40.0 and over, adult: Secondary | ICD-10-CM

## 2020-09-20 LAB — POCT GLYCOSYLATED HEMOGLOBIN (HGB A1C): Hemoglobin A1C: 6.1 % — AB (ref 4.0–5.6)

## 2020-09-20 LAB — SURGICAL PATHOLOGY

## 2020-09-20 MED ORDER — CONTRAVE 8-90 MG PO TB12: ORAL_TABLET | ORAL | 0 refills | Status: DC

## 2020-09-20 MED ORDER — METFORMIN HCL 500 MG PO TABS: 500.0000 mg | ORAL_TABLET | Freq: Every day | ORAL | 3 refills | Status: DC

## 2020-09-20 MED ORDER — SAXENDA 18 MG/3ML ~~LOC~~ SOPN: PEN_INJECTOR | SUBCUTANEOUS | 0 refills | Status: DC

## 2020-09-20 NOTE — Patient Instructions (Signed)
A1c today  Comienze metformina 500mg  diarios.  Revise precio para saxenda de nuevo con descuento (a traves del website) o contrave que he mandado a Engineer, building services.

## 2020-09-20 NOTE — Progress Notes (Signed)
Patient ID: Stacey Nichols, female    DOB: [birth year], 60 y.o.   MRN: [id number]  This visit was conducted in person.  BP 120/68   Pulse 72   Temp 97.6 F (36.4 C) (Temporal)   Ht 5\' 2"  (1.575 m)   Wt 250 lb 3 oz (113.5 kg)   SpO2 96%   BMI 45.76 kg/m    CC: 6 wk f/u visit obesity  Subjective:   HPI: Stacey Nichols is a 60 y.o. female presenting on 09/20/2020 for Weight Management (Here for 6 wk f/u.)   Starting weight: 247 lbs Last weight: 249 lbs Today's weight 250 lbs  Saxenda previously unaffordable - and not covered by insurance. She states she needs PA. States she doesn't qualify for discount coupon.   24 hour recall: Not done (see prior note)  Activity regimen: No regular exercise - activity limited by back and knee pains.   S/p gastric band surgery ~2000 "swedish band" then sleeve gastrectomy 04/2014.  Requests GYN referral.   Still needs to check on nutritionist insurance coverage.   S/p colonoscopy yesterday - 1 polyp pathology pending Well woman with OBGYN last week     Relevant past medical, surgical, family and social history reviewed and updated as indicated. Interim medical history since our last visit reviewed. Allergies and medications reviewed and updated. Outpatient Medications Prior to Visit  Medication Sig Dispense Refill  . acetaminophen (TYLENOL) 500 MG tablet Take 500 mg by mouth every 6 (six) hours as needed.    . Arnica LIQD by Does not apply route.    Marland Kitchen aspirin 81 MG tablet Take 81 mg by mouth daily.    Marland Kitchen b complex vitamins capsule Take 1 capsule by mouth daily. 90 capsule 3  . Cholecalciferol (VITAMIN D3) 1.25 MG (50000 UT) TABS Take 1 tablet by mouth once a week. 12 tablet 3  . Ibuprofen (ADVIL PO) Take by mouth.    . Multiple Vitamin (MULTIVITAMIN) tablet Take 1 tablet by mouth daily.    . rosuvastatin (CRESTOR) 10 MG tablet Take 1 tablet (10 mg total) by mouth at bedtime. 90 tablet 3  . Vitamin D, Ergocalciferol, (DRISDOL) 1.25 MG  (50000 UNIT) CAPS capsule TAKE 1 TABLET BY MOUTH ONE TIME PER WEEK    . METFORMIN HCL PO Take 5 mg by mouth daily before breakfast.    . Liraglutide -Weight Management (SAXENDA) 18 MG/3ML SOPN Inject 0.6 mg into the skin daily for 7 days, THEN 1.2 mg daily for 7 days, THEN 1.8 mg daily for 7 days, THEN 2.4 mg daily for 7 days, THEN 3 mg daily. 15 mL 0  . Multiple Vitamins-Minerals (MULTIVITAMIN) tablet Take 1 tablet by mouth daily.     No facility-administered medications prior to visit.     Per HPI unless specifically indicated in ROS section below Review of Systems Objective:  BP 120/68   Pulse 72   Temp 97.6 F (36.4 C) (Temporal)   Ht 5\' 2"  (1.575 m)   Wt 250 lb 3 oz (113.5 kg)   SpO2 96%   BMI 45.76 kg/m   Wt Readings from Last 3 Encounters:  09/20/20 250 lb 3 oz (113.5 kg)  09/19/20 245 lb (111.1 kg)  09/13/20 248 lb 6.4 oz (112.7 kg)      Physical Exam Vitals and nursing note reviewed.  Constitutional:      Appearance: Normal appearance. She is obese. She is not ill-appearing.  Cardiovascular:     Rate and  Rhythm: Normal rate and regular rhythm.     Pulses: Normal pulses.     Heart sounds: Normal heart sounds. No murmur heard.   Pulmonary:     Effort: Pulmonary effort is normal. No respiratory distress.     Breath sounds: Normal breath sounds. No wheezing, rhonchi or rales.  Neurological:     Mental Status: She is alert.  Psychiatric:        Mood and Affect: Mood normal.        Behavior: Behavior normal.       Results for orders placed or performed in visit on 09/20/20  POCT glycosylated hemoglobin (Hb A1C)  Result Value Ref Range   Hemoglobin A1C 6.1 (A) 4.0 - 5.6 %   HbA1c POC (<> result, manual entry)     HbA1c, POC (prediabetic range)     HbA1c, POC (controlled diabetic range)     Assessment & Plan:  This visit occurred during the SARS-CoV-2 public health emergency.  Safety protocols were in place, including screening questions prior to the visit,  additional usage of staff PPE, and extensive cleaning of exam room while observing appropriate contact time as indicated for disinfecting solutions.   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  Saxenda unaffordable. States she called insurance who stated saxenda would be covered if PA completed. Will submit PA for saxenda. Will also submit contrave for pt to price out vs saxenda and start one or the other. RTC 1 mo after starting bariatric medication for f/u visit.       Relevant Medications   metFORMIN (GLUCOPHAGE) 500 MG tablet   Naltrexone-buPROPion HCl ER (CONTRAVE) 8-90 MG TB12   Liraglutide -Weight Management (SAXENDA) 18 MG/3ML SOPN   Prediabetes - Primary    Update POC A1c today  Has been off metformin. Desires to restart - sent in.       Relevant Orders   POCT glycosylated hemoglobin (Hb A1C) (Completed)   Polyp of ascending colon    Reviewed latest colonoscopy. Pathology report pending.          Meds ordered this encounter  Medications  . metFORMIN (GLUCOPHAGE) 500 MG tablet    Sig: Take 1 tablet (500 mg total) by mouth daily with breakfast.    Dispense:  90 tablet    Refill:  3  . Naltrexone-buPROPion HCl ER (CONTRAVE) 8-90 MG TB12    Sig: Start 1 tablet every morning for 7 days, then 1 tablet twice daily for 7 days, then 2 tablets every morning and one every evening    Dispense:  120 tablet    Refill:  0  . Liraglutide -Weight Management (SAXENDA) 18 MG/3ML SOPN    Sig: Inject 0.6 mg into the skin daily for 7 days, THEN 1.2 mg daily for 7 days, THEN 1.8 mg daily for 7 days, THEN 2.4 mg daily for 7 days, THEN 3 mg daily.    Dispense:  15 mL    Refill:  0    To price out vs contrave   Orders Placed This Encounter  Procedures  . POCT glycosylated hemoglobin (Hb A1C)    Patient Instructions  A1c today   Comienze metformina 500mg  diarios.  Revise precio para saxenda de nuevo con descuento (a traves del website) o contrave que he mandado a Engineer, building services.    Follow up plan:  Return if symptoms worsen or fail to improve.  Ria Bush, MD

## 2020-09-20 NOTE — Assessment & Plan Note (Signed)
Needs to check on insurance coverage for nutritionist.  Saxenda unaffordable. States she called insurance who stated saxenda would be covered if PA completed. Will submit PA for saxenda. Will also submit contrave for pt to price out vs saxenda and start one or the other. RTC 1 mo after starting bariatric medication for f/u visit.

## 2020-09-20 NOTE — Assessment & Plan Note (Addendum)
Update POC A1c today  Has been off metformin. Desires to restart - sent in.

## 2020-09-20 NOTE — Assessment & Plan Note (Signed)
Reviewed latest colonoscopy. Pathology report pending.

## 2020-09-20 NOTE — Telephone Encounter (Addendum)
Apparently insurance told her if I wrote letter she could get med at affordable price. I don't see how I can do this.  She will look into discount card.

## 2020-09-20 NOTE — Telephone Encounter (Signed)
Submitted PA via CoverMyMeds for Saxenda 18 mg/3 ml; key:  [id number].  Received a message stating, "Member should be able to get the drug/product without a PA at this time."  Spoke with [facility] about PA.  Says the claim goes through but pt's cost is $1400.  FYI to Dr. Darnell Level.

## 2020-09-22 ENCOUNTER — Ambulatory Visit: Payer: 59

## 2020-09-25 ENCOUNTER — Ambulatory Visit
Admission: RE | Admit: 2020-09-25 | Discharge: 2020-09-25 | Disposition: A | Discharge status: Home / Self Care | Payer: 59 | Source: Ambulatory Visit | Attending: Advanced Practice Midwife | Admitting: Advanced Practice Midwife

## 2020-09-25 ENCOUNTER — Ambulatory Visit: Admission: RE | Admit: 2020-09-25 | Payer: 59 | Source: Ambulatory Visit

## 2020-09-25 DIAGNOSIS — Z90710 Acquired absence of both cervix and uterus: Secondary | ICD-10-CM | POA: Insufficient documentation

## 2020-09-26 ENCOUNTER — Encounter: Payer: Self-pay | Admitting: Family Medicine

## 2020-10-02 MED ORDER — CONTRAVE 8-90 MG PO TB12: ORAL_TABLET | ORAL | 0 refills | Status: DC

## 2020-10-02 NOTE — Telephone Encounter (Signed)
Saxenda unaffordable. Please fill out PA for contrave Rx for patient.  Patient states insurance gave her # to call to get approval [phone]

## 2020-10-06 NOTE — Telephone Encounter (Signed)
PA for Contrave in progress.

## 2020-10-09 NOTE — Telephone Encounter (Signed)
Submitted PA for Contrave 8-90 mg; key:  [id number], PA case ID:  [id number].  Decision pending.

## 2020-10-10 NOTE — Telephone Encounter (Signed)
Received faxed PA approval, valid 10/09/2020- 11/08/2020.

## 2020-11-20 ENCOUNTER — Other Ambulatory Visit: Payer: Self-pay | Admitting: Family Medicine

## 2020-11-20 NOTE — Telephone Encounter (Signed)
Refill request Contrave Last refill 10/02/20 #120 Last office visit 09/20/20

## 2020-11-21 NOTE — Telephone Encounter (Signed)
Spoke to patient by telephone and was advised that the medication makes her feel a little nervous. Patient stated that she does want to talk to Dr. Danise Mina about the medication. Appointment scheduled for 11/24/20 at 2:30 pm.

## 2020-11-21 NOTE — Telephone Encounter (Signed)
plz get update on how she's doing on this medicine and schedule f/u OV

## 2020-11-24 ENCOUNTER — Ambulatory Visit (INDEPENDENT_AMBULATORY_CARE_PROVIDER_SITE_OTHER): Payer: 59 | Admitting: Family Medicine

## 2020-11-24 ENCOUNTER — Encounter: Payer: Self-pay | Admitting: Family Medicine

## 2020-11-24 ENCOUNTER — Other Ambulatory Visit: Payer: Self-pay

## 2020-11-24 ENCOUNTER — Telehealth: Payer: Self-pay | Admitting: Family Medicine

## 2020-11-24 VITALS — BP 122/80 | HR 85 | Temp 97.6°F | Ht 62.0 in | Wt 254.0 lb

## 2020-11-24 DIAGNOSIS — Z6841 Body Mass Index (BMI) 40.0 and over, adult: Secondary | ICD-10-CM

## 2020-11-24 DIAGNOSIS — Z9884 Bariatric surgery status: Secondary | ICD-10-CM | POA: Diagnosis not present

## 2020-11-24 MED ORDER — WEGOVY 0.25 MG/0.5ML ~~LOC~~ SOAJ: 0.2500 mg | SUBCUTANEOUS | 0 refills | Status: DC

## 2020-11-24 NOTE — Telephone Encounter (Signed)
Pt seen in office today, was inquiring about f/u plan regarding well woman exam. Will forward to GYN.

## 2020-11-24 NOTE — Progress Notes (Signed)
Patient ID: Stacey Nichols, female    DOB: October 21, 1960, 61 y.o.   MRN: [id number]  This visit was conducted in person.  BP 122/80   Pulse 85   Temp 97.6 F (36.4 C) (Temporal)   Ht 5\' 2"  (1.575 m)   Wt 254 lb (115.2 kg)   SpO2 95%   BMI 46.46 kg/m    CC: 1 mo f/u visit  Subjective:   HPI: Stacey Nichols is a 60 y.o. female presenting on 11/24/2020 for Weight Management (Here for 1 mo f/u.  Thinks Contrave is causing insomnia. )   Starting weight: 247 lbs Last weight: 250 lbs Today's weight 254 lbs  Saxenda unaffordable ($250)/not covered by insurance.  Last visit we started contrave - PA approved 10/09/2020.  She's been taking tapering dose - when she increased to 3 pills daily, noted palpitations and increased anxiety so she dropped down to twice daily. Notes difficulty falling asleep.   S/p gastric band surgery ~2000 "swedish band" then sleeve gastrectomy 04/2014.   24 hour recall: Done 08/09/2020  Activity regimen: Started walking routine - up to 37min/day. She is also riding stationary bicycle 3x/wk. Some body aches with this.   Asks about f/u plan for pap smear.      Relevant past medical, surgical, family and social history reviewed and updated as indicated. Interim medical history since our last visit reviewed. Allergies and medications reviewed and updated. Outpatient Medications Prior to Visit  Medication Sig Dispense Refill  . acetaminophen (TYLENOL) 500 MG tablet Take 500 mg by mouth every 6 (six) hours as needed.    Marland Kitchen aspirin 81 MG tablet Take 81 mg by mouth daily.    Marland Kitchen b complex vitamins capsule Take 1 capsule by mouth daily. 90 capsule 3  . Ibuprofen (ADVIL PO) Take by mouth.    . metFORMIN (GLUCOPHAGE) 500 MG tablet Take 1 tablet (500 mg total) by mouth daily with breakfast. 90 tablet 3  . Multiple Vitamin (MULTIVITAMIN) tablet Take 1 tablet by mouth daily.    . rosuvastatin (CRESTOR) 10 MG tablet Take 1 tablet (10 mg total) by mouth at bedtime. 90  tablet 3  . Vitamin D, Ergocalciferol, (DRISDOL) 1.25 MG (50000 UNIT) CAPS capsule TAKE 1 TABLET BY MOUTH ONE TIME PER WEEK    . CONTRAVE 8-90 MG TB12 START 1 TABLET EVERY MORNING FOR 7 DAYS, THEN 1 TABLET TWICE DAILY FOR 7 DAYS, THEN 2 TABLETS EVERY MORNING AND ONE EVERY EVENING 120 tablet 0  . XIIDRA 5 % SOLN Apply 1 drop to eye 2 (two) times daily.    . Arnica LIQD by Does not apply route.    . Cholecalciferol (VITAMIN D3) 1.25 MG (50000 UT) TABS Take 1 tablet by mouth once a week. 12 tablet 3   No facility-administered medications prior to visit.     Per HPI unless specifically indicated in ROS section below Review of Systems Objective:  BP 122/80   Pulse 85   Temp 97.6 F (36.4 C) (Temporal)   Ht 5\' 2"  (1.575 m)   Wt 254 lb (115.2 kg)   SpO2 95%   BMI 46.46 kg/m   Wt Readings from Last 3 Encounters:  11/24/20 254 lb (115.2 kg)  09/20/20 250 lb 3 oz (113.5 kg)  09/19/20 245 lb (111.1 kg)      Physical Exam Vitals and nursing note reviewed.  Constitutional:      Appearance: Normal appearance. She is obese. She is not ill-appearing.  Cardiovascular:  Rate and Rhythm: Normal rate and regular rhythm.     Pulses: Normal pulses.     Heart sounds: Normal heart sounds. No murmur heard.   Pulmonary:     Effort: Pulmonary effort is normal. No respiratory distress.     Breath sounds: Normal breath sounds. No wheezing, rhonchi or rales.  Musculoskeletal:     Right lower leg: No edema.     Left lower leg: No edema.  Skin:    General: Skin is warm and dry.  Neurological:     Mental Status: She is alert.  Psychiatric:        Mood and Affect: Mood normal.        Behavior: Behavior normal.       Results for orders placed or performed in visit on 09/20/20  POCT glycosylated hemoglobin (Hb A1C)  Result Value Ref Range   Hemoglobin A1C 6.1 (A) 4.0 - 5.6 %   HbA1c POC (<> result, manual entry)     HbA1c, POC (prediabetic range)     HbA1c, POC (controlled diabetic range)      Assessment & Plan:  This visit occurred during the SARS-CoV-2 public health emergency.  Safety protocols were in place, including screening questions prior to the visit, additional usage of staff PPE, and extensive cleaning of exam room while observing appropriate contact time as indicated for disinfecting solutions.   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 Advised stop this. Will send in wegovy to see if insurance will cover this given contrave intolerance. RTC 4 wks after starting wegovy for f/u weight management.  Will need to titrate slowly in prior bariatric surgery history.       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 Semaglutide-Weight Management (WEGOVY) 0.25 MG/0.5ML SOAJ    Sig: Inject 0.25 mg into the skin once a week.    Dispense:  2 mL    Refill:  0    Did not tolerate Contrave   No orders of the defined types were placed in this encounter.   Patient Instructions  Stop Contrave - due to difficulty tolerating.  Start Wegovy weekly injection.  Return in 4-6 weeks for follow up visit.    Follow up plan: Return if symptoms worsen or fail to improve.  Ria Bush, MD

## 2020-11-24 NOTE — Patient Instructions (Addendum)
Stop Contrave - due to difficulty tolerating.  Start Wegovy weekly injection.  Return in 4-6 weeks for follow up visit.

## 2020-11-24 NOTE — Assessment & Plan Note (Addendum)
Did not tolerate contrave due to restless nervousness and insomnia. Advised stop this. Will send in wegovy to see if insurance will cover this given contrave intolerance. RTC 4 wks after starting wegovy for f/u weight management.  Will need to titrate slowly in prior bariatric surgery history.

## 2020-11-27 ENCOUNTER — Telehealth: Payer: Self-pay | Admitting: Radiology

## 2020-11-27 NOTE — Telephone Encounter (Signed)
Left message for patient to call [facility] to schedule PAP with MD per request of Mallie Snooks, CNM

## 2021-01-08 ENCOUNTER — Encounter: Payer: Self-pay | Admitting: Family Medicine

## 2021-01-08 ENCOUNTER — Other Ambulatory Visit: Payer: Self-pay

## 2021-01-08 ENCOUNTER — Ambulatory Visit (INDEPENDENT_AMBULATORY_CARE_PROVIDER_SITE_OTHER): Payer: 59 | Admitting: Family Medicine

## 2021-01-08 DIAGNOSIS — M79671 Pain in right foot: Secondary | ICD-10-CM | POA: Diagnosis not present

## 2021-01-08 DIAGNOSIS — E559 Vitamin D deficiency, unspecified: Secondary | ICD-10-CM | POA: Diagnosis not present

## 2021-01-08 DIAGNOSIS — Z6841 Body Mass Index (BMI) 40.0 and over, adult: Secondary | ICD-10-CM | POA: Diagnosis not present

## 2021-01-08 DIAGNOSIS — Z9884 Bariatric surgery status: Secondary | ICD-10-CM | POA: Diagnosis not present

## 2021-01-08 DIAGNOSIS — G8929 Other chronic pain: Secondary | ICD-10-CM

## 2021-01-08 DIAGNOSIS — M545 Low back pain, unspecified: Secondary | ICD-10-CM

## 2021-01-08 LAB — BASIC METABOLIC PANEL
BUN: 32 mg/dL — ABNORMAL HIGH (ref 6–23)
CO2: 26 mEq/L (ref 19–32)
Calcium: 9.6 mg/dL (ref 8.4–10.5)
Chloride: 105 mEq/L (ref 96–112)
Creatinine, Ser: 0.9 mg/dL (ref 0.40–1.20)
GFR: 70.23 mL/min (ref >60.00)
Glucose, Bld: 101 mg/dL — ABNORMAL HIGH (ref 70–99)
Potassium: 4.1 mEq/L (ref 3.5–5.1)
Sodium: 140 mEq/L (ref 135–145)

## 2021-01-08 LAB — CBC WITH DIFFERENTIAL/PLATELET
Basophils Absolute: 0 10*3/uL (ref 0.0–0.1)
Basophils Relative: 0.5 % (ref 0.0–3.0)
Eosinophils Absolute: 0.4 10*3/uL (ref 0.0–0.7)
Eosinophils Relative: 5.1 % — ABNORMAL HIGH (ref 0.0–5.0)
HCT: 45.9 % (ref 36.0–46.0)
Hemoglobin: 15.1 g/dL — ABNORMAL HIGH (ref 12.0–15.0)
Lymphocytes Relative: 30.8 % (ref 12.0–46.0)
Lymphs Abs: 2.3 10*3/uL (ref 0.7–4.0)
MCHC: 32.8 g/dL (ref 30.0–36.0)
MCV: 98.5 fl (ref 78.0–100.0)
Monocytes Absolute: 0.9 10*3/uL (ref 0.1–1.0)
Monocytes Relative: 11.8 % (ref 3.0–12.0)
Neutro Abs: 3.8 10*3/uL (ref 1.4–7.7)
Neutrophils Relative %: 51.8 % (ref 43.0–77.0)
Platelets: 160 10*3/uL (ref 150.0–400.0)
RBC: 4.66 Mil/uL (ref 3.87–5.11)
RDW: 14.5 % (ref 11.5–15.5)
WBC: 7.4 10*3/uL (ref 4.0–10.5)

## 2021-01-08 LAB — VITAMIN D 25 HYDROXY (VIT D DEFICIENCY, FRACTURES): VITD: 46.77 ng/mL (ref 30.00–100.00)

## 2021-01-08 MED ORDER — NAPROXEN 500 MG PO TABS: ORAL_TABLET | ORAL | 0 refills | Status: DC

## 2021-01-08 NOTE — Assessment & Plan Note (Addendum)
Has not yet been able to fill Wegovy. I asked her to contact pharmacy to see reason for this.  She has implemented healthy diet and lifestyle changes with resultant 9 lb weight loss in the past month however - congratulated.  RTC 1 mo after starting Wegovy.  Reviewed risks of wegovy in setting of prior bypass.

## 2021-01-08 NOTE — Assessment & Plan Note (Signed)
Update levels on vit D weekly replacement.

## 2021-01-08 NOTE — Progress Notes (Signed)
Patient ID: Stacey Nichols, female    DOB: [birth year], 60 y.o.   MRN: [id number]  This visit was conducted in person.  BP 112/74   Pulse 76   Temp 97.7 F (36.5 C) (Temporal)   Ht 5\' 2"  (1.575 m)   Wt 245 lb (111.1 kg)   SpO2 94%   BMI 44.81 kg/m    CC: weight management f/u visit  Subjective:   HPI: Stacey Nichols is a 60 y.o. female presenting on 01/08/2021 for weight management (6 week f/u/)   Starting weight: 247 lbs Last weight: 254 lbs Today's weight 245 lbs  Saxenda unaffordable ($250)/not covered by insurance. Contrave - noted palpitations and increased anxiety with taper, unable to tolerate higher than twice daily, a dose which was ineffective.   Wegovy prescribed 11/2020 at 0.25mg  weekly dose - hasn't been started yet. Having trouble with insurance coverage.   S/p gastric band surgery ~2000 "swedish band" then sleeve gastrectomy 04/2014.   24 hour recall: Trying protein based diet through the internet. 3 meals, 2 milk shakes every day. Eats Ezequiel bread in the morning. Low fat, low carb.   Activity regimen: Started regular walking routine 35 min. Ongoing body aches (hips, back, arms, shoulders, etc) due to this as well as R heel pain - in h/o plantar fasciitis. Requests NSAID for this.   Requests labwork today - new bruise developed to R forearm which did heal - also noticed L leg bruise. Known h/o AML in remission.  Notes significant dry eyes, worse in the mornings, uses lubricating eye drops regularly as well as new nasal spray Tyrvaya. Continues xiidra eye drops. Chronic dry mouth.      Relevant past medical, surgical, family and social history reviewed and updated as indicated. Interim medical history since our last visit reviewed. Allergies and medications reviewed and updated. Outpatient Medications Prior to Visit  Medication Sig Dispense Refill   Varenicline Tartrate (TYRVAYA) 0.03 MG/ACT SOLN Place into the nose.     acetaminophen (TYLENOL) 500 MG  tablet Take 500 mg by mouth every 6 (six) hours as needed.     aspirin 81 MG tablet Take 81 mg by mouth daily.     b complex vitamins capsule Take 1 capsule by mouth daily. 90 capsule 3   Ibuprofen (ADVIL PO) Take by mouth.     metFORMIN (GLUCOPHAGE) 500 MG tablet Take 1 tablet (500 mg total) by mouth daily with breakfast. 90 tablet 3   Multiple Vitamin (MULTIVITAMIN) tablet Take 1 tablet by mouth daily.     rosuvastatin (CRESTOR) 10 MG tablet Take 1 tablet (10 mg total) by mouth at bedtime. 90 tablet 3   Semaglutide-Weight Management (WEGOVY) 0.25 MG/0.5ML SOAJ Inject 0.25 mg into the skin once a week. 2 mL 0   Vitamin D, Ergocalciferol, (DRISDOL) 1.25 MG (50000 UNIT) CAPS capsule TAKE 1 TABLET BY MOUTH ONE TIME PER WEEK     XIIDRA 5 % SOLN Apply 1 drop to eye 2 (two) times daily.     No facility-administered medications prior to visit.     Per HPI unless specifically indicated in ROS section below Review of Systems  Objective:  BP 112/74   Pulse 76   Temp 97.7 F (36.5 C) (Temporal)   Ht 5\' 2"  (1.575 m)   Wt 245 lb (111.1 kg)   SpO2 94%   BMI 44.81 kg/m   Wt Readings from Last 3 Encounters:  01/08/21 245 lb (111.1 kg)  11/24/20 254 lb (  115.2 kg)  09/20/20 250 lb 3 oz (113.5 kg)      Physical Exam Vitals and nursing note reviewed.  Constitutional:      Appearance: Normal appearance. She is obese. She is not ill-appearing.  Eyes:     Extraocular Movements: Extraocular movements intact.     Conjunctiva/sclera: Conjunctivae normal.     Pupils: Pupils are equal, round, and reactive to light.  Cardiovascular:     Rate and Rhythm: Normal rate and regular rhythm.     Pulses: Normal pulses.     Heart sounds: Normal heart sounds. No murmur heard. Pulmonary:     Effort: Pulmonary effort is normal. No respiratory distress.     Breath sounds: Normal breath sounds. No wheezing or rhonchi.  Musculoskeletal:        General: Tenderness present.     Cervical back: Normal range of  motion and neck supple. No rigidity.     Right lower leg: No edema.     Left lower leg: No edema.     Comments:  1+ DP bilat R foot tender to palpation at 2nd MTPJ  No pain at malleoli, at base of 5th MT, or at navicular. No pain at heel, no pain with calc squeeze test.   Lymphadenopathy:     Cervical: No cervical adenopathy.  Skin:    General: Skin is warm and dry.     Findings: No rash.  Neurological:     Mental Status: She is alert.  Psychiatric:        Mood and Affect: Mood normal.        Behavior: Behavior normal.      Results for orders placed or performed in visit on 09/20/20  POCT glycosylated hemoglobin (Hb A1C)  Result Value Ref Range   Hemoglobin A1C 6.1 (A) 4.0 - 5.6 %   HbA1c POC (<> result, manual entry)     HbA1c, POC (prediabetic range)     HbA1c, POC (controlled diabetic range)      Assessment & Plan:  This visit occurred during the SARS-CoV-2 public health emergency.  Safety protocols were in place, including screening questions prior to the visit, additional usage of staff PPE, and extensive cleaning of exam room while observing appropriate contact time as indicated for disinfecting solutions.   Problem List Items Addressed This Visit     Morbid obesity with BMI of 45.0-49.9, adult ([place]) - Primary    Has not yet been able to fill [facility]. I asked her to contact pharmacy to see reason for this.  She has implemented healthy diet and lifestyle changes with resultant 9 lb weight loss in the past month however - congratulated.  RTC 1 mo after starting Wegovy.  Reviewed risks of wegovy in setting of prior bypass.        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        Relevant Orders   VITAMIN D 25 Hydroxy (Vit-D Deficiency, Fractures)   Low back pain    Diffuse polyarthralgias associated with hip  pain - Rx naprosyn with tylenol and topical voltaren. Update with effect.        Relevant Medications   naproxen (NAPROSYN) 500 MG tablet   Right foot pain    Diffuse pain intially, localizes to R 2nd MTPJ ?capsulitis. Trial oral NSAID + tylenol +  voltaren topically          Meds ordered this encounter  Medications   naproxen (NAPROSYN) 500 MG tablet    Sig: Take one po bid x 1 week then prn pain, take with food    Dispense:  60 tablet    Refill:  0   Orders Placed This Encounter  Procedures   Basic metabolic panel   CBC with Differential/Platelet   VITAMIN D 25 Hydroxy (Vit-D Deficiency, Fractures)     Patient Instructions  Dejenos saber lo que encuentra sobre Wegovy inyeccion.  Puede tratar crema voltaren para articulaciones afectadas. Si no mejora, puede tomar naprosyn 500mg  al dia - pero cuidado con dolor de estomago. Puede usar con tylenol 500mg .  Laboratorios hoy.   Follow up plan: No follow-ups on file.  Ria Bush, MD

## 2021-01-08 NOTE — Assessment & Plan Note (Signed)
Diffuse pain intially, localizes to R 2nd MTPJ ?capsulitis. Trial oral NSAID + tylenol + voltaren topically

## 2021-01-08 NOTE — Assessment & Plan Note (Addendum)
Diffuse polyarthralgias associated with hip pain - Rx naprosyn with tylenol and topical voltaren. Update with effect.

## 2021-01-08 NOTE — Patient Instructions (Addendum)
Dejenos saber lo que encuentra sobre Wegovy inyeccion.  Puede tratar crema voltaren para articulaciones afectadas. Si no mejora, puede tomar naprosyn 500mg  al dia - pero cuidado con dolor de estomago. Puede usar con tylenol 500mg .  Laboratorios hoy.

## 2021-01-16 ENCOUNTER — Telehealth: Payer: Self-pay | Admitting: Radiology

## 2021-01-16 NOTE — Telephone Encounter (Signed)
Left message to change appointment from 01/17/21 with Stacey Nichols to an appointment date with MD.

## 2021-01-17 ENCOUNTER — Ambulatory Visit: Payer: 59 | Admitting: Advanced Practice Midwife

## 2021-01-18 ENCOUNTER — Encounter: Payer: Self-pay | Admitting: Family Medicine

## 2021-01-23 MED ORDER — SAXENDA 18 MG/3ML ~~LOC~~ SOPN: PEN_INJECTOR | SUBCUTANEOUS | 0 refills | Status: DC

## 2021-01-23 NOTE — Telephone Encounter (Signed)
[facility] shortage likely until end of 2022.  Please fill out PA for saxenda - which I have sent to her pharmacy.  I also asked her to check other local pharmacies to see if they have wegovy in stock.

## 2021-01-30 ENCOUNTER — Telehealth: Payer: Self-pay

## 2021-01-30 NOTE — Telephone Encounter (Signed)
Submitted PA (see 01/30/21 phn note).    Received message:  Member should be able to get the drug/product without a PA at this time.  Notified pt via [place].

## 2021-01-30 NOTE — Telephone Encounter (Signed)
Submitted PA for Saxenda 18 mg/3 mL SOPN; key:  [id number].     Received message stating:  Member should be able to get the drug/product without a PA at this time.  Notified pt via [place].

## 2021-02-01 ENCOUNTER — Ambulatory Visit (INDEPENDENT_AMBULATORY_CARE_PROVIDER_SITE_OTHER): Payer: 59 | Admitting: Obstetrics & Gynecology

## 2021-02-01 ENCOUNTER — Encounter: Payer: Self-pay | Admitting: Obstetrics & Gynecology

## 2021-02-01 ENCOUNTER — Other Ambulatory Visit: Payer: Self-pay

## 2021-02-01 VITALS — BP 121/82 | HR 76 | Wt 242.2 lb

## 2021-02-01 DIAGNOSIS — N952 Postmenopausal atrophic vaginitis: Secondary | ICD-10-CM | POA: Diagnosis not present

## 2021-02-01 DIAGNOSIS — Z90710 Acquired absence of both cervix and uterus: Secondary | ICD-10-CM

## 2021-02-01 MED ORDER — ESTRADIOL 0.1 MG/GM VA CREA
0.5000 | TOPICAL_CREAM | Freq: Every day | VAGINAL | 10 refills | Status: DC
Start: 2021-02-01 — End: 2021-10-16

## 2021-02-01 NOTE — Telephone Encounter (Signed)
Zack with Cover my meds left v/m requesting cb about PA for saxenda 18 mg/76ml.

## 2021-02-01 NOTE — Patient Instructions (Signed)
Atrophic Vaginitis Atrophic vaginitis is a condition in which the tissues that line the vagina become dry and thin. This condition is most common in women who have stopped having regular menstrual periods (are in menopause). This usually starts when a woman is 45 to 59 years old. That is the time when a woman's estrogen levels begin to decrease. Estrogen is a female hormone. It helps to keep the tissues of the vagina moist. It stimulates the vagina to produce a clear fluid that lubricates the vagina for sex. This fluid also protects the vagina from infection. Lack of estrogen can cause the lining of the vagina to get thinner and dryer. The vagina may also shrink in size. It may become less elastic. Atrophic vaginitis tends to get worse over time as a woman's estrogen level drops. What are the causes? This condition is caused by the normal drop in estrogen that happens around the time of menopause. What increases the risk? Certain conditions or situations may lower a woman's estrogen level, leading to a higher risk for atrophic vaginitis. You are more likely to develop this condition if: You are taking medicines that block estrogen. You have had your ovaries removed. You are being treated for cancer with radiation or medicines (chemotherapy). You have given birth or are breastfeeding. You are older than age 50. You smoke. What are the signs or symptoms? Symptoms of this condition include: Pain, soreness, a feeling of pressure, or bleeding during sex (dyspareunia). Vaginal burning, irritation, or itching. Pain or bleeding when a speculum is used in a vaginal exam. Having burning pain while urinating. Vaginal discharge. In some cases, there are no symptoms. How is this diagnosed? This condition is diagnosed based on your medical history and a physical exam. This will include a pelvic exam that checks the vaginal tissues. Though rare, you may also have other tests, including: A urine test. A test  that checks the acid balance in your vagina (acid balance test). How is this treated? Treatment for this condition depends on how severe your symptoms are. Treatment may include: Using an over-the-counter vaginal lubricant before sex. Using a long-acting vaginal moisturizer. Using low-dose estrogen for moderate to severe symptoms that do not respond to other treatments. Options include creams, tablets, and inserts (vaginal rings). Before you use a vaginal estrogen, tell your health care provider if you have a history of: Breast cancer. Endometrial cancer. Blood clots. If you are not sexually active and your symptoms are very mild, you may not need treatment. Follow these instructions at home: Medicines Take over-the-counter and prescription medicines only as told by your health care provider. Do not use herbal or alternative medicines unless your health care provider says that you can. Use over-the-counter creams, lubricants, or moisturizers for dryness only as told by your health care provider. General instructions If your atrophic vaginitis is caused by menopause, discuss all of your menopause symptoms and treatment options with your health care provider. Do not douche. Do not use products that can make your vagina dry. These include: Scented feminine sprays. Scented tampons. Scented soaps. Vaginal sex can help to improve blood flow and elasticity of vaginal tissue. If you choose to have sex and it hurts, try using a water-soluble lubricant or moisturizer right before having sex. Contact a health care provider if: Your discharge looks different than normal. Your vagina has an unusual smell. You have new symptoms. Your symptoms do not improve with treatment. Your symptoms get worse. Summary Atrophic vaginitis is a condition in   which the tissues that line the vagina become dry and thin. It is most common in women who have stopped having regular menstrual periods (are in  menopause). Treatment options include using vaginal lubricants and low-dose vaginal estrogen. Contact a health care provider if your vagina has an unusual smell, or if your symptoms get worse or do not improve after treatment. This information is not intended to replace advice given to you by your health care provider. Make sure you discuss any questions you have with your health care provider. Document Revised: 12/30/2019 Document Reviewed: 12/30/2019 Elsevier Patient Education  2022 Elsevier Inc.  

## 2021-02-01 NOTE — Progress Notes (Addendum)
GYNECOLOGY OFFICE VISIT NOTE  History:   Stacey Nichols is a 60 y.o. [phone] here today for follow up ultrasound results and to determine if she needs a pap. Patient is Spanish-speaking only, interpreter present for this encounter.  Reports having a hysterectomy in [place] for bleeding and fibroids, was unsure if needs pap smears or not and is unsure if she has a cervix. She says she does have an ovary in place. Speculum exam was attempted  during her last visit in 09/2020, she was unable to tolerate this, pelvic ultrasound was ordered.  Patient also reports bothersome vaginal dryness, wants treatment for this.  She denies any abnormal vaginal discharge, bleeding, pelvic pain or other concerns.    Past Medical History:  Diagnosis Date   AML (acute myeloblastic leukemia) ([place]) 2007   s/p chemo (Dr Lissa Merlin)   Anemia    hx of   Anxiety    Arthritis    severe   Constipation    Depression    Diabetes mellitus without complication ([place])    Fatty liver    GERD (gastroesophageal reflux disease)    H/O Bell's palsy    H/O hiatal hernia    Headache    occasional   High triglycerides    History of cancer chemotherapy    Leukemia ([place])    Morbidly obese ([place])    Osteopenia 06/08/2017   DEXA 2017 - T -1.8 spine   Pneumonia    hx of    Rheumatoid arthritis ([place])    Sleep apnea    Stroke ([place])    "mini stroke-caused bells palsy for 1 month"    Past Surgical History:  Procedure Laterality Date   ABDOMINAL HYSTERECTOMY  2001   heavy bleeding, fibroids, 1 ovary remains   BLEPHAROPLASTY Bilateral 06/2020   in [place]   COLONOSCOPY WITH PROPOFOL N/A 09/19/2020   TA, int hem, rpt 7 yrs Allen Norris, [place], MD)   FACIAL COSMETIC SURGERY  06/2020   in [place]   JOINT REPLACEMENT     BILATERAL KNEE REPLACEMENTS   LAPAROSCOPIC GASTRIC BAND REMOVAL WITH LAPAROSCOPIC GASTRIC SLEEVE RESECTION  04/26/2014   LAPAROSCOPIC GASTRIC BANDING  2000   performed in [place]   TUBAL LIGATION  1996   UPPER GI  ENDOSCOPY N/A 04/26/2014   Procedure: UPPER GI ENDOSCOPY;  Surgeon: Pedro Earls, MD;  Location: [facility];  Service: General;  Laterality: N/A;    The following portions of the patient's history were reviewed and updated as appropriate: allergies, current medications, past family history, past medical history, past social history, past surgical history and problem list.   Health Maintenance:  Normal mammogram on 04/27/2020.   Review of Systems:  Pertinent items noted in HPI and remainder of comprehensive ROS otherwise negative.  Physical Exam:  BP 121/82   Pulse 76   Wt 242 lb 3.2 oz (109.9 kg)   BMI 44.30 kg/m  CONSTITUTIONAL: Well-developed, well-nourished female in no acute distress.  HEENT:  Normocephalic, atraumatic. External right and left ear normal. No scleral icterus.  NECK: Normal range of motion, supple, no masses noted on observation SKIN: No rash noted. Not diaphoretic. No erythema. No pallor. MUSCULOSKELETAL: Normal range of motion. No edema noted. NEUROLOGIC: Alert and oriented to person, place, and time. Normal muscle tone coordination. No cranial nerve deficit noted. PSYCHIATRIC: Normal mood and affect. Normal behavior. Normal judgment and thought content. CARDIOVASCULAR: Normal heart rate noted RESPIRATORY: Effort and breath sounds normal, no problems with respiration noted ABDOMEN: Obese.  No masses noted. No other overt distention noted.  Non tender. PELVIC: Mildly atrophic external genitalia ,urethral meatus, distal vaginal mucosa.  No abnormal discharge noted. Speculum exam deferred, bimanual exam revealed smooth vaginal mucosa with loss of ruggae, well healed smooth vaginal cuff and absence of cervix.  No adnexa palpated and no tenderness. Performed in the presence of a chaperone.  Imaging: 09/25/2020 TRANSABDOMINAL AND TRANSVAGINAL ULTRASOUND OF PELVIS CLINICAL DATA:  Unable to tolerate Pap smear and pelvic exam in office, history of hysterectomy 25 years ago,  uncertain remaining pelvic organs COMPARISON:  None  FINDINGS: Uterus  Surgically absent  Endometrium  N/A  Right ovary Not visualized question surgically absent versus obscured by bowel  Left ovary Not visualized question surgically absent versus obscured by bowel  Other findings No free pelvic fluid.  No adnexal masses.  IMPRESSION: Surgical absence of uterus with nonvisualization of ovaries. No pelvic sonographic abnormalities identified.  Electronically Signed  By: Lavonia Dana M.D.  On: 09/25/2020 11:07    Assessment and Plan:     1. Vaginal atrophy Discussed treatment for menopausal vulvovaginal atrophy, recommended vaginal estrogen therapy. She will try this, reevaluate in two months.  If she doe snot like vaginal modality, will consider Osphena.  - estradiol (ESTRACE VAGINAL) 0.1 MG/GM vaginal cream; Place 0.5 Applicatorfuls vaginally daily. For two weeks, then use three times a week  Dispense: 42.5 g; Refill: 10  2. S/P total abdominal hysterectomy No cervix palpated on exam, nothing visualized on ultrasound. Patient reassured by ultrasound and exam findings. Surgical history updated accordingly. Given that hysterectomy was done for benign indications, there is no reason for any pap smear screening.    Routine preventative health maintenance measures emphasized. Please refer to After Visit Summary for other counseling recommendations.   Return in about 2 months (around 04/04/2021) for Followup for vaginal estrogen therapy.    I spent 20 minutes dedicated to the care of this patient including pre-visit review of records, face to face time with the patient discussing her conditions and treatments and post visit orders.    Verita Schneiders, MD, [place] for [employer], [place]

## 2021-02-04 ENCOUNTER — Other Ambulatory Visit: Payer: Self-pay | Admitting: Family Medicine

## 2021-02-06 NOTE — Telephone Encounter (Signed)
Naproxen Last filled:  01/08/21, #60 Last OV:  01/08/21, 6 wk wt mgmt f/u Next OV:  none

## 2021-02-07 NOTE — Telephone Encounter (Signed)
Spoke with Katie from cover my meds and they were just following up on the medication and to see if patient had any trouble filling it. I see mychart message from patient to Dr Darnell Level but it is in [facility] but it looks like medication is very expensive and she is asking about a coupon. Nothing further is needed for cover my meds at this  time

## 2021-02-08 MED ORDER — TOPIRAMATE 50 MG PO TABS: 50.0000 mg | ORAL_TABLET | Freq: Every day | ORAL | 2 refills | Status: DC

## 2021-02-08 NOTE — Addendum Note (Signed)
Addended by: Ria Bush on: 02/08/2021 11:32 AM   Modules accepted: Orders

## 2021-03-06 ENCOUNTER — Other Ambulatory Visit: Payer: Self-pay | Admitting: Family Medicine

## 2021-03-16 ENCOUNTER — Other Ambulatory Visit: Payer: Self-pay | Admitting: Family Medicine

## 2021-03-16 DIAGNOSIS — E785 Hyperlipidemia, unspecified: Secondary | ICD-10-CM

## 2021-03-16 NOTE — Telephone Encounter (Signed)
E-scribed refill.  Plz schedule lab and cpe visits.  

## 2021-03-28 ENCOUNTER — Encounter: Payer: Self-pay | Admitting: Family Medicine

## 2021-03-29 MED ORDER — SEMAGLUTIDE(0.25 OR 0.5MG/DOS) 2 MG/1.5ML ~~LOC~~ SOPN: PEN_INJECTOR | SUBCUTANEOUS | 1 refills | Status: AC

## 2021-04-02 ENCOUNTER — Other Ambulatory Visit: Payer: Self-pay | Admitting: Family Medicine

## 2021-04-02 NOTE — Telephone Encounter (Signed)
Refill request Naproxen Last refill 03/06/21 #60 Last office visit 01/08/21 Confirm directions

## 2021-05-06 ENCOUNTER — Other Ambulatory Visit: Payer: Self-pay | Admitting: Family Medicine

## 2021-05-07 ENCOUNTER — Other Ambulatory Visit: Payer: Self-pay | Admitting: Family Medicine

## 2021-05-08 NOTE — Telephone Encounter (Signed)
Refill request Topamax Last refill 02/08/21 Last office visit 01/08/21 #30/2

## 2021-05-09 NOTE — Telephone Encounter (Signed)
ERx 

## 2021-05-09 NOTE — Telephone Encounter (Signed)
Refill request Naprosyn Last refill 04/03/21 #60 Last office visit 01/08/21

## 2021-05-19 IMAGING — MG DIGITAL SCREENING BILAT W/ TOMO W/ CAD
6 of 12 series · 6 of 36 positions shown · non-contrast
Comparison: Previous exams 10/29/2017 and earlier from [facility]
[REDACTED] in [HOSPITAL], [place] [HOSPITAL].

CLINICAL DATA: Screening.

EXAM:
DIGITAL SCREENING BILATERAL MAMMOGRAM WITH TOMO AND CAD

[L CC synth-2D (1 of 2)]
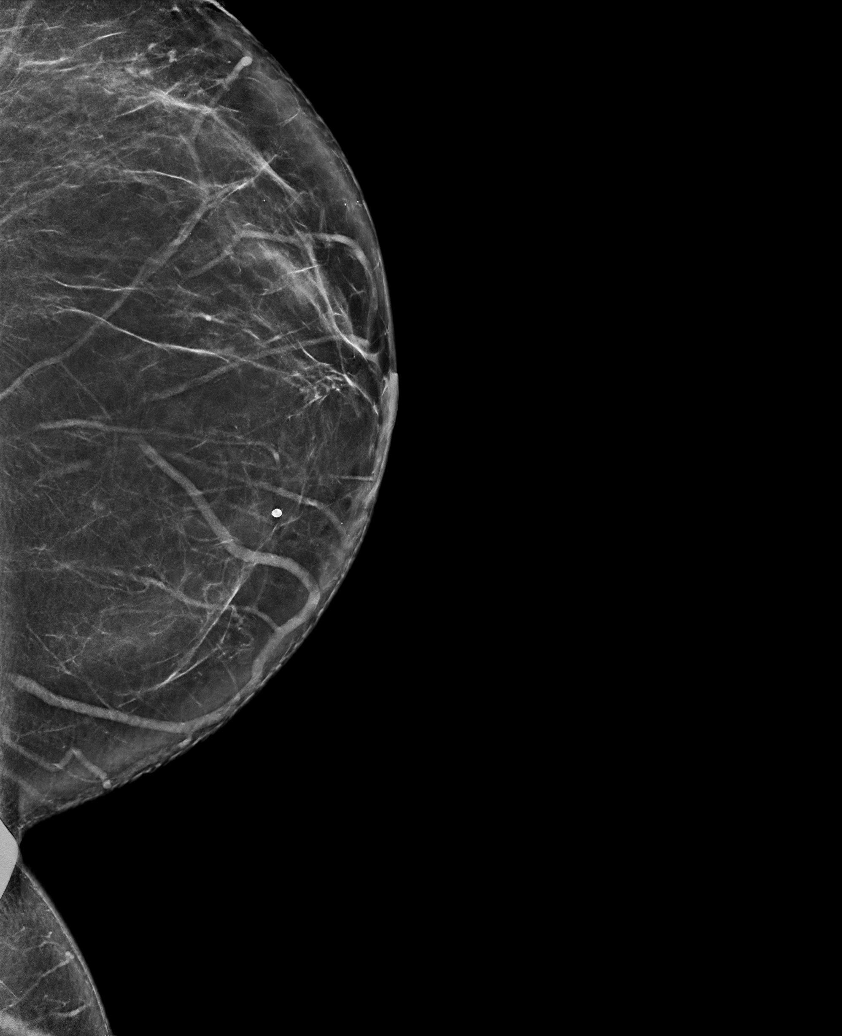

[R MLO synth-2D]
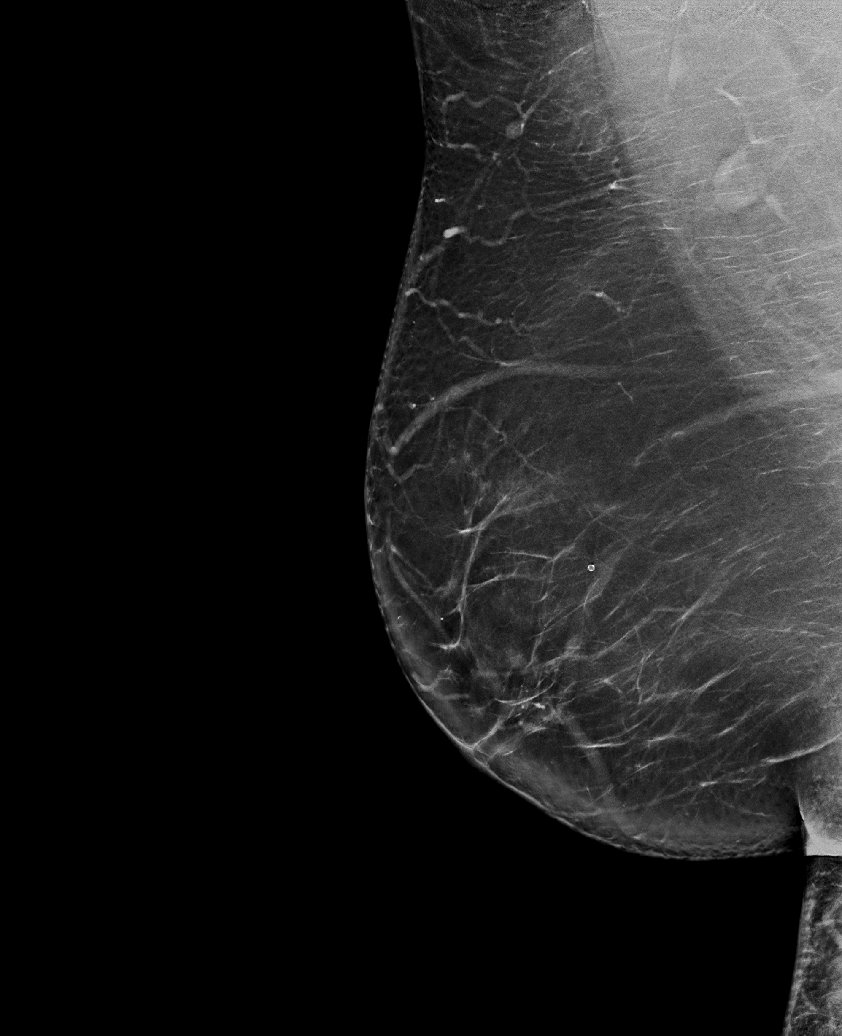

[R CC synth-2D (1 of 2)]
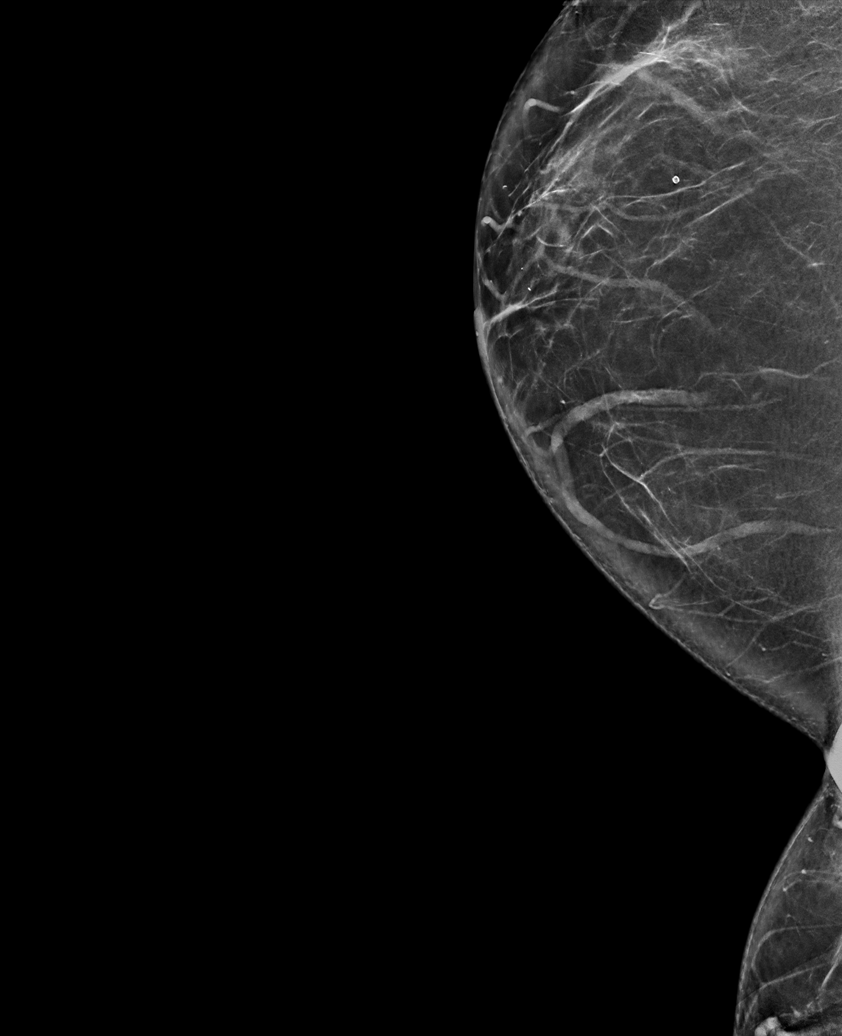

[R CC synth-2D (2 of 2)]
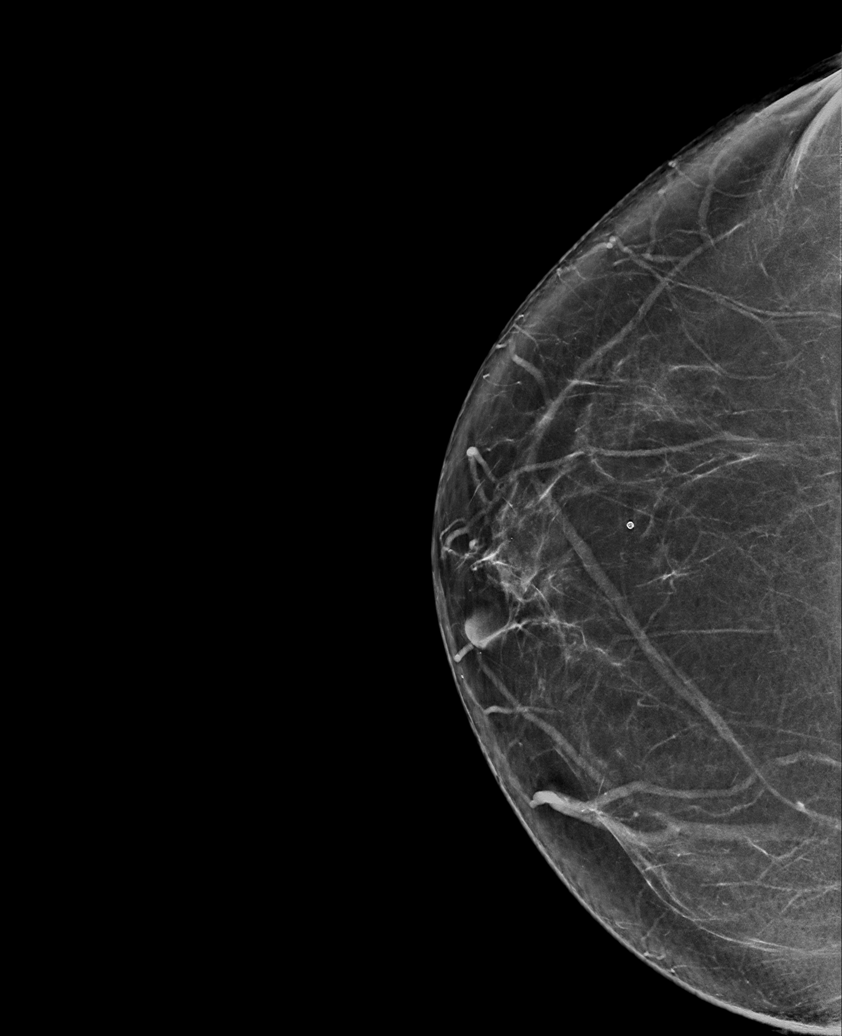

[L CC synth-2D (2 of 2)]
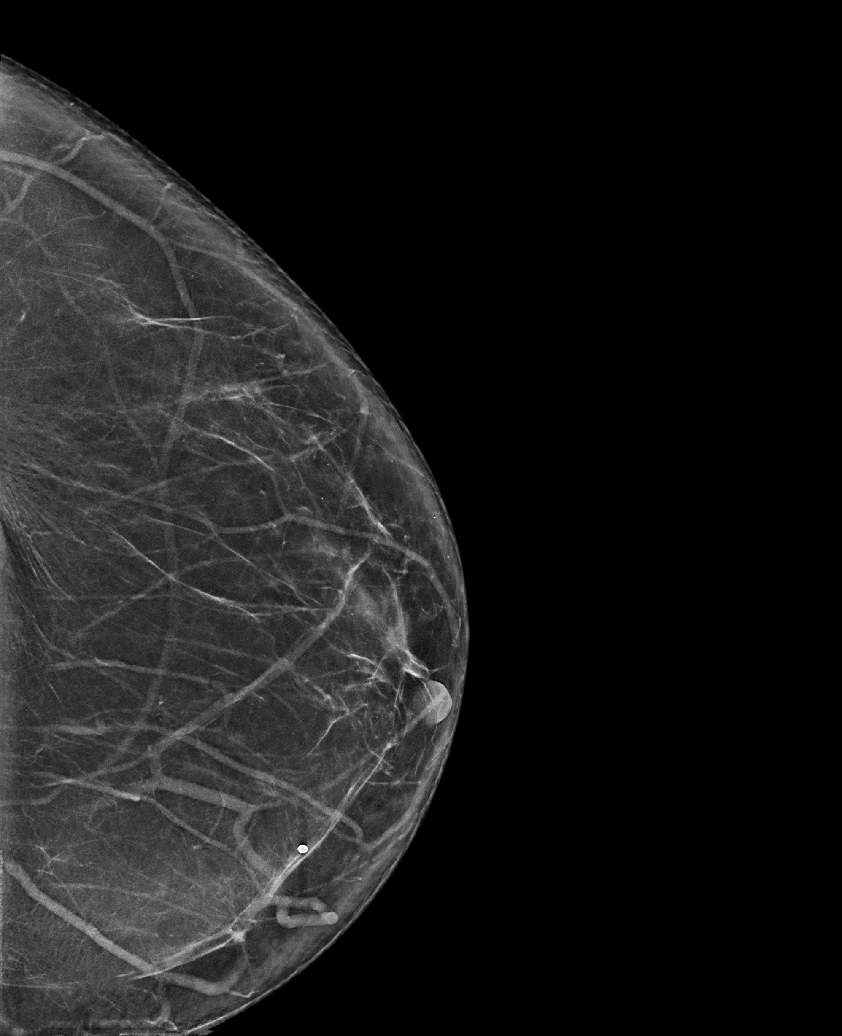

[L MLO synth-2D]
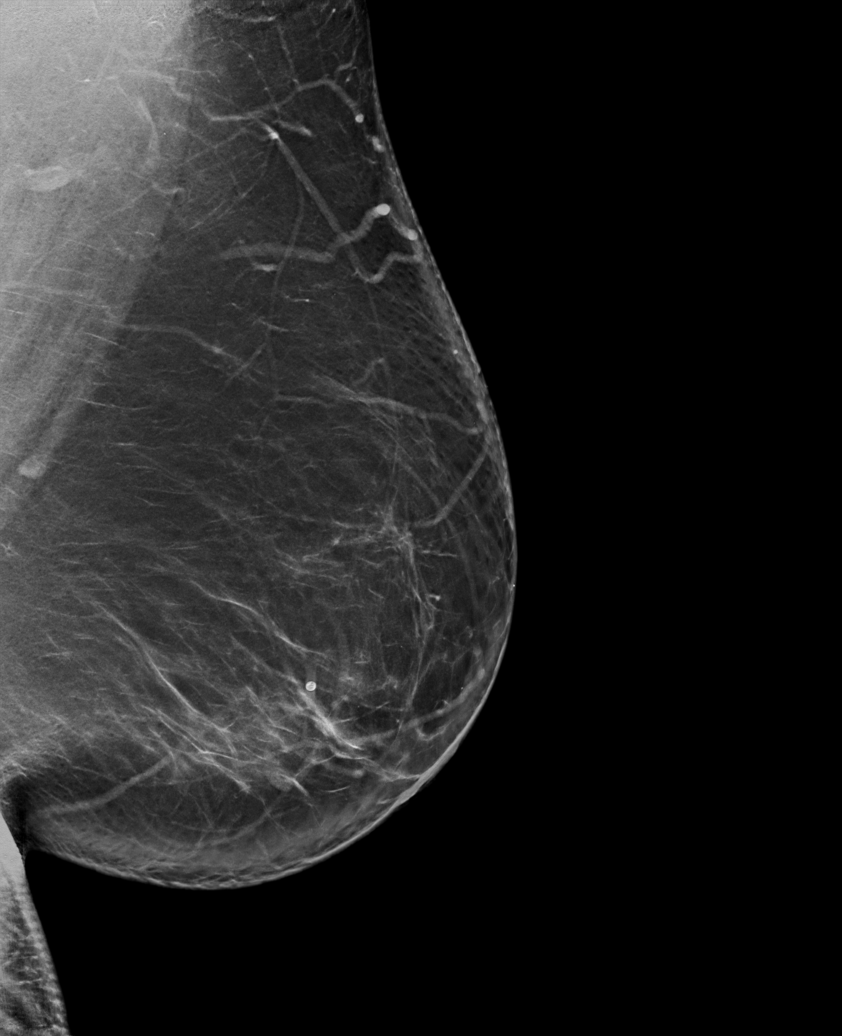

[6 of 36 positions shown; findings below may reference images not displayed]

Awaiting the prior
outside mammograms accounts for the delay in this report.

ACR Breast Density Category b: There are scattered areas of
fibroglandular density.
FINDINGS: There are no findings suspicious for malignancy. Images were
processed with CAD.
IMPRESSION: No mammographic evidence of malignancy. A result letter of this
screening mammogram will be mailed directly to the patient.

RECOMMENDATION:
Screening mammogram in one year. (Code:TN-Z-SVQ)

BI-RADS CATEGORY  1: Negative.

## 2021-05-30 ENCOUNTER — Other Ambulatory Visit: Payer: Self-pay | Admitting: Family Medicine

## 2021-07-03 ENCOUNTER — Encounter: Payer: Self-pay | Admitting: Family Medicine

## 2021-07-04 ENCOUNTER — Other Ambulatory Visit: Payer: Self-pay

## 2021-07-04 ENCOUNTER — Telehealth (INDEPENDENT_AMBULATORY_CARE_PROVIDER_SITE_OTHER): Payer: 59 | Admitting: Family Medicine

## 2021-07-04 ENCOUNTER — Encounter: Payer: Self-pay | Admitting: Family Medicine

## 2021-07-04 DIAGNOSIS — U071 COVID-19: Secondary | ICD-10-CM | POA: Insufficient documentation

## 2021-07-04 HISTORY — DX: COVID-19: U07.1

## 2021-07-04 MED ORDER — NIRMATRELVIR/RITONAVIR (PAXLOVID)TABLET: 3.0000 | ORAL_TABLET | Freq: Two times a day (BID) | ORAL | 0 refills | Status: AC

## 2021-07-04 NOTE — Progress Notes (Signed)
Patient ID: Stacey Nichols, female    DOB: 03/29/1961, 60 y.o.   MRN: [id number]  Virtual visit completed through [employer], a video enabled telemedicine application. Due to national recommendations of social distancing due to COVID-19, a virtual visit is felt to be most appropriate for this patient at this time. Reviewed limitations, risks, security and privacy concerns of performing a virtual visit and the availability of in person appointments. I also reviewed that there may be a patient responsible charge related to this service. The patient agreed to proceed.   Patient location: home Provider location: [place] at [facility], office Persons participating in this virtual visit: patient, provider   If any vitals were documented, they were collected by patient at home unless specified below.    Ht 5\' 2"  (1.575 m)    Wt 230 lb (104.3 kg)    BMI 42.07 kg/m    CC: COVID infection Subjective:   HPI: Stacey Nichols is a 60 y.o. female presenting on 07/04/2021 for Covid Positive (C/o loss of smell/taste, SOB and nasal congestion. Sxs started 06/30/21.  Pos home test- 07/02/21, then tested at [facility] on 07/02/21- pos results today. )    First day of symptoms: 06/30/2021 Tested COVID positive: 07/02/2021  Current symptoms: nasal congestion, diarrhea, loss of taste and smell, mild dyspnea with exertion  No fevers/chills, abd pain, wheezing, headache, ST, body aches Treatments to date: dayquil, nyquil  Risk factors include: obesity, h/o CVA  First time she has COVID.  COVID vaccination status: Pfizer x3      Relevant past medical, surgical, family and social history reviewed and updated as indicated. Interim medical history since our last visit reviewed. Allergies and medications reviewed and updated. Outpatient Medications Prior to Visit  Medication Sig Dispense Refill   acetaminophen (TYLENOL) 500 MG tablet Take 500 mg by mouth every 6 (six) hours as needed.     aspirin 81 MG  tablet Take 81 mg by mouth daily.     b complex vitamins capsule Take 1 capsule by mouth daily. 90 capsule 3   estradiol (ESTRACE VAGINAL) 0.1 MG/GM vaginal cream Place 0.5 Applicatorfuls vaginally daily. For two weeks, then use three times a week 42.5 g 10   Ibuprofen (ADVIL PO) Take by mouth.     metFORMIN (GLUCOPHAGE) 500 MG tablet Take 1 tablet (500 mg total) by mouth daily with breakfast. 90 tablet 3   Multiple Vitamin (MULTIVITAMIN) tablet Take 1 tablet by mouth daily.     naproxen (NAPROSYN) 500 MG tablet TAKE 1 TABLET BY MOUTH TWICE A DAY FOR 7 DAYS THEN AS NEEDED FOR PAIN *TAKE WITH FOOD* 60 tablet 3   rosuvastatin (CRESTOR) 10 MG tablet TAKE 1 TABLET BY MOUTH EVERYDAY AT BEDTIME 90 tablet 1   Varenicline Tartrate (TYRVAYA) 0.03 MG/ACT SOLN Place into the nose.     Vitamin D, Ergocalciferol, (DRISDOL) 1.25 MG (50000 UNIT) CAPS capsule TAKE 1 TABLET BY MOUTH ONE TIME PER WEEK 12 capsule 3   XIIDRA 5 % SOLN Apply 1 drop to eye 2 (two) times daily.     topiramate (TOPAMAX) 50 MG tablet TAKE 1 TABLET BY MOUTH EVERYDAY AT BEDTIME 90 tablet 0   No facility-administered medications prior to visit.     Per HPI unless specifically indicated in ROS section below Review of Systems Objective:  Ht 5\' 2"  (1.575 m)    Wt 230 lb (104.3 kg)    BMI 42.07 kg/m   Wt Readings from Last 3 Encounters:  07/04/21 230 lb (104.3 kg)  02/01/21 242 lb 3.2 oz (109.9 kg)  01/08/21 245 lb (111.1 kg)       Physical exam: Gen: alert, NAD, not ill appearing Pulm: speaks in complete sentences without increased work of breathing Psych: normal mood, normal thought content      Results for orders placed or performed in visit on 88/41/66  Basic metabolic panel  Result Value Ref Range   Sodium 140 135 - 145 mEq/L   Potassium 4.1 3.5 - 5.1 mEq/L   Chloride 105 96 - 112 mEq/L   CO2 26 19 - 32 mEq/L   Glucose, Bld 101 (H) 70 - 99 mg/dL   BUN 32 (H) 6 - 23 mg/dL   Creatinine, Ser 0.90 0.40 - 1.20 mg/dL   GFR  70.23 >60.00 mL/min   Calcium 9.6 8.4 - 10.5 mg/dL  CBC with Differential/Platelet  Result Value Ref Range   WBC 7.4 4.0 - 10.5 K/uL   RBC 4.66 3.87 - 5.11 Mil/uL   Hemoglobin 15.1 (H) 12.0 - 15.0 g/dL   HCT 45.9 36.0 - 46.0 %   MCV 98.5 78.0 - 100.0 fl   MCHC 32.8 30.0 - 36.0 g/dL   RDW 14.5 11.5 - 15.5 %   Platelets 160.0 150.0 - 400.0 K/uL   Neutrophils Relative % 51.8 43.0 - 77.0 %   Lymphocytes Relative 30.8 12.0 - 46.0 %   Monocytes Relative 11.8 3.0 - 12.0 %   Eosinophils Relative 5.1 (H) 0.0 - 5.0 %   Basophils Relative 0.5 0.0 - 3.0 %   Neutro Abs 3.8 1.4 - 7.7 K/uL   Lymphs Abs 2.3 0.7 - 4.0 K/uL   Monocytes Absolute 0.9 0.1 - 1.0 K/uL   Eosinophils Absolute 0.4 0.0 - 0.7 K/uL   Basophils Absolute 0.0 0.0 - 0.1 K/uL  VITAMIN D 25 Hydroxy (Vit-D Deficiency, Fractures)  Result Value Ref Range   VITD 46.77 30.00 - 100.00 ng/mL   Assessment & Plan:   Problem List Items Addressed This Visit     COVID-19 virus infection    Reviewed currently approved EUA treatments.  Reviewed expected course of illness, anticipated course of recovery, as well as red flags to suggest COVID pneumonia and/or to seek urgent in-person care. Reviewed latest CDC isolation/quarantine guidelines.  Encouraged fluids and rest. Reviewed further supportive care measures at home including vit C 500mg  bid, vit D 2000 IU daily, zinc 100mg  daily, tylenol PRN, pepcid 20mg  BID PRN.   Recommend:  Full dose paxlvoid Paxlovid drug interactions:  Rosuvastatin - hold while on paxlovid and 2 days later      Relevant Medications   nirmatrelvir/ritonavir EUA (PAXLOVID) 20 x 150 MG & 10 x 100MG  TABS     Meds ordered this encounter  Medications   nirmatrelvir/ritonavir EUA (PAXLOVID) 20 x 150 MG & 10 x 100MG  TABS    Sig: Take 3 tablets by mouth 2 (two) times daily for 5 days. (Take nirmatrelvir 150 mg two tablets twice daily for 5 days and ritonavir 100 mg one tablet twice daily for 5 days) Patient GFR is 70     Dispense:  30 tablet    Refill:  0   No orders of the defined types were placed in this encounter.   I discussed the assessment and treatment plan with the patient. The patient was provided an opportunity to ask questions and all were answered. The patient agreed with the plan and demonstrated an understanding of the instructions. The patient was  advised to call back or seek an in-person evaluation if the symptoms worsen or if the condition fails to improve as anticipated.  Follow up plan: No follow-ups on file.  Ria Bush, MD

## 2021-07-04 NOTE — Telephone Encounter (Signed)
Pt rtn call.  Dr. Darnell Level ok'd adding pt at 1:00.  Pt has been added to schedule, worked up and is ready.

## 2021-07-04 NOTE — Assessment & Plan Note (Addendum)
Reviewed currently approved EUA treatments.  Reviewed expected course of illness, anticipated course of recovery, as well as red flags to suggest COVID pneumonia and/or to seek urgent in-person care. Reviewed latest CDC isolation/quarantine guidelines.  Encouraged fluids and rest. Reviewed further supportive care measures at home including vit C 500mg  bid, vit D 2000 IU daily, zinc 100mg  daily, tylenol PRN, pepcid 20mg  BID PRN.   Recommend:  Full dose paxlvoid Paxlovid drug interactions:  Rosuvastatin - hold while on paxlovid and 2 days later

## 2021-07-04 NOTE — Telephone Encounter (Signed)
Plz call for update on COVID symptoms and offer virtual for 12:30pm today.  She is likely outside of antiviral window but we can review symptoms and discuss symptomatic treatment.

## 2021-07-04 NOTE — Telephone Encounter (Addendum)
Lvm asking pt to call back (attempted several times to contact).  Need to offer MyChart visit at 12:30 for possible COVID.  Also, sending MyChart message.

## 2021-07-16 ENCOUNTER — Other Ambulatory Visit: Payer: Self-pay | Admitting: Family Medicine

## 2021-07-16 DIAGNOSIS — E039 Hypothyroidism, unspecified: Secondary | ICD-10-CM

## 2021-07-16 DIAGNOSIS — E781 Pure hyperglyceridemia: Secondary | ICD-10-CM

## 2021-07-16 DIAGNOSIS — C9201 Acute myeloblastic leukemia, in remission: Secondary | ICD-10-CM

## 2021-07-16 DIAGNOSIS — E538 Deficiency of other specified B group vitamins: Secondary | ICD-10-CM

## 2021-07-16 DIAGNOSIS — R7303 Prediabetes: Secondary | ICD-10-CM

## 2021-07-16 DIAGNOSIS — E559 Vitamin D deficiency, unspecified: Secondary | ICD-10-CM

## 2021-07-16 DIAGNOSIS — Z9884 Bariatric surgery status: Secondary | ICD-10-CM

## 2021-07-16 DIAGNOSIS — E785 Hyperlipidemia, unspecified: Secondary | ICD-10-CM

## 2021-07-18 ENCOUNTER — Other Ambulatory Visit: Payer: Self-pay

## 2021-07-20 ENCOUNTER — Other Ambulatory Visit (INDEPENDENT_AMBULATORY_CARE_PROVIDER_SITE_OTHER): Payer: 59

## 2021-07-20 ENCOUNTER — Other Ambulatory Visit: Payer: Self-pay

## 2021-07-20 DIAGNOSIS — E559 Vitamin D deficiency, unspecified: Secondary | ICD-10-CM

## 2021-07-20 DIAGNOSIS — E538 Deficiency of other specified B group vitamins: Secondary | ICD-10-CM | POA: Diagnosis not present

## 2021-07-20 DIAGNOSIS — R7303 Prediabetes: Secondary | ICD-10-CM

## 2021-07-20 DIAGNOSIS — E039 Hypothyroidism, unspecified: Secondary | ICD-10-CM | POA: Diagnosis not present

## 2021-07-20 DIAGNOSIS — E781 Pure hyperglyceridemia: Secondary | ICD-10-CM

## 2021-07-20 DIAGNOSIS — E785 Hyperlipidemia, unspecified: Secondary | ICD-10-CM

## 2021-07-20 DIAGNOSIS — C9201 Acute myeloblastic leukemia, in remission: Secondary | ICD-10-CM | POA: Diagnosis not present

## 2021-07-20 DIAGNOSIS — Z9884 Bariatric surgery status: Secondary | ICD-10-CM

## 2021-07-20 LAB — CBC WITH DIFFERENTIAL/PLATELET
Basophils Absolute: 0 10*3/uL (ref 0.0–0.1)
Basophils Relative: 0.3 % (ref 0.0–3.0)
Eosinophils Absolute: 0.2 10*3/uL (ref 0.0–0.7)
Eosinophils Relative: 2.6 % (ref 0.0–5.0)
HCT: 44 % (ref 36.0–46.0)
Hemoglobin: 14.4 g/dL (ref 12.0–15.0)
Lymphocytes Relative: 33.4 % (ref 12.0–46.0)
Lymphs Abs: 2.5 10*3/uL (ref 0.7–4.0)
MCHC: 32.8 g/dL (ref 30.0–36.0)
MCV: 98.3 fl (ref 78.0–100.0)
Monocytes Absolute: 0.7 10*3/uL (ref 0.1–1.0)
Monocytes Relative: 9.7 % (ref 3.0–12.0)
Neutro Abs: 4 10*3/uL (ref 1.4–7.7)
Neutrophils Relative %: 54 % (ref 43.0–77.0)
Platelets: 184 10*3/uL (ref 150.0–400.0)
RBC: 4.47 Mil/uL (ref 3.87–5.11)
RDW: 14.8 % (ref 11.5–15.5)
WBC: 7.4 10*3/uL (ref 4.0–10.5)

## 2021-07-20 LAB — LIPID PANEL
Cholesterol: 143 mg/dL (ref 0–200)
HDL: 57.1 mg/dL (ref >39.00)
LDL Cholesterol: 52 mg/dL (ref 0–99)
NonHDL: 86.25
Total CHOL/HDL Ratio: 3
Triglycerides: 171 mg/dL — ABNORMAL HIGH (ref 0.0–149.0)
VLDL: 34.2 mg/dL (ref 0.0–40.0)

## 2021-07-20 LAB — COMPREHENSIVE METABOLIC PANEL
ALT: 21 U/L (ref 0–35)
AST: 19 U/L (ref 0–37)
Albumin: 4.3 g/dL (ref 3.5–5.2)
Alkaline Phosphatase: 65 U/L (ref 39–117)
BUN: 24 mg/dL — ABNORMAL HIGH (ref 6–23)
CO2: 28 mEq/L (ref 19–32)
Calcium: 9.5 mg/dL (ref 8.4–10.5)
Chloride: 104 mEq/L (ref 96–112)
Creatinine, Ser: 0.83 mg/dL (ref 0.40–1.20)
GFR: 77.11 mL/min (ref >60.00)
Glucose, Bld: 89 mg/dL (ref 70–99)
Potassium: 4.7 mEq/L (ref 3.5–5.1)
Sodium: 141 mEq/L (ref 135–145)
Total Bilirubin: 0.5 mg/dL (ref 0.2–1.2)
Total Protein: 6.8 g/dL (ref 6.0–8.3)

## 2021-07-20 LAB — VITAMIN D 25 HYDROXY (VIT D DEFICIENCY, FRACTURES): VITD: 27.91 ng/mL — ABNORMAL LOW (ref 30.00–100.00)

## 2021-07-20 LAB — FOLATE: Folate: >24.2 ng/mL (ref >5.9)

## 2021-07-20 LAB — IBC PANEL
Iron: 147 ug/dL — ABNORMAL HIGH (ref 42–145)
Saturation Ratios: 37.6 % (ref 20.0–50.0)
TIBC: 390.6 ug/dL (ref 250.0–450.0)
Transferrin: 279 mg/dL (ref 212.0–360.0)

## 2021-07-20 LAB — TSH: TSH: 1.99 u[IU]/mL (ref 0.35–5.50)

## 2021-07-20 LAB — HEMOGLOBIN A1C: Hgb A1c MFr Bld: 6.3 % (ref 4.6–6.5)

## 2021-07-20 LAB — VITAMIN B12: Vitamin B-12: 1102 pg/mL — ABNORMAL HIGH (ref 211–911)

## 2021-07-20 LAB — FERRITIN: Ferritin: 110.7 ng/mL (ref 10.0–291.0)

## 2021-07-20 NOTE — Addendum Note (Signed)
Addended by: Neta Ehlers on: 07/20/2021 01:22 PM   Modules accepted: Orders

## 2021-07-20 NOTE — Addendum Note (Signed)
Addended by: Neta Ehlers on: 07/20/2021 01:23 PM   Modules accepted: Orders

## 2021-07-20 NOTE — Addendum Note (Signed)
Addended by: Leeanne Rio on: 07/20/2021 02:44 PM   Modules accepted: Orders

## 2021-07-20 NOTE — Addendum Note (Signed)
Addended by: Neta Ehlers on: 07/20/2021 01:21 PM   Modules accepted: Orders

## 2021-07-25 ENCOUNTER — Encounter: Payer: Self-pay | Admitting: Family Medicine

## 2021-07-25 ENCOUNTER — Other Ambulatory Visit: Payer: Self-pay

## 2021-07-25 ENCOUNTER — Ambulatory Visit (INDEPENDENT_AMBULATORY_CARE_PROVIDER_SITE_OTHER): Payer: 59 | Admitting: Family Medicine

## 2021-07-25 VITALS — BP 120/72 | HR 80 | Temp 97.8°F | Ht 63.0 in | Wt 234.3 lb

## 2021-07-25 DIAGNOSIS — M8580 Other specified disorders of bone density and structure, unspecified site: Secondary | ICD-10-CM | POA: Diagnosis not present

## 2021-07-25 DIAGNOSIS — Z23 Encounter for immunization: Secondary | ICD-10-CM | POA: Diagnosis not present

## 2021-07-25 DIAGNOSIS — Z9884 Bariatric surgery status: Secondary | ICD-10-CM | POA: Diagnosis not present

## 2021-07-25 DIAGNOSIS — E538 Deficiency of other specified B group vitamins: Secondary | ICD-10-CM

## 2021-07-25 DIAGNOSIS — E039 Hypothyroidism, unspecified: Secondary | ICD-10-CM

## 2021-07-25 DIAGNOSIS — C9201 Acute myeloblastic leukemia, in remission: Secondary | ICD-10-CM

## 2021-07-25 DIAGNOSIS — E785 Hyperlipidemia, unspecified: Secondary | ICD-10-CM

## 2021-07-25 DIAGNOSIS — R7303 Prediabetes: Secondary | ICD-10-CM

## 2021-07-25 DIAGNOSIS — E559 Vitamin D deficiency, unspecified: Secondary | ICD-10-CM

## 2021-07-25 DIAGNOSIS — Z8742 Personal history of other diseases of the female genital tract: Secondary | ICD-10-CM

## 2021-07-25 DIAGNOSIS — Z0000 Encounter for general adult medical examination without abnormal findings: Secondary | ICD-10-CM | POA: Diagnosis not present

## 2021-07-25 MED ORDER — METFORMIN HCL 500 MG PO TABS: 500.0000 mg | ORAL_TABLET | Freq: Every day | ORAL | 3 refills | Status: DC

## 2021-07-25 MED ORDER — B COMPLEX VITAMINS PO CAPS: 1.0000 | ORAL_CAPSULE | ORAL | Status: DC

## 2021-07-25 MED ORDER — WEGOVY 0.25 MG/0.5ML ~~LOC~~ SOAJ: 0.2500 mg | SUBCUTANEOUS | 0 refills | Status: DC

## 2021-07-25 MED ORDER — ROSUVASTATIN CALCIUM 10 MG PO TABS: ORAL_TABLET | ORAL | 3 refills | Status: DC

## 2021-07-25 MED ORDER — VITAMIN D (ERGOCALCIFEROL) 1.25 MG (50000 UNIT) PO CAPS: ORAL_CAPSULE | ORAL | 3 refills | Status: DC

## 2021-07-25 NOTE — Assessment & Plan Note (Signed)
Stable period off replacement. She is taking biotin.

## 2021-07-25 NOTE — Assessment & Plan Note (Signed)
Low levels despite weekly replacement - continue this.

## 2021-07-25 NOTE — Assessment & Plan Note (Signed)
Established with GYN with recent reassuring pelvic US - appreciate their care.

## 2021-07-25 NOTE — Progress Notes (Signed)
Patient ID: Stacey Nichols, female    DOB: Mar 09, 1961, 61 y.o.   MRN: [id number]  This visit was conducted in person.  BP 120/72    Pulse 80    Temp 97.8 F (36.6 C) (Temporal)    Ht 5\' 3"  (1.6 m)    Wt 234 lb 5 oz (106.3 kg)    SpO2 95%    BMI 41.51 kg/m    CC: CPE Subjective:   HPI: Stacey Nichols is a 61 y.o. female presenting on 07/25/2021 for Annual Exam   Mother passed away last year.   COVID infection 06/2021 - symptoms have significantly improved. Persistent loss of taste as well as sleep disturbances. She's been taking homeopathic medications.   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 And R knee surgery 06/2018. Sees [place] ortho Dr Vickii Chafe.    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   Interested in trying wegovy. Continues regular exercise 3-45 times weekly - walking 20+ min/day.    Preventative: Colon cancer screening - colonoscopy ~2015, rec rpt 5 yrs (in [place]) COLONOSCOPY WITH PROPOFOL 09/19/2020 - TA, int hem, rpt 7 yrs Allen Norris, Darren, MD)  Well woman exam - s/p hysterectomy 2001 precancerous cells, 1 ovary remains. Last well woman 01/2021 [facility]  Mammogram 04/2020 Birads1 @ [place]  DEXA 12/2015 - osteopenia (T -1.8 Lspine) Lung cancer screening - not eligible Flu shot yearly [place] 09/2019, 10/2019, booster 06/2020.  Tetanus - unsure >15 yrs ago Shingrix - 07/2020, rpt today  Seat belt use discussed Sunscreen use discussed. No changing moles on skin.  Ex smoker ~2010, previously 1/4 ppd for ~20 yrs Alcohol - seldom  Dentist Q25mo  Eye exam - yearly   From [place] Lives alone  Separated from husband after leukemia  Occ: retired, was Armed forces operational officer  Edu: 2 yrs college Activity: no regular exercise  Diet: good ater, fruits/vegetables daily       Relevant past medical, surgical, family and social history reviewed and updated as indicated. Interim medical history since our last visit reviewed. Allergies and medications reviewed and updated. Outpatient Medications Prior to Visit  Medication Sig Dispense Refill   acetaminophen (TYLENOL) 500 MG tablet Take 500 mg by mouth every 6 (six) hours as needed.     aspirin 81 MG tablet Take 81 mg by mouth daily.     Biotin 2500 MCG CAPS Take by mouth. Takes 2 chews daily     estradiol (ESTRACE VAGINAL) 0.1 MG/GM vaginal cream Place 0.5 Applicatorfuls vaginally daily. For two weeks, then use three times a week 42.5 g 10   Ibuprofen (ADVIL PO) Take by mouth.     Magnesium Chloride (MAGNESIUM DR PO) Take by mouth. Takes powder daily     MISC NATURAL PRODUCTS PO Take by mouth daily. Keto gummies     Multiple Vitamin (MULTIVITAMIN) tablet Take 1 tablet by mouth daily.     naproxen (NAPROSYN) 500 MG tablet TAKE 1 TABLET BY MOUTH TWICE A DAY FOR 7 DAYS THEN AS NEEDED FOR PAIN *TAKE WITH FOOD* 60 tablet 3   Varenicline Tartrate (TYRVAYA) 0.03 MG/ACT SOLN Place into the nose.     XIIDRA 5 % SOLN Apply 1 drop to eye 2 (two) times daily.     b complex vitamins capsule  Take 1 capsule by mouth daily. 90 capsule 3   metFORMIN (GLUCOPHAGE) 500 MG tablet Take 1 tablet (500 mg total) by mouth daily with breakfast. 90 tablet 3   rosuvastatin (CRESTOR) 10 MG tablet TAKE 1 TABLET BY MOUTH EVERYDAY AT BEDTIME 90 tablet 1   Vitamin D, Ergocalciferol, (DRISDOL) 1.25 MG (50000 UNIT) CAPS capsule TAKE 1 TABLET BY MOUTH ONE TIME PER WEEK 12 capsule 3   No facility-administered medications prior to visit.     Per HPI unless specifically indicated in ROS section below Review of Systems  Constitutional:  Negative for activity change, appetite change, chills, fatigue, fever and unexpected weight change.  HENT:  Negative for hearing loss.   Eyes:  Negative for visual disturbance.  Respiratory:  Negative for cough, chest  tightness, shortness of breath and wheezing.   Cardiovascular:  Negative for chest pain, palpitations and leg swelling.  Gastrointestinal:  Negative for abdominal distention, abdominal pain, blood in stool, constipation, diarrhea, nausea and vomiting.  Genitourinary:  Negative for difficulty urinating and hematuria.  Musculoskeletal:  Negative for arthralgias, myalgias and neck pain.  Skin:  Negative for rash.  Neurological:  Positive for dizziness (mild) and headaches (rare). Negative for seizures and syncope.  Hematological:  Negative for adenopathy. Does not bruise/bleed easily.  Psychiatric/Behavioral:  Negative for dysphoric mood. The patient is not nervous/anxious.    Objective:  BP 120/72    Pulse 80    Temp 97.8 F (36.6 C) (Temporal)    Ht 5\' 3"  (1.6 m)    Wt 234 lb 5 oz (106.3 kg)    SpO2 95%    BMI 41.51 kg/m   Wt Readings from Last 3 Encounters:  07/25/21 234 lb 5 oz (106.3 kg)  07/04/21 230 lb (104.3 kg)  02/01/21 242 lb 3.2 oz (109.9 kg)      Physical Exam Vitals and nursing note reviewed.  Constitutional:      Appearance: Normal appearance. She is not ill-appearing.  HENT:     Head: Normocephalic and atraumatic.     Right Ear: Tympanic membrane, ear canal and external ear normal. There is no impacted cerumen.     Left Ear: Tympanic membrane, ear canal and external ear normal. There is no impacted cerumen.  Eyes:     General:        Right eye: No discharge.        Left eye: No discharge.     Extraocular Movements: Extraocular movements intact.     Conjunctiva/sclera: Conjunctivae normal.     Pupils: Pupils are equal, round, and reactive to light.  Neck:     Thyroid: No thyroid mass or thyromegaly.  Cardiovascular:     Rate and Rhythm: Normal rate and regular rhythm.     Pulses: Normal pulses.     Heart sounds: Normal heart sounds. No murmur heard. Pulmonary:     Effort: Pulmonary effort is normal. No respiratory distress.     Breath sounds: Normal breath  sounds. No wheezing, rhonchi or rales.  Abdominal:     General: Bowel sounds are normal. There is no distension.     Palpations: Abdomen is soft. There is no mass.     Tenderness: There is no abdominal tenderness. There is no guarding or rebound.     Hernia: No hernia is present.  Musculoskeletal:     Cervical back: Normal range of motion and neck supple. No rigidity.     Right lower leg: No edema.     Left  lower leg: No edema.  Lymphadenopathy:     Cervical: No cervical adenopathy.  Skin:    General: Skin is warm and dry.     Findings: No rash.  Neurological:     General: No focal deficit present.     Mental Status: She is alert. Mental status is at baseline.  Psychiatric:        Mood and Affect: Mood normal.        Behavior: Behavior normal.      Results for orders placed or performed in visit on 07/20/21  IBC panel  Result Value Ref Range   Iron 147 (H) 42 - 145 ug/dL   Transferrin 279.0 212.0 - 360.0 mg/dL   Saturation Ratios 37.6 20.0 - 50.0 %   TIBC 390.6 250.0 - 450.0 mcg/dL  CBC with Differential/Platelet  Result Value Ref Range   WBC 7.4 4.0 - 10.5 K/uL   RBC 4.47 3.87 - 5.11 Mil/uL   Hemoglobin 14.4 12.0 - 15.0 g/dL   HCT 44.0 36.0 - 46.0 %   MCV 98.3 78.0 - 100.0 fl   MCHC 32.8 30.0 - 36.0 g/dL   RDW 14.8 11.5 - 15.5 %   Platelets 184.0 150.0 - 400.0 K/uL   Neutrophils Relative % 54.0 43.0 - 77.0 %   Lymphocytes Relative 33.4 12.0 - 46.0 %   Monocytes Relative 9.7 3.0 - 12.0 %   Eosinophils Relative 2.6 0.0 - 5.0 %   Basophils Relative 0.3 0.0 - 3.0 %   Neutro Abs 4.0 1.4 - 7.7 K/uL   Lymphs Abs 2.5 0.7 - 4.0 K/uL   Monocytes Absolute 0.7 0.1 - 1.0 K/uL   Eosinophils Absolute 0.2 0.0 - 0.7 K/uL   Basophils Absolute 0.0 0.0 - 0.1 K/uL  Comprehensive metabolic panel  Result Value Ref Range   Sodium 141 135 - 145 mEq/L   Potassium 4.7 3.5 - 5.1 mEq/L   Chloride 104 96 - 112 mEq/L   CO2 28 19 - 32 mEq/L   Glucose, Bld 89 70 - 99 mg/dL   BUN 24 (H) 6 -  23 mg/dL   Creatinine, Ser 0.83 0.40 - 1.20 mg/dL   Total Bilirubin 0.5 0.2 - 1.2 mg/dL   Alkaline Phosphatase 65 39 - 117 U/L   AST 19 0 - 37 U/L   ALT 21 0 - 35 U/L   Total Protein 6.8 6.0 - 8.3 g/dL   Albumin 4.3 3.5 - 5.2 g/dL   GFR 77.11 >60.00 mL/min   Calcium 9.5 8.4 - 10.5 mg/dL  Hemoglobin A1c  Result Value Ref Range   Hgb A1c MFr Bld 6.3 4.6 - 6.5 %  Lipid panel  Result Value Ref Range   Cholesterol 143 0 - 200 mg/dL   Triglycerides 171.0 (H) 0.0 - 149.0 mg/dL   HDL 57.10 >39.00 mg/dL   VLDL 34.2 0.0 - 40.0 mg/dL   LDL Cholesterol 52 0 - 99 mg/dL   Total CHOL/HDL Ratio 3    NonHDL 86.25   Folate  Result Value Ref Range   Folate >24.2 >5.9 ng/mL  TSH  Result Value Ref Range   TSH 1.99 0.35 - 5.50 uIU/mL  VITAMIN D 25 Hydroxy (Vit-D Deficiency, Fractures)  Result Value Ref Range   VITD 27.91 (L) 30.00 - 100.00 ng/mL  Ferritin  Result Value Ref Range   Ferritin 110.7 10.0 - 291.0 ng/mL  Vitamin B12  Result Value Ref Range   Vitamin B-12 1,102 (H) 211 - 911 pg/mL  Assessment & Plan:  This visit occurred during the SARS-CoV-2 public health emergency.  Safety protocols were in place, including screening questions prior to the visit, additional usage of staff PPE, and extensive cleaning of exam room while observing appropriate contact time as indicated for disinfecting solutions.   Problem List Items Addressed This Visit     Health maintenance examination (Chronic)    Preventative protocols reviewed and updated unless pt declined. Discussed healthy diet and lifestyle.       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 She is interested in retrial wegovy - previously tolerated well and effective. Rx sent for 0.25mg  weekly dose - advised schedule 4-6 wk f/u visit when started.      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 The ASCVD Risk score (Arnett DK, et al., 2019) failed to calculate for the following reasons:   The patient has a prior MI or stroke diagnosis       Relevant Medications   rosuvastatin (CRESTOR) 10 MG tablet   Vitamin D deficiency    Low levels despite weekly replacement - continue this.       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  rec yearly CBC, CMP, Mg, LDH , urate - these will have to be checked next labs.       Prediabetes    Doing well back on metformin.       History of abnormal cervical Pap smear    Established with GYN with recent reassuring pelvic US - appreciate their care.       Osteopenia - Primary    Update DEXA scan.       Relevant Orders   DG Bone Density   Hypothyroidism (acquired)    Stable period off replacement. She is taking biotin.       Low serum vitamin B12    Levels now high - rec drop Bcomplex to MWF dosing.       Other Visit Diagnoses     Need for Tdap vaccination       Relevant Orders   Tdap vaccine greater than or equal to 7yo IM (Completed)   Need for shingles vaccine       Relevant Orders   Varicella-zoster vaccine IM (Completed)        Meds ordered this encounter  Medications   Semaglutide-Weight Management (WEGOVY) 0.25 MG/0.5ML SOAJ    Sig: Inject 0.25 mg into the skin once a week.    Dispense:  2 mL    Refill:  0   b complex vitamins capsule    Sig: Take 1 capsule by mouth every Monday, Wednesday, and Friday.   metFORMIN (GLUCOPHAGE) 500 MG tablet    Sig: Take 1 tablet (500 mg total) by mouth daily with breakfast.    Dispense:  90 tablet    Refill:  3   rosuvastatin (CRESTOR) 10 MG tablet    Sig: TAKE 1 TABLET BY MOUTH EVERYDAY AT BEDTIME    Dispense:  90 tablet    Refill:  3    Vitamin D, Ergocalciferol, (DRISDOL) 1.25 MG (50000 UNIT) CAPS capsule    Sig: TAKE 1 TABLET BY  MOUTH ONE TIME PER WEEK    Dispense:  12 capsule    Refill:  3   Orders Placed This Encounter  Procedures   DG Bone Density    Standing Status:   Future    Standing Expiration Date:   07/25/2022    Order Specific Question:   Reason for Exam (SYMPTOM  OR DIAGNOSIS REQUIRED)    Answer:   osteopenia    Order Specific Question:   Is the patient pregnant?    Answer:   No    Order Specific Question:   Preferred imaging location?    Answer:   [facility]   Tdap vaccine greater than or equal to 7yo IM   Varicella-zoster vaccine IM     Patient instructions: Call to schedule mammogram and bone density at your convenience: [facility] [phone] Second shingrix vaccine today.  Tdap today (tetano y [place]).  Comienza wegovy 0.25mg  semanal - si lo rellena, haga cita en 4-6 semanas para seguimiento.  Regresar en 6 meses para proxima cita.  Follow up plan: Return in about 6 months (around 01/22/2022) for follow up visit.  Ria Bush, MD

## 2021-07-25 NOTE — Assessment & Plan Note (Signed)
Update DEXA scan.  

## 2021-07-25 NOTE — Patient Instructions (Addendum)
Call to schedule mammogram and bone density at your convenience: [facility] at [facility] [phone] Second shingrix vaccine today.  Tdap today (tetano y [place]).  Comienza wegovy 0.25mg  semanal - si lo rellena, haga cita en 4-6 semanas para seguimiento.  Regresar en 6 meses para proxima cita.  [place] Maintenance for Postmenopausal Women La menopausia es un proceso normal en el cual la capacidad de quedar embarazada llega a su fin. Este proceso ocurre lentamente a lo largo de un perodo de muchos meses o aos; por lo general, entre los 28 y los 14 aos. La menopausia es completa cuando no se ha tenido el perodo menstrual por 12 meses. Es importante hablar con el mdico sobre algunas de las enfermedades ms comunes que afectan a las mujeres despus de la menopausia (mujeres posmenopusicas). Estas incluyen la enfermedad cardaca, el cncer y la prdida sea (osteoporosis). Adoptar un estilo de vida saludable y recibir atencin preventiva pueden ayudar a promover la salud y Musician. Las medidas que tome tambin pueden reducir las probabilidades de Actor algunas de estas enfermedades frecuentes. Cules son los signos y sntomas de la menopausia? Durante la menopausia, puede tener los siguientes sntomas: Nurse, learning disability. Estos pueden ser moderados o intensos. Sudoracin nocturna. Disminucin del deseo sexual. Cambios en el estado de nimo. Dolores de [place]. Cansancio (fatiga). Irritabilidad. Problemas de memoria. Problemas para quedarse dormida o para seguir durmiendo. Hable con el mdico sobre las opciones de tratamiento para sus sntomas. Necesito terapia de reemplazo hormonal? La terapia de reemplazo hormonal es eficaz para tratar los sntomas causados por la menopausia, como los acaloramientos y las sudoraciones nocturnas. La reposicin hormonal conlleva ciertos riesgos, especialmente a medida que una mujer  envejece. Si est pensando en usar estrgeno o estrgeno con progestina, analice los beneficios y los riesgos con el mdico. Cmo puedo reducir el riesgo de tener enfermedad cardaca y accidente cerebrovascular? A medida que se envejece, aumenta el riesgo de enfermedad cardaca, infarto de miocardio y accidente cerebrovascular. Una de las causas puede ser un cambio en las hormonas del cuerpo durante la menopausia. Esto puede afectar la forma en que el organismo procesa las [place], los triglicridos y el colesterol de su dieta. El infarto de miocardio y el accidente cerebrovascular son emergencias mdicas. Hay muchas cosas que se pueden hacer para ayudar a prevenir la enfermedad cardaca y el accidente cerebrovascular. Contrlese la presin arterial La hipertensin arterial causa enfermedades cardacas y [place] el riesgo de accidente cerebrovascular. Es ms probable que esto se manifieste en las personas que tienen lecturas de presin arterial alta o tienen sobrepeso. Hgase controlar la presin arterial: Cada 3 a 5 aos si tiene entre 18 y 60 aos. Todos los aos si es mayor de 40 aos. Consuma una dieta saludable  Consuma una dieta que incluya muchas verduras, frutas, productos lcteos con bajo contenido de [place] y Advertising account planner. No consuma muchos alimentos ricos en grasas slidas, azcares agregados o sodio. Haga ejercicio con regularidad Haga ejercicio con regularidad. Esta es una de las prcticas ms importantes que puede hacer por su salud. La mayora de los adultos deben seguir estas pautas: Intente realizar al menos 150 minutos de actividad fsica por semana. El ejercicio debe aumentar la frecuencia cardaca y Nature conservation officer transpirar (ejercicio de intensidad moderada). Intente hacer ejercicios de elongacin por lo menos dos veces por semana. Agrguelos al plan de ejercicio de intensidad moderada. Pase menos tiempo sentada. Incluso la actividad fsica ligera puede ser beneficiosa.  Otros  consejos Trabaje con su mdico para Science writer o Theatre manager un peso saludable. No consuma ningn producto que contenga nicotina o tabaco. Estos productos incluyen cigarrillos, tabaco para Higher education careers adviser y aparatos de vapeo, como los Psychologist, sport and exercise. Si necesita ayuda para dejar de consumir estos productos, consulte al mdico. Conozca sus cifras. Pdale al mdico que le controle el colesterol y el nivel sanguneo de azcar en la sangre (glucosa). Siga hacindose anlisis de [employer] se lo haya indicado el mdico. Necesito realizarme pruebas de deteccin del cncer? Segn sus antecedentes mdicos y familiares, es posible que deba realizarse pruebas de deteccin del cncer en diferentes etapas de la vida. Esto puede incluir pruebas de deteccin de lo siguiente: Cncer de mama. Cncer de cuello uterino. Cncer de pulmn. Cncer colorrectal. Cul es mi riesgo de tener osteoporosis? Despus de la menopausia, puede correr un riesgo ms alto de tener osteoporosis. La osteoporosis es una afeccin en la cual la destruccin de la masa sea ocurre con mayor rapidez que su formacin. Para ayudar a prevenir esta afeccin o las fracturas seas que pueden ocurrir a causa de [place], usted puede tomar las siguientes medidas: Si tiene entre 73 y 1 aos, reciba como mnimo 1000 mg de calcio y 600 unidades internacionales (UI) de vitamina D por Training and development officer. Si tiene ms de 50 aos pero menos de 70 aos, reciba como mnimo 1200 mg de calcio y al menos 600 unidades internacionales (UI) de vitamina D por da. Si tiene ms de 70 aos, reciba como mnimo 1200 mg de calcio y al menos 800 unidades internacionales (UI) de vitamina D por da. Fumar y beber alcohol en exceso aumentan el riesgo de osteoporosis. Consuma alimentos ricos en calcio y vitamina D, y haga ejercicios con soporte de peso varias veces a la [place], como se lo haya indicado el mdico. De qu manera la menopausia afecta mi salud mental? La depresin puede  presentarse a cualquier edad, pero es ms frecuente a medida que una persona envejece. Los sntomas comunes de depresin incluyen lo siguiente: Sensacin de depresin. Cambios en los patrones de sueo. Cambios en el apetito o en los hbitos de alimentacin. Sensacin de falta general de motivacin o placer al [employer] actividades que sola disfrutar. Crisis frecuentes de llanto. Hable con el mdico si cree que est experimentando alguno de estos sntomas. Indicaciones generales Visite a su mdico para hacerse exmenes de bienestar peridicos y aplicarse vacunas. Puede incluir: Programar exmenes peridicos dentales, de la salud y de Public librarian. Recibir y [employer]. Estos incluyen los siguientes: Human resources officer. Aplquese esta vacuna todos los aos antes de que comience la temporada de gripe. Vacuna contra la neumona. Vacuna contra el herpes. Vacuna contra el ttanos, la difteria y la tos Dyann Ruddle (Tdap). El mdico tambin puede recomendarle que se aplique otras vacunas. Notifique a su mdico si alguna vez ha sido vctima de abuso o si no se siente seguro en su hogar. Resumen La menopausia es un proceso normal en el cual la capacidad de quedar embarazada llega a su fin. Esta condicin causa acaloramientos, sudoraciones nocturnas, disminucin del inters en el sexo, cambios en el estado de nimo, dolores de [place] o falta de sueo. El tratamiento de esta afeccin puede incluir una terapia de reemplazo hormonal. Tome medidas para mantenerse 33, entre ellas, hacer ejercicio con regularidad, seguir una dieta saludable, controlar su peso y medirse la presin arterial y los niveles de Dispensing optician. Hgase pruebas para Film/video editor y depresin. Asegrese  [place] con todas las vacunas. Esta informacin no tiene Marine scientist el consejo del mdico. Asegrese de hacerle al mdico cualquier pregunta que tenga. Document Revised: 12/05/2020 Document Reviewed:  12/05/2020 Elsevier Patient Education  [place].

## 2021-07-25 NOTE — Assessment & Plan Note (Signed)
Preventative protocols reviewed and updated unless pt declined. Discussed healthy diet and lifestyle.  

## 2021-07-25 NOTE — Assessment & Plan Note (Signed)
CBC stable.  rec yearly CBC, CMP, Mg, LDH , urate - these will have to be checked next labs.

## 2021-07-25 NOTE — Assessment & Plan Note (Signed)
Levels now high - rec drop Bcomplex to MWF dosing.

## 2021-07-25 NOTE — Assessment & Plan Note (Addendum)
Encouraged ongoing healthy diet and lifestyle choices to affect sustainable weight loss. She is interested in retrial wegovy - previously tolerated well and effective. Rx sent for 0.25mg  weekly dose - advised schedule 4-6 wk f/u visit when started.

## 2021-07-25 NOTE — Assessment & Plan Note (Signed)
Chronic, stable on crestor reviewed diet choices to improve triglycerides. The ASCVD Risk score (Arnett DK, et al., 2019) failed to calculate for the following reasons:   The patient has a prior MI or stroke diagnosis

## 2021-07-25 NOTE — Assessment & Plan Note (Signed)
Doing well back on metformin.

## 2021-07-29 LAB — VITAMIN B1: Vitamin B1 (Thiamine): 14 nmol/L (ref 8–30)

## 2021-08-11 ENCOUNTER — Other Ambulatory Visit: Payer: Self-pay | Admitting: Family Medicine

## 2021-08-13 NOTE — Telephone Encounter (Signed)
Received refill request for topamax, ?automatic refill request.  Topamax was stopped last month and we started wegovy in its place. Did she decided to return to topamax?

## 2021-08-13 NOTE — Telephone Encounter (Signed)
Patient notified as instructed by telephone and verbalized understanding. Patient stated that she did not request the refill for Topamax. Patient stated that she has not started back on the Topamax. Refill should be denied.

## 2021-08-18 ENCOUNTER — Encounter: Payer: Self-pay | Admitting: Radiology

## 2021-09-12 ENCOUNTER — Other Ambulatory Visit: Payer: Self-pay | Admitting: Family Medicine

## 2021-09-12 NOTE — Telephone Encounter (Signed)
Refill request Naprosyn ?Last refill 05/09/21 #60/3 ?Last office visit 07/25/21 ?

## 2021-10-16 ENCOUNTER — Ambulatory Visit (INDEPENDENT_AMBULATORY_CARE_PROVIDER_SITE_OTHER): Payer: 59 | Admitting: Obstetrics & Gynecology

## 2021-10-16 ENCOUNTER — Encounter: Payer: Self-pay | Admitting: Obstetrics & Gynecology

## 2021-10-16 VITALS — BP 116/79 | HR 71 | Ht 62.0 in | Wt 241.0 lb

## 2021-10-16 DIAGNOSIS — Z01419 Encounter for gynecological examination (general) (routine) without abnormal findings: Secondary | ICD-10-CM | POA: Diagnosis not present

## 2021-10-16 DIAGNOSIS — Z1231 Encounter for screening mammogram for malignant neoplasm of breast: Secondary | ICD-10-CM

## 2021-10-16 DIAGNOSIS — N952 Postmenopausal atrophic vaginitis: Secondary | ICD-10-CM

## 2021-10-16 MED ORDER — ESTRADIOL 0.1 MG/GM VA CREA: 0.5000 | TOPICAL_CREAM | Freq: Every day | VAGINAL | 10 refills | Status: DC

## 2021-10-16 MED ORDER — HYALO GYN VA GEL: VAGINAL | 3 refills | Status: DC

## 2021-10-16 NOTE — Progress Notes (Signed)
? ? ?GYNECOLOGY ANNUAL PREVENTATIVE CARE ENCOUNTER NOTE ? ?History:    ? Stacey Nichols is a 61 y.o. [id number] female here for a routine annual gynecologic exam.  Patient is Spanish-speaking only, interpreter present for this encounter. Current complaints: continue vaginal dryness, feels it is not better on vaginal estrogen.   Denies abnormal vaginal bleeding, discharge, pelvic pain, problems with intercourse or other gynecologic concerns.  ?  ?Gynecologic History ?No LMP recorded. Patient has had a hysterectomy. ?Last Mammogram: 2021.  Result was normal ?Last Colonoscopy: 09/19/2020.  Result was normal ? ?Obstetric History ?OB History  ?Gravida Para Term Preterm AB Living  ?'2 2 2     2  '$ ?SAB IAB Ectopic Multiple Live Births  ?           ?  ?# Outcome Date GA Lbr Len/2nd Weight Sex Delivery Anes PTL Lv  ?2 Term           ?1 Term           ? ? ?Past Medical History:  ?Diagnosis Date  ? 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 s/p chemo (Dr Lissa Merlin)  ? Anemia   ? hx of  ? Anxiety   ? Arthritis   ? severe  ? Constipation   ? Depression   ? Diabetes mellitus without complication ([place])   ? Fatty liver   ? GERD (gastroesophageal reflux disease)   ? H/O Bell's palsy   ? H/O hiatal hernia   ? Headache   ? occasional  ? High triglycerides   ? History of cancer chemotherapy   ? Leukemia ([place])   ? Morbidly obese ([place])   ? Osteopenia 06/08/2017  ? DEXA 2017 - T -1.8 spine  ? Pneumonia   ? hx of   ? Rheumatoid arthritis ([place])   ? Sleep apnea   ? Stroke [facility])   ? "mini stroke-caused bells palsy for 1 month"  ? ? ?Past Surgical History:  ?Procedure Laterality Date  ? ABDOMINAL HYSTERECTOMY  2001  ? heavy bleeding, fibroids, 1 ovary remains  ? BLEPHAROPLASTY Bilateral 06/2020  ? in [place]  ? COLONOSCOPY WITH PROPOFOL N/A 09/19/2020  ? TA, int hem, rpt 7 yrs Allen Norris, Darren, MD)  ? FACIAL COSMETIC SURGERY  06/2020  ? in [place]  ? LAPAROSCOPIC GASTRIC BAND REMOVAL WITH LAPAROSCOPIC GASTRIC SLEEVE RESECTION  04/26/2014  ? LAPAROSCOPIC  GASTRIC BANDING  2000  ? performed in [place]  ? REPLACEMENT TOTAL KNEE Left 10/2017  ? Dr [facility]  ? REPLACEMENT TOTAL KNEE Right 06/2018  ? Dr Joya [facility]  ? TUBAL LIGATION  1996  ? UPPER GI ENDOSCOPY N/A 04/26/2014  ? Procedure: UPPER GI ENDOSCOPY;  Surgeon: Pedro Earls, MD;  Location: [facility];  Service: General;  Laterality: N/A;  ? ? ?Current Outpatient Medications on File Prior to Visit  ?Medication Sig Dispense Refill  ? acetaminophen (TYLENOL) 500 MG tablet Take 500 mg by mouth every 6 (six) hours as needed.    ? aspirin 81 MG tablet Take 81 mg by mouth daily.    ? b complex vitamins capsule Take 1 capsule by mouth every Monday, Wednesday, and Friday.    ? Biotin 2500 MCG CAPS Take by mouth. Takes 2 chews daily    ? Ibuprofen (ADVIL PO) Take by mouth.    ? Magnesium Chloride (MAGNESIUM DR PO) Take by mouth. Takes powder daily    ? metFORMIN (GLUCOPHAGE) 500 MG tablet Take 1 tablet (500  mg total) by mouth daily with breakfast. 90 tablet 3  ? MISC NATURAL PRODUCTS PO Take by mouth daily. Keto gummies    ? Multiple Vitamin (MULTIVITAMIN) tablet Take 1 tablet by mouth daily.    ? naproxen (NAPROSYN) 500 MG tablet TAKE 1 TABLET BY MOUTH TWICE A DAY FOR 7 DAYS THEN AS NEEDED FOR PAIN *TAKE WITH FOOD* 60 tablet 3  ? rosuvastatin (CRESTOR) 10 MG tablet TAKE 1 TABLET BY MOUTH EVERYDAY AT BEDTIME 90 tablet 3  ? Varenicline Tartrate (TYRVAYA) 0.03 MG/ACT SOLN Place into the nose.    ? Vitamin D, Ergocalciferol, (DRISDOL) 1.25 MG (50000 UNIT) CAPS capsule TAKE 1 TABLET BY MOUTH ONE TIME PER WEEK 12 capsule 3  ? XIIDRA 5 % SOLN Apply 1 drop to eye 2 (two) times daily.    ? Semaglutide-Weight Management (WEGOVY) 0.25 MG/0.5ML SOAJ Inject 0.25 mg into the skin once a week. (Patient not taking: Reported on 10/16/2021) 2 mL 0  ? ?No current facility-administered medications on file prior to visit.  ? ? ?Allergies  ?Allergen Reactions  ? Latex Other (See Comments) and Itching  ? Sulfa Antibiotics Rash  ?   [Rash] rash - paper tape OK  ? Tape Rash  ?  [Rash] rash - paper tape OK  ? ? ?Social History:  reports that she quit smoking about 3 years ago. Her smoking use included cigarettes. She has never used smokeless tobacco. She reports current alcohol use. She reports that she does not use drugs. ? ?Family History  ?Problem Relation Age of Onset  ? Cancer Mother   ?     cervix  ? Hyperlipidemia Father   ? Obesity Father   ? CAD Father   ?     MI and arrhythmia  ? Heart Problems Father   ? Cancer Sister   ?     metastatic, ?pancrease primary  ? Diabetes Maternal Grandfather   ? Breast cancer Neg Hx   ? ? ?The following portions of the patient's history were reviewed and updated as appropriate: allergies, current medications, past family history, past medical history, past social history, past surgical history and problem list. ? ?Review of Systems ?Pertinent items noted in HPI and remainder of comprehensive ROS otherwise negative. ? ?Physical Exam:  ?BP 116/79 (BP Location: Left Arm, Patient Position: Sitting, Cuff Size: Normal)   Pulse 71   Ht '5\' 2"'$  (1.575 m)   Wt 241 lb (109.3 kg)   BMI 44.08 kg/m?  ?CONSTITUTIONAL: Well-developed, well-nourished female in no acute distress.  ?HENT:  Normocephalic, atraumatic, External right and left ear normal.  ?EYES: Conjunctivae and EOM are normal. Pupils are equal, round, and reactive to light. No scleral icterus.  ?NECK: Normal range of motion, supple, no masses.  Normal thyroid.  ?SKIN: Skin is warm and dry. No rash noted. Not diaphoretic. No erythema. No pallor. ?MUSCULOSKELETAL: Normal range of motion. No tenderness.  No cyanosis, clubbing, or edema. ?NEUROLOGIC: Alert and oriented to person, place, and time. Normal reflexes, muscle tone coordination.  ?PSYCHIATRIC: Normal mood and affect. Normal behavior. Normal judgment and thought content. ?CARDIOVASCULAR: Normal heart rate noted, regular rhythm ?RESPIRATORY: Clear to auscultation bilaterally. Effort and breath sounds  normal, no problems with respiration noted. ?BREASTS: Symmetric in size. Mild diffuse tenderness bilaterally. No masses, skin changes, nipple drainage, or lymphadenopathy bilaterally. Performed in the presence of a chaperone. ?ABDOMEN: Soft, obese, no distention noted.  No tenderness, rebound or guarding.  ?PELVIC: : Mildly atrophic external genitalia, urethral meatus,  distal vaginal mucosa. Improved ruggae noted in distal vagina. No loss of architecture, no lesions.  No abnormal discharge noted. Speculum exam deferred. Performed in the presence of a chaperone. ?  ?Assessment and Plan:  ?  1. Vaginal atrophy ?Reassuring examination but patient still feels very dry.  Not a candidate for Osphena given history of stroke.  Discussed use of vaginal moisturizers (hyaluronic acid), recommended Hyalo GYN or Revaree in addition to estrogen therapy.  Continue to monitor effect. ?- Vaginal Lubricant (HYALO GYN) GEL; Apply as needed  Dispense: 30 g; Refill: 3 ?- estradiol (ESTRACE VAGINAL) 0.1 MG/GM vaginal cream; Place 0.5 Applicatorfuls vaginally daily. For two weeks, then use three times a week  Dispense: 42.5 g; Refill: 10 ? ?2. Breast cancer screening by mammogram ?Mammogram to be scheduled for patient. ?- MM 3D SCREEN BREAST BILATERAL; Future ? ?3. Well woman exam with routine gynecological exam ?She is s/p  hysterectomy was done for benign indications, there is no reason for any pap smear screening ?Colon cancer screening is up to date. ?Follow up with PCP and other specialists for other issues. ?Routine preventative health maintenance measures emphasized. ?Please refer to After Visit Summary for other counseling recommendations.  ?   ? ?Verita Schneiders, MD, FACOG ?Obstetrician Social research officer, government, Faculty Practice ?[facility] [place] [facility] ? ?

## 2021-10-16 NOTE — Patient Instructions (Addendum)
Consider  Hyalo - GYN or Revaree Hyaluronic vaginal moisturizers. To be used in combination with vaginal estrogen. ?

## 2022-01-22 ENCOUNTER — Ambulatory Visit: Payer: 59 | Admitting: Family Medicine

## 2022-01-29 ENCOUNTER — Ambulatory Visit (INDEPENDENT_AMBULATORY_CARE_PROVIDER_SITE_OTHER): Payer: 59 | Admitting: Family Medicine

## 2022-01-29 ENCOUNTER — Other Ambulatory Visit: Payer: Self-pay | Admitting: Family Medicine

## 2022-01-29 ENCOUNTER — Encounter: Payer: Self-pay | Admitting: Family Medicine

## 2022-01-29 DIAGNOSIS — R0789 Other chest pain: Secondary | ICD-10-CM | POA: Diagnosis not present

## 2022-01-29 DIAGNOSIS — Z9884 Bariatric surgery status: Secondary | ICD-10-CM | POA: Diagnosis not present

## 2022-01-29 DIAGNOSIS — R7303 Prediabetes: Secondary | ICD-10-CM

## 2022-01-29 DIAGNOSIS — G4700 Insomnia, unspecified: Secondary | ICD-10-CM

## 2022-01-29 DIAGNOSIS — I10 Essential (primary) hypertension: Secondary | ICD-10-CM

## 2022-01-29 DIAGNOSIS — G4733 Obstructive sleep apnea (adult) (pediatric): Secondary | ICD-10-CM

## 2022-01-29 LAB — POCT GLYCOSYLATED HEMOGLOBIN (HGB A1C): Hemoglobin A1C: 6.2 % — AB (ref 4.0–5.6)

## 2022-01-29 MED ORDER — VITAMIN D3 1.25 MG (50000 UT) PO TABS: 1.0000 | ORAL_TABLET | ORAL | 3 refills | Status: DC

## 2022-01-29 MED ORDER — TRAZODONE HCL 50 MG PO TABS: 25.0000 mg | ORAL_TABLET | Freq: Every evening | ORAL | 3 refills | Status: DC | PRN

## 2022-01-29 NOTE — Patient Instructions (Addendum)
EKG today  A1c today  [place] [facility] [phone] para hacer cita para mamograma y densitometria.  Trate trazodone 25-'50mg'$  por la noche.  Pare metformina, regresar en 3 meses para proxima visita.   Meta de azucar en ayunas: 80-120 Meta de azucar 2 hors despues de comer: <180.   Higiene de sueo: 1. [place] 2. Evite los estimulantes como la cafena y la nicotina. Evite el alcohol a la hora de Acupuncturist (puede acelerar el inicio del sueo, pero el metabolismo del cuerpo puede causar que se despierte). 3. Todas las formas de ejercicio ayudan a garantizar un sueo profundo: limite el ejercicio vigoroso a Futures trader o al final de la tarde 4. Evite los alimentos demasiado cerca de la hora de [place], incluido el chocolate (que contiene cafena) 5. Absorbe la luz natural 6. Establezca una rutina regular a la hora de [place]. 7. Asocie la cama con el sueo: evite la televisin, la computadora o el telfono, o leer mucho mientras est en la cama. 8. Asegure un ambiente agradable, fresco y relajante para dormir: una habitacin tranquila, English as a second language teacher.

## 2022-01-29 NOTE — Progress Notes (Unsigned)
Patient ID: Stacey Nichols, female    DOB: 1961/04/22, 61 y.o.   MRN: [id number]  This visit was conducted in person.  BP 124/82   Pulse 83   Temp (!) 97.5 F (36.4 C) (Temporal)   Ht '5\' 2"'$  (1.575 m)   Wt 245 lb 8 oz (111.4 kg)   SpO2 95%   BMI 44.90 kg/m    CC: 6 mo f/u visit  Subjective:   HPI: Stacey Nichols is a 61 y.o. female presenting on 01/29/2022 for Follow-up (Here for 6 mo f/u.)   Continues struggling with weight loss.   Activity: Walks 5d/wk 35-40 min.  Feet hurt - takes naprosyn '500mg'$  daily for ongoing joint pains.   Diet: 2pm lunch - 1/4 avocado, 2 eggs, bread, coffee with milk and monkfruit  5:30pm 1/4 cup nuts  8:30pm dinner - beef steak, cabbage salad, 1/2 avocado, water  Wegovy - difficulty finding and covering. Topamax - caused insomnia.   Recent travel [place] (08/2021), then [place] (12/2021), then [place] (01/2022).  Notes exertional dyspnea with walking, occasional pressure even prior to travel.  04/2020 underwent cardiology eval by Dr Phillis Knack reassuringly normal echo and Lexiscan (low risk study). Continues aspirin daily and crestor '10mg'$  daily. Known sleep apnea - has CPAP at home, uses intermittently. This is followed by Dr Manuella Ghazi neurology.   Insomnia - ongoing over the past 3 months. Trouble falling and staying asleep. Doesn't have bedtime routine.   Prediabetes - continues metformin '500mg'$  daily.      Relevant past medical, surgical, family and social history reviewed and updated as indicated. Interim medical history since our last visit reviewed. Allergies and medications reviewed and updated. Outpatient Medications Prior to Visit  Medication Sig Dispense Refill   acetaminophen (TYLENOL) 500 MG tablet Take 500 mg by mouth every 6 (six) hours as needed.     aspirin 81 MG tablet Take 81 mg by mouth daily.     b complex vitamins capsule Take 1 capsule by mouth every Monday, Wednesday, and Friday.     Biotin 2500 MCG CAPS Take by mouth.  Takes 2 chews daily     Ibuprofen (ADVIL PO) Take by mouth.     Magnesium Chloride (MAGNESIUM DR PO) Take by mouth. Takes powder daily     metFORMIN (GLUCOPHAGE) 500 MG tablet Take 1 tablet (500 mg total) by mouth daily with breakfast. 90 tablet 3   MISC NATURAL PRODUCTS PO Take by mouth daily. Keto gummies     Multiple Vitamin (MULTIVITAMIN) tablet Take 1 tablet by mouth daily.     naproxen (NAPROSYN) 500 MG tablet TAKE 1 TABLET BY MOUTH TWICE A DAY FOR 7 DAYS THEN AS NEEDED FOR PAIN *TAKE WITH FOOD* 60 tablet 3   rosuvastatin (CRESTOR) 10 MG tablet TAKE 1 TABLET BY MOUTH EVERYDAY AT BEDTIME 90 tablet 3   Semaglutide-Weight Management (WEGOVY) 0.25 MG/0.5ML SOAJ Inject 0.25 mg into the skin once a week. (Patient not taking: Reported on 10/16/2021) 2 mL 0   estradiol (ESTRACE VAGINAL) 0.1 MG/GM vaginal cream Place 0.5 Applicatorfuls vaginally daily. For two weeks, then use three times a week 42.5 g 10   Vaginal Lubricant (HYALO GYN) GEL Apply as needed 30 g 3   Varenicline Tartrate (TYRVAYA) 0.03 MG/ACT SOLN Place into the nose.     Vitamin D, Ergocalciferol, (DRISDOL) 1.25 MG (50000 UNIT) CAPS capsule TAKE 1 TABLET BY MOUTH ONE TIME PER WEEK 12 capsule 3   XIIDRA 5 % SOLN Apply 1  drop to eye 2 (two) times daily.     No facility-administered medications prior to visit.     Per HPI unless specifically indicated in ROS section below Review of Systems  Objective:  BP 124/82   Pulse 83   Temp (!) 97.5 F (36.4 C) (Temporal)   Ht '5\' 2"'$  (1.575 m)   Wt 245 lb 8 oz (111.4 kg)   SpO2 95%   BMI 44.90 kg/m   Wt Readings from Last 3 Encounters:  01/29/22 245 lb 8 oz (111.4 kg)  10/16/21 241 lb (109.3 kg)  07/25/21 234 lb 5 oz (106.3 kg)      Physical Exam Vitals and nursing note reviewed.  Constitutional:      Appearance: Normal appearance. She is not ill-appearing.  HENT:     Mouth/Throat:     Mouth: Mucous membranes are moist.     Pharynx: Oropharynx is clear. No oropharyngeal exudate  or posterior oropharyngeal erythema.  Eyes:     Extraocular Movements: Extraocular movements intact.     Pupils: Pupils are equal, round, and reactive to light.  Cardiovascular:     Rate and Rhythm: Normal rate and regular rhythm.     Pulses: Normal pulses.     Heart sounds: Normal heart sounds. No murmur heard. Pulmonary:     Effort: Pulmonary effort is normal. No respiratory distress.     Breath sounds: Normal breath sounds. No wheezing, rhonchi or rales.  Musculoskeletal:     Right lower leg: No edema.     Left lower leg: No edema.  Skin:    General: Skin is warm and dry.     Findings: No rash.  Neurological:     Mental Status: She is alert.  Psychiatric:        Mood and Affect: Mood normal.        Behavior: Behavior normal.       Results for orders placed or performed in visit on 01/29/22  POCT glycosylated hemoglobin (Hb A1C)  Result Value Ref Range   Hemoglobin A1C 6.2 (A) 4.0 - 5.6 %   HbA1c POC (<> result, manual entry)     HbA1c, POC (prediabetic range)     HbA1c, POC (controlled diabetic range)     Lab Results  Component Value Date   TSH 1.99 07/20/2021    EKG - NSR rate 70, normal axis, intervals, no hypertrophy or acute ST/T changes, poor R wave progression, low amplitude throughout.   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  Will stop metformin and if A1c increases, consider restarting + GLP1RA vs Mounjaro. Caution in h/o 2 bariatric surgeries.       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 Caution in h/o 2 bariatric surgeries.        Relevant Orders   POCT glycosylated hemoglobin (Hb A1C) (Completed)   Basic metabolic panel   Chest pressure    Update EKG today.  I will ask her to return for D dimer in h/o recent extensive travel.  Relevant Orders   EKG 12-Lead (Completed)   D-dimer, quantitative   Basic metabolic panel   Insomnia    Sleep hygiene measures reviewed. Trial trazodone 25-'50mg'$  PRN sleep.         Meds ordered this encounter  Medications   Cholecalciferol (VITAMIN D3) 1.25 MG (50000 UT) TABS    Sig: Take 1 tablet by mouth once a week.    Dispense:  12 tablet    Refill:  3   traZODone (DESYREL) 50 MG tablet    Sig: Take 0.5-1 tablets (25-50 mg total) by mouth at bedtime as needed for sleep.    Dispense:  30 tablet    Refill:  3   Orders Placed This Encounter  Procedures   D-dimer, quantitative    Standing Status:   Future    Standing Expiration Date:   10/03/252   Basic metabolic panel    Standing Status:   Future    Standing Expiration Date:   01/31/2023   POCT glycosylated hemoglobin (Hb A1C)   EKG 12-Lead     Patient Instructions  EKG today  A1c today  [facility] at [facility] [phone] para hacer cita para mamograma y densitometria.  Trate trazodone 25-'50mg'$  por la noche.  Pare metformina, regresar en 3 meses para proxima visita.   Meta de azucar en ayunas: 80-120 Meta de azucar 2 hors despues de comer: <180.   Higiene de sueo: 1. Atkins 2. Evite los estimulantes como la cafena y la nicotina. Evite el alcohol a la hora de Acupuncturist (puede acelerar el inicio del sueo, pero el metabolismo del cuerpo puede causar que se despierte). 3. Todas las formas de ejercicio ayudan a garantizar un sueo profundo: limite el ejercicio vigoroso a Futures trader o al final de la tarde 4. Evite los alimentos demasiado cerca de la hora de Sanders, incluido el chocolate (que contiene cafena) 5. Absorbe la luz natural 6. Establezca una rutina  regular a la hora de [place]. 7. Asocie la cama con el sueo: evite la televisin, la computadora o el telfono, o leer mucho mientras est en la cama. 8. Asegure un ambiente agradable, fresco y relajante para dormir: una habitacin tranquila, English as a second language teacher.   Follow up plan: Return in about 3 months (around 05/01/2022), or if symptoms worsen or fail to improve, for follow up visit.  Ria Bush, MD

## 2022-01-29 NOTE — Telephone Encounter (Signed)
Message from phamacy: Alternative Requested:DO NOT HAVE VITAMIN D3 50,000 UNITS. CAN WE FILL AS VITAMIN D2 50,000 UNITS?

## 2022-01-30 DIAGNOSIS — G4700 Insomnia, unspecified: Secondary | ICD-10-CM | POA: Insufficient documentation

## 2022-01-30 MED ORDER — TRAZODONE HCL 50 MG PO TABS: 25.0000 mg | ORAL_TABLET | Freq: Every evening | ORAL | 3 refills | Status: DC | PRN

## 2022-01-30 NOTE — Telephone Encounter (Signed)
Spoke with pt scheduling lab visit on 01/31/22 at 9:05.

## 2022-01-30 NOTE — Assessment & Plan Note (Signed)
Sleep hygiene measures reviewed. Trial trazodone 25-'50mg'$  PRN sleep.

## 2022-01-30 NOTE — Assessment & Plan Note (Signed)
Followed by neurology.   

## 2022-01-30 NOTE — Assessment & Plan Note (Signed)
Update EKG today.  I will ask her to return for D dimer in h/o recent extensive travel.

## 2022-01-30 NOTE — Telephone Encounter (Signed)
Please call and schedule labs for D dimer testing.

## 2022-01-30 NOTE — Assessment & Plan Note (Addendum)
Mancel Parsons previously beneficial but expensive then unavailable due to nationwide shortage.  Will stop metformin and if A1c increases, consider restarting + GLP1RA vs Mounjaro. Caution in h/o 2 bariatric surgeries.

## 2022-01-30 NOTE — Assessment & Plan Note (Signed)
Will stop metformin and if A1c increases, consider restarting + GLP1RA vs Mounjaro. Caution in h/o 2 bariatric surgeries.

## 2022-01-31 ENCOUNTER — Other Ambulatory Visit (INDEPENDENT_AMBULATORY_CARE_PROVIDER_SITE_OTHER): Payer: 59

## 2022-01-31 DIAGNOSIS — R0789 Other chest pain: Secondary | ICD-10-CM

## 2022-01-31 DIAGNOSIS — R7303 Prediabetes: Secondary | ICD-10-CM | POA: Diagnosis not present

## 2022-01-31 LAB — BASIC METABOLIC PANEL
BUN: 18 mg/dL (ref 6–23)
CO2: 29 mEq/L (ref 19–32)
Calcium: 8.8 mg/dL (ref 8.4–10.5)
Chloride: 107 mEq/L (ref 96–112)
Creatinine, Ser: 0.69 mg/dL (ref 0.40–1.20)
GFR: 94.57 mL/min (ref >60.00)
Glucose, Bld: 103 mg/dL — ABNORMAL HIGH (ref 70–99)
Potassium: 4.2 mEq/L (ref 3.5–5.1)
Sodium: 142 mEq/L (ref 135–145)

## 2022-02-01 ENCOUNTER — Telehealth: Payer: Self-pay

## 2022-02-01 DIAGNOSIS — R079 Chest pain, unspecified: Secondary | ICD-10-CM

## 2022-02-01 LAB — D-DIMER, QUANTITATIVE: D-Dimer, Quant: 0.74 mcg/mL FEU — ABNORMAL HIGH (ref <0.50)

## 2022-02-01 NOTE — Telephone Encounter (Signed)
Report taken to DR G and lisa CMA and is in epic also.

## 2022-02-01 NOTE — Telephone Encounter (Signed)
[place] Night - Client TELEPHONE ADVICE RECORD [facility] Patient Name: Stacey Nichols Gender: Female DOB: 08-03-1960 Age: 61 Y 22 D Return Phone Number: [phone] (Primary) Address: City/ State/ Zip: [place] Client [place] Night - Client Client Site [place] Provider Ria Bush - MD Contact Type Call Who Is Calling Lab / Radiology Lab Name [facility] Phone Number [phone] Lab Tech Name Mellody Drown Reference Number [id number] W Chief Complaint Lab Result (Critical or Stat) Call Type Lab Send to RN Reason for Call Report lab results Initial Comment Lattie Haw is from [facility] with critical results. Translation No Nurse Assessment Nurse: Acey Lav, RN, Estill Bamberg Date/Time (Eastern Time): 02/01/2022 3:45:28 AM Is there an on-call provider listed? ---Yes Please list name of person reporting value (Lab Employee) and a contact number. ---[facility] at [phone] Spoke to: Racquel Please document the following items: Lab name Lab value (read back to lab to verify) Reference range for lab value Date and time blood was drawn Collect time of birth for bilirubin results ---Critical Lab: D Dimer Value: 0.74 high Range: less than 0.50 Collected on: 01/31/2022 at 921am Please collect the patient contact information from the lab. (name, phone number and address) ---Patient's phone number: [phone] List any special notes provided by lab. ---Previous lab: no previous lab Disp. Time Eilene Ghazi Time) Disposition Final User 02/01/2022 3:48:12 AM Called On-Call Provider Acey Lav, RN, Estill Bamberg 02/01/2022 3:48:56 AM Clinical Call Yes Acey Lav, RN, Estill Bamberg Final Disposition 02/01/2022 3:48:56 AM Clinical Call Yes Acey Lav, RN, Evangeline Gula NOTE: All timestamps contained within this report are represented as [place] Standard Time. CONFIDENTIALTY NOTICE: This fax transmission is intended only for the addressee. It contains  information that is legally privileged, confidential or otherwise protected from use or disclosure. If you are not the intended recipient, you are strictly prohibited from reviewing, disclosing, copying using or disseminating any of this information or taking any action in reliance on or regarding this information. If you have received this fax in error, please notify us immediately by telephone so that we can arrange for its return to [place]. Phone: [phone], Toll-Free: [phone], Fax: [phone] Page: 2 of 2 Call Id: [id number] Paging DoctorName Phone DateTime Result/ Outcome Message Type Notes Cathlean Cower- MD [phone] 02/01/2022 3:48:12 AM Called On Call Provider - Reached Doctor Paged Cathlean Cower- MD 02/01/2022 3:48:29 AM Spoke with On Call - General Message Result lab noted by on call, no furthe

## 2022-02-01 NOTE — Telephone Encounter (Signed)
Spoke with pt relaying Dr. G's message. Pt verbalizes understanding.  

## 2022-02-01 NOTE — Telephone Encounter (Signed)
Plz notify pt D dimer returned elevated. Other labs returned ok including kidney function. Recommend next step is CTA chest to further evaluation dyspnea after recent travel.  I have ordered.

## 2022-02-01 NOTE — Telephone Encounter (Signed)
See notes in referral/order - Auth form filled out and has been faxed to [facility] insurance.   Awaiting approval information   FYI

## 2022-02-05 NOTE — Telephone Encounter (Signed)
It's been 4 days and still no response from insurance.  We call and check on status?

## 2022-02-06 NOTE — Telephone Encounter (Signed)
We completed this precert on Thursday or Friday last week and should have a fax returned with status update. I do not know how to search [employer] electronic faxing for specific patients or documents - not sure if you can even do that. I am working with Amy/Sophi on trying to help me locate this fax.  The patient is still needing to be scheduled for the  STAT CT Angio Chest PE - but cannot schedule until we have insurance approval. Its Friday Health Plan Insurance and I cannot get through to a live person on the phone to follow up on the precert. I am stuck on trying to find the status report. I have sat on hold for a very long time every time I have called with no success at reaching a Live Person.  I am still working on this

## 2022-02-08 ENCOUNTER — Ambulatory Visit
Admission: RE | Admit: 2022-02-08 | Discharge: 2022-02-08 | Disposition: A | Discharge status: Home / Self Care | Payer: 59 | Source: Ambulatory Visit | Attending: Family Medicine | Admitting: Family Medicine

## 2022-02-08 DIAGNOSIS — R079 Chest pain, unspecified: Secondary | ICD-10-CM | POA: Diagnosis present

## 2022-02-08 MED ORDER — IOHEXOL 350 MG/ML SOLN
75.0000 mL | Freq: Once | INTRAVENOUS | Status: AC | PRN
  Administered 2022-02-08: 75 mL via INTRAVENOUS

## 2022-02-09 ENCOUNTER — Other Ambulatory Visit: Payer: Self-pay | Admitting: Family Medicine

## 2022-02-11 NOTE — Telephone Encounter (Signed)
Naproxen Last filled:  01/13/22, #60 Last OV:  01/29/22, wt mgmt f/u Next OV:  05/10/22, 3 mo f/u

## 2022-02-13 ENCOUNTER — Emergency Department
Admission: EM | Admit: 2022-02-13 | Discharge: 2022-02-14 | Disposition: A | Discharge status: Home / Self Care | Payer: 59 | Attending: Emergency Medicine | Admitting: Emergency Medicine

## 2022-02-13 ENCOUNTER — Encounter: Payer: Self-pay | Admitting: Obstetrics & Gynecology

## 2022-02-13 ENCOUNTER — Emergency Department: Payer: 59

## 2022-02-13 ENCOUNTER — Other Ambulatory Visit: Payer: Self-pay

## 2022-02-13 DIAGNOSIS — M25561 Pain in right knee: Secondary | ICD-10-CM | POA: Diagnosis not present

## 2022-02-13 DIAGNOSIS — R6889 Other general symptoms and signs: Secondary | ICD-10-CM

## 2022-02-13 DIAGNOSIS — Z7982 Long term (current) use of aspirin: Secondary | ICD-10-CM | POA: Insufficient documentation

## 2022-02-13 DIAGNOSIS — R531 Weakness: Secondary | ICD-10-CM | POA: Insufficient documentation

## 2022-02-13 DIAGNOSIS — Z91040 Latex allergy status: Secondary | ICD-10-CM | POA: Insufficient documentation

## 2022-02-13 DIAGNOSIS — M791 Myalgia, unspecified site: Secondary | ICD-10-CM | POA: Insufficient documentation

## 2022-02-13 DIAGNOSIS — Z20822 Contact with and (suspected) exposure to covid-19: Secondary | ICD-10-CM | POA: Insufficient documentation

## 2022-02-13 DIAGNOSIS — M25562 Pain in left knee: Secondary | ICD-10-CM | POA: Insufficient documentation

## 2022-02-13 DIAGNOSIS — R000 Tachycardia, unspecified: Secondary | ICD-10-CM | POA: Diagnosis not present

## 2022-02-13 DIAGNOSIS — R5383 Other fatigue: Secondary | ICD-10-CM | POA: Insufficient documentation

## 2022-02-13 DIAGNOSIS — R509 Fever, unspecified: Secondary | ICD-10-CM

## 2022-02-13 DIAGNOSIS — M25362 Other instability, left knee: Secondary | ICD-10-CM | POA: Diagnosis not present

## 2022-02-13 DIAGNOSIS — N3000 Acute cystitis without hematuria: Secondary | ICD-10-CM

## 2022-02-13 DIAGNOSIS — R52 Pain, unspecified: Secondary | ICD-10-CM

## 2022-02-13 LAB — COMPREHENSIVE METABOLIC PANEL
ALT: 24 U/L (ref 0–44)
AST: 28 U/L (ref 15–41)
Albumin: 4.3 g/dL (ref 3.5–5.0)
Alkaline Phosphatase: 58 U/L (ref 38–126)
Anion gap: 8 (ref 5–15)
BUN: 21 mg/dL — ABNORMAL HIGH (ref 6–20)
CO2: 22 mmol/L (ref 22–32)
Calcium: 9.5 mg/dL (ref 8.9–10.3)
Chloride: 109 mmol/L (ref 98–111)
Creatinine, Ser: 0.79 mg/dL (ref 0.44–1.00)
GFR, Estimated: >60 mL/min (ref >60)
Glucose, Bld: 123 mg/dL — ABNORMAL HIGH (ref 70–99)
Potassium: 4 mmol/L (ref 3.5–5.1)
Sodium: 139 mmol/L (ref 135–145)
Total Bilirubin: 1.2 mg/dL (ref 0.3–1.2)
Total Protein: 7.5 g/dL (ref 6.5–8.1)

## 2022-02-13 LAB — URINALYSIS, ROUTINE W REFLEX MICROSCOPIC
Bilirubin Urine: NEGATIVE
Glucose, UA: NEGATIVE mg/dL
Hgb urine dipstick: NEGATIVE
Ketones, ur: NEGATIVE mg/dL
Nitrite: NEGATIVE
Protein, ur: NEGATIVE mg/dL
Specific Gravity, Urine: 1.025 (ref 1.005–1.030)
pH: 5 (ref 5.0–8.0)

## 2022-02-13 LAB — CBC WITH DIFFERENTIAL/PLATELET
Abs Immature Granulocytes: 0.07 10*3/uL (ref 0.00–0.07)
Basophils Absolute: 0 10*3/uL (ref 0.0–0.1)
Basophils Relative: 0 %
Eosinophils Absolute: 0 10*3/uL (ref 0.0–0.5)
Eosinophils Relative: 0 %
HCT: 46.1 % — ABNORMAL HIGH (ref 36.0–46.0)
Hemoglobin: 15.1 g/dL — ABNORMAL HIGH (ref 12.0–15.0)
Immature Granulocytes: 0 %
Lymphocytes Relative: 6 %
Lymphs Abs: 0.9 10*3/uL (ref 0.7–4.0)
MCH: 32.1 pg (ref 26.0–34.0)
MCHC: 32.8 g/dL (ref 30.0–36.0)
MCV: 97.9 fL (ref 80.0–100.0)
Monocytes Absolute: 0.8 10*3/uL (ref 0.1–1.0)
Monocytes Relative: 5 %
Neutro Abs: 14.3 10*3/uL — ABNORMAL HIGH (ref 1.7–7.7)
Neutrophils Relative %: 89 %
Platelets: 179 10*3/uL (ref 150–400)
RBC: 4.71 MIL/uL (ref 3.87–5.11)
RDW: 14.1 % (ref 11.5–15.5)
WBC: 16.2 10*3/uL — ABNORMAL HIGH (ref 4.0–10.5)
nRBC: 0 % (ref 0.0–0.2)

## 2022-02-13 LAB — URINALYSIS, COMPLETE (UACMP) WITH MICROSCOPIC
Bacteria, UA: NONE SEEN
Bilirubin Urine: NEGATIVE
Glucose, UA: NEGATIVE mg/dL
Hgb urine dipstick: NEGATIVE
Ketones, ur: NEGATIVE mg/dL
Leukocytes,Ua: NEGATIVE
Nitrite: NEGATIVE
Protein, ur: NEGATIVE mg/dL
Specific Gravity, Urine: 1.004 — ABNORMAL LOW (ref 1.005–1.030)
pH: 6 (ref 5.0–8.0)

## 2022-02-13 LAB — RESP PANEL BY RT-PCR (FLU A&B, COVID) ARPGX2
Influenza A by PCR: NEGATIVE
Influenza B by PCR: NEGATIVE
SARS Coronavirus 2 by RT PCR: NEGATIVE

## 2022-02-13 LAB — TROPONIN I (HIGH SENSITIVITY)
Troponin I (High Sensitivity): 6 ng/L (ref <18)
Troponin I (High Sensitivity): 7 ng/L (ref <18)

## 2022-02-13 LAB — LACTIC ACID, PLASMA
Lactic Acid, Venous: 2.2 mmol/L (ref 0.5–1.9)
Lactic Acid, Venous: 1.8 mmol/L (ref 0.5–1.9)

## 2022-02-13 LAB — SEDIMENTATION RATE: Sed Rate: 4 mm/hr (ref 0–30)

## 2022-02-13 MED ORDER — IBUPROFEN 800 MG PO TABS
800.0000 mg | ORAL_TABLET | Freq: Once | ORAL | Status: AC
Start: 2022-02-13 — End: 2022-02-13
  Administered 2022-02-13: 800 mg via ORAL
  Filled 2022-02-13: qty 1

## 2022-02-13 MED ORDER — CEFTRIAXONE SODIUM 1 G IJ SOLR: 1.0000 g | Freq: Once | INTRAMUSCULAR | Status: DC

## 2022-02-13 MED ORDER — SODIUM CHLORIDE 0.9 % IV BOLUS
1000.0000 mL | Freq: Once | INTRAVENOUS | Status: AC
Start: 2022-02-13 — End: 2022-02-13
  Administered 2022-02-13: 1000 mL via INTRAVENOUS

## 2022-02-13 MED ORDER — CEPHALEXIN 500 MG PO CAPS: 500.0000 mg | ORAL_CAPSULE | Freq: Four times a day (QID) | ORAL | 0 refills | Status: AC

## 2022-02-13 MED ORDER — SODIUM CHLORIDE 0.9 % IV BOLUS
1000.0000 mL | Freq: Once | INTRAVENOUS | Status: AC
  Administered 2022-02-13: 1000 mL via INTRAVENOUS

## 2022-02-13 MED ORDER — SODIUM CHLORIDE 0.9 % IV SOLN
1.0000 g | Freq: Once | INTRAVENOUS | Status: AC
  Administered 2022-02-13: 1 g via INTRAVENOUS
  Filled 2022-02-13: qty 10

## 2022-02-13 MED ORDER — ACETAMINOPHEN 500 MG PO TABS
1000.0000 mg | ORAL_TABLET | Freq: Once | ORAL | Status: AC
  Administered 2022-02-13: 1000 mg via ORAL
  Filled 2022-02-13: qty 2

## 2022-02-13 NOTE — ED Triage Notes (Signed)
Pt c/o generalized body aches starting this morning and chronic bilateral knee pain.  Pain score 10/10.  Pt reports having a CT w/ contrast x2 days ago and is afraid she has an infection from the contrast.

## 2022-02-13 NOTE — ED Provider Notes (Signed)
[facility] EMERGENCY DEPARTMENT Provider Note   CSN: [id number] Arrival date & time: 02/13/22  1641     History  Chief Complaint  Patient presents with   Generalized Body Aches    Stacey Nichols is a 61 y.o. female presents to the emergency department for evaluation of body aches, weakness, fatigue.  Symptoms have been present for 1 to 2 days.  Noted fever of 100.0.  She describes bilateral knee pain with no trauma or injury.  History of bilateral total knee arthroplasties in 2019.  She states both knees, left greater than right feel swollen and have felt like they are buckling and giving away on her.  She has pain with walking.  She denies any abdominal pain, back pain nausea vomiting.  She has reported low-grade fever but has not had any Tylenol or ibuprofen.  She denies any urinary symptoms, vaginal discharge.  No coughing, sore throat.  No dizziness or lightheadedness.  Patient is ambulatory with no assistive devices.  2 days ago patient did have a CT angio of the chest showing no acute changes or PE.  HPI     Home Medications Prior to Admission medications   Medication Sig Start Date End Date Taking? Authorizing Provider  cephALEXin (KEFLEX) 500 MG capsule Take 1 capsule (500 mg total) by mouth 4 (four) times daily for 5 days. 02/13/22 02/18/22 Yes Evon Slack, PA-C  acetaminophen (TYLENOL) 500 MG tablet Take 500 mg by mouth every 6 (six) hours as needed.    [provider]  aspirin 81 MG tablet Take 81 mg by mouth daily.    [provider]  b complex vitamins capsule Take 1 capsule by mouth every Monday, Wednesday, and Friday. 07/25/21   Eustaquio Boyden, MD  Biotin 2500 MCG CAPS Take by mouth. Takes 2 chews daily    [provider]  Ibuprofen (ADVIL PO) Take by mouth.    [provider]  Magnesium Chloride (MAGNESIUM DR PO) Take by mouth. Takes powder daily    [provider]  metFORMIN (GLUCOPHAGE) 500 MG  tablet Take 1 tablet (500 mg total) by mouth daily with breakfast. 07/25/21   Eustaquio Boyden, MD  MISC NATURAL PRODUCTS PO Take by mouth daily. Keto gummies    [provider]  Multiple Vitamin (MULTIVITAMIN) tablet Take 1 tablet by mouth daily.    [provider]  naproxen (NAPROSYN) 500 MG tablet TAKE 1 TABLET BY MOUTH TWICE A DAY FOR 7 DAYS THEN AS NEEDED FOR PAIN *TAKE WITH FOOD* 02/11/22   Eustaquio Boyden, MD  rosuvastatin (CRESTOR) 10 MG tablet TAKE 1 TABLET BY MOUTH EVERYDAY AT BEDTIME 07/25/21   Eustaquio Boyden, MD  Semaglutide-Weight Management [facility]) 0.25 MG/0.5ML SOAJ Inject 0.25 mg into the skin once a week. Patient not taking: Reported on 10/16/2021 07/25/21   Eustaquio Boyden, MD  traZODone (DESYREL) 50 MG tablet Take 0.5-1 tablets (25-50 mg total) by mouth at bedtime as needed for sleep. 01/30/22   Eustaquio Boyden, MD  Vitamin D, Ergocalciferol, (DRISDOL) 1.25 MG (50000 UNIT) CAPS capsule TAKE 1 CAPSULE BY MOUTH ONE TIME PER WEEK 01/30/22   Eustaquio Boyden, MD      Allergies    Latex, Sulfa antibiotics, and Tape    Review of Systems   Review of Systems  Physical Exam Updated Vital Signs BP 108/62   Pulse 92   Temp 99.1 F (37.3 C) (Oral)   Resp 18   Ht 5\' 2"  (1.575 m)  Wt 111.1 kg   SpO2 93%   BMI 44.81 kg/m  Physical Exam Constitutional:      Appearance: She is well-developed.  HENT:     Head: Normocephalic and atraumatic.     Nose: Nose normal. No congestion.     Mouth/Throat:     Mouth: Mucous membranes are moist.     Pharynx: No oropharyngeal exudate or posterior oropharyngeal erythema.  Eyes:     Conjunctiva/sclera: Conjunctivae normal.  Cardiovascular:     Rate and Rhythm: Regular rhythm. Tachycardia present.  Pulmonary:     Effort: Pulmonary effort is normal. No respiratory distress.     Breath sounds: Normal breath sounds. No wheezing or rales.  Abdominal:     General: Bowel sounds are normal. There is no distension.      Tenderness: There is no abdominal tenderness. There is no guarding.  Musculoskeletal:        General: Normal range of motion.     Cervical back: Normal range of motion. Rigidity present.     Comments: Tenderness to bilateral knees with no noticeable effusion.  Extensor mechanisms intact both hips move well with internal ex rotation with no discomfort.  No swelling or edema in the lower extremities.  No tender in the calf, negative Homans' sign bilaterally.  Both knees are stable to valgus varus stress testing in full extension but do have significant mid flexion instability in the left greater than right knee.  Skin:    General: Skin is warm.     Findings: No rash.  Neurological:     General: No focal deficit present.     Mental Status: She is alert and oriented to person, place, and time. Mental status is at baseline.     Cranial Nerves: No cranial nerve deficit.  Psychiatric:        Mood and Affect: Mood normal.        Behavior: Behavior normal.        Thought Content: Thought content normal.     ED Results / Procedures / Treatments   Labs (all labs ordered are listed, but only abnormal results are displayed) Labs Reviewed  COMPREHENSIVE METABOLIC PANEL - Abnormal; Notable for the following components:      Result Value   Glucose, Bld 123 (*)    BUN 21 (*)    All other components within normal limits  CBC WITH DIFFERENTIAL/PLATELET - Abnormal; Notable for the following components:   WBC 16.2 (*)    Hemoglobin 15.1 (*)    HCT 46.1 (*)    Neutro Abs 14.3 (*)    All other components within normal limits  URINALYSIS, ROUTINE W REFLEX MICROSCOPIC - Abnormal; Notable for the following components:   Color, Urine YELLOW (*)    APPearance HAZY (*)    Leukocytes,Ua SMALL (*)    Bacteria, UA RARE (*)    All other components within normal limits  LACTIC ACID, PLASMA - Abnormal; Notable for the following components:   Lactic Acid, Venous 2.2 (*)    All other components within normal  limits  URINALYSIS, COMPLETE (UACMP) WITH MICROSCOPIC - Abnormal; Notable for the following components:   Color, Urine STRAW (*)    APPearance CLEAR (*)    Specific Gravity, Urine 1.004 (*)    All other components within normal limits  RESP PANEL BY RT-PCR (FLU A&B, COVID) ARPGX2  CULTURE, BLOOD (SINGLE)  URINE CULTURE  URINE CULTURE  SEDIMENTATION RATE  LACTIC ACID, PLASMA  TROPONIN I (HIGH SENSITIVITY)  TROPONIN I (HIGH SENSITIVITY)    EKG None  Radiology DG Chest 2 View  Result Date: 02/13/2022 CLINICAL DATA:  Chest pain for several hours, initial encounter EXAM: CHEST - 2 VIEW COMPARISON:  03/08/20 FINDINGS: Cardiac shadow is within normal limits. The lungs are well aerated bilaterally. No focal infiltrate or sizable effusion is seen. No acute bony abnormality is noted. IMPRESSION: No acute abnormality noted. Electronically Signed   By: Inez Catalina M.D.   On: 02/13/2022 20:23   DG Knee 2 Views Right  Result Date: 02/13/2022 CLINICAL DATA:  Chronic bilateral knee pain. EXAM: RIGHT KNEE - 1-2 VIEW COMPARISON:  12/20/2013 FINDINGS: Postoperative changes with right total knee arthroplasty including patellar femoral component. Components appear well seated. No evidence of acute fracture or dislocation. No focal bone lesion or bone destruction. Bone cortex appears intact. No significant effusions. Soft tissues are unremarkable. IMPRESSION: Right total knee arthroplasty. Components appear well seated. No acute bony abnormalities. Electronically Signed   By: Lucienne Capers M.D.   On: 02/13/2022 20:02   DG Knee 2 Views Left  Result Date: 02/13/2022 CLINICAL DATA:  Chronic bilateral knee pain. EXAM: LEFT KNEE - 1-2 VIEW COMPARISON:  12/20/2013 FINDINGS: Left total hip arthroplasty including patellar femoral component. Components appear well seated. No evidence of acute fracture or dislocation. No focal bone lesion or bone destruction. Bone cortex appears intact. No significant effusion. Soft  tissues are unremarkable. IMPRESSION: Left total knee arthroplasty. Components appear well seated. No acute displaced fractures identified. Electronically Signed   By: Lucienne Capers M.D.   On: 02/13/2022 20:01    Procedures Procedures   Medications Ordered in ED Medications  cefTRIAXone (ROCEPHIN) 1 g in sodium chloride 0.9 % 100 mL IVPB (1 g Intravenous New Bag/Given 02/13/22 2336)  acetaminophen (TYLENOL) tablet 1,000 mg (1,000 mg Oral Given 02/13/22 1941)  sodium chloride 0.9 % bolus 1,000 mL (0 mLs Intravenous Stopped 02/13/22 2122)  sodium chloride 0.9 % bolus 1,000 mL (0 mLs Intravenous Stopped 02/13/22 2339)  ibuprofen (ADVIL) tablet 800 mg (800 mg Oral Given 02/13/22 2141)    ED Course/ Medical Decision Making/ A&P                           Medical Decision Making Amount and/or Complexity of Data Reviewed Labs: ordered. Radiology: ordered.  Risk OTC drugs.   61 year old female with bilateral knee pain, body aches, chills, low-grade fever.  Patient with history of bilateral total knee arthroplasties performed in 2019, has been doing well up until recently has been having some swelling and instability.  Patient complained of chest pain to PCP and had a normal EKG with CT angiogram of the chest performed 2 days ago showing no acute changes or PE.  Over the last 1 to 2 days patient has developed flulike symptoms with diffuse body pain, aching and a fever of 100.0.  No current chest pain shortness of breath abdominal pain nausea vomiting or diarrhea.  No rashes.  Patient states when she walks her left greater than right knee will give way.  On exam no sign of knee effusion or compartment syndrome.  No signs of DVT bilaterally as there is no swelling warmth redness or tenderness.  A CBC and CMP performed showing elevated white count of 16,000 with slightly elevated neutrophils.  Patient had normal CMP.  Blood cultures obtained.  Flu COVID swab negative.  Her troponin and EKG along with chest  x-ray were all normal.  ESR was 3 indicating no sign of infection in her knees.  Bilateral knee x-ray showed no acute changes, effusion with normal intact knee prosthesis.  On exam her knees do have some mid flexion instability.  Patient's lactic acid was initially elevated at 2.2 but with 2 L of fluids lactic acid down to 1.8.  Patient's initial urinalysis showed elevated WBCs, leukocytes.  Urine culture obtained.  Patient was given Tylenol and ibuprofen and vital signs significantly improved.  No longer running a fever, blood pressure stable and she is nontachycardic.  Patient states her body aches and pains are much improved she feels much better overall.  No longer having any chills or body aches.  Her mobility has improved and knees are feeling better as well.  Patient appears medically stable and ready for discharge to home.  She was given 1 g of ceftriaxone and sent home with cephalexin x5 days.  She understands signs and symptoms return to the ER for.  Recommend she follow-up with PCP as needed and recommend she discuss knee pain, instability and swelling with orthopedist.   Final Clinical Impression(s) / ED Diagnoses Final diagnoses:  Acute pain of both knees  Knee instability, left  Flu-like symptoms  Body aches  Fever, unspecified fever cause  Acute cystitis without hematuria    Rx / DC Orders ED Discharge Orders          Ordered    cephALEXin (KEFLEX) 500 MG capsule  4 times daily        02/13/22 2322              Renata Caprice 02/13/22 2359    Carrie Mew, MD 02/14/22 1840

## 2022-02-13 NOTE — ED Provider Triage Note (Signed)
Emergency Medicine Provider Triage Evaluation Note  Stacey Nichols, a 61 y.o. female  was evaluated in triage.  Pt complains of generalized weakness and fatigue.  Patient is 2 days status post CT chest angio for elevated D-dimer.  She reports onset of symptoms today, noting concern for possible infection from the contrast dye.  She notes generalized myalgias and joint pain as well as acute on chronic bilateral knee pain.  Review of Systems  Positive: Myalgias, generalized weakness Negative: CP, SOB  Physical Exam  BP (!) 126/92 (BP Location: Left Arm)   Pulse (!) 112   Temp 100 F (37.8 C) (Oral)   Resp 18   Ht '5\' 2"'$  (1.575 m)   Wt 111.1 kg   SpO2 92%   BMI 44.81 kg/m  Gen:   Awake, no distress  NAD Resp:  Normal effort  MSK:   Moves extremities without difficulty  Other:    Medical Decision Making  Medically screening exam initiated at 5:39 PM.  Appropriate orders placed.  Stacey Nichols was informed that the remainder of the evaluation will be completed by another provider, this initial triage assessment does not replace that evaluation, and the importance of remaining in the ED until their evaluation is complete.  Patient to the ED for evaluation of generalized myalgias and weakness 2 days status post CT chest angio which is negative.   Melvenia Needles, PA-C 02/13/22 1742

## 2022-02-13 NOTE — ED Triage Notes (Signed)
First Nurse Note:  Pt via EMS from home. Pt c/o bilateral knee pain. Pt has a hx knee pain replacement in 2019. Pt is A&Ox4 and NAD  112 HR 96%  111/74 BP

## 2022-02-13 NOTE — ED Notes (Signed)
Purewick applied. Pt. States she cannot walk because her knees hurt too bad.

## 2022-02-13 NOTE — Discharge Instructions (Signed)
Please continue with Tylenol and meloxicam as needed for pain.  Call orthopedist to schedule follow-up appointment to discuss bilateral knee pain, instability.  Take antibiotic as prescribed for 5 days, return to the ER if you continue to have any fevers, back pain, nausea, vomiting or any urgent changes in your health

## 2022-02-15 LAB — URINE CULTURE
Culture: NO GROWTH
Culture: NO GROWTH

## 2022-02-15 NOTE — Telephone Encounter (Signed)
Spoke with patient - bad knee pain - seen at ER.  Workup unrevealing.  Marked arthralgias.  Referred back to orthopedics.  Denies tick bites, but has had mosquito bites.   No nausea,vomiting, skin rash, itching.  +mild fever, MSK pain, headache.   ?delayed IV contrast reaction after recent CTA chest. Discussed with patient, supportive measures reviewed - continue meloxicam, add tylenol scheduled '1000mg'$  TID. Update with effect.

## 2022-02-18 LAB — CULTURE, BLOOD (SINGLE)
Culture: NO GROWTH
Special Requests: ADEQUATE

## 2022-03-06 ENCOUNTER — Ambulatory Visit
Admission: RE | Admit: 2022-03-06 | Discharge: 2022-03-06 | Disposition: A | Discharge status: Home / Self Care | Payer: 59 | Source: Ambulatory Visit | Attending: Obstetrics & Gynecology | Admitting: Obstetrics & Gynecology

## 2022-03-06 ENCOUNTER — Ambulatory Visit
Admission: RE | Admit: 2022-03-06 | Discharge: 2022-03-06 | Disposition: A | Discharge status: Home / Self Care | Payer: 59 | Source: Ambulatory Visit | Attending: Family Medicine | Admitting: Family Medicine

## 2022-03-06 DIAGNOSIS — Z1231 Encounter for screening mammogram for malignant neoplasm of breast: Secondary | ICD-10-CM

## 2022-03-06 DIAGNOSIS — M8580 Other specified disorders of bone density and structure, unspecified site: Secondary | ICD-10-CM | POA: Insufficient documentation

## 2022-03-09 ENCOUNTER — Encounter: Payer: Self-pay | Admitting: Family Medicine

## 2022-05-10 ENCOUNTER — Ambulatory Visit: Payer: 59 | Admitting: Family Medicine

## 2022-05-10 ENCOUNTER — Encounter: Payer: Self-pay | Admitting: Family Medicine

## 2022-05-10 VITALS — BP 124/76 | HR 77 | Temp 97.5°F | Ht 62.0 in | Wt 251.1 lb

## 2022-05-10 DIAGNOSIS — G4700 Insomnia, unspecified: Secondary | ICD-10-CM | POA: Diagnosis not present

## 2022-05-10 DIAGNOSIS — E039 Hypothyroidism, unspecified: Secondary | ICD-10-CM | POA: Diagnosis not present

## 2022-05-10 DIAGNOSIS — M85852 Other specified disorders of bone density and structure, left thigh: Secondary | ICD-10-CM

## 2022-05-10 DIAGNOSIS — R7303 Prediabetes: Secondary | ICD-10-CM

## 2022-05-10 DIAGNOSIS — Z23 Encounter for immunization: Secondary | ICD-10-CM

## 2022-05-10 DIAGNOSIS — M170 Bilateral primary osteoarthritis of knee: Secondary | ICD-10-CM

## 2022-05-10 LAB — POCT GLYCOSYLATED HEMOGLOBIN (HGB A1C): Hemoglobin A1C: 6.2 % — AB (ref 4.0–5.6)

## 2022-05-10 NOTE — Assessment & Plan Note (Addendum)
Continue to encourage healthy diet and lifestyle choices to affect sustainable weight loss. Discussed activity routine.  Pending A1c results, consider GLP1RA vs Mounjaro.

## 2022-05-10 NOTE — Assessment & Plan Note (Addendum)
Saw ortho 02/2022, thought arthritis flare, continues naprosyn '500mg'$  nightly

## 2022-05-10 NOTE — Assessment & Plan Note (Signed)
Latest DEXA with stable osteopenia without increased fracture risk.

## 2022-05-10 NOTE — Assessment & Plan Note (Signed)
Update A1c - see below.

## 2022-05-10 NOTE — Assessment & Plan Note (Signed)
Notes dry skin - discussed limiting hot showers, regular moisturizer use. rec hold biotin for 2 days prior to next labwork.

## 2022-05-10 NOTE — Patient Instructions (Addendum)
Flu shot today Ok to stay off metformin at this time.  Good to see you today. Return in 3 months for physical, prior fasting for blood work.

## 2022-05-10 NOTE — Assessment & Plan Note (Signed)
Notes improvement on trazodone '25mg'$  nightly - continue this.

## 2022-05-10 NOTE — Progress Notes (Signed)
Patient ID: Stacey Nichols, female    DOB: [birth year], 61 y.o.   MRN: [id number]  This visit was conducted in person.  BP 124/76   Pulse 77   Temp (!) 97.5 F (36.4 C) (Temporal)   Ht '5\' 2"'$  (1.575 m)   Wt 251 lb 2 oz (113.9 kg)   SpO2 95%   BMI 45.93 kg/m    CC: 3 mo f/u visit  Subjective:   HPI: Stacey Nichols is a 61 y.o. female presenting on 05/10/2022 for Follow-up (Here for 3 mo f/u.)   Possible delayed reaction to IV contrast after CTA chest presenting with fever, MSK pain, HA, marked arthralgias. She was seen at ER for this, then ortho.   DEXA 02/2022 - T -1.6 L femur neck, not at increased risk of fracture   Prediabetes - has been off metformin for 3 months. Due for rpt A1c to see effect off metformin.   She has been feeling well, has been sleeping well on trazodone '25mg'$  nightly.   Planning trip December to family in [place].      Relevant past medical, surgical, family and social history reviewed and updated as indicated. Interim medical history since our last visit reviewed. Allergies and medications reviewed and updated. Outpatient Medications Prior to Visit  Medication Sig Dispense Refill   acetaminophen (TYLENOL) 500 MG tablet Take 500 mg by mouth every 6 (six) hours as needed.     aspirin 81 MG tablet Take 81 mg by mouth daily.     b complex vitamins capsule Take 1 capsule by mouth every Monday, Wednesday, and Friday.     Biotin 2500 MCG CAPS Take by mouth. Takes 2 chews daily     Ibuprofen (ADVIL PO) Take by mouth.     Magnesium Chloride (MAGNESIUM DR PO) Take by mouth. Takes powder daily     metFORMIN (GLUCOPHAGE) 500 MG tablet Take 1 tablet (500 mg total) by mouth daily with breakfast. 90 tablet 3   MISC NATURAL PRODUCTS PO Take by mouth daily. Keto gummies     Multiple Vitamin (MULTIVITAMIN) tablet Take 1 tablet by mouth daily.     naproxen (NAPROSYN) 500 MG tablet TAKE 1 TABLET BY MOUTH TWICE A DAY FOR 7 DAYS THEN AS NEEDED FOR PAIN *TAKE WITH FOOD* 60  tablet 3   rosuvastatin (CRESTOR) 10 MG tablet TAKE 1 TABLET BY MOUTH EVERYDAY AT BEDTIME 90 tablet 3   traZODone (DESYREL) 50 MG tablet Take 0.5-1 tablets (25-50 mg total) by mouth at bedtime as needed for sleep. 30 tablet 3   Vitamin D, Ergocalciferol, (DRISDOL) 1.25 MG (50000 UNIT) CAPS capsule TAKE 1 CAPSULE BY MOUTH ONE TIME PER WEEK 12 capsule 3   Semaglutide-Weight Management (WEGOVY) 0.25 MG/0.5ML SOAJ Inject 0.25 mg into the skin once a week. 2 mL 0   No facility-administered medications prior to visit.     Per HPI unless specifically indicated in ROS section below Review of Systems  Objective:  BP 124/76   Pulse 77   Temp (!) 97.5 F (36.4 C) (Temporal)   Ht '5\' 2"'$  (1.575 m)   Wt 251 lb 2 oz (113.9 kg)   SpO2 95%   BMI 45.93 kg/m   Wt Readings from Last 3 Encounters:  05/10/22 251 lb 2 oz (113.9 kg)  02/13/22 245 lb (111.1 kg)  01/29/22 245 lb 8 oz (111.4 kg)      Physical Exam Vitals and nursing note reviewed.  Constitutional:  Appearance: Normal appearance. She is not ill-appearing.  HENT:     Head: Normocephalic and atraumatic.     Mouth/Throat:     Mouth: Mucous membranes are moist.     Pharynx: Oropharynx is clear. No oropharyngeal exudate or posterior oropharyngeal erythema.  Eyes:     Extraocular Movements: Extraocular movements intact.     Conjunctiva/sclera: Conjunctivae normal.     Pupils: Pupils are equal, round, and reactive to light.  Cardiovascular:     Rate and Rhythm: Normal rate and regular rhythm.     Pulses: Normal pulses.     Heart sounds: Normal heart sounds. No murmur heard. Pulmonary:     Effort: Pulmonary effort is normal. No respiratory distress.     Breath sounds: Normal breath sounds. No wheezing, rhonchi or rales.  Musculoskeletal:     Right lower leg: No edema.     Left lower leg: No edema.  Skin:    General: Skin is warm and dry.     Findings: No rash.  Neurological:     Mental Status: She is alert.  Psychiatric:         Mood and Affect: Mood normal.        Behavior: Behavior normal.       Lab Results  Component Value Date   HGBA1C 6.2 (A) 05/10/2022     Assessment & Plan:   Problem List Items Addressed This Visit     Obesity, morbid, BMI 40.0-49.9 ([place])    Continue to encourage healthy diet and lifestyle choices to affect sustainable weight loss. Discussed activity routine.  Pending A1c results, consider GLP1RA vs Mounjaro.       Primary osteoarthritis of both knees    Saw ortho 02/2022, thought arthritis flare, continues naprosyn '500mg'$  nightly      Prediabetes - Primary    Update A1c - see below.      Relevant Orders   POCT glycosylated hemoglobin (Hb A1C) (Completed)   Osteopenia    Latest DEXA with stable osteopenia without increased fracture risk.       Hypothyroidism (acquired)    Notes dry skin - discussed limiting hot showers, regular moisturizer use. rec hold biotin for 2 days prior to next labwork.       Insomnia    Notes improvement on trazodone '25mg'$  nightly - continue this.       Other Visit Diagnoses     Need for influenza vaccination       Relevant Orders   Flu Vaccine QUAD 54moIM (Fluarix, Fluzone & Alfiuria Quad PF) (Completed)        No orders of the defined types were placed in this encounter.  Orders Placed This Encounter  Procedures   Flu Vaccine QUAD 660moM (Fluarix, Fluzone & Alfiuria Quad PF)   POCT glycosylated hemoglobin (Hb A1C)     Patient Instructions  Flu shot today Ok to stay off metformin at this time.  Good to see you today. Return in 3 months for physical, prior fasting for blood work.   Follow up plan: Return in about 3 months (around 08/10/2022) for annual exam, prior fasting for blood work.  JaRia BushMD

## 2022-05-15 DIAGNOSIS — E119 Type 2 diabetes mellitus without complications: Secondary | ICD-10-CM | POA: Diagnosis not present

## 2022-05-15 LAB — HM DIABETES EYE EXAM

## 2022-05-23 ENCOUNTER — Encounter: Payer: Self-pay | Admitting: Family Medicine

## 2022-05-31 ENCOUNTER — Encounter: Payer: Self-pay | Admitting: Family Medicine

## 2022-05-31 NOTE — Telephone Encounter (Signed)
Mailed rx to pt

## 2022-05-31 NOTE — Telephone Encounter (Signed)
Plz mail written Rx to patient. - placed in Lisa's box.

## 2022-07-03 ENCOUNTER — Encounter: Payer: Self-pay | Admitting: Family Medicine

## 2022-07-04 ENCOUNTER — Other Ambulatory Visit: Payer: Self-pay | Admitting: Family Medicine

## 2022-07-04 MED ORDER — ZEPBOUND 5 MG/0.5ML ~~LOC~~ SOAJ: 5.0000 mg | SUBCUTANEOUS | 0 refills | Status: DC

## 2022-07-05 ENCOUNTER — Other Ambulatory Visit (HOSPITAL_COMMUNITY): Payer: Self-pay

## 2022-07-05 ENCOUNTER — Other Ambulatory Visit: Payer: Self-pay | Admitting: Family Medicine

## 2022-07-08 ENCOUNTER — Telehealth: Payer: Self-pay

## 2022-07-08 NOTE — Telephone Encounter (Signed)
ERROR

## 2022-07-08 NOTE — Telephone Encounter (Signed)
Patient Advocate Encounter   Received notification from [place] that prior authorization for Zepbound 2.'5mg'$ /0.13m is required.   PA submitted on 07/08/2022 Key [id number] is pending       AJoneen Boers CHelenaPatient Advocate Specialist CMonahansPatient Advocate Team Direct Number: [phone]: [phone]

## 2022-07-10 NOTE — Telephone Encounter (Signed)
Pharmacy Patient Advocate Encounter  Received notification from [place] that the request for prior authorization for Zepbound 2.'5mg'$  has been denied due to .    How would you like to proceed?  Please be advised appeals may take up to 5 business days to be submitted as pharmacist prepares necessary documentation.  Thank you!

## 2022-07-11 NOTE — Telephone Encounter (Signed)
Lvm asking pt to call back.  Need to relay Dr. G's message.  

## 2022-07-11 NOTE — Telephone Encounter (Signed)
Please notify pt we received denial for Zepbound from her insurance. Insurance is stating they don't cover this medication at all regardless of reason.  In that case we may need to just continue working on healthy diet and lifestyle choices and continue monitoring A1c in prediabetes history.

## 2022-07-12 NOTE — Telephone Encounter (Addendum)
Lvm asking pt to call back.  Need to relay Dr. G's message.   Mailing a letter.  

## 2022-07-27 ENCOUNTER — Other Ambulatory Visit: Payer: Self-pay | Admitting: Family Medicine

## 2022-07-27 DIAGNOSIS — M85852 Other specified disorders of bone density and structure, left thigh: Secondary | ICD-10-CM

## 2022-07-27 DIAGNOSIS — E039 Hypothyroidism, unspecified: Secondary | ICD-10-CM

## 2022-07-27 DIAGNOSIS — C9201 Acute myeloblastic leukemia, in remission: Secondary | ICD-10-CM

## 2022-07-27 DIAGNOSIS — Z9884 Bariatric surgery status: Secondary | ICD-10-CM

## 2022-07-27 DIAGNOSIS — E785 Hyperlipidemia, unspecified: Secondary | ICD-10-CM

## 2022-07-27 DIAGNOSIS — R7303 Prediabetes: Secondary | ICD-10-CM

## 2022-07-27 DIAGNOSIS — E559 Vitamin D deficiency, unspecified: Secondary | ICD-10-CM

## 2022-07-27 DIAGNOSIS — E538 Deficiency of other specified B group vitamins: Secondary | ICD-10-CM

## 2022-07-27 NOTE — Addendum Note (Signed)
Addended by: Ria Bush on: 07/27/2022 12:01 PM   Modules accepted: Orders

## 2022-07-29 ENCOUNTER — Other Ambulatory Visit: Payer: Self-pay | Admitting: Family Medicine

## 2022-07-29 ENCOUNTER — Other Ambulatory Visit (INDEPENDENT_AMBULATORY_CARE_PROVIDER_SITE_OTHER): Payer: 59

## 2022-07-29 DIAGNOSIS — C9201 Acute myeloblastic leukemia, in remission: Secondary | ICD-10-CM

## 2022-07-29 DIAGNOSIS — E559 Vitamin D deficiency, unspecified: Secondary | ICD-10-CM | POA: Diagnosis not present

## 2022-07-29 DIAGNOSIS — E039 Hypothyroidism, unspecified: Secondary | ICD-10-CM | POA: Diagnosis not present

## 2022-07-29 DIAGNOSIS — E785 Hyperlipidemia, unspecified: Secondary | ICD-10-CM

## 2022-07-29 DIAGNOSIS — Z9884 Bariatric surgery status: Secondary | ICD-10-CM

## 2022-07-29 DIAGNOSIS — R7303 Prediabetes: Secondary | ICD-10-CM

## 2022-07-29 DIAGNOSIS — E538 Deficiency of other specified B group vitamins: Secondary | ICD-10-CM | POA: Diagnosis not present

## 2022-07-29 DIAGNOSIS — M85852 Other specified disorders of bone density and structure, left thigh: Secondary | ICD-10-CM

## 2022-07-29 LAB — T4, FREE: Free T4: 0.89 ng/dL (ref 0.60–1.60)

## 2022-07-29 LAB — COMPREHENSIVE METABOLIC PANEL
ALT: 25 U/L (ref 0–35)
AST: 23 U/L (ref 0–37)
Albumin: 4.4 g/dL (ref 3.5–5.2)
Alkaline Phosphatase: 73 U/L (ref 39–117)
BUN: 19 mg/dL (ref 6–23)
CO2: 29 mEq/L (ref 19–32)
Calcium: 9.6 mg/dL (ref 8.4–10.5)
Chloride: 103 mEq/L (ref 96–112)
Creatinine, Ser: 0.85 mg/dL (ref 0.40–1.20)
GFR: 74.4 mL/min (ref >60.00)
Glucose, Bld: 101 mg/dL — ABNORMAL HIGH (ref 70–99)
Potassium: 4.4 mEq/L (ref 3.5–5.1)
Sodium: 142 mEq/L (ref 135–145)
Total Bilirubin: 0.3 mg/dL (ref 0.2–1.2)
Total Protein: 6.9 g/dL (ref 6.0–8.3)

## 2022-07-29 LAB — TSH: TSH: 3.25 u[IU]/mL (ref 0.35–5.50)

## 2022-07-29 LAB — IBC PANEL
Iron: 67 ug/dL (ref 42–145)
Saturation Ratios: 17.3 % — ABNORMAL LOW (ref 20.0–50.0)
TIBC: 387.8 ug/dL (ref 250.0–450.0)
Transferrin: 277 mg/dL (ref 212.0–360.0)

## 2022-07-29 LAB — LIPID PANEL
Cholesterol: 119 mg/dL (ref 0–200)
HDL: 45.4 mg/dL (ref >39.00)
LDL Cholesterol: 48 mg/dL (ref 0–99)
NonHDL: 74.02
Total CHOL/HDL Ratio: 3
Triglycerides: 132 mg/dL (ref 0.0–149.0)
VLDL: 26.4 mg/dL (ref 0.0–40.0)

## 2022-07-29 LAB — FERRITIN: Ferritin: 60.3 ng/mL (ref 10.0–291.0)

## 2022-07-29 LAB — FOLATE: Folate: 19.2 ng/mL (ref >5.9)

## 2022-07-29 LAB — HEMOGLOBIN A1C: Hgb A1c MFr Bld: 6.3 % (ref 4.6–6.5)

## 2022-07-29 LAB — VITAMIN B12: Vitamin B-12: 964 pg/mL — ABNORMAL HIGH (ref 211–911)

## 2022-07-29 LAB — VITAMIN D 25 HYDROXY (VIT D DEFICIENCY, FRACTURES): VITD: 22.61 ng/mL — ABNORMAL LOW (ref 30.00–100.00)

## 2022-07-29 NOTE — Addendum Note (Signed)
Addended by: Ellamae Sia on: 07/29/2022 07:31 AM   Modules accepted: Orders

## 2022-07-30 LAB — CBC WITH DIFFERENTIAL/PLATELET
Absolute Monocytes: 943 cells/uL (ref 200–950)
Basophils Absolute: 33 cells/uL (ref 0–200)
Basophils Relative: 0.5 %
Eosinophils Absolute: 390 cells/uL (ref 15–500)
Eosinophils Relative: 6 %
HCT: 46.1 % — ABNORMAL HIGH (ref 35.0–45.0)
Hemoglobin: 15.7 g/dL — ABNORMAL HIGH (ref 11.7–15.5)
Lymphs Abs: 2743 cells/uL (ref 850–3900)
MCH: 32.9 pg (ref 27.0–33.0)
MCHC: 34.1 g/dL (ref 32.0–36.0)
MCV: 96.6 fL (ref 80.0–100.0)
MPV: 11.3 fL (ref 7.5–12.5)
Monocytes Relative: 14.5 %
Neutro Abs: 2392 cells/uL (ref 1500–7800)
Neutrophils Relative %: 36.8 %
Platelets: 194 10*3/uL (ref 140–400)
RBC: 4.77 10*6/uL (ref 3.80–5.10)
RDW: 13.2 % (ref 11.0–15.0)
Total Lymphocyte: 42.2 %
WBC: 6.5 10*3/uL (ref 3.8–10.8)

## 2022-07-30 LAB — PATHOLOGIST SMEAR REVIEW

## 2022-07-30 NOTE — Telephone Encounter (Signed)
Message from pharmacy: [place].    Trazodone  Last OV: 05/10/22, 3 mo f/u Next OV: 08/05/22, CPE

## 2022-08-03 LAB — VITAMIN B1: Vitamin B1 (Thiamine): 11 nmol/L (ref 8–30)

## 2022-08-05 ENCOUNTER — Encounter: Payer: Self-pay | Admitting: Family Medicine

## 2022-08-05 ENCOUNTER — Other Ambulatory Visit: Payer: 59

## 2022-08-05 ENCOUNTER — Ambulatory Visit (INDEPENDENT_AMBULATORY_CARE_PROVIDER_SITE_OTHER): Payer: 59 | Admitting: Family Medicine

## 2022-08-05 VITALS — BP 122/80 | HR 81 | Temp 98.1°F | Ht 62.5 in | Wt 243.2 lb

## 2022-08-05 DIAGNOSIS — R7303 Prediabetes: Secondary | ICD-10-CM | POA: Diagnosis not present

## 2022-08-05 DIAGNOSIS — E785 Hyperlipidemia, unspecified: Secondary | ICD-10-CM | POA: Diagnosis not present

## 2022-08-05 DIAGNOSIS — E538 Deficiency of other specified B group vitamins: Secondary | ICD-10-CM

## 2022-08-05 DIAGNOSIS — E559 Vitamin D deficiency, unspecified: Secondary | ICD-10-CM

## 2022-08-05 DIAGNOSIS — Z9884 Bariatric surgery status: Secondary | ICD-10-CM | POA: Diagnosis not present

## 2022-08-05 DIAGNOSIS — Z0000 Encounter for general adult medical examination without abnormal findings: Secondary | ICD-10-CM

## 2022-08-05 DIAGNOSIS — C9201 Acute myeloblastic leukemia, in remission: Secondary | ICD-10-CM

## 2022-08-05 DIAGNOSIS — J302 Other seasonal allergic rhinitis: Secondary | ICD-10-CM

## 2022-08-05 DIAGNOSIS — G4700 Insomnia, unspecified: Secondary | ICD-10-CM

## 2022-08-05 DIAGNOSIS — E039 Hypothyroidism, unspecified: Secondary | ICD-10-CM

## 2022-08-05 DIAGNOSIS — M85852 Other specified disorders of bone density and structure, left thigh: Secondary | ICD-10-CM | POA: Diagnosis not present

## 2022-08-05 DIAGNOSIS — R69 Illness, unspecified: Secondary | ICD-10-CM | POA: Diagnosis not present

## 2022-08-05 DIAGNOSIS — F32A Depression, unspecified: Secondary | ICD-10-CM

## 2022-08-05 DIAGNOSIS — G4733 Obstructive sleep apnea (adult) (pediatric): Secondary | ICD-10-CM

## 2022-08-05 MED ORDER — CHOLECALCIFEROL 1.25 MG (50000 UT) PO TABS
1.0000 | ORAL_TABLET | ORAL | 3 refills | Status: DC
Start: 2022-08-05 — End: 2023-03-12

## 2022-08-05 MED ORDER — ZEPBOUND 7.5 MG/0.5ML ~~LOC~~ SOAJ
7.5000 mg | SUBCUTANEOUS | 3 refills | Status: DC
Start: 2022-08-05 — End: 2022-12-10

## 2022-08-05 MED ORDER — ROSUVASTATIN CALCIUM 10 MG PO TABS: 10.0000 mg | ORAL_TABLET | Freq: Every day | ORAL | 4 refills | Status: DC

## 2022-08-05 MED ORDER — ZEPBOUND 7.5 MG/0.5ML ~~LOC~~ SOAJ: 7.5000 mg | SUBCUTANEOUS | 0 refills | Status: DC

## 2022-08-05 NOTE — Assessment & Plan Note (Signed)
Latest DEXA reviewed - stable period.

## 2022-08-05 NOTE — Assessment & Plan Note (Signed)
Refill trazodone Rx PRN sleep.

## 2022-08-05 NOTE — Assessment & Plan Note (Addendum)
CBC , periph smear stable. 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

## 2022-08-05 NOTE — Assessment & Plan Note (Signed)
Restart weekly vit D replacement .

## 2022-08-05 NOTE — Patient Instructions (Addendum)
Suba dosis de Zepbound a 7.'5mg'$  semanalmente Comienze vitamina D3 semanal Irrigar con salina nasal regularmente como necesite.  Gusto verla hoy Regresar en 3 meses para proxima visita

## 2022-08-05 NOTE — Assessment & Plan Note (Addendum)
Mild off medication.

## 2022-08-05 NOTE — Assessment & Plan Note (Signed)
Discussed nasal saline use.

## 2022-08-05 NOTE — Progress Notes (Signed)
Patient ID: Stacey Nichols, female    DOB: 04/27/1961, 62 y.o.   MRN: [id number]  This visit was conducted in person.  BP 122/80   Pulse 81   Temp 98.1 F (36.7 C) (Temporal)   Ht 5' 2.5" (1.588 m)   Wt 243 lb 4 oz (110.3 kg)   SpO2 96%   BMI 43.78 kg/m    CC: CPE Subjective:   HPI: Stacey Nichols is a 62 y.o. female presenting on 08/05/2022 for Annual Exam   Recent trip to [place] - saw podiatrist for bilateral 5th toe ingrown nails removed, currently taking fluconazole.   Notes nasal congestion in am and evenings for 2 weeks. Dry nasal mucosa. No fever, cough, ST, PNdrainage, HA or sinus pressure.  H/o delayed reaction to IV contrast after CTA chest.  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 Latest fall she fell onto left shoulder and knees. This was 3 wks ago.   Bilateral knee pain - s/p L knee surgery 10/2017. And R knee surgery 06/2018. Sees [place] ortho Dr Vickii Chafe.    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   Zepbound started 05/2022. 9 lb weight loss since starting. Currently on '5mg'$  dose, tolerating well.   Preventative: Colon cancer screening - colonoscopy ~2015, rec rpt 5 yrs (in [place]) COLONOSCOPY WITH PROPOFOL 09/19/2020 - TA, int hem, rpt 7 yrs Allen Norris, Darren, MD)  Well woman exam - s/p hysterectomy 2001 precancerous cells, 1 ovary remains. Last well woman 01/2021 [facility]).  Mammogram 02/2022 Birads1 @ [place]  DEXA 12/2015 - osteopenia (T -1.8 Lspine) DEXA 02/2022 - T -1.6 L femur neck, not at increased risk of fracture  Lung cancer screening - not eligible Flu shot yearly [place] 09/2019, 10/2019, booster 06/2020, 09/2021.  Tdap 07/2021 Shingrix - 07/2020, 07/2021  Advanced directive: discussed. Daughter Addysyn Fern is [place], she lives in [place].  Seat belt use discussed   Sunscreen use discussed. No changing moles on skin.  Ex smoker ~2010, previously 1/4 ppd for ~20 yrs Alcohol - seldom  Dentist Q61mo Eye exam - yearly   [place]  Separ[place] from husband after leukemia  Occ: retired, was bArmed forces operational officer Edu: 2 yrs college Activity: no regular exercise  Diet: good ater, fruits/vegetables daily      Relevant past medical, surgical, family and social history reviewed and updated as indicated. Interim medical history since our last visit reviewed. Allergies and medications reviewed and updated. Outpatient Medications Prior to Visit  Medication Sig Dispense Refill   acetaminophen (TYLENOL) 500 MG tablet Take 500 mg by mouth every 6 (six) hours as needed.     aspirin 81 MG tablet Take 81 mg by mouth daily.     b complex vitamins capsule Take 1 capsule by mouth every Monday, Wednesday, and Friday.     fluconazole (DIFLUCAN) 150 MG tablet Take 150 mg by mouth. Takes 1 tablet 2 times a week for 16 weeks     Ibuprofen (ADVIL PO) Take by mouth.     MISC NATURAL PRODUCTS PO Take by mouth daily. Keto gummies     Multiple Vitamin (MULTIVITAMIN) tablet Take 1 tablet by mouth daily.     naproxen (NAPROSYN) 500 MG tablet TAKE 1 TABLET BY MOUTH TWICE A DAY FOR 7 DAYS  THEN AS NEEDED FOR PAIN *TAKE WITH FOOD* 60 tablet 3   traZODone (DESYREL) 50 MG tablet TAKE 1/2 TO 1 TABLET BY MOUTH AT BEDTIME AS NEEDED FOR SLEEP 90 tablet 2   rosuvastatin (CRESTOR) 10 MG tablet TAKE 1 TABLET BY MOUTH EVERYDAY AT BEDTIME 90 tablet 0   tirzepatide (ZEPBOUND) 5 MG/0.5ML Pen Inject 5 mg into the skin once a week. 2 mL 0   Biotin 2500 MCG CAPS Take by mouth. Takes 2 chews daily     Magnesium Chloride (MAGNESIUM DR PO) Take by mouth. Takes powder daily     metFORMIN (GLUCOPHAGE) 500 MG tablet Take 1 tablet (500 mg total) by mouth daily with breakfast. 90 tablet 3   Vitamin D, Ergocalciferol, (DRISDOL) 1.25 MG (50000 UNIT) CAPS capsule TAKE 1 CAPSULE BY MOUTH ONE TIME PER  WEEK 12 capsule 3   No facility-administered medications prior to visit.     Per HPI unless specifically indicated in ROS section below Review of Systems  Constitutional:  Negative for activity change, appetite change, chills, fatigue, fever and unexpected weight change.  HENT:  Negative for hearing loss.   Eyes:  Negative for visual disturbance.  Respiratory:  Negative for cough, chest tightness, shortness of breath and wheezing.   Cardiovascular:  Negative for chest pain, palpitations and leg swelling.  Gastrointestinal:  Negative for abdominal distention, abdominal pain, blood in stool, constipation, diarrhea, nausea and vomiting.  Genitourinary:  Negative for difficulty urinating and hematuria.  Musculoskeletal:  Negative for arthralgias, myalgias and neck pain.  Skin:  Negative for rash.  Neurological:  Negative for dizziness, seizures, syncope and headaches.  Hematological:  Negative for adenopathy. Does not bruise/bleed easily.  Psychiatric/Behavioral:  Negative for dysphoric mood. The patient is not nervous/anxious.     Objective:  BP 122/80   Pulse 81   Temp 98.1 F (36.7 C) (Temporal)   Ht 5' 2.5" (1.588 m)   Wt 243 lb 4 oz (110.3 kg)   SpO2 96%   BMI 43.78 kg/m   Wt Readings from Last 3 Encounters:  08/05/22 243 lb 4 oz (110.3 kg)  05/10/22 251 lb 2 oz (113.9 kg)  02/13/22 245 lb (111.1 kg)      Physical Exam Vitals and nursing note reviewed.  Constitutional:      Appearance: Normal appearance. She is not ill-appearing.  HENT:     Head: Normocephalic and atraumatic.     Right Ear: Tympanic membrane, ear canal and external ear normal. There is no impacted cerumen.     Left Ear: Tympanic membrane, ear canal and external ear normal. There is no impacted cerumen.     Mouth/Throat:     Comments: Wearing mask Eyes:     General:        Right eye: No discharge.        Left eye: No discharge.     Extraocular Movements: Extraocular movements intact.      Conjunctiva/sclera: Conjunctivae normal.     Pupils: Pupils are equal, round, and reactive to light.  Neck:     Thyroid: No thyroid mass or thyromegaly.  Cardiovascular:     Rate and Rhythm: Normal rate and regular rhythm.     Pulses: Normal pulses.     Heart sounds: Normal heart sounds. No murmur heard. Pulmonary:     Effort: Pulmonary effort is normal. No respiratory distress.     Breath sounds: Normal breath sounds. No wheezing, rhonchi or rales.  Abdominal:     General: Bowel  sounds are normal. There is no distension.     Palpations: Abdomen is soft. There is no mass.     Tenderness: There is no abdominal tenderness. There is no guarding or rebound.     Hernia: No hernia is present.  Musculoskeletal:     Cervical back: Normal range of motion and neck supple. No rigidity.     Right lower leg: No edema.     Left lower leg: No edema.  Lymphadenopathy:     Cervical: No cervical adenopathy.  Skin:    General: Skin is warm and dry.     Findings: No rash.  Neurological:     General: No focal deficit present.     Mental Status: She is alert. Mental status is at baseline.  Psychiatric:        Mood and Affect: Mood normal.        Behavior: Behavior normal.       Results for orders placed or performed in visit on 07/29/22  CBC with Differential/Platelet  Result Value Ref Range   WBC 6.5 3.8 - 10.8 Thousand/uL   RBC 4.77 3.80 - 5.10 Million/uL   Hemoglobin 15.7 (H) 11.7 - 15.5 g/dL   HCT 46.1 (H) 35.0 - 45.0 %   MCV 96.6 80.0 - 100.0 fL   MCH 32.9 27.0 - 33.0 pg   MCHC 34.1 32.0 - 36.0 g/dL   RDW 13.2 11.0 - 15.0 %   Platelets 194 140 - 400 Thousand/uL   MPV 11.3 7.5 - 12.5 fL   Neutro Abs 2,392 1,500 - 7,800 cells/uL   Lymphs Abs 2,743 850 - 3,900 cells/uL   Absolute Monocytes 943 200 - 950 cells/uL   Eosinophils Absolute 390 15 - 500 cells/uL   Basophils Absolute 33 0 - 200 cells/uL   Neutrophils Relative % 36.8 %   Total Lymphocyte 42.2 %   Monocytes Relative 14.5 %    Eosinophils Relative 6.0 %   Basophils Relative 0.5 %  Pathologist smear review  Result Value Ref Range   Path Review    T4, free  Result Value Ref Range   Free T4 0.89 0.60 - 1.60 ng/dL  Folate  Result Value Ref Range   Folate 19.2 >5.9 ng/mL  IBC panel  Result Value Ref Range   Iron 67 42 - 145 ug/dL   Transferrin 277.0 212.0 - 360.0 mg/dL   Saturation Ratios 17.3 (L) 20.0 - 50.0 %   TIBC 387.8 250.0 - 450.0 mcg/dL  Ferritin  Result Value Ref Range   Ferritin 60.3 10.0 - 291.0 ng/mL  Vitamin B12  Result Value Ref Range   Vitamin B-12 964 (H) 211 - 911 pg/mL  Hemoglobin A1c  Result Value Ref Range   Hgb A1c MFr Bld 6.3 4.6 - 6.5 %  TSH  Result Value Ref Range   TSH 3.25 0.35 - 5.50 uIU/mL  Comprehensive metabolic panel  Result Value Ref Range   Sodium 142 135 - 145 mEq/L   Potassium 4.4 3.5 - 5.1 mEq/L   Chloride 103 96 - 112 mEq/L   CO2 29 19 - 32 mEq/L   Glucose, Bld 101 (H) 70 - 99 mg/dL   BUN 19 6 - 23 mg/dL   Creatinine, Ser 0.85 0.40 - 1.20 mg/dL   Total Bilirubin 0.3 0.2 - 1.2 mg/dL   Alkaline Phosphatase 73 39 - 117 U/L   AST 23 0 - 37 U/L   ALT 25 0 - 35 U/L   Total Protein  6.9 6.0 - 8.3 g/dL   Albumin 4.4 3.5 - 5.2 g/dL   GFR 74.40 >60.00 mL/min   Calcium 9.6 8.4 - 10.5 mg/dL  Vitamin B1  Result Value Ref Range   Vitamin B1 (Thiamine) 11 8 - 30 nmol/L  VITAMIN D 25 Hydroxy (Vit-D Deficiency, Fractures)  Result Value Ref Range   VITD 22.61 (L) 30.00 - 100.00 ng/mL  Lipid panel  Result Value Ref Range   Cholesterol 119 0 - 200 mg/dL   Triglycerides 132.0 0.0 - 149.0 mg/dL   HDL 45.40 >39.00 mg/dL   VLDL 26.4 0.0 - 40.0 mg/dL   LDL Cholesterol 48 0 - 99 mg/dL   Total CHOL/HDL Ratio 3    NonHDL 74.02     Assessment & Plan:   Problem List Items Addressed This Visit     Health maintenance examination - Primary (Chronic)    Preventative protocols reviewed and updated unless pt declined. Discussed healthy diet and lifestyle.       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 restart vit D replacement 50k units weekly      Obesity, morbid, BMI 40.0-49.9 (HCC)    Encourage healthy diet and lifestyle choices to affect sustainable weight loss.  Tolerating Zepbound '5mg'$  weekly - increase to 7.'5mg'$  weekly.  She is paying out of pocket, >$1k/mo.       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 The ASCVD Risk score (Arnett DK, et al., 2019) failed to calculate for the following reasons:   The patient has a prior MI or stroke diagnosis       Relevant Medications   rosuvastatin (CRESTOR) 10 MG tablet   Vitamin D deficiency    Restart weekly vit D replacement .      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       Relevant Medications   fluconazole (DIFLUCAN) 150 MG tablet   Other Relevant Orders   Comprehensive metabolic panel   CBC with Differential/Platelet   Uric acid   Magnesium   Lactate Dehydrogenase   Prediabetes    A1c stable in prediabetes range.      Osteopenia    Latest DEXA reviewed - stable period.       Borderline hypothyroidism    TSH, fT4 normal. Continue to monitor.       Low serum vitamin B12    Levels high normal on MWF dosing.       Depression    Mild off medication.       Seasonal allergic rhinitis    Discussed nasal saline use.       Insomnia    Refill trazodone Rx PRN sleep.         Meds ordered this encounter  Medications   rosuvastatin (CRESTOR) 10 MG tablet    Sig: Take 1 tablet (10 mg total) by mouth daily.    Dispense:  90 tablet    Refill:  4    E78.5   DISCONTD: tirzepatide (ZEPBOUND) 7.5 MG/0.5ML Pen    Sig: Inject 7.5 mg into the skin once a week.    Dispense:  6 mL     Refill:  0   tirzepatide (ZEPBOUND) 7.5 MG/0.5ML Pen    Sig: Inject 7.5 mg into the skin once a week.    Dispense:  2 mL    Refill:  3   Cholecalciferol 1.25 MG (50000 UT) TABS    Sig: Take 1 tablet by mouth once a week.    Dispense:  12 tablet    Refill:  3    Orders Placed This Encounter  Procedures   Comprehensive metabolic panel    Standing Status:   Future    Standing Expiration Date:   08/06/2023   CBC with Differential/Platelet    Standing Status:   Future    Standing Expiration Date:   08/06/2023   Uric acid    Standing Status:   Future    Standing Expiration Date:   08/06/2023   Magnesium    Standing Status:   Future    Standing Expiration Date:   08/06/2023   Lactate Dehydrogenase    Standing Status:   Future    Standing Expiration Date:   08/06/2023    Patient Instructions  Debroah Baller dosis de Zepbound a 7.'5mg'$  semanalmente Comienze vitamina D3 semanal Irrigar con salina nasal regularmente como necesite.  Gusto verla hoy Regresar en 3 meses para proxima visita  Follow up plan: Return in about 3 months (around 11/04/2022) for follow up visit.  Ria Bush, MD

## 2022-08-05 NOTE — Assessment & Plan Note (Signed)
A1c stable in prediabetes range.

## 2022-08-05 NOTE — Assessment & Plan Note (Signed)
Preventative protocols reviewed and updated unless pt declined. Discussed healthy diet and lifestyle.  

## 2022-08-05 NOTE — Assessment & Plan Note (Addendum)
Recent labs stable. restart vit D replacement 50k units weekly

## 2022-08-05 NOTE — Assessment & Plan Note (Signed)
Encourage healthy diet and lifestyle choices to affect sustainable weight loss.  Tolerating Zepbound '5mg'$  weekly - increase to 7.'5mg'$  weekly.  She is paying out of pocket, >$1k/mo.

## 2022-08-05 NOTE — Assessment & Plan Note (Signed)
Sees neurology on CPAP

## 2022-08-05 NOTE — Assessment & Plan Note (Signed)
Levels high normal on MWF dosing.

## 2022-08-05 NOTE — Assessment & Plan Note (Signed)
TSH, fT4 normal. Continue to monitor.

## 2022-08-05 NOTE — Assessment & Plan Note (Signed)
Chronic, stable on crestor - continue. The ASCVD Risk score (Arnett DK, et al., 2019) failed to calculate for the following reasons:   The patient has a prior MI or stroke diagnosis

## 2022-08-09 ENCOUNTER — Other Ambulatory Visit: Payer: Self-pay | Admitting: Family Medicine

## 2022-08-09 NOTE — Telephone Encounter (Signed)
Dose increased to 7.5 mg wkly. Rx printed on 08/05/22, #2 mL/3. (See 08/05/22 OV notes.)

## 2022-08-12 ENCOUNTER — Ambulatory Visit: Payer: 59 | Admitting: Family Medicine

## 2022-12-04 ENCOUNTER — Ambulatory Visit: Payer: 59 | Admitting: Family Medicine

## 2022-12-06 ENCOUNTER — Ambulatory Visit: Payer: 59 | Admitting: Family Medicine

## 2022-12-10 ENCOUNTER — Ambulatory Visit: Payer: 59 | Admitting: Family Medicine

## 2022-12-10 ENCOUNTER — Encounter: Payer: Self-pay | Admitting: Family Medicine

## 2022-12-10 VITALS — BP 130/86 | HR 67 | Temp 97.6°F | Ht 62.5 in | Wt 225.1 lb

## 2022-12-10 DIAGNOSIS — J209 Acute bronchitis, unspecified: Secondary | ICD-10-CM | POA: Diagnosis not present

## 2022-12-10 DIAGNOSIS — M159 Polyosteoarthritis, unspecified: Secondary | ICD-10-CM

## 2022-12-10 DIAGNOSIS — Z9884 Bariatric surgery status: Secondary | ICD-10-CM | POA: Diagnosis not present

## 2022-12-10 DIAGNOSIS — Z6841 Body Mass Index (BMI) 40.0 and over, adult: Secondary | ICD-10-CM

## 2022-12-10 DIAGNOSIS — M1990 Unspecified osteoarthritis, unspecified site: Secondary | ICD-10-CM | POA: Insufficient documentation

## 2022-12-10 MED ORDER — METHOCARBAMOL 500 MG PO TABS: 500.0000 mg | ORAL_TABLET | Freq: Two times a day (BID) | ORAL | 0 refills | Status: DC | PRN

## 2022-12-10 MED ORDER — ZEPBOUND 7.5 MG/0.5ML ~~LOC~~ SOAJ: 7.5000 mg | SUBCUTANEOUS | 3 refills | Status: DC

## 2022-12-10 MED ORDER — ZEPBOUND 5 MG/0.5ML ~~LOC~~ SOAJ: 5.0000 mg | SUBCUTANEOUS | 0 refills | Status: DC

## 2022-12-10 MED ORDER — GUAIFENESIN-CODEINE 100-10 MG/5ML PO SYRP: 5.0000 mL | ORAL_SOLUTION | Freq: Three times a day (TID) | ORAL | 0 refills | Status: DC | PRN

## 2022-12-10 MED ORDER — NAPROXEN 500 MG PO TABS: 500.0000 mg | ORAL_TABLET | Freq: Every day | ORAL | 1 refills | Status: DC | PRN

## 2022-12-10 NOTE — Patient Instructions (Addendum)
Para tos - tome jarabe con codeina para de noche. Pare acetylcysteina y comienze mucinex guaifenesin fast relief con un gran vaso de agua.  Dejenos saber si no mejora en los proximos 3-5 dias. Siga zepbound - 5mg  semanal por 1 mes luego 7.5mg  semanal.  Regresar en 3 meses

## 2022-12-10 NOTE — Assessment & Plan Note (Signed)
H/o this.  Now on Tirzepatide as per below.

## 2022-12-10 NOTE — Assessment & Plan Note (Signed)
Anticipate viral given lack of fever, clear lungs on auscultation, and overall well appearing.  Supportive measure reviewed - rec start guaifenesin fast relief, Rx cheratussin cough syrup, update if not improving with this to consider Zoack abx course.

## 2022-12-10 NOTE — Progress Notes (Signed)
Ph: [phone] Fax: [phone]   Patient ID: Stacey Nichols, female    DOB: 1961/05/28, 62 y.o.   MRN: [id number]  This visit was conducted in person.  BP 130/86   Pulse 67   Temp 97.6 F (36.4 C) (Temporal)   Ht 5' 2.5" (1.588 m)   Wt 225 lb 2 oz (102.1 kg)   SpO2 96%   BMI 40.52 kg/m    CC: 4 mo DM f/u visit  Subjective:   HPI: Stacey Nichols is a 62 y.o. female presenting on 12/10/2022 for Medical Management of Chronic Issues (Here for 3 mo f/u.)   2 wk h/o chest congestion with productive cough.  No fevers/chills, ear or tooth pain, ST, Pndrainage, head congestion.  Treating with dayquil, nyquil, and acetylcysteine 600mg  BID.  No recent sick contacts.   No h/o asthma. No recent smoking.   H/o delayed reaction to IV contrast after CTA chest.  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   Bilateral knee surgery 2019 followed by [facility] Bim[facility] Dr Carlyle Basques.   Notes body aches, managed with naprosyn 500mg  daily with benefit. Requests trial muscle relaxant.   Starting weight: 251 lbs Last weight: 243 lbs Today's weight 225 lbs  Currently on Zepbound 7.5mg  weekly. Tolerating well without abd pain, nausea, diarrhea. Some constipation managed with OTC remedy.  No fmhx MTC or MEN2.  No personal h/o pancreatitis.  Recent trip to [place] ([place] and [place] - she's been off Zepbound for the past 1 week.   24 hour recall: Not done  Activity regimen: Walking 5 miles daily around neighborhood, 3 hours total per day       Relevant past medical, surgical, family and social history reviewed and updated as indicated. Interim medical history since our last visit reviewed. Allergies and medications reviewed and updated. Outpatient Medications Prior to Visit  Medication Sig Dispense Refill   acetaminophen (TYLENOL) 500 MG tablet Take 500 mg by mouth every 6 (six) hours as needed.     Acetylcysteine 600 MG TBCR Take by mouth.     aspirin 81 MG tablet Take 81  mg by mouth daily.     b complex vitamins capsule Take 1 capsule by mouth every Monday, Wednesday, and Friday.     Cholecalciferol 1.25 MG (50000 UT) TABS Take 1 tablet by mouth once a week. 12 tablet 3   Ibuprofen (ADVIL PO) Take by mouth.     MISC NATURAL PRODUCTS PO Take by mouth daily. Keto gummies     Multiple Vitamin (MULTIVITAMIN) tablet Take 1 tablet by mouth daily.     rosuvastatin (CRESTOR) 10 MG tablet Take 1 tablet (10 mg total) by mouth daily. 90 tablet 4   traZODone (DESYREL) 50 MG tablet TAKE 1/2 TO 1 TABLET BY MOUTH AT BEDTIME AS NEEDED FOR SLEEP 90 tablet 2   naproxen (NAPROSYN) 500 MG tablet TAKE 1 TABLET BY MOUTH TWICE A DAY FOR 7 DAYS THEN AS NEEDED FOR PAIN *TAKE WITH FOOD* 60 tablet 3   tirzepatide (ZEPBOUND) 7.5 MG/0.5ML Pen Inject 7.5 mg into the skin once a week. 2 mL 3   fluconazole (DIFLUCAN) 150 MG tablet Take 150 mg by mouth. Takes 1 tablet 2 times a week for 16 weeks     No facility-administered medications prior to visit.     Per HPI unless specifically indicated in ROS section below Review of Systems  Objective:  BP 130/86   Pulse 67   Temp  97.6 F (36.4 C) (Temporal)   Ht 5' 2.5" (1.588 m)   Wt 225 lb 2 oz (102.1 kg)   SpO2 96%   BMI 40.52 kg/m   Wt Readings from Last 3 Encounters:  12/10/22 225 lb 2 oz (102.1 kg)  08/05/22 243 lb 4 oz (110.3 kg)  05/10/22 251 lb 2 oz (113.9 kg)      Physical Exam Vitals and nursing note reviewed.  Constitutional:      Appearance: Normal appearance. She is not ill-appearing.  HENT:     Head: Normocephalic and atraumatic.     Right Ear: Tympanic membrane, ear canal and external ear normal. There is no impacted cerumen.     Left Ear: Tympanic membrane, ear canal and external ear normal. There is no impacted cerumen.     Nose: Nose normal. No congestion or rhinorrhea.     Mouth/Throat:     Mouth: Mucous membranes are moist.     Pharynx: Oropharynx is clear. No oropharyngeal exudate or posterior oropharyngeal  erythema.  Eyes:     Extraocular Movements: Extraocular movements intact.     Conjunctiva/sclera: Conjunctivae normal.     Pupils: Pupils are equal, round, and reactive to light.  Cardiovascular:     Rate and Rhythm: Normal rate and regular rhythm.     Pulses: Normal pulses.     Heart sounds: Normal heart sounds. No murmur heard. Pulmonary:     Effort: Pulmonary effort is normal. No respiratory distress.     Breath sounds: Normal breath sounds. No wheezing, rhonchi or rales.     Comments: Lungs clear, intermittent productive cough present Abdominal:     General: Bowel sounds are normal. There is no distension.     Palpations: Abdomen is soft. There is no mass.     Tenderness: There is no abdominal tenderness. There is no guarding or rebound.     Hernia: No hernia is present.  Musculoskeletal:     Right lower leg: No edema.     Left lower leg: No edema.  Lymphadenopathy:     Head:     Right side of head: No submental, submandibular, tonsillar, preauricular or posterior auricular adenopathy.     Left side of head: No submental, submandibular, tonsillar, preauricular or posterior auricular adenopathy.     Cervical: No cervical adenopathy.     Right cervical: No superficial cervical adenopathy.    Left cervical: No superficial cervical adenopathy.     Upper Body:     Right upper body: No supraclavicular adenopathy.     Left upper body: No supraclavicular adenopathy.  Skin:    Findings: No rash.  Neurological:     Mental Status: She is alert.  Psychiatric:        Mood and Affect: Mood normal.        Behavior: Behavior normal.       Results for orders placed or performed in visit on 07/29/22  CBC with Differential/Platelet  Result Value Ref Range   WBC 6.5 3.8 - 10.8 Thousand/uL   RBC 4.77 3.80 - 5.10 Million/uL   Hemoglobin 15.7 (H) 11.7 - 15.5 g/dL   HCT 16.1 (H) 09.6 - 04.5 %   MCV 96.6 80.0 - 100.0 fL   MCH 32.9 27.0 - 33.0 pg   MCHC 34.1 32.0 - 36.0 g/dL   RDW 40.9  81.1 - 91.4 %   Platelets 194 140 - 400 Thousand/uL   MPV 11.3 7.5 - 12.5 fL   Neutro Abs 2,392  1,500 - 7,800 cells/uL   Lymphs Abs 2,743 850 - 3,900 cells/uL   Absolute Monocytes 943 200 - 950 cells/uL   Eosinophils Absolute 390 15 - 500 cells/uL   Basophils Absolute 33 0 - 200 cells/uL   Neutrophils Relative % 36.8 %   Total Lymphocyte 42.2 %   Monocytes Relative 14.5 %   Eosinophils Relative 6.0 %   Basophils Relative 0.5 %  Pathologist smear review  Result Value Ref Range   Path Review    T4, free  Result Value Ref Range   Free T4 0.89 0.60 - 1.60 ng/dL  Folate  Result Value Ref Range   Folate 19.2 >5.9 ng/mL  IBC panel  Result Value Ref Range   Iron 67 42 - 145 ug/dL   Transferrin 161.0 960.4 - 360.0 mg/dL   Saturation Ratios 54.0 (L) 20.0 - 50.0 %   TIBC 387.8 250.0 - 450.0 mcg/dL  Ferritin  Result Value Ref Range   Ferritin 60.3 10.0 - 291.0 ng/mL  Vitamin B12  Result Value Ref Range   Vitamin B-12 964 (H) 211 - 911 pg/mL  Hemoglobin A1c  Result Value Ref Range   Hgb A1c MFr Bld 6.3 4.6 - 6.5 %  TSH  Result Value Ref Range   TSH 3.25 0.35 - 5.50 uIU/mL  Comprehensive metabolic panel  Result Value Ref Range   Sodium 142 135 - 145 mEq/L   Potassium 4.4 3.5 - 5.1 mEq/L   Chloride 103 96 - 112 mEq/L   CO2 29 19 - 32 mEq/L   Glucose, Bld 101 (H) 70 - 99 mg/dL   BUN 19 6 - 23 mg/dL   Creatinine, Ser 9.81 0.40 - 1.20 mg/dL   Total Bilirubin 0.3 0.2 - 1.2 mg/dL   Alkaline Phosphatase 73 39 - 117 U/L   AST 23 0 - 37 U/L   ALT 25 0 - 35 U/L   Total Protein 6.9 6.0 - 8.3 g/dL   Albumin 4.4 3.5 - 5.2 g/dL   GFR 19.14 >78.29 mL/min   Calcium 9.6 8.4 - 10.5 mg/dL  Vitamin B1  Result Value Ref Range   Vitamin B1 (Thiamine) 11 8 - 30 nmol/L  VITAMIN D 25 Hydroxy (Vit-D Deficiency, Fractures)  Result Value Ref Range   VITD 22.61 (L) 30.00 - 100.00 ng/mL  Lipid panel  Result Value Ref Range   Cholesterol 119 0 - 200 mg/dL   Triglycerides 562.1 0.0 - 149.0 mg/dL    HDL 30.86 >57.84 mg/dL   VLDL 69.6 0.0 - 29.5 mg/dL   LDL Cholesterol 48 0 - 99 mg/dL   Total CHOL/HDL Ratio 3    NonHDL 74.02     Assessment & Plan:   Problem List Items Addressed This Visit     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  Now on Tirzepatide as per below.       Obesity, morbid, BMI 40.0-49.9 (HCC)    Congratulated on 26 lb weight loss since starting zepbound!  Continue regular walking routine - up to 5 miles per day.  She's been off medication for the past month due to international travel, desires to restart. Will Rx Zepbound 5mg  weekly for 1 month then increase back to previous 7.5mg  dose. Caution with further titration in h/o gastric band surgery.  No fmhx MTC or MEN2, no personal h/o pancreatitis.      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  Supportive measure reviewed - rec start guaifenesin fast relief, Rx cheratussin cough syrup, update if not improving with this to consider Zoack abx course.      Osteoarthritis    Known h/o this, managed with naprosyn 500mg  daily. She gets benefit from daily use.  Reviewed risk of GI bleed, kidney injury with regular NSAID.  Will try PRN muscle relaxant in effort to decrease NSAID use.       Relevant Medications   methocarbamol (ROBAXIN) 500 MG tablet   naproxen (NAPROSYN) 500 MG tablet     Meds ordered this encounter  Medications   tirzepatide (ZEPBOUND) 5 MG/0.5ML Pen    Sig: Inject 5 mg into the skin once a week.    Dispense:  2 mL    Refill:  0   tirzepatide (ZEPBOUND) 7.5 MG/0.5ML Pen    Sig: Inject 7.5 mg into the skin once a week.    Dispense:  2 mL    Refill:  3   methocarbamol (ROBAXIN) 500 MG tablet    Sig: Take 1  tablet (500 mg total) by mouth 2 (two) times daily as needed for muscle spasms (sedation precautions).    Dispense:  40 tablet    Refill:  0   naproxen (NAPROSYN) 500 MG tablet    Sig: Take 1 tablet (500 mg total) by mouth daily as needed for moderate pain.    Dispense:  90 tablet    Refill:  1   guaiFENesin-codeine (ROBITUSSIN AC) 100-10 MG/5ML syrup    Sig: Take 5 mLs by mouth 3 (three) times daily as needed for cough.    Dispense:  120 mL    Refill:  0    No orders of the defined types were placed in this encounter.   Patient Instructions  Para tos - tome jarabe con codeina para de noche. Pare acetylcysteina y comienze mucinex guaifenesin fast relief con un gran vaso de agua.  Dejenos saber si no mejora en los proximos 3-5 dias. Siga zepbound - 5mg  semanal por 1 mes luego 7.5mg  semanal.  Regresar en 3 meses  Follow up plan: Return in about 3 months (around 03/12/2023) for follow up visit.  Eustaquio Boyden, MD

## 2022-12-10 NOTE — Assessment & Plan Note (Addendum)
Congratulated on 26 lb weight loss since starting zepbound!  Continue regular walking routine - up to 5 miles per day.  She's been off medication for the past month due to international travel, desires to restart. Will Rx Zepbound 5mg  weekly for 1 month then increase back to previous 7.5mg  dose. Caution with further titration in h/o gastric band surgery.  No fmhx MTC or MEN2, no personal h/o pancreatitis.

## 2022-12-10 NOTE — Assessment & Plan Note (Addendum)
Known h/o this, managed with naprosyn 500mg  daily. She gets benefit from daily use.  Reviewed risk of GI bleed, kidney injury with regular NSAID.  Will try PRN muscle relaxant in effort to decrease NSAID use.

## 2022-12-19 ENCOUNTER — Encounter: Payer: Self-pay | Admitting: Family Medicine

## 2022-12-20 ENCOUNTER — Ambulatory Visit
Admission: EM | Admit: 2022-12-20 | Discharge: 2022-12-20 | Disposition: A | Discharge status: Home / Self Care | Payer: 59 | Attending: Emergency Medicine | Admitting: Emergency Medicine

## 2022-12-20 ENCOUNTER — Ambulatory Visit (INDEPENDENT_AMBULATORY_CARE_PROVIDER_SITE_OTHER): Payer: 59

## 2022-12-20 DIAGNOSIS — N3000 Acute cystitis without hematuria: Secondary | ICD-10-CM | POA: Diagnosis not present

## 2022-12-20 DIAGNOSIS — J189 Pneumonia, unspecified organism: Secondary | ICD-10-CM | POA: Insufficient documentation

## 2022-12-20 DIAGNOSIS — J069 Acute upper respiratory infection, unspecified: Secondary | ICD-10-CM | POA: Insufficient documentation

## 2022-12-20 DIAGNOSIS — R0602 Shortness of breath: Secondary | ICD-10-CM | POA: Diagnosis not present

## 2022-12-20 DIAGNOSIS — R059 Cough, unspecified: Secondary | ICD-10-CM | POA: Diagnosis not present

## 2022-12-20 DIAGNOSIS — R062 Wheezing: Secondary | ICD-10-CM | POA: Diagnosis not present

## 2022-12-20 LAB — POCT URINALYSIS DIP (MANUAL ENTRY)
Bilirubin, UA: NEGATIVE
Glucose, UA: NEGATIVE mg/dL
Nitrite, UA: POSITIVE — AB
Protein Ur, POC: 100 mg/dL — AB
Spec Grav, UA: 1.02 (ref 1.010–1.025)
Urobilinogen, UA: 0.2 E.U./dL
pH, UA: 6 (ref 5.0–8.0)

## 2022-12-20 MED ORDER — PREDNISONE 20 MG PO TABS: 40.0000 mg | ORAL_TABLET | Freq: Every day | ORAL | 0 refills | Status: DC

## 2022-12-20 MED ORDER — NITROFURANTOIN MONOHYD MACRO 100 MG PO CAPS: 100.0000 mg | ORAL_CAPSULE | Freq: Two times a day (BID) | ORAL | 0 refills | Status: DC

## 2022-12-20 MED ORDER — FLUCONAZOLE 150 MG PO TABS: 150.0000 mg | ORAL_TABLET | Freq: Every day | ORAL | 0 refills | Status: AC

## 2022-12-20 MED ORDER — AMOXICILLIN-POT CLAVULANATE 875-125 MG PO TABS: 1.0000 | ORAL_TABLET | Freq: Two times a day (BID) | ORAL | 0 refills | Status: DC

## 2022-12-20 NOTE — ED Provider Notes (Signed)
[facility]    CSN: [id number] Arrival date & time: 12/20/22  1045      History   Chief Complaint Chief Complaint  Patient presents with   Cough   Urinary Frequency    HPI Stacey Nichols is a 62 y.o. female.   Presents for evaluation of nasal congestion, rhinorrhea and a productive cough with dark sputum present for 4 weeks.  Associated intermittent fevers.  Tolerating food and liquids.  Was evaluated by PCP on 11/20/2022, diagnosed with bronchitis, started on cough medication which has been minimally helpful as symptoms have continued to persist.  3 days ago began to experience lower abdominal pain, bilateral low back pain, urinary frequency and urgency.  Has not attempted treatment of new symptoms.  Denies hematuria, dysuria, vaginal symptoms.  No known respiratory history, non-smoker.  Spanish interpreter used  Past Medical History:  Diagnosis Date   AML (acute myeloblastic leukemia) (HCC) 2007   s/p chemo (Dr Rennis Harding)   Anemia    hx of   Anxiety    Arthritis    severe   Constipation    Depression    Diabetes mellitus without complication (HCC)    Fatty liver    GERD (gastroesophageal reflux disease)    H/O Bell's palsy    H/O hiatal hernia    Headache    occasional   High triglycerides    History of cancer chemotherapy    Leukemia (HCC)    Morbidly obese (HCC)    Osteopenia 06/08/2017   DEXA 2017 - T -1.8 spine   Pneumonia    hx of    Rheumatoid arthritis (HCC)    Sleep apnea    Stroke (HCC)    "mini stroke-caused bells palsy for 1 month"    Patient Active Problem List   Diagnosis Date Noted   Acute bronchitis 12/10/2022   Osteoarthritis 12/10/2022   Insomnia 01/30/2022   COVID-19 virus infection 07/04/2021   Right foot pain 01/08/2021   Polyp of ascending colon    Cough 07/10/2020   Depression 07/10/2020   History of Bell's palsy 07/10/2020   History of colitis 07/10/2020   History of stroke 07/10/2020   Low serum vitamin B12 07/08/2020    Dysuria 06/28/2020   Dry mouth 06/28/2020   Verruca vulgaris 06/28/2020   Borderline hypothyroidism 04/08/2020   Chest pressure 03/24/2020   Health maintenance examination 03/03/2018   NAFLD (nonalcoholic fatty liver disease) 40/98/1191   Preop examination 10/21/2017   Osteopenia 06/08/2017   Trigeminal neuralgia 05/01/2017   Prediabetes 05/27/2016   Primary osteoarthritis of both knees 09/29/2015   Environmental and seasonal allergies 09/29/2015   Arthralgia of hands, bilateral 07/12/2015   Arthralgia of hip 07/12/2015   Low back pain 07/12/2015   Dyslipidemia 06/20/2015   Vitamin D deficiency 06/20/2015   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) 06/20/2015   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"  01/10/2014   Obesity, morbid, BMI 40.0-49.9 (HCC) 01/10/2014   OSA (obstructive sleep apnea) 01/10/2014   Seasonal allergic rhinitis 09/30/2011    Past Surgical History:  Procedure Laterality Date   ABDOMINAL HYSTERECTOMY  2001   heavy bleeding, fibroids, 1 ovary remains   BLEPHAROPLASTY Bilateral 06/2020   in [place]   COLONOSCOPY WITH PROPOFOL N/A 09/19/2020   TA, int hem, rpt 7 yrs Servando Snare, Darren, MD)   FACIAL COSMETIC SURGERY  06/2020   in [place]   LAPAROSCOPIC GASTRIC BAND REMOVAL WITH LAPAROSCOPIC GASTRIC  SLEEVE RESECTION  04/26/2014   LAPAROSCOPIC GASTRIC BANDING  2000   performed in [place]   REPLACEMENT TOTAL KNEE Left 10/2017   Dr [facility]   REPLACEMENT TOTAL KNEE Right 06/2018   Dr [facility]   TUBAL LIGATION  1996   UPPER GI ENDOSCOPY N/A 04/26/2014   Procedure: UPPER GI ENDOSCOPY;  Surgeon: Valarie Merino, MD;  Location: [facility];  Service: General;  Laterality: N/A;    OB History     Gravida  2   Para  2   Term  2   Preterm      AB      Living  2      SAB      IAB      Ectopic      Multiple      Live Births               Home  Medications    Prior to Admission medications   Medication Sig Start Date End Date Taking? Authorizing Provider  acetaminophen (TYLENOL) 500 MG tablet Take 500 mg by mouth every 6 (six) hours as needed.   Yes [provider]  aspirin 81 MG tablet Take 81 mg by mouth daily.   Yes [provider]  b complex vitamins capsule Take 1 capsule by mouth every Monday, Wednesday, and Friday. 07/25/21  Yes Eustaquio Boyden, MD  Cholecalciferol 1.25 MG (50000 UT) TABS Take 1 tablet by mouth once a week. 08/05/22  Yes Eustaquio Boyden, MD  guaiFENesin-codeine [facility]) 100-10 MG/5ML syrup Take 5 mLs by mouth 3 (three) times daily as needed for cough. 12/10/22  Yes Eustaquio Boyden, MD  Ibuprofen (ADVIL PO) Take by mouth.   Yes [provider]  MISC NATURAL PRODUCTS PO Take by mouth daily. Keto gummies   Yes [provider]  Multiple Vitamin (MULTIVITAMIN) tablet Take 1 tablet by mouth daily.   Yes [provider]  rosuvastatin (CRESTOR) 10 MG tablet Take 1 tablet (10 mg total) by mouth daily. 08/05/22  Yes Eustaquio Boyden, MD  tirzepatide [facility]) 5 MG/0.5ML Pen Inject 5 mg into the skin once a week. 12/10/22  Yes Eustaquio Boyden, MD  tirzepatide [facility]) 7.5 MG/0.5ML Pen Inject 7.5 mg into the skin once a week. 01/07/23  Yes Eustaquio Boyden, MD  traZODone (DESYREL) 50 MG tablet TAKE 1/2 TO 1 TABLET BY MOUTH AT BEDTIME AS NEEDED FOR SLEEP 07/31/22  Yes Eustaquio Boyden, MD  Acetylcysteine 600 MG TBCR Take by mouth.    [provider]  methocarbamol (ROBAXIN) 500 MG tablet Take 1 tablet (500 mg total) by mouth 2 (two) times daily as needed for muscle spasms (sedation precautions). 12/10/22   Eustaquio Boyden, MD  naproxen (NAPROSYN) 500 MG tablet Take 1 tablet (500 mg total) by mouth daily as needed for moderate pain. 12/10/22   Eustaquio Boyden, MD    Family History Family History  Problem Relation Age of Onset   Cancer Mother         cervix   Hyperlipidemia Father    Obesity Father    CAD Father        MI and arrhythmia   Heart Problems Father    Cancer Sister        metastatic, ?pancrease primary   Diabetes Maternal Grandfather    Breast cancer Neg Hx     Social History Social History   Tobacco Use   Smoking status: Former    Years: 30  Types: Cigarettes    Quit date: 03/30/2018    Years since quitting: 4.7   Smokeless tobacco: Never  Vaping Use   Vaping Use: Never used  Substance Use Topics   Alcohol use: Yes    Comment: occasional   Drug use: No     Allergies   Latex, Sulfa antibiotics, and Tape   Review of Systems Review of Systems   Physical Exam Triage Vital Signs ED Triage Vitals  Enc Vitals Group     BP 12/20/22 1120 134/80     Pulse Rate 12/20/22 1120 (!) 110     Resp 12/20/22 1120 18     Temp 12/20/22 1120 98.3 F (36.8 C)     Temp Source 12/20/22 1120 Oral     SpO2 12/20/22 1120 96 %     Weight --      Height --      Head Circumference --      Peak Flow --      Pain Score 12/20/22 1121 9     Pain Loc --      Pain Edu? --      Excl. in GC? --    No data found.  Updated Vital Signs BP 134/80 (BP Location: Left Arm)   Pulse (!) 110   Temp 98.3 F (36.8 C) (Oral)   Resp 18   SpO2 96%   Visual Acuity Right Eye Distance:   Left Eye Distance:   Bilateral Distance:    Right Eye Near:   Left Eye Near:    Bilateral Near:     Physical Exam Constitutional:      Appearance: Normal appearance.  HENT:     Right Ear: Tympanic membrane, ear canal and external ear normal.     Left Ear: Tympanic membrane, ear canal and external ear normal.     Nose: Congestion present. No rhinorrhea.     Mouth/Throat:     Mouth: Mucous membranes are moist.     Pharynx: Oropharynx is clear. No posterior oropharyngeal erythema.  Eyes:     Extraocular Movements: Extraocular movements intact.  Cardiovascular:     Rate and Rhythm: Normal rate and regular rhythm.     Pulses: Normal  pulses.     Heart sounds: Normal heart sounds.  Pulmonary:     Effort: Pulmonary effort is normal.     Breath sounds: Normal breath sounds.  Abdominal:     General: Abdomen is flat. Bowel sounds are normal.     Palpations: Abdomen is soft.     Tenderness: There is abdominal tenderness in the suprapubic area.  Skin:    General: Skin is warm and dry.  Neurological:     Mental Status: She is alert and oriented to person, place, and time. Mental status is at baseline.      UC Treatments / Results  Labs (all labs ordered are listed, but only abnormal results are displayed) Labs Reviewed  POCT URINALYSIS DIP (MANUAL ENTRY) - Abnormal; Notable for the following components:      Result Value   Clarity, UA cloudy (*)    Ketones, POC UA trace (5) (*)    Blood, UA moderate (*)    Protein Ur, POC =100 (*)    Nitrite, UA Positive (*)    Leukocytes, UA Large (3+) (*)    All other components within normal limits  URINE CULTURE    EKG   Radiology No results found.  Procedures Procedures (including critical care time)  Medications  Ordered in UC Medications - No data to display  Initial Impression / Assessment and Plan / UC Course  I have reviewed the triage vital signs and the nursing notes.  Pertinent labs & imaging results that were available during my care of the patient were reviewed by me and considered in my medical decision making (see chart for details).  Community-acquired pneumonia of the right upper lobe acute cystitis without hematuria  Vital signs are stable patient is in no signs of distress, lungs clear to auscultation and O2 saturation 96% on room air, chest x-ray showing pneumonia developing  urinalysis showing leukocytes and nitrates, sent for culture, discussed with patient, Augmentin and Macrobid prescribed as well as prednisone, Diflucan sent prophylactically, additionally prescribed prednisone to further help assist with the airway and pain, may follow-up with  your primary doctor if symptoms persist or worsen Final Clinical Impressions(s) / UC Diagnoses   Final diagnoses:  Acute cystitis without hematuria  Acute upper respiratory infection   Discharge Instructions   None    ED Prescriptions   None    PDMP not reviewed this encounter.   Valinda Hoar, NP 12/20/22 1324

## 2022-12-20 NOTE — ED Triage Notes (Signed)
Pt was cough, with chest congestion started 4 weeks ago, chills, body aches, vomiting, urinary frequency, lower back pain and lower abdominal pain that started 3 days ago. Vomiting started today. Taking mucinx, dyquil, boiron oscillococcinum, Nyquil with no relief.

## 2022-12-20 NOTE — Discharge Instructions (Addendum)
Chest x-ray you are starting to get pneumonia  Begin use of Augmentin every morning and every evening for 7 days  Prednisone every morning with food for 5 days to open and relax the airway, this medicine also helps with pain May take Tylenol 500 mg every 6 hours in addition to this    You can take Tylenol and/or Ibuprofen as needed for fever reduction and pain relief.   For cough: honey 1/2 to 1 teaspoon (you can dilute the honey in water or another fluid).  You can also use guaifenesin and dextromethorphan for cough. You can use a humidifier for chest congestion and cough.  If you don't have a humidifier, you can sit in the bathroom with the hot shower running.      For sore throat: try warm salt water gargles, cepacol lozenges, throat spray, warm tea or water with lemon/honey, popsicles or ice, or OTC cold relief medicine for throat discomfort.   For congestion: take a daily anti-histamine like Zyrtec, Claritin, and a oral decongestant, such as pseudoephedrine.  You can also use Flonase 1-2 sprays in each nostril daily.   It is important to stay hydrated: drink plenty of fluids (water, gatorade/powerade/pedialyte, juices, or teas) to keep your throat moisturized and help further relieve irritation/discomfort.   Your urinalysis shows Mazal Ebey blood cells and nitrates which are indicative of infection, your urine will be sent to the lab to determine exactly which bacteria is present, if any changes need to be made to your medications you will be notified  Begin use of Macrobid  twice daily for 5 days  You may use over-the-counter Azo to help minimize your symptoms until antibiotic removes bacteria, this medication will turn your urine orange  Increase your fluid intake through use of water  As always practice good hygiene, wiping front to back and avoidance of scented vaginal products to prevent further irritation  If symptoms continue to persist after use of medication or recur please  follow-up with urgent care or your primary doctor as needed  For any vaginal itching you may take Diflucan to get rid of yeast

## 2022-12-21 LAB — URINE CULTURE: Culture: >=100000 — AB

## 2022-12-22 LAB — URINE CULTURE

## 2022-12-23 LAB — URINE CULTURE

## 2022-12-24 MED ORDER — AZITHROMYCIN 250 MG PO TABS
ORAL_TABLET | ORAL | 0 refills | Status: DC
Start: 2022-12-24 — End: 2023-03-12

## 2022-12-24 NOTE — Telephone Encounter (Signed)
Spoke with patient - seen at [facility] dx with pneumonia + E coli UTI treated with macrobid and augmentin + prednisone.  She has 2 days left of treatment. She initially felt better but now notes recurrent productive cough.  Will add zpack antibiotic to fully treat possible CAP.

## 2022-12-31 ENCOUNTER — Encounter: Payer: Self-pay | Admitting: Family Medicine

## 2023-01-06 DIAGNOSIS — J40 Bronchitis, not specified as acute or chronic: Secondary | ICD-10-CM | POA: Diagnosis not present

## 2023-01-06 DIAGNOSIS — R0989 Other specified symptoms and signs involving the circulatory and respiratory systems: Secondary | ICD-10-CM | POA: Diagnosis not present

## 2023-02-12 ENCOUNTER — Encounter (INDEPENDENT_AMBULATORY_CARE_PROVIDER_SITE_OTHER): Payer: Self-pay

## 2023-03-12 ENCOUNTER — Ambulatory Visit: Payer: 59 | Admitting: Family Medicine

## 2023-03-12 ENCOUNTER — Encounter: Payer: Self-pay | Admitting: Family Medicine

## 2023-03-12 DIAGNOSIS — Z87891 Personal history of nicotine dependence: Secondary | ICD-10-CM | POA: Diagnosis not present

## 2023-03-12 DIAGNOSIS — Z9884 Bariatric surgery status: Secondary | ICD-10-CM

## 2023-03-12 DIAGNOSIS — J302 Other seasonal allergic rhinitis: Secondary | ICD-10-CM | POA: Diagnosis not present

## 2023-03-12 DIAGNOSIS — Z23 Encounter for immunization: Secondary | ICD-10-CM | POA: Diagnosis not present

## 2023-03-12 DIAGNOSIS — E785 Hyperlipidemia, unspecified: Secondary | ICD-10-CM | POA: Diagnosis not present

## 2023-03-12 DIAGNOSIS — C9201 Acute myeloblastic leukemia, in remission: Secondary | ICD-10-CM

## 2023-03-12 DIAGNOSIS — J988 Other specified respiratory disorders: Secondary | ICD-10-CM

## 2023-03-12 DIAGNOSIS — L659 Nonscarring hair loss, unspecified: Secondary | ICD-10-CM

## 2023-03-12 LAB — IBC PANEL
Iron: 124 ug/dL (ref 42–145)
Saturation Ratios: 32.3 % (ref 20.0–50.0)
TIBC: 383.6 ug/dL (ref 250.0–450.0)
Transferrin: 274 mg/dL (ref 212.0–360.0)

## 2023-03-12 LAB — COMPREHENSIVE METABOLIC PANEL
ALT: 17 U/L (ref 0–35)
AST: 16 U/L (ref 0–37)
Albumin: 4.1 g/dL (ref 3.5–5.2)
Alkaline Phosphatase: 67 U/L (ref 39–117)
BUN: 20 mg/dL (ref 6–23)
CO2: 32 mEq/L (ref 19–32)
Calcium: 9.2 mg/dL (ref 8.4–10.5)
Chloride: 104 mEq/L (ref 96–112)
Creatinine, Ser: 0.78 mg/dL (ref 0.40–1.20)
GFR: 82.13 mL/min (ref >60.00)
Glucose, Bld: 93 mg/dL (ref 70–99)
Potassium: 4.3 mEq/L (ref 3.5–5.1)
Sodium: 142 mEq/L (ref 135–145)
Total Bilirubin: 0.5 mg/dL (ref 0.2–1.2)
Total Protein: 6.6 g/dL (ref 6.0–8.3)

## 2023-03-12 LAB — CBC WITH DIFFERENTIAL/PLATELET
Basophils Absolute: 0 10*3/uL (ref 0.0–0.1)
Basophils Relative: 0.6 % (ref 0.0–3.0)
Eosinophils Absolute: 0.4 10*3/uL (ref 0.0–0.7)
Eosinophils Relative: 5.9 % — ABNORMAL HIGH (ref 0.0–5.0)
HCT: 46.1 % — ABNORMAL HIGH (ref 36.0–46.0)
Hemoglobin: 15 g/dL (ref 12.0–15.0)
Lymphocytes Relative: 38.4 % (ref 12.0–46.0)
Lymphs Abs: 2.3 10*3/uL (ref 0.7–4.0)
MCHC: 32.5 g/dL (ref 30.0–36.0)
MCV: 98.7 fl (ref 78.0–100.0)
Monocytes Absolute: 0.6 10*3/uL (ref 0.1–1.0)
Monocytes Relative: 9.9 % (ref 3.0–12.0)
Neutro Abs: 2.8 10*3/uL (ref 1.4–7.7)
Neutrophils Relative %: 45.2 % (ref 43.0–77.0)
Platelets: 177 10*3/uL (ref 150.0–400.0)
RBC: 4.67 Mil/uL (ref 3.87–5.11)
RDW: 13.8 % (ref 11.5–15.5)
WBC: 6.1 10*3/uL (ref 4.0–10.5)

## 2023-03-12 LAB — MAGNESIUM: Magnesium: 1.8 mg/dL (ref 1.5–2.5)

## 2023-03-12 LAB — URIC ACID: Uric Acid, Serum: 5.1 mg/dL (ref 2.4–7.0)

## 2023-03-12 LAB — FERRITIN: Ferritin: 87.8 ng/mL (ref 10.0–291.0)

## 2023-03-12 LAB — TSH: TSH: 3.24 u[IU]/mL (ref 0.35–5.50)

## 2023-03-12 MED ORDER — ROSUVASTATIN CALCIUM 10 MG PO TABS: 10.0000 mg | ORAL_TABLET | Freq: Every day | ORAL | 1 refills | Status: DC

## 2023-03-12 MED ORDER — ALBUTEROL SULFATE HFA 108 (90 BASE) MCG/ACT IN AERS: 2.0000 | INHALATION_SPRAY | Freq: Four times a day (QID) | RESPIRATORY_TRACT | 2 refills | Status: DC | PRN

## 2023-03-12 MED ORDER — ZEPBOUND 10 MG/0.5ML ~~LOC~~ SOAJ: 10.0000 mg | SUBCUTANEOUS | 3 refills | Status: DC

## 2023-03-12 MED ORDER — NAPROXEN 500 MG PO TABS: 500.0000 mg | ORAL_TABLET | Freq: Every day | ORAL | 1 refills | Status: DC | PRN

## 2023-03-12 MED ORDER — CHOLECALCIFEROL 1.25 MG (50000 UT) PO TABS: 1.0000 | ORAL_TABLET | ORAL | 1 refills | Status: DC

## 2023-03-12 NOTE — Assessment & Plan Note (Signed)
25 lb weight loss since starting Tirzepatide, tolerating well without significant side effects.  Currently on 7.5mg  dose, notes decreased effect on appetite.   Will increase dose to 10mg  weekly.  RTC 2.5 months f/u visit

## 2023-03-12 NOTE — Patient Instructions (Addendum)
Flu shot today Labs today  Comienze flonase intranasal se puede comprar sin receta. La remitiremos a Art therapist.  Suba dosis de tirzepatide a 10mg  semanal.  Gusto verla hoy.  Regresar en 2-3 meses para seguimiento de Zepbound.

## 2023-03-12 NOTE — Assessment & Plan Note (Signed)
Rec trial of flonase

## 2023-03-12 NOTE — Assessment & Plan Note (Signed)
Several back to back evaluations for acute bronchitis followed by possible pneumonia this summer. Notes ongoing chest congestion, lungs clear on exam. In smoking history, reasonable to evaluate for COPD with spirometry. We don't have spirometer at our office. Will refer to pulmonology in [facility] per pt request for further evaluation.

## 2023-03-12 NOTE — Assessment & Plan Note (Deleted)
Rec start flonase.

## 2023-03-12 NOTE — Progress Notes (Signed)
Ph: [phone] Fax: [phone]   Patient ID: Stacey Nichols, female    DOB: 1961-04-17, 62 y.o.   MRN: [id number]  This visit was conducted in person.  BP 132/74   Pulse 72   Temp (!) 97.5 F (36.4 C)   Ht 5\' 2"  (1.575 m)   Wt 226 lb 9.6 oz (102.8 kg)   SpO2 95%   BMI 41.45 kg/m    CC: 3 mo f/u visit weight visit Subjective:   HPI: Stacey Nichols is a 62 y.o. female presenting on 03/12/2023 for Follow-up   Several rounds of treatment for recent acute bronchitis 11/2022 - first evaluated in office  12/10/2022 (codeine cough syrup, fast relief guaifenesin) followed [facility] 12/20/2022 (UTI treated with macrobid and respiratory infection treated with augmentin + prednisone, we added zpack treatment as well to cover pneumonia) then [facility] 01/06/2023 (treated with phenergan cough syrup, albuterol inhaler, doxycycline 10d course).   Over last few days developing nasal congestion. No fevers/chills, cough, dyspnea or wheeze. She notes chronic chest congestion in the mornings as well since she's been sick, is able to cough this up well.  No known h/o asthma, COPD.  Ex smoker - quit 3 yrs ago.   Notes progressive hair loss over the past 4 months. This is despite regular collagen and biotin use.   Medical weight management: Starting weight: 251 lbs Last weight: 225 lbs Today's weight 226 lbs  Currently on Zepbound 7.5mg  weekly. Tolerating well without side effects. Occ loose stools.   24 hour recall: Not done  Activity regimen: Limited due to above respiratory issues. Continues walking 2-3 days a week, walking 20 min 1-2 miles.      Relevant past medical, surgical, family and social history reviewed and updated as indicated. Interim medical history since our last visit reviewed. Allergies and medications reviewed and updated. Outpatient Medications Prior to Visit  Medication Sig Dispense Refill   acetaminophen (TYLENOL) 500 MG tablet Take 500 mg by  mouth every 6 (six) hours as needed.     aspirin 81 MG tablet Take 81 mg by mouth daily.     b complex vitamins capsule Take 1 capsule by mouth every Monday, Wednesday, and Friday.     Ibuprofen (ADVIL PO) Take by mouth.     MISC NATURAL PRODUCTS PO Take by mouth daily. Keto gummies     Multiple Vitamin (MULTIVITAMIN) tablet Take 1 tablet by mouth daily.     traZODone (DESYREL) 50 MG tablet TAKE 1/2 TO 1 TABLET BY MOUTH AT BEDTIME AS NEEDED FOR SLEEP 90 tablet 2   Cholecalciferol 1.25 MG (50000 UT) TABS Take 1 tablet by mouth once a week. 12 tablet 3   naproxen (NAPROSYN) 500 MG tablet Take 1 tablet (500 mg total) by mouth daily as needed for moderate pain. 90 tablet 1   rosuvastatin (CRESTOR) 10 MG tablet Take 1 tablet (10 mg total) by mouth daily. 90 tablet 4   tirzepatide (ZEPBOUND) 7.5 MG/0.5ML Pen Inject 7.5 mg into the skin once a week. 2 mL 3   Acetylcysteine 600 MG TBCR Take by mouth.     amoxicillin-clavulanate (AUGMENTIN) 875-125 MG tablet Take 1 tablet by mouth every 12 (twelve) hours. 14 tablet 0   azithromycin (ZITHROMAX) 250 MG tablet Take two tablets on day one followed by one tablet on days 2-5 6 each 0   guaiFENesin-codeine (ROBITUSSIN AC) 100-10 MG/5ML syrup Take 5 mLs by mouth 3 (three) times daily as  needed for cough. 120 mL 0   methocarbamol (ROBAXIN) 500 MG tablet Take 1 tablet (500 mg total) by mouth 2 (two) times daily as needed for muscle spasms (sedation precautions). 40 tablet 0   predniSONE (DELTASONE) 20 MG tablet Take 2 tablets (40 mg total) by mouth daily. 10 tablet 0   tirzepatide (ZEPBOUND) 5 MG/0.5ML Pen Inject 5 mg into the skin once a week. 2 mL 0   No facility-administered medications prior to visit.     Per HPI unless specifically indicated in ROS section below Review of Systems  Objective:  BP 132/74   Pulse 72   Temp (!) 97.5 F (36.4 C)   Ht 5\' 2"  (1.575 m)   Wt 226 lb 9.6 oz (102.8 kg)   SpO2 95%   BMI 41.45 kg/m   Wt Readings from Last 3  Encounters:  03/12/23 226 lb 9.6 oz (102.8 kg)  12/10/22 225 lb 2 oz (102.1 kg)  08/05/22 243 lb 4 oz (110.3 kg)      Physical Exam Vitals and nursing note reviewed.  Constitutional:      Appearance: Normal appearance. She is not ill-appearing.  HENT:     Head: Normocephalic and atraumatic.     Right Ear: Tympanic membrane, ear canal and external ear normal. There is no impacted cerumen.     Left Ear: Tympanic membrane, ear canal and external ear normal. There is no impacted cerumen.     Nose: Congestion present.     Mouth/Throat:     Mouth: Mucous membranes are moist.     Pharynx: Oropharynx is clear. No oropharyngeal exudate or posterior oropharyngeal erythema.  Eyes:     Extraocular Movements: Extraocular movements intact.     Conjunctiva/sclera: Conjunctivae normal.     Pupils: Pupils are equal, round, and reactive to light.  Cardiovascular:     Rate and Rhythm: Normal rate and regular rhythm.     Pulses: Normal pulses.     Heart sounds: Normal heart sounds. No murmur heard. Pulmonary:     Effort: Pulmonary effort is normal. No respiratory distress.     Breath sounds: Normal breath sounds. No wheezing, rhonchi or rales.     Comments: Lungs clear  Lymphadenopathy:     Head:     Right side of head: No submental, submandibular, tonsillar, preauricular or posterior auricular adenopathy.     Left side of head: No submental, submandibular, tonsillar, preauricular or posterior auricular adenopathy.     Cervical: No cervical adenopathy.     Right cervical: No superficial cervical adenopathy.    Left cervical: No superficial cervical adenopathy.     Upper Body:     Right upper body: No supraclavicular adenopathy.     Left upper body: No supraclavicular adenopathy.  Skin:    Findings: No rash.  Neurological:     Mental Status: She is alert.  Psychiatric:        Mood and Affect: Mood normal.        Behavior: Behavior normal.       Results for orders placed or performed  during the hospital encounter of 12/20/22  Urine Culture   Specimen: Urine, Clean Catch  Result Value Ref Range   Specimen Description URINE, CLEAN CATCH    Special Requests      NONE Performed at [facility], [address], [place], [place] [address]    Culture >=100,000 COLONIES/mL ESCHERICHIA COLI (A)    Report Status 12/23/2022 FINAL  Organism ID, Bacteria ESCHERICHIA COLI (A)       Susceptibility   Escherichia coli - MIC*    AMPICILLIN >=32 RESISTANT Resistant     CEFAZOLIN <=4 SENSITIVE Sensitive     CEFEPIME <=0.12 SENSITIVE Sensitive     CEFTRIAXONE <=0.25 SENSITIVE Sensitive     CIPROFLOXACIN <=0.25 SENSITIVE Sensitive     GENTAMICIN <=1 SENSITIVE Sensitive     IMIPENEM <=0.25 SENSITIVE Sensitive     NITROFURANTOIN <=16 SENSITIVE Sensitive     TRIMETH/SULFA >=320 RESISTANT Resistant     AMPICILLIN/SULBACTAM 4 SENSITIVE Sensitive     PIP/TAZO <=4 SENSITIVE Sensitive     * >=100,000 COLONIES/mL ESCHERICHIA COLI  POCT urinalysis dipstick  Result Value Ref Range   Color, UA yellow yellow   Clarity, UA cloudy (A) clear   Glucose, UA negative negative mg/dL   Bilirubin, UA negative negative   Ketones, POC UA trace (5) (A) negative mg/dL   Spec Grav, UA 1.610 9.604 - 1.025   Blood, UA moderate (A) negative   pH, UA 6.0 5.0 - 8.0   Protein Ur, POC =100 (A) negative mg/dL   Urobilinogen, UA 0.2 0.2 or 1.0 E.U./dL   Nitrite, UA Positive (A) Negative   Leukocytes, UA Large (3+) (A) Negative    Assessment & Plan:   Problem List Items Addressed This Visit     Obesity, morbid, BMI 40.0-49.9 (HCC) - Primary    25 lb weight loss since starting Tirzepatide, tolerating well without significant side effects.  Currently on 7.5mg  dose, notes decreased effect on appetite.   Will increase dose to 10mg  weekly.  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 Update labs.       Relevant Medications   naproxen (NAPROSYN) 500 MG tablet   Recurrent respiratory infection    Several back to back evaluations for acute bronchitis followed by possible pneumonia this summer. Notes ongoing chest congestion, lungs clear on exam. In smoking history, reasonable to evaluate for COPD with spirometry. We don't have spirometer at our office. Will refer to pulmonology in [facility] per pt request for further evaluation.       Relevant Orders   Ambulatory referral to Pulmonology   Seasonal allergic rhinitis    Rec trial of flonase       Ex-smoker   Relevant Orders   Ambulatory referral to Pulmonology   Other Visit Diagnoses     Need for influenza vaccination       Relevant Orders   Flu vaccine trivalent PF, 6mos and older(Flulaval,Afluria,Fluarix,Fluzone) (Completed)   Hair loss       Relevant Orders   Ferritin   IBC panel   TSH        Meds ordered this encounter  Medications   naproxen (NAPROSYN) 500 MG tablet    Sig: Take 1 tablet (500 mg total) by mouth daily as needed for moderate pain.    Dispense:  90 tablet    Refill:  1   rosuvastatin (CRESTOR) 10 MG tablet    Sig: Take 1 tablet (10 mg total) by mouth daily.    Dispense:  90 tablet    Refill:  1   Cholecalciferol 1.25 MG (50000 UT) TABS  Sig: Take 1 tablet by mouth once a week.    Dispense:  12 tablet    Refill:  1   tirzepatide (ZEPBOUND) 10 MG/0.5ML Pen    Sig: Inject 10 mg into the skin once a week.    Dispense:  2 mL    Refill:  3    Note new dose   albuterol (VENTOLIN HFA) 108 (90 Base) MCG/ACT inhaler    Sig: Inhale 2 puffs into the lungs every 6 (six) hours as needed for wheezing or shortness of breath.    Dispense:  8 g    Refill:  2    Orders Placed This Encounter  Procedures   Flu vaccine trivalent PF, 6mos and  older(Flulaval,Afluria,Fluarix,Fluzone)   Ferritin   IBC panel   TSH   Ambulatory referral to Pulmonology    Referral Priority:   Routine    Referral Type:   Consultation    Referral Reason:   Specialty Services Required    Requested Specialty:   Pulmonary Disease    Number of Visits Requested:   1    Patient Instructions  Flu shot today Labs today  Comienze flonase intranasal se puede comprar sin receta. La remitiremos a Art therapist.  Suba dosis de tirzepatide a 10mg  semanal.  Gusto verla hoy.  Regresar en 2-3 meses para seguimiento de Zepbound.   Follow up plan: Return in about 10 weeks (around 05/21/2023), or if symptoms worsen or fail to improve, for follow up visit.  Eustaquio Boyden, MD

## 2023-03-12 NOTE — Assessment & Plan Note (Signed)
H/o this. Update labs.

## 2023-03-13 ENCOUNTER — Other Ambulatory Visit: Payer: Self-pay | Admitting: Family Medicine

## 2023-03-13 LAB — LACTATE DEHYDROGENASE: LDH: 132 U/L (ref 120–250)

## 2023-03-14 ENCOUNTER — Other Ambulatory Visit (HOSPITAL_COMMUNITY): Payer: Self-pay

## 2023-03-14 NOTE — Telephone Encounter (Signed)
Plz submit PA for Zepbound 10 mg.

## 2023-03-14 NOTE — Telephone Encounter (Signed)
Per test claim, Zepbound is not covered by pt's insurance due to plan/benefit exclusion, no PA submitted at this time, please see below:     I received the same plan/benefit exclusion message when I tried to run a [facility] test claim as well, seems plan does not cover weight loss drugs.   Please sign off on rx in this encounter as PA team is unable to resolve RX requests. Thank you

## 2023-03-14 NOTE — Telephone Encounter (Signed)
I believe pt pays out of pocket for this as insurance did not cover it.  Would have her check on online discount coupons

## 2023-03-18 NOTE — Telephone Encounter (Signed)
Spoke with pt asking if she's still paying out of pocket for Zepbound. Pt confirms she is. I relayed Dr Timoteo Expose message about online coupons. Pt verbalizes understanding and will check into that.

## 2023-04-10 ENCOUNTER — Ambulatory Visit
Admission: RE | Admit: 2023-04-10 | Discharge: 2023-04-10 | Disposition: A | Discharge status: Home / Self Care | Payer: 59 | Source: Ambulatory Visit | Attending: Pulmonary Disease | Admitting: Pulmonary Disease

## 2023-04-10 ENCOUNTER — Other Ambulatory Visit
Admission: RE | Admit: 2023-04-10 | Discharge: 2023-04-10 | Disposition: A | Discharge status: Home / Self Care | Payer: 59 | Source: Ambulatory Visit | Attending: Pulmonary Disease | Admitting: Pulmonary Disease

## 2023-04-10 ENCOUNTER — Ambulatory Visit: Payer: 59 | Admitting: Pulmonary Disease

## 2023-04-10 ENCOUNTER — Encounter: Payer: Self-pay | Admitting: Pulmonary Disease

## 2023-04-10 VITALS — BP 126/84 | HR 79 | Temp 97.6°F | Ht 62.0 in | Wt 227.8 lb

## 2023-04-10 DIAGNOSIS — J189 Pneumonia, unspecified organism: Secondary | ICD-10-CM

## 2023-04-10 DIAGNOSIS — K219 Gastro-esophageal reflux disease without esophagitis: Secondary | ICD-10-CM

## 2023-04-10 MED ORDER — ALBUTEROL SULFATE HFA 108 (90 BASE) MCG/ACT IN AERS
2.0000 | INHALATION_SPRAY | Freq: Four times a day (QID) | RESPIRATORY_TRACT | 2 refills | Status: DC | PRN
Start: 2023-04-10 — End: 2023-06-19

## 2023-04-10 MED ORDER — PANTOPRAZOLE SODIUM 40 MG PO TBEC
40.0000 mg | DELAYED_RELEASE_TABLET | Freq: Every day | ORAL | 6 refills | Status: AC
Start: 2023-04-10 — End: ?

## 2023-04-10 NOTE — Progress Notes (Signed)
Synopsis: Referred in by Eustaquio Boyden, MD   Subjective:   PATIENT ID: Stacey Nichols GENDER: female DOB: 09-Jun-1961, MRN: [id number]  Chief Complaint  Patient presents with   Consult    Frequent chest congestion in the morning. Occasional dry cough. Occasional wheezing at night only.     HPI Stacey Nichols is a pleasant 62 year old female patient Spanish-speaking originally from [place] presenting to the pulmonary office as a referral from her primary care physician for recurrent pneumonias.  She reports that she has been having URIs for the past year associated with pneumonia and each time it is more severe.  She describes wheezing chest tightness during those episodes cough with sputum production.  Outside those episodes she denies any shortness of breath she denies any cough but does report acid reflux symptoms.  She does report allergic rhinitis seasonal no eczema.  She denies ever receiving a diagnosis of asthma.  Family history she denies any family history of lung diseases  Social history very light smoker in the past but quit 4 years now.  Drinks alcohol very rarely.  On the supermarket.  She is originally from [place].  Does not have any pets at home.  She has 1 son who has asthma.  ROS All symptoms were reviewed and are negative except for the above. Objective:   Vitals:   04/10/23 0838  BP: 126/84  Pulse: 79  Temp: 97.6 F (36.4 C)  TempSrc: Temporal  SpO2: 95%  Weight: 227 lb 12.8 oz (103.3 kg)  Height: 5\' 2"  (1.575 m)   95% on RA BMI Readings from Last 3 Encounters:  04/10/23 41.67 kg/m  03/12/23 41.45 kg/m  12/10/22 40.52 kg/m   Wt Readings from Last 3 Encounters:  04/10/23 227 lb 12.8 oz (103.3 kg)  03/12/23 226 lb 9.6 oz (102.8 kg)  12/10/22 225 lb 2 oz (102.1 kg)    Physical Exam GEN: NAD, obese HEENT: Supple Neck, Reactive Pupils, EOMI  CVS: Normal S1, Normal S2, RRR, No murmurs or ES appreciated  Lungs: Clear bilateral air entry.  Abdomen:  Soft, non tender, non distended, + BS  Extremities: Warm and well perfused, No edema  Skin: No suspicious lesions appreciated  Psych: Normal Affect  Ancillary Information   CBC    Component Value Date/Time   WBC 6.1 03/12/2023 0916   RBC 4.67 03/12/2023 0916   HGB 15.0 03/12/2023 0916   HGB 14.5 09/04/2017 1055   HCT 46.1 (H) 03/12/2023 0916   HCT 42.8 09/04/2017 1055   PLT 177.0 03/12/2023 0916   PLT 214 06/20/2015 1141   MCV 98.7 03/12/2023 0916   MCV 98 (H) 09/04/2017 1055   MCV 96 01/15/2013 0522   MCH 32.9 07/29/2022 0732   MCHC 32.5 03/12/2023 0916   RDW 13.8 03/12/2023 0916   RDW 13.8 09/04/2017 1055   RDW 14.3 01/15/2013 0522   LYMPHSABS 2.3 03/12/2023 0916   LYMPHSABS 2.3 09/04/2017 1055   LYMPHSABS 3.6 01/15/2013 0522   MONOABS 0.6 03/12/2023 0916   MONOABS 0.8 01/15/2013 0522   EOSABS 0.4 03/12/2023 0916   EOSABS 0.2 09/04/2017 1055   EOSABS 0.3 01/15/2013 0522   BASOSABS 0.0 03/12/2023 0916   BASOSABS 0.0 09/04/2017 1055   BASOSABS 0.0 01/15/2013 0522    Imaging  CXR 01/19/2023: Suggestion of diffuse bronchial thickening. Additional density in the right perihilar lung may be consistent with a subtle developing pneumonia.  Eos: 400      No data to display  Assessment & Plan:  Stacey Nichols is a pleasant 62 year old female patient Spanish-speaking originally from [place] presenting to the pulmonary office as a referral from her primary care physician for recurrent pneumonias.  #Recurrent pneumonias/URI with significant shortness of breath. Possibly secondary to intermittent asthma, no signs of bronchiectasis on CT. CVID is a possibility however less likely as no prior history.   #Possible Intermittent asthma - Significant wheezing with URI. Elevated Eos on Labs.  #GERD  #Morbid Obesity   []  PFTs  []  Immunoglobulin panel  []  Albuterol 2puffs Q6H as needed for shortness of breath.  []  Pantoprazole 40mg  1 tab PO for GERD  []  Advised on  lifestyle modifications and importance of weightloss.   Return in about 3 months (around 07/10/2023).  I spent 60 minutes caring for this patient today, including preparing to see the patient, obtaining a medical history , reviewing a separately obtained history, performing a medically appropriate examination and/or evaluation, counseling and educating the patient/family/caregiver, ordering medications, tests, or procedures, documenting clinical information in the electronic health record, and independently interpreting results (not separately reported/billed) and communicating results to the patient/family/caregiver  Janann Colonel, MD [facility] 04/10/2023 11:06 AM

## 2023-04-11 DIAGNOSIS — L299 Pruritus, unspecified: Secondary | ICD-10-CM | POA: Diagnosis not present

## 2023-04-11 DIAGNOSIS — Z6841 Body Mass Index (BMI) 40.0 and over, adult: Secondary | ICD-10-CM | POA: Diagnosis not present

## 2023-04-11 DIAGNOSIS — Z789 Other specified health status: Secondary | ICD-10-CM | POA: Diagnosis not present

## 2023-04-11 LAB — IGG, IGA, IGM
IgA: 323 mg/dL (ref 87–352)
IgG (Immunoglobin G), Serum: 860 mg/dL (ref 586–1602)
IgM (Immunoglobulin M), Srm: 123 mg/dL (ref 26–217)

## 2023-04-14 ENCOUNTER — Encounter: Payer: Self-pay | Admitting: Pulmonary Disease

## 2023-05-20 LAB — HM DIABETES EYE EXAM

## 2023-05-21 ENCOUNTER — Encounter: Payer: Self-pay | Admitting: Family Medicine

## 2023-05-21 ENCOUNTER — Ambulatory Visit: Payer: 59 | Admitting: Family Medicine

## 2023-05-21 DIAGNOSIS — M25541 Pain in joints of right hand: Secondary | ICD-10-CM

## 2023-05-21 DIAGNOSIS — M170 Bilateral primary osteoarthritis of knee: Secondary | ICD-10-CM

## 2023-05-21 DIAGNOSIS — M25542 Pain in joints of left hand: Secondary | ICD-10-CM

## 2023-05-21 DIAGNOSIS — M25552 Pain in left hip: Secondary | ICD-10-CM

## 2023-05-21 DIAGNOSIS — M2550 Pain in unspecified joint: Secondary | ICD-10-CM

## 2023-05-21 DIAGNOSIS — R7303 Prediabetes: Secondary | ICD-10-CM | POA: Diagnosis not present

## 2023-05-21 DIAGNOSIS — L659 Nonscarring hair loss, unspecified: Secondary | ICD-10-CM

## 2023-05-21 DIAGNOSIS — C9201 Acute myeloblastic leukemia, in remission: Secondary | ICD-10-CM

## 2023-05-21 DIAGNOSIS — Z9884 Bariatric surgery status: Secondary | ICD-10-CM

## 2023-05-21 DIAGNOSIS — M150 Primary generalized (osteo)arthritis: Secondary | ICD-10-CM

## 2023-05-21 DIAGNOSIS — E559 Vitamin D deficiency, unspecified: Secondary | ICD-10-CM

## 2023-05-21 DIAGNOSIS — G8929 Other chronic pain: Secondary | ICD-10-CM

## 2023-05-21 DIAGNOSIS — K760 Fatty (change of) liver, not elsewhere classified: Secondary | ICD-10-CM

## 2023-05-21 DIAGNOSIS — J988 Other specified respiratory disorders: Secondary | ICD-10-CM

## 2023-05-21 LAB — C-REACTIVE PROTEIN: CRP: <1 mg/dL (ref 0.5–20.0)

## 2023-05-21 LAB — HEMOGLOBIN A1C: Hgb A1c MFr Bld: 6 % (ref 4.6–6.5)

## 2023-05-21 LAB — CK: Total CK: 62 U/L (ref 7–177)

## 2023-05-21 LAB — VITAMIN D 25 HYDROXY (VIT D DEFICIENCY, FRACTURES): VITD: 37.19 ng/mL (ref 30.00–100.00)

## 2023-05-21 LAB — SEDIMENTATION RATE: Sed Rate: 14 mm/h (ref 0–30)

## 2023-05-21 MED ORDER — ZEPBOUND 12.5 MG/0.5ML ~~LOC~~ SOAJ: 12.5000 mg | SUBCUTANEOUS | 6 refills | Status: DC

## 2023-05-21 NOTE — Patient Instructions (Addendum)
Laboratorios hoy  Haremos sonograma para evaluar higado Multimedia programmer.  Subamos dosis de zepbound a 12.5mg  semanal. Revise precio en [facility] o farmacia siguiente:  [employer] [facility] PAY FOR Stacey Nichols [place], [place] - [address] [phone]    Regresar en 6-8 semanas

## 2023-05-21 NOTE — Progress Notes (Unsigned)
Ph: [phone] Fax: [phone]   Patient ID: Stacey Nichols, female    DOB: 1961/06/11, 62 y.o.   MRN: [id number]  This visit was conducted in person.  BP 118/84   Pulse 79   Temp 98.6 F (37 C) (Oral)   Ht 5\' 2"  (1.575 m)   Wt 222 lb 8 oz (100.9 kg)   SpO2 96%   BMI 40.70 kg/m    CC: weight management visit  Subjective:   HPI: Stacey Nichols is a 62 y.o. female presenting on 05/21/2023 for Medical Management of Chronic Issues (Here for 10 wk wt mgmt f/u.)   Saw pulm Dr [name] for recurrent pneumonia - ?related to intermittent asthma but without bronchiectasis on CT. Planned PFTs, Ig panel, rec albuterol PRN and pantoprazole 40mg  daily, f/u planned 06/2023. Doing better on current regimen.   Starting weight: 251 lbs Last weight: 226 lbs Today's weight 222 lbs  Continues Zepbound 10mg  weekly, tolerating well without significant side effects. Notes loss of effect of medication, would be interested in titration.   24 hour recall: Intermittent fasting  Water throughout the day  12pm breakfast first meal - 2 eggs with chicharron, coffee.  5pm snack - almond milk shake with protein and small cup of fruit 8pm dinner - ground beef with green beans, carrots, potatoes, sugar free flavored water   Activity regimen: Walking 5d/week, 3 miles 90 min   Notes worsening joint pains - back, hips, muscles of legs.  No shoulder or neck pain.  She continues naprosyn 500mg  daily.  Notes bitemporal headache over the past week - managing with tylenol and naprosyn. No vision changes.  Notes morning stiffness to joints, better as day progresses.  No rashes, fever, abd pain, conjunctivitis.  S/p bilat TKR 2019 - knees feel well.   Notes progressive hair loss despite regularly taking collagen and biotin.  H/o fatty liver on prior imaging.      Relevant past medical, surgical, family and social history reviewed and updated as indicated. Interim medical history since our last visit  reviewed. Allergies and medications reviewed and updated. Outpatient Medications Prior to Visit  Medication Sig Dispense Refill   acetaminophen (TYLENOL) 500 MG tablet Take 500 mg by mouth every 6 (six) hours as needed.     albuterol (VENTOLIN HFA) 108 (90 Base) MCG/ACT inhaler Inhale 2 puffs into the lungs every 6 (six) hours as needed for wheezing or shortness of breath. 8 g 2   aspirin 81 MG tablet Take 81 mg by mouth daily.     b complex vitamins capsule Take 1 capsule by mouth every Monday, Wednesday, and Friday.     Ibuprofen (ADVIL PO) Take by mouth.     Multiple Vitamin (MULTIVITAMIN) tablet Take 1 tablet by mouth daily.     naproxen (NAPROSYN) 500 MG tablet Take 1 tablet (500 mg total) by mouth daily as needed for moderate pain. 90 tablet 1   pantoprazole (PROTONIX) 40 MG tablet Take 1 tablet (40 mg total) by mouth daily. 30 tablet 6   rosuvastatin (CRESTOR) 10 MG tablet Take 1 tablet (10 mg total) by mouth daily. 90 tablet 1   traZODone (DESYREL) 50 MG tablet TAKE 1/2 TO 1 TABLET BY MOUTH AT BEDTIME AS NEEDED FOR SLEEP 90 tablet 2   albuterol (VENTOLIN HFA) 108 (90 Base) MCG/ACT inhaler Inhale 2 puffs into the lungs every 6 (six) hours as needed for wheezing or shortness of breath. 8 g 2   Cholecalciferol 1.25 MG (  50000 UT) TABS Take 1 tablet by mouth once a week. 12 tablet 1   tirzepatide (ZEPBOUND) 10 MG/0.5ML Pen INJECT 10 MG INTO THE SKIN ONE TIME PER WEEK 2 mL 3   No facility-administered medications prior to visit.     Per HPI unless specifically indicated in ROS section below Review of Systems  Objective:  BP 118/84   Pulse 79   Temp 98.6 F (37 C) (Oral)   Ht 5\' 2"  (1.575 m)   Wt 222 lb 8 oz (100.9 kg)   SpO2 96%   BMI 40.70 kg/m   Wt Readings from Last 3 Encounters:  05/21/23 222 lb 8 oz (100.9 kg)  04/10/23 227 lb 12.8 oz (103.3 kg)  03/12/23 226 lb 9.6 oz (102.8 kg)      Physical Exam Vitals and nursing note reviewed.  Constitutional:      Appearance:  Normal appearance. She is not ill-appearing.  HENT:     Mouth/Throat:     Mouth: Mucous membranes are moist.     Pharynx: Oropharynx is clear. No oropharyngeal exudate or posterior oropharyngeal erythema.  Eyes:     Extraocular Movements: Extraocular movements intact.     Conjunctiva/sclera: Conjunctivae normal.     Pupils: Pupils are equal, round, and reactive to light.  Cardiovascular:     Rate and Rhythm: Normal rate and regular rhythm.     Pulses: Normal pulses.     Heart sounds: Normal heart sounds. No murmur heard. Pulmonary:     Effort: Pulmonary effort is normal. No respiratory distress.     Breath sounds: Normal breath sounds. No wheezing, rhonchi or rales.  Abdominal:     General: Bowel sounds are normal. There is no distension.     Palpations: Abdomen is soft. There is no mass.     Tenderness: There is generalized abdominal tenderness (mild). There is no guarding or rebound. Negative signs include Murphy's sign.     Hernia: No hernia is present.  Musculoskeletal:     Right lower leg: No edema.     Left lower leg: No edema.     Comments:  No pain midline spine Discomfort to palpation of left lower lumbar paraspinous mm  Neg SLR bilaterally. No pain with int/ext rotation at hip. No significant pain at SIJ  Skin:    General: Skin is warm and dry.     Findings: No rash.  Neurological:     Mental Status: She is alert.  Psychiatric:        Mood and Affect: Mood normal.        Behavior: Behavior normal.       Results for orders placed or performed in visit on 05/21/23  Sedimentation rate  Result Value Ref Range   Sed Rate 14 0 - 30 mm/hr  VITAMIN D 25 Hydroxy (Vit-D Deficiency, Fractures)  Result Value Ref Range   VITD 37.19 30.00 - 100.00 ng/mL  C-reactive protein  Result Value Ref Range   CRP <1.0 0.5 - 20.0 mg/dL  Hemoglobin Z6X  Result Value Ref Range   Hgb A1c MFr Bld 6.0 4.6 - 6.5 %  CK  Result Value Ref Range   Total CK 62 7 - 177 U/L    Lab  Results  Component Value Date   VITAMINB12 964 (H) 07/29/2022   Lab Results  Component Value Date   TSH 3.24 03/12/2023   T3TOTAL 137 01/21/2017   T4TOTAL 7.5 03/01/2014   Lab Results  Component Value Date  CHOL 119 07/29/2022   HDL 45.40 07/29/2022   LDLCALC 48 07/29/2022   LDLDIRECT 47.0 06/28/2020   TRIG 132.0 07/29/2022   CHOLHDL 3 07/29/2022    Assessment & Plan:   Problem List Items Addressed This Visit     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  Weight loss has plateaued  Notes loss of effect of 10mg  dose - will continue titration to 12.5mg  daily.  24 hour dietary recall reviewed.  RTC 6 wks f/u visit.       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  [facility]ided [facility] vs local pharmacy.  Update A1c      Relevant Orders   Hemoglobin A1c (Completed)   Insulin, random   NAFLD (nonalcoholic fatty liver disease)    H/o this, LFTs normal.  Update abd [place].       Relevant Orders   US Abdomen Complete   Recurrent respiratory infection    Appreciate pulmonology care - pending PFTs.  Doing well on daily PPI and PRN rescue albuterol inhaler      Osteoarthritis   Polyarthralgia    Chronic, longstanding to back, hips, hands, leg myalgias. No significant shoulder or neck stiffness.  She does endorse am stiffness and worse pain in am. She is currently managing this with naprosyn 500mg  once daily. No SIJ pain.  Will further evaluate today for inflammatory arthritis, PMR with labs including inflammatory markers, ANA, RF. In statin use, check CK.  Pending results, could consider Celebrex.   She had reassuring coronary calcium score of zero 2017. She was started on crestor at that time for aggressive risk reduction. Consider holding statin to evaluate effect on polyarthralgias.       Relevant Orders   Sedimentation rate (Completed)   C-reactive protein (Completed)   CK (Completed)   ANA   Rheumatoid factor   Other Visit Diagnoses     Hair loss            Meds ordered this encounter  Medications   DISCONTD: tirzepatide (ZEPBOUND) 12.5 MG/0.5ML Pen    Sig: Inject 12.5 mg into the skin once a week.    Dispense:  2 mL    Refill:  6   tirzepatide (ZEPBOUND) 12.5 MG/0.5ML Pen    Sig: Inject 12.5 mg into the skin once a week.    Dispense:  2 mL    Refill:  6   Biotin 5 MG CAPS    Sig: Take 1 capsule (5 mg total) by mouth daily.   Cholecalciferol 1.25 MG (50000 UT) TABS    Sig: Take 1 tablet by mouth once a week.    Dispense:  12 tablet    Refill:  1  Orders Placed This Encounter  Procedures   US Abdomen Complete    Standing Status:   Future    Standing Expiration Date:   05/21/2024    Order Specific Question:   Reason for Exam (SYMPTOM  OR DIAGNOSIS REQUIRED)    Answer:   fatty liver, h/o AML    Order Specific Question:   Preferred imaging location?    Answer:   [facility]   Sedimentation rate   VITAMIN D 25 Hydroxy (Vit-D Deficiency, Fractures)   C-reactive protein   Hemoglobin A1c   Insulin, random   CK   ANA   Rheumatoid factor    Patient Instructions  Laboratorios hoy  Haremos sonograma para evaluar higado [employer].  Subamos dosis de zepbound a 12.5mg  semanal. Revise precio en [facility] o farmacia siguiente:  [facility] [place], [place] - [address] [phone]    Regresar en 6-8 semanas   Follow up plan: Return in about 6 weeks (around 07/02/2023) for follow up visit.  Eustaquio Boyden, MD

## 2023-05-22 ENCOUNTER — Encounter: Payer: Self-pay | Admitting: Family Medicine

## 2023-05-22 DIAGNOSIS — M2550 Pain in unspecified joint: Secondary | ICD-10-CM | POA: Insufficient documentation

## 2023-05-22 MED ORDER — CHOLECALCIFEROL 1.25 MG (50000 UT) PO TABS: 1.0000 | ORAL_TABLET | ORAL | 1 refills | Status: DC

## 2023-05-22 MED ORDER — BIOTIN 5 MG PO CAPS: 1.0000 | ORAL_CAPSULE | Freq: Every day | ORAL | Status: DC

## 2023-05-22 NOTE — Assessment & Plan Note (Signed)
Close to 30lb weight weight loss since starting tirzepatide ~07/2022.  Weight loss has plateaued  Notes loss of effect of 10mg  dose - will continue titration to 12.5mg  daily.  24 hour dietary recall reviewed.  RTC 6 wks f/u visit.

## 2023-05-22 NOTE — Assessment & Plan Note (Signed)
H/o this 2007

## 2023-05-22 NOTE — Assessment & Plan Note (Addendum)
Chronic, longstanding to back, hips, hands, leg myalgias. No significant shoulder or neck stiffness. She does endorse am stiffness and worse pain in am. She is currently managing this with naprosyn 500mg  once daily. No SIJ pain.  Will further evaluate today for inflammatory arthritis, PMR with labs including inflammatory markers, ANA, RF. In statin use, check CK.  Pending results, could consider Celebrex.   She had reassuring coronary calcium score of zero 2017. She was started on crestor at that time for aggressive risk reduction. Consider holding statin to evaluate effect on polyarthralgias.

## 2023-05-22 NOTE — Assessment & Plan Note (Signed)
H/o this, LFTs normal.  Update abd [place].

## 2023-05-22 NOTE — Assessment & Plan Note (Addendum)
Appreciate pulmonology care - pending PFTs.  Doing well on daily PPI and PRN rescue albuterol inhaler

## 2023-05-22 NOTE — Assessment & Plan Note (Addendum)
Update vit D levels on Rx weekly D3 replacement.

## 2023-05-22 NOTE — Assessment & Plan Note (Signed)
She is frustrated with cost of Zepbound.  Provided with [facility] to [facility] vs local pharmacy.  Update A1c

## 2023-05-26 ENCOUNTER — Other Ambulatory Visit: Payer: Self-pay | Admitting: Family Medicine

## 2023-05-28 ENCOUNTER — Encounter: Payer: Self-pay | Admitting: *Deleted

## 2023-05-31 LAB — ANTI-NUCLEAR AB-TITER (ANA TITER): ANA Titer 1: 1:80 {titer} — ABNORMAL HIGH

## 2023-05-31 LAB — INSULIN, RANDOM: Insulin: 28.3 u[IU]/mL — ABNORMAL HIGH

## 2023-05-31 LAB — RHEUMATOID FACTOR: Rheumatoid fact SerPl-aCnc: <10 [IU]/mL (ref <14)

## 2023-05-31 LAB — ANA: Anti Nuclear Antibody (ANA): POSITIVE — AB

## 2023-06-19 ENCOUNTER — Other Ambulatory Visit: Payer: Self-pay | Admitting: Family Medicine

## 2023-06-19 DIAGNOSIS — J189 Pneumonia, unspecified organism: Secondary | ICD-10-CM

## 2023-07-02 ENCOUNTER — Ambulatory Visit: Payer: 59 | Admitting: Family Medicine

## 2023-07-03 ENCOUNTER — Encounter: Payer: Self-pay | Admitting: Family Medicine

## 2023-08-11 ENCOUNTER — Ambulatory Visit: Payer: Medicaid Other | Admitting: Family Medicine

## 2023-09-04 ENCOUNTER — Other Ambulatory Visit: Payer: Self-pay | Admitting: Pulmonary Disease

## 2023-09-04 DIAGNOSIS — J189 Pneumonia, unspecified organism: Secondary | ICD-10-CM

## 2023-09-08 ENCOUNTER — Other Ambulatory Visit: Payer: Self-pay | Admitting: Family Medicine

## 2023-09-08 DIAGNOSIS — E785 Hyperlipidemia, unspecified: Secondary | ICD-10-CM

## 2023-09-21 ENCOUNTER — Other Ambulatory Visit: Payer: Self-pay | Admitting: Family Medicine

## 2023-09-21 DIAGNOSIS — C9201 Acute myeloblastic leukemia, in remission: Secondary | ICD-10-CM

## 2023-09-21 DIAGNOSIS — E538 Deficiency of other specified B group vitamins: Secondary | ICD-10-CM

## 2023-09-21 DIAGNOSIS — Z9884 Bariatric surgery status: Secondary | ICD-10-CM

## 2023-09-21 DIAGNOSIS — R7303 Prediabetes: Secondary | ICD-10-CM

## 2023-09-21 DIAGNOSIS — E559 Vitamin D deficiency, unspecified: Secondary | ICD-10-CM

## 2023-09-21 DIAGNOSIS — E039 Hypothyroidism, unspecified: Secondary | ICD-10-CM

## 2023-09-21 DIAGNOSIS — E785 Hyperlipidemia, unspecified: Secondary | ICD-10-CM

## 2023-09-24 ENCOUNTER — Other Ambulatory Visit: Payer: Medicaid Other

## 2023-09-24 DIAGNOSIS — E039 Hypothyroidism, unspecified: Secondary | ICD-10-CM | POA: Diagnosis not present

## 2023-09-24 DIAGNOSIS — Z9884 Bariatric surgery status: Secondary | ICD-10-CM

## 2023-09-24 DIAGNOSIS — C9201 Acute myeloblastic leukemia, in remission: Secondary | ICD-10-CM

## 2023-09-24 DIAGNOSIS — R7303 Prediabetes: Secondary | ICD-10-CM

## 2023-09-24 DIAGNOSIS — E559 Vitamin D deficiency, unspecified: Secondary | ICD-10-CM | POA: Diagnosis not present

## 2023-09-24 DIAGNOSIS — E785 Hyperlipidemia, unspecified: Secondary | ICD-10-CM

## 2023-09-24 DIAGNOSIS — E538 Deficiency of other specified B group vitamins: Secondary | ICD-10-CM

## 2023-09-24 LAB — IBC PANEL
Iron: 72 ug/dL (ref 42–145)
Saturation Ratios: 18.6 % — ABNORMAL LOW (ref 20.0–50.0)
TIBC: 387.8 ug/dL (ref 250.0–450.0)
Transferrin: 277 mg/dL (ref 212.0–360.0)

## 2023-09-24 LAB — TSH: TSH: 4.8 u[IU]/mL (ref 0.35–5.50)

## 2023-09-24 LAB — CBC WITH DIFFERENTIAL/PLATELET
Basophils Absolute: 0 10*3/uL (ref 0.0–0.1)
Basophils Relative: 0.4 % (ref 0.0–3.0)
Eosinophils Absolute: 0.2 10*3/uL (ref 0.0–0.7)
Eosinophils Relative: 3.5 % (ref 0.0–5.0)
HCT: 43.6 % (ref 36.0–46.0)
Hemoglobin: 14.5 g/dL (ref 12.0–15.0)
Lymphocytes Relative: 48.5 % — ABNORMAL HIGH (ref 12.0–46.0)
Lymphs Abs: 2.5 10*3/uL (ref 0.7–4.0)
MCHC: 33.2 g/dL (ref 30.0–36.0)
MCV: 100.5 fl — ABNORMAL HIGH (ref 78.0–100.0)
Monocytes Absolute: 0.5 10*3/uL (ref 0.1–1.0)
Monocytes Relative: 9.7 % (ref 3.0–12.0)
Neutro Abs: 1.9 10*3/uL (ref 1.4–7.7)
Neutrophils Relative %: 37.9 % — ABNORMAL LOW (ref 43.0–77.0)
Platelets: 173 10*3/uL (ref 150.0–400.0)
RBC: 4.34 Mil/uL (ref 3.87–5.11)
RDW: 14.1 % (ref 11.5–15.5)
WBC: 5.1 10*3/uL (ref 4.0–10.5)

## 2023-09-24 LAB — COMPREHENSIVE METABOLIC PANEL
ALT: 39 U/L — ABNORMAL HIGH (ref 0–35)
AST: 28 U/L (ref 0–37)
Albumin: 4.1 g/dL (ref 3.5–5.2)
Alkaline Phosphatase: 105 U/L (ref 39–117)
BUN: 23 mg/dL (ref 6–23)
CO2: 27 meq/L (ref 19–32)
Calcium: 9.5 mg/dL (ref 8.4–10.5)
Chloride: 104 meq/L (ref 96–112)
Creatinine, Ser: 0.92 mg/dL (ref 0.40–1.20)
GFR: 67.11 mL/min (ref >60.00)
Glucose, Bld: 212 mg/dL — ABNORMAL HIGH (ref 70–99)
Potassium: 4 meq/L (ref 3.5–5.1)
Sodium: 141 meq/L (ref 135–145)
Total Bilirubin: 0.3 mg/dL (ref 0.2–1.2)
Total Protein: 6.4 g/dL (ref 6.0–8.3)

## 2023-09-24 LAB — HEMOGLOBIN A1C: Hgb A1c MFr Bld: 6.2 % (ref 4.6–6.5)

## 2023-09-24 LAB — T4, FREE: Free T4: 0.78 ng/dL (ref 0.60–1.60)

## 2023-09-24 LAB — LIPID PANEL
Cholesterol: 166 mg/dL (ref 0–200)
HDL: 38.5 mg/dL — ABNORMAL LOW (ref >39.00)
LDL Cholesterol: 48 mg/dL (ref 0–99)
NonHDL: 127.49
Total CHOL/HDL Ratio: 4
Triglycerides: 398 mg/dL — ABNORMAL HIGH (ref 0.0–149.0)
VLDL: 79.6 mg/dL — ABNORMAL HIGH (ref 0.0–40.0)

## 2023-09-24 LAB — FOLATE: Folate: 21.7 ng/mL (ref >5.9)

## 2023-09-24 LAB — FERRITIN: Ferritin: 112.2 ng/mL (ref 10.0–291.0)

## 2023-09-24 LAB — VITAMIN D 25 HYDROXY (VIT D DEFICIENCY, FRACTURES): VITD: 30.42 ng/mL (ref 30.00–100.00)

## 2023-09-24 LAB — VITAMIN B12: Vitamin B-12: 629 pg/mL (ref 211–911)

## 2023-09-26 LAB — FRUCTOSAMINE: Fructosamine: 237 umol/L (ref 205–285)

## 2023-09-26 LAB — T3: T3, Total: 109 ng/dL (ref 76–181)

## 2023-09-26 LAB — LACTATE DEHYDROGENASE: LDH: 155 U/L (ref 120–250)

## 2023-09-26 LAB — INSULIN, RANDOM: Insulin: 541.8 u[IU]/mL — ABNORMAL HIGH

## 2023-09-29 LAB — VITAMIN B1: Vitamin B1 (Thiamine): 15 nmol/L (ref 8–30)

## 2023-10-01 ENCOUNTER — Ambulatory Visit: Payer: Medicaid Other | Admitting: Family Medicine

## 2023-10-01 ENCOUNTER — Telehealth: Payer: Self-pay | Admitting: Family Medicine

## 2023-10-01 VITALS — BP 120/74 | HR 99 | Temp 98.2°F | Ht 62.5 in | Wt 236.0 lb

## 2023-10-01 DIAGNOSIS — J988 Other specified respiratory disorders: Secondary | ICD-10-CM

## 2023-10-01 DIAGNOSIS — C9201 Acute myeloblastic leukemia, in remission: Secondary | ICD-10-CM

## 2023-10-01 DIAGNOSIS — M25552 Pain in left hip: Secondary | ICD-10-CM

## 2023-10-01 DIAGNOSIS — M150 Primary generalized (osteo)arthritis: Secondary | ICD-10-CM

## 2023-10-01 DIAGNOSIS — E559 Vitamin D deficiency, unspecified: Secondary | ICD-10-CM

## 2023-10-01 DIAGNOSIS — K219 Gastro-esophageal reflux disease without esophagitis: Secondary | ICD-10-CM

## 2023-10-01 DIAGNOSIS — Z0001 Encounter for general adult medical examination with abnormal findings: Secondary | ICD-10-CM

## 2023-10-01 DIAGNOSIS — Z8673 Personal history of transient ischemic attack (TIA), and cerebral infarction without residual deficits: Secondary | ICD-10-CM

## 2023-10-01 DIAGNOSIS — K760 Fatty (change of) liver, not elsewhere classified: Secondary | ICD-10-CM

## 2023-10-01 DIAGNOSIS — M25531 Pain in right wrist: Secondary | ICD-10-CM

## 2023-10-01 DIAGNOSIS — M2550 Pain in unspecified joint: Secondary | ICD-10-CM

## 2023-10-01 DIAGNOSIS — E161 Other hypoglycemia: Secondary | ICD-10-CM

## 2023-10-01 DIAGNOSIS — E039 Hypothyroidism, unspecified: Secondary | ICD-10-CM

## 2023-10-01 DIAGNOSIS — E785 Hyperlipidemia, unspecified: Secondary | ICD-10-CM | POA: Diagnosis not present

## 2023-10-01 DIAGNOSIS — Z9884 Bariatric surgery status: Secondary | ICD-10-CM

## 2023-10-01 DIAGNOSIS — G4733 Obstructive sleep apnea (adult) (pediatric): Secondary | ICD-10-CM

## 2023-10-01 DIAGNOSIS — M85852 Other specified disorders of bone density and structure, left thigh: Secondary | ICD-10-CM

## 2023-10-01 DIAGNOSIS — E8881 Metabolic syndrome: Secondary | ICD-10-CM

## 2023-10-01 DIAGNOSIS — R7303 Prediabetes: Secondary | ICD-10-CM

## 2023-10-01 MED ORDER — CHOLECALCIFEROL 1.25 MG (50000 UT) PO TABS: 1.0000 | ORAL_TABLET | ORAL | 4 refills | Status: AC

## 2023-10-01 MED ORDER — ROSUVASTATIN CALCIUM 10 MG PO TABS
10.0000 mg | ORAL_TABLET | ORAL | 3 refills | Status: DC
Start: 2023-10-02 — End: 2024-03-26

## 2023-10-01 MED ORDER — B COMPLEX VITAMINS PO CAPS: 1.0000 | ORAL_CAPSULE | ORAL | Status: AC

## 2023-10-01 NOTE — Assessment & Plan Note (Signed)
 Preventative protocols reviewed and updated unless pt declined. Discussed healthy diet and lifestyle.

## 2023-10-01 NOTE — Patient Instructions (Addendum)
 Call to schedule mammogram at your convenience: [name] at [facility] [phone] Llamar a Dr [name] para hacer cita de seguimiento [phone] Trate ejercicios para dolor de cadera y Stacey Nichols. Regresar la semana entrante para mas laboratorios.

## 2023-10-01 NOTE — Telephone Encounter (Signed)
 Pt needs Prevnar-20 Please call to schedule nurse visit for next week for this. Immunization order signed and in Lisa's box.

## 2023-10-01 NOTE — Progress Notes (Unsigned)
 Ph: [phone] Fax: [phone]   Patient ID: Stacey Nichols, female    DOB: 1960/12/12, 63 y.o.   MRN: [id number]  This visit was conducted in person.  BP 120/74   Pulse 99   Temp 98.2 F (36.8 C) (Oral)   Ht 5' 2.5" (1.588 m)   Wt 236 lb (107 kg)   SpO2 94%   BMI 42.48 kg/m    CC: CPE  Subjective:   HPI: Stacey Nichols is a 63 y.o. female presenting on 10/01/2023 for Annual Exam   Went to [place] with son 07/2023.  She did have diarrheal illness with mucous after trip. Took bermox anti-parasitic - with benefit  H/o delayed reaction to IV contrast after CTA chest.  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 And R knee surgery 06/2018. Sees [facility] Dr Elige Radon.    H/o "mini-CVA" around bell's palsy, on aspirin and crestor.  Previously treated with Tegretol for h/o L sided bell's palsy.   Zepbound started 05/2022. Titrated up to 10mg  dose, unable to afford - stopped 05/2023.   Recent labs revealed marked hyperinsulinemia to >500s. Glu at that time 212.  Denies symptoms of hypoglycemia.  No significant alcohol use.  No significant headaches.  Notes increased polydipsia, polyphagia, polyuria.  She's been checking sugars at home - 117-130 fasting.  Increased nocturia Increased appetite.   Ongoing joint pains - stopped Crestor 05/2023.   1 wk h/o L lateral hip pain worse with steps. Treating with muscle relaxant with benefit. Denies inciting trauma/injury. She had been walking more regularly until hip pain started.  Also notes R wrist pain. Points to ulnar wrist.    Preventative: Colon cancer screening - colonoscopy ~2015, rec rpt 5 yrs (in [place]) COLONOSCOPY WITH PROPOFOL 09/19/2020 - TA, int hem, rpt 7 yrs Servando Snare, Darren, MD)  Well woman exam - s/p hysterectomy 2001 precancerous cells, 1 ovary remains. Last well woman 10/2021 [facility]) - ok to stop pap  smears.  Mammogram 02/2022 [facility] @ [facility] - due for rpt DEXA 12/2015 - osteopenia (T -1.8 Lspine) DEXA 02/2022 - T -1.6 L femur neck, not at increased risk of fracture  Lung cancer screening - not eligible Flu shot yearly Prevnar-20 -agrees  COVID vaccine Pfizer 09/2019, 10/2019, booster 06/2020, 09/2021.  Tdap 07/2021 Shingrix - 07/2020, 07/2021  Advanced directive: discussed. Daughter Stacey Nichols is [employer], she lives in [place].  Seat belt use discussed  Sunscreen use discussed. No changing moles on skin.  Ex smoker ~2010, previously 1/4 ppd for ~20 yrs Alcohol - seldom  Dentist Q4mo  Eye exam - yearly  Bowel - intermittent constipation - manages with   From [place] Lives alone  Separated from husband after leukemia  Occ: retired, was Psychologist, sport and exercise  Edu: 2 yrs college Activity: no regular exercise  Diet: good ater, fruits/vegetables daily      Relevant past medical, surgical, family and social history reviewed and updated as indicated. Interim medical history since our last visit reviewed. Allergies and medications reviewed and updated. Outpatient Medications Prior to Visit  Medication Sig Dispense Refill   acetaminophen (TYLENOL) 500 MG tablet Take 500 mg by mouth every 6 (six) hours as needed.     albuterol (VENTOLIN HFA) 108 (90 Base) MCG/ACT inhaler Inhale 2 puffs into the lungs every 6 (six) hours as needed for wheezing or shortness of breath. Please schedule  office visit before any future refills. 18 each 0   aspirin 81 MG tablet Take 81 mg by mouth daily.     Biotin 5 MG CAPS Take 1 capsule (5 mg total) by mouth daily.     Multiple Vitamin (MULTIVITAMIN) tablet Take 1 tablet by mouth daily.     naproxen (NAPROSYN) 500 MG tablet Take 1 tablet (500 mg total) by mouth daily as needed for moderate pain. 90 tablet 1   pantoprazole (PROTONIX) 40 MG tablet Take 1 tablet (40 mg total) by mouth daily. 30 tablet 6   traZODone (DESYREL) 50 MG tablet TAKE 1/2 TO 1 TABLET BY MOUTH AT  BEDTIME AS NEEDED FOR SLEEP 90 tablet 3   b complex vitamins capsule Take 1 capsule by mouth every Monday, Wednesday, and Friday.     Cholecalciferol 1.25 MG (50000 UT) TABS Take 1 tablet by mouth once a week. 12 tablet 1   Ibuprofen (ADVIL PO) Take by mouth.     rosuvastatin (CRESTOR) 10 MG tablet TAKE 1 TABLET BY MOUTH EVERY DAY (Patient not taking: Reported on 10/01/2023) 90 tablet 0   tirzepatide (ZEPBOUND) 12.5 MG/0.5ML Pen Inject 12.5 mg into the skin once a week. 2 mL 6   No facility-administered medications prior to visit.     Per HPI unless specifically indicated in ROS section below Review of Systems  Constitutional:  Negative for activity change, appetite change, chills, fatigue, fever and unexpected weight change.  HENT:  Negative for hearing loss.   Eyes:  Negative for visual disturbance.  Respiratory:  Negative for cough, chest tightness, shortness of breath and wheezing.   Cardiovascular:  Negative for chest pain, palpitations and leg swelling.  Gastrointestinal:  Positive for diarrhea. Negative for abdominal distention, abdominal pain, blood in stool, constipation, nausea and vomiting.  Endocrine: Negative for cold intolerance and heat intolerance.  Genitourinary:  Negative for difficulty urinating and hematuria.  Musculoskeletal:  Positive for myalgias. Negative for arthralgias and neck pain.       Swelling to hands  Skin:  Negative for rash.  Neurological:  Positive for headaches. Negative for dizziness, seizures and syncope.  Hematological:  Negative for adenopathy. Does not bruise/bleed easily.  Psychiatric/Behavioral:  Negative for dysphoric mood. The patient is nervous/anxious.     Objective:  BP 120/74   Pulse 99   Temp 98.2 F (36.8 C) (Oral)   Ht 5' 2.5" (1.588 m)   Wt 236 lb (107 kg)   SpO2 94%   BMI 42.48 kg/m   Wt Readings from Last 3 Encounters:  10/01/23 236 lb (107 kg)  05/21/23 222 lb 8 oz (100.9 kg)  04/10/23 227 lb 12.8 oz (103.3 kg)       Physical Exam Vitals and nursing note reviewed.  Constitutional:      Appearance: Normal appearance. She is not ill-appearing.     Comments: Ambulating with cane due to L hip pain  HENT:     Head: Normocephalic and atraumatic.     Right Ear: Tympanic membrane, ear canal and external ear normal. There is no impacted cerumen.     Left Ear: Tympanic membrane, ear canal and external ear normal. There is no impacted cerumen.     Mouth/Throat:     Mouth: Mucous membranes are moist.     Pharynx: Oropharynx is clear. No oropharyngeal exudate or posterior oropharyngeal erythema.  Eyes:     General:        Right eye: No discharge.  Left eye: No discharge.     Extraocular Movements: Extraocular movements intact.     Conjunctiva/sclera: Conjunctivae normal.     Pupils: Pupils are equal, round, and reactive to light.  Neck:     Thyroid: No thyroid mass or thyromegaly.     Vascular: No carotid bruit.  Cardiovascular:     Rate and Rhythm: Normal rate and regular rhythm.     Pulses: Normal pulses.     Heart sounds: Normal heart sounds. No murmur heard. Pulmonary:     Effort: Pulmonary effort is normal. No respiratory distress.     Breath sounds: Normal breath sounds. No wheezing, rhonchi or rales.  Abdominal:     General: Bowel sounds are normal. There is no distension.     Palpations: Abdomen is soft. There is no mass.     Tenderness: There is no abdominal tenderness. There is no guarding or rebound.     Hernia: No hernia is present.  Musculoskeletal:     Cervical back: Normal range of motion and neck supple. No rigidity.     Right lower leg: No edema.     Left lower leg: No edema.     Comments:  No midline lumbar spine pain No significant lumbar paraspinous mm tenderness  Neg seated SLR bilaterally No significant pain with rotation of femoral head in hip joints bilaterally  R ulnar wrist pain reproducible to palpation just distal to ulna, exacerbated with ulnar deviation and  wrist flexion   Lymphadenopathy:     Cervical: No cervical adenopathy.  Skin:    General: Skin is warm and dry.     Findings: No rash.  Neurological:     General: No focal deficit present.     Mental Status: She is alert. Mental status is at baseline.  Psychiatric:        Mood and Affect: Mood normal.        Behavior: Behavior normal.       Results for orders placed or performed in visit on 09/24/23  Lactate Dehydrogenase   Collection Time: 09/24/23  8:08 AM  Result Value Ref Range   LDH 155 120 - 250 U/L  Fructosamine   Collection Time: 09/24/23  8:08 AM  Result Value Ref Range   Fructosamine 237 205 - 285 umol/L  Insulin, random   Collection Time: 09/24/23  8:08 AM  Result Value Ref Range   Insulin 541.8 (H) uIU/mL  T3   Collection Time: 09/24/23  8:08 AM  Result Value Ref Range   T3, Total 109 76 - 181 ng/dL  T4, free   Collection Time: 09/24/23  8:08 AM  Result Value Ref Range   Free T4 0.78 0.60 - 1.60 ng/dL  Folate   Collection Time: 09/24/23  8:08 AM  Result Value Ref Range   Folate 21.7 >5.9 ng/mL  IBC panel   Collection Time: 09/24/23  8:08 AM  Result Value Ref Range   Iron 72 42 - 145 ug/dL   Transferrin 161.0 960.4 - 360.0 mg/dL   Saturation Ratios 54.0 (L) 20.0 - 50.0 %   TIBC 387.8 250.0 - 450.0 mcg/dL  Ferritin   Collection Time: 09/24/23  8:08 AM  Result Value Ref Range   Ferritin 112.2 10.0 - 291.0 ng/mL  Vitamin B12   Collection Time: 09/24/23  8:08 AM  Result Value Ref Range   Vitamin B-12 629 211 - 911 pg/mL  CBC with Differential/Platelet   Collection Time: 09/24/23  8:08 AM  Result Value  Ref Range   WBC 5.1 4.0 - 10.5 K/uL   RBC 4.34 3.87 - 5.11 Mil/uL   Hemoglobin 14.5 12.0 - 15.0 g/dL   HCT 62.3 76.2 - 83.1 %   MCV 100.5 (H) 78.0 - 100.0 fl   MCHC 33.2 30.0 - 36.0 g/dL   RDW 51.7 61.6 - 07.3 %   Platelets 173.0 150.0 - 400.0 K/uL   Neutrophils Relative % 37.9 (L) 43.0 - 77.0 %   Lymphocytes Relative 48.5 (H) 12.0 - 46.0 %    Monocytes Relative 9.7 3.0 - 12.0 %   Eosinophils Relative 3.5 0.0 - 5.0 %   Basophils Relative 0.4 0.0 - 3.0 %   Neutro Abs 1.9 1.4 - 7.7 K/uL   Lymphs Abs 2.5 0.7 - 4.0 K/uL   Monocytes Absolute 0.5 0.1 - 1.0 K/uL   Eosinophils Absolute 0.2 0.0 - 0.7 K/uL   Basophils Absolute 0.0 0.0 - 0.1 K/uL  Hemoglobin A1c   Collection Time: 09/24/23  8:08 AM  Result Value Ref Range   Hgb A1c MFr Bld 6.2 4.6 - 6.5 %  TSH   Collection Time: 09/24/23  8:08 AM  Result Value Ref Range   TSH 4.80 0.35 - 5.50 uIU/mL  Comprehensive metabolic panel   Collection Time: 09/24/23  8:08 AM  Result Value Ref Range   Sodium 141 135 - 145 mEq/L   Potassium 4.0 3.5 - 5.1 mEq/L   Chloride 104 96 - 112 mEq/L   CO2 27 19 - 32 mEq/L   Glucose, Bld 212 (H) 70 - 99 mg/dL   BUN 23 6 - 23 mg/dL   Creatinine, Ser 7.10 0.40 - 1.20 mg/dL   Total Bilirubin 0.3 0.2 - 1.2 mg/dL   Alkaline Phosphatase 105 39 - 117 U/L   AST 28 0 - 37 U/L   ALT 39 (H) 0 - 35 U/L   Total Protein 6.4 6.0 - 8.3 g/dL   Albumin 4.1 3.5 - 5.2 g/dL   GFR 62.69 >48.54 mL/min   Calcium 9.5 8.4 - 10.5 mg/dL  Vitamin B1   Collection Time: 09/24/23  8:08 AM  Result Value Ref Range   Vitamin B1 (Thiamine) 15 8 - 30 nmol/L  VITAMIN D 25 Hydroxy (Vit-D Deficiency, Fractures)   Collection Time: 09/24/23  8:08 AM  Result Value Ref Range   VITD 30.42 30.00 - 100.00 ng/mL  Lipid panel   Collection Time: 09/24/23  8:08 AM  Result Value Ref Range   Cholesterol 166 0 - 200 mg/dL   Triglycerides 627.0 (H) 0.0 - 149.0 mg/dL   HDL 35.00 (L) >93.81 mg/dL   VLDL 82.9 (H) 0.0 - 93.7 mg/dL   LDL Cholesterol 48 0 - 99 mg/dL   Total CHOL/HDL Ratio 4    NonHDL 127.49    Lab Results  Component Value Date   CKTOTAL 62 05/21/2023   Assessment & Plan:   Problem List Items Addressed This Visit     Encounter for general adult medical examination with abnormal findings - Primary (Chronic)   Preventative protocols reviewed and updated unless pt  declined. Discussed healthy diet and lifestyle.       History of laparoscopic adjustable gastric banding   Obesity, morbid, BMI 40.0-49.9 (HCC)   Starting weight 215 lbs Stopped Zepbound (unaffordable) - was up to 10mg  dose, tolerated well. Caution with use in h/o gastric lap band followed by sleeve gastrectomy.  May restart injectable GLP1/GIP with new indication to treat OSA.  OSA (obstructive sleep apnea)   New indication for Zepbound for OSA - may restart this and see if insurance would cover.      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  Ongoing struggle with weight.  Reviewed recent labs.       Dyslipidemia   Chronic, crestor held 05/2023 to see if myalgias/arthralgias improved - noted some improvemeing since holding however chest have recurred. H/o coronary calcium score of zero 2017.  Given high triglycerides, discussed restarting low dose crestor 10mg  twice weekly.  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       Prediabetes   Stopped Zepbound (unaffordable) - was up to 10mg  dose, tolerated well.  Fructosamine (237) and A1c (6.2%) remain in prediabetes range, however random sugar level was >200, insulin levels markedly elevated >500 - see above. Overall remains in prediabetes range.  Will see if we are able to perform oral glucose challenge test to further evaluate for diabetes diagnosis for GLP1RA use.       Osteopenia   Reviewed latest DEXA - rec calcium in diet,  weight bearing exercise.       NAFLD (nonalcoholic fatty liver disease)   H/o this. ALT mildly elevated presumed due to this. Update LFTs, CPK, GGT next labs.  No alcohol use.       Relevant Orders   Hepatic function panel   Gamma GT   Borderline hypothyroidism   TSH 4's, fT4 normal. Continue to monitor.       Recurrent respiratory infection   Never completed PFTs.  No recent respiratory infection. She is now on daily PPI. Encouraged pulm f/u as she's overdue for this.       History of stroke   ?ministroke during Bell's Palsy episode      Osteoarthritis   Manages with naprosyn  500mg  daily PRN or tylenol/ibuprofen      Polyarthralgia   Improved off statin, but has recurred.  Will restart crestor twice weekly.  Update CPK      Relevant Orders   CK   Insulin resistance syndrome   Metabolic syndrome (insulin resistance) as evidenced by elevated insulin, central obesity, hypertriglyceridemia and low HDL. Discussed with patient and how this increases cardiovascular risk.  Reassuringly had normal coronary calcium score of zero (2017).  No h/o PCOS.  Markedly high insulin levels on latest check - see below.        Relevant Orders   Insulin, random   ANA   Insulin antibodies, blood   Hyperinsulinemia   Latest level markedly high >500  Will recheck next week labs along with insulin antibodies and ANA, and if persistently high low threshold to refer to endocrinology.       Relevant Orders   Insulin, random   ANA   Insulin antibodies, blood   Acute hip pain, left   Started 1 week ago, without inciting trauma/injury but notes significant walking regimen prior to starting to  hurt.  Overall reassuring physical exam today. Not quite consistent with greater trochanteric bursitis. Provided with exercises for this.  Update with effect - discussed low threshold to order imaging if not improving over the next week.       Right wrist pain   Localized to ulnar wrist ?TFCC  ligament sprain. Supportive measures reviewed, handout provided on gentle stretching, rec ice, voltaren topically. Update if not improving with treatment.       Gastroesophageal reflux disease without esophagitis   She continues omeprazole 40mg  daily which has helped cough.  Refilled today        Meds ordered this encounter  Medications   rosuvastatin (CRESTOR) 10 MG tablet    Sig: Take 1 tablet (10 mg total) by mouth 2 (two) times a week.    Dispense:  24 tablet    Refill:  3   Cholecalciferol 1.25 MG (50000 UT) TABS    Sig: Take 1 tablet by mouth once a week.    Dispense:  12 tablet    Refill:  4   b complex vitamins capsule    Sig: Take 1 capsule by mouth once a week.    Orders Placed This Encounter  Procedures   Insulin, random    Standing Status:   Future    Expiration Date:   10/01/2024   ANA    Standing Status:   Future    Expiration Date:   10/01/2024   Insulin antibodies, blood    Standing Status:   Future    Expiration Date:   10/01/2024   Hepatic function panel    Standing Status:   Future    Expiration Date:   10/01/2024   CK    Standing Status:   Future    Expiration Date:   10/01/2024   Gamma GT    Standing Status:   Future    Expiration Date:   10/01/2024    Patient Instructions  Call to schedule mammogram at your convenience: [facility] at [facility] [phone] Llamar a Dr [name] para hacer cita de seguimiento [phone] Trate ejercicios para dolor de cadera y [place]. Regresar la semana entrante para mas laboratorios.   Follow up plan: Return in about 6 months (around 04/02/2024) for follow up visit.  Eustaquio Boyden, MD

## 2023-10-02 DIAGNOSIS — M25552 Pain in left hip: Secondary | ICD-10-CM | POA: Insufficient documentation

## 2023-10-02 DIAGNOSIS — M25531 Pain in right wrist: Secondary | ICD-10-CM | POA: Insufficient documentation

## 2023-10-02 DIAGNOSIS — E161 Other hypoglycemia: Secondary | ICD-10-CM | POA: Insufficient documentation

## 2023-10-02 DIAGNOSIS — E8881 Metabolic syndrome: Secondary | ICD-10-CM | POA: Insufficient documentation

## 2023-10-02 DIAGNOSIS — K219 Gastro-esophageal reflux disease without esophagitis: Secondary | ICD-10-CM | POA: Insufficient documentation

## 2023-10-02 NOTE — Assessment & Plan Note (Signed)
 Localized to ulnar wrist ?TFCC ligament sprain. Supportive measures reviewed, handout provided on gentle stretching, rec ice, voltaren topically. Update if not improving with treatment.

## 2023-10-02 NOTE — Assessment & Plan Note (Signed)
 Reviewed latest DEXA - rec calcium in diet, weight bearing exercise.

## 2023-10-02 NOTE — Telephone Encounter (Signed)
 Spoke with pt. She's scheduled for labs on 10/08/23 at 8:45. I scheduled NV same day at 9:00.   [Signed vaccine sheet in basket on Lisa's desk.]

## 2023-10-02 NOTE — Assessment & Plan Note (Signed)
 Chronic, crestor held 05/2023 to see if myalgias/arthralgias improved - noted some improvemeing since holding however chest have recurred. H/o coronary calcium score of zero 2017.  Given high triglycerides, discussed restarting low dose crestor 10mg  twice weekly.  The ASCVD Risk score (Arnett DK, et al., 2019) failed to calculate for the following reasons:   Risk score cannot be calculated because patient has a medical history suggesting prior/existing ASCVD

## 2023-10-02 NOTE — Assessment & Plan Note (Signed)
 Started 1 week ago, without inciting trauma/injury but notes significant walking regimen prior to starting to hurt.  Overall reassuring physical exam today. Not quite consistent with greater trochanteric bursitis. Provided with exercises for this.  Update with effect - discussed low threshold to order imaging if not improving over the next week.

## 2023-10-02 NOTE — Assessment & Plan Note (Signed)
 Improved off statin, but has recurred.  Will restart crestor twice weekly.  Update CPK

## 2023-10-02 NOTE — Assessment & Plan Note (Addendum)
 Starting weight 215 lbs Stopped Zepbound (unaffordable) - was up to 10mg  dose, tolerated well. Caution with use in h/o gastric lap band followed by sleeve gastrectomy.  May restart injectable GLP1/GIP with new indication to treat OSA.

## 2023-10-02 NOTE — Assessment & Plan Note (Addendum)
 Stopped Zepbound (unaffordable) - was up to 10mg  dose, tolerated well.  Fructosamine (237) and A1c (6.2%) remain in prediabetes range, however random sugar level was >200, insulin levels markedly elevated >500 - see above. Overall remains in prediabetes range.  Will see if we are able to perform oral glucose challenge test to further evaluate for diabetes diagnosis for GLP1RA use.

## 2023-10-02 NOTE — Assessment & Plan Note (Addendum)
 Never completed PFTs.  No recent respiratory infection. She is now on daily PPI. Encouraged pulm f/u as she's overdue for this.

## 2023-10-02 NOTE — Assessment & Plan Note (Signed)
 TSH 4's, fT4 normal. Continue to monitor.

## 2023-10-02 NOTE — Assessment & Plan Note (Signed)
?  ministroke during Bell's Palsy episode

## 2023-10-02 NOTE — Assessment & Plan Note (Signed)
 Metabolic syndrome (insulin resistance) as evidenced by elevated insulin, central obesity, hypertriglyceridemia and low HDL. Discussed with patient and how this increases cardiovascular risk.  Reassuringly had normal coronary calcium score of zero (2017).  No h/o PCOS.  Markedly high insulin levels on latest check - see below.

## 2023-10-02 NOTE — Assessment & Plan Note (Signed)
 Manages with naprosyn  500mg  daily PRN or tylenol/ibuprofen

## 2023-10-02 NOTE — Telephone Encounter (Signed)
 Lvm asking pt to call back. Need to schedule NV for Prevnar-20, per Dr Reece Agar.  [Signed vaccine sheet in basket on Lisa's desk.]

## 2023-10-02 NOTE — Assessment & Plan Note (Addendum)
 H/o this. ALT mildly elevated presumed due to this. Update LFTs, CPK, GGT next labs.  No alcohol use.

## 2023-10-02 NOTE — Assessment & Plan Note (Addendum)
 Latest level markedly high >500  Will recheck next week labs along with insulin antibodies and ANA, and if persistently high low threshold to refer to endocrinology.

## 2023-10-02 NOTE — Assessment & Plan Note (Signed)
 New indication for Zepbound for OSA - may restart this and see if insurance would cover.

## 2023-10-02 NOTE — Assessment & Plan Note (Signed)
 CBC, LDH urate remains normal.

## 2023-10-02 NOTE — Assessment & Plan Note (Signed)
 Continue weekly vit D3 - refilled today

## 2023-10-02 NOTE — Assessment & Plan Note (Addendum)
 She continues omeprazole 40mg  daily which has helped cough.  Refilled today

## 2023-10-02 NOTE — Assessment & Plan Note (Addendum)
 H/o swedish lap band in [place] 2000s, 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  Ongoing struggle with weight.  Reviewed recent labs.

## 2023-10-08 ENCOUNTER — Ambulatory Visit (INDEPENDENT_AMBULATORY_CARE_PROVIDER_SITE_OTHER)

## 2023-10-08 ENCOUNTER — Other Ambulatory Visit (INDEPENDENT_AMBULATORY_CARE_PROVIDER_SITE_OTHER)

## 2023-10-08 DIAGNOSIS — E785 Hyperlipidemia, unspecified: Secondary | ICD-10-CM

## 2023-10-08 DIAGNOSIS — E161 Other hypoglycemia: Secondary | ICD-10-CM

## 2023-10-08 DIAGNOSIS — K760 Fatty (change of) liver, not elsewhere classified: Secondary | ICD-10-CM

## 2023-10-08 DIAGNOSIS — M2550 Pain in unspecified joint: Secondary | ICD-10-CM | POA: Diagnosis not present

## 2023-10-08 DIAGNOSIS — Z23 Encounter for immunization: Secondary | ICD-10-CM | POA: Diagnosis not present

## 2023-10-08 DIAGNOSIS — E8881 Metabolic syndrome: Secondary | ICD-10-CM

## 2023-10-08 LAB — GAMMA GT: GGT: 14 U/L (ref 7–51)

## 2023-10-08 LAB — CK: Total CK: 46 U/L (ref 7–177)

## 2023-10-08 LAB — HEPATIC FUNCTION PANEL
ALT: 28 U/L (ref 0–35)
AST: 22 U/L (ref 0–37)
Albumin: 4 g/dL (ref 3.5–5.2)
Alkaline Phosphatase: 75 U/L (ref 39–117)
Bilirubin, Direct: 0.1 mg/dL (ref 0.0–0.3)
Total Bilirubin: 0.4 mg/dL (ref 0.2–1.2)
Total Protein: 6.4 g/dL (ref 6.0–8.3)

## 2023-10-08 NOTE — Progress Notes (Signed)
 Per orders of Dr. Eustaquio Boyden, injection of prevnar 20 given by Lewanda Rife in right deltoid. Patient tolerated injection well.

## 2023-10-09 ENCOUNTER — Other Ambulatory Visit: Payer: Self-pay | Admitting: Family Medicine

## 2023-10-09 DIAGNOSIS — Z1231 Encounter for screening mammogram for malignant neoplasm of breast: Secondary | ICD-10-CM

## 2023-10-10 ENCOUNTER — Ambulatory Visit
Admission: RE | Admit: 2023-10-10 | Discharge: 2023-10-10 | Disposition: A | Discharge status: Home / Self Care | Source: Ambulatory Visit | Attending: Family Medicine | Admitting: Family Medicine

## 2023-10-10 ENCOUNTER — Encounter: Payer: Self-pay | Admitting: Family Medicine

## 2023-10-10 DIAGNOSIS — C9201 Acute myeloblastic leukemia, in remission: Secondary | ICD-10-CM | POA: Insufficient documentation

## 2023-10-10 DIAGNOSIS — K760 Fatty (change of) liver, not elsewhere classified: Secondary | ICD-10-CM | POA: Diagnosis not present

## 2023-10-11 ENCOUNTER — Encounter: Payer: Self-pay | Admitting: Family Medicine

## 2023-10-14 LAB — ANTI-NUCLEAR AB-TITER (ANA TITER): ANA Titer 1: 1:80 {titer} — ABNORMAL HIGH

## 2023-10-14 LAB — INSULIN ANTIBODIES, BLOOD: Insulin Antibodies, Human: <0.4 U/mL (ref <0.4)

## 2023-10-14 LAB — INSULIN, RANDOM: Insulin: 52.9 u[IU]/mL — ABNORMAL HIGH

## 2023-10-14 LAB — ANA: Anti Nuclear Antibody (ANA): POSITIVE — AB

## 2023-10-20 ENCOUNTER — Encounter: Payer: Self-pay | Admitting: Family Medicine

## 2023-10-20 NOTE — Telephone Encounter (Signed)
Replied via result note.

## 2023-10-21 ENCOUNTER — Ambulatory Visit
Admission: RE | Admit: 2023-10-21 | Discharge: 2023-10-21 | Disposition: A | Discharge status: Home / Self Care | Source: Ambulatory Visit | Attending: Family Medicine | Admitting: Family Medicine

## 2023-10-21 DIAGNOSIS — Z1231 Encounter for screening mammogram for malignant neoplasm of breast: Secondary | ICD-10-CM | POA: Diagnosis present

## 2023-10-24 ENCOUNTER — Ambulatory Visit (INDEPENDENT_AMBULATORY_CARE_PROVIDER_SITE_OTHER): Admitting: Family Medicine

## 2023-10-24 ENCOUNTER — Encounter: Payer: Self-pay | Admitting: Family Medicine

## 2023-10-24 ENCOUNTER — Ambulatory Visit (INDEPENDENT_AMBULATORY_CARE_PROVIDER_SITE_OTHER)
Admission: RE | Admit: 2023-10-24 | Discharge: 2023-10-24 | Disposition: A | Discharge status: Home / Self Care | Source: Ambulatory Visit | Attending: Family Medicine | Admitting: Family Medicine

## 2023-10-24 VITALS — BP 126/76 | HR 74 | Temp 98.3°F | Ht 62.5 in | Wt 237.5 lb

## 2023-10-24 DIAGNOSIS — R7303 Prediabetes: Secondary | ICD-10-CM

## 2023-10-24 DIAGNOSIS — R937 Abnormal findings on diagnostic imaging of other parts of musculoskeletal system: Secondary | ICD-10-CM | POA: Diagnosis not present

## 2023-10-24 DIAGNOSIS — M5442 Lumbago with sciatica, left side: Secondary | ICD-10-CM | POA: Diagnosis not present

## 2023-10-24 DIAGNOSIS — R52 Pain, unspecified: Secondary | ICD-10-CM

## 2023-10-24 DIAGNOSIS — R8290 Unspecified abnormal findings in urine: Secondary | ICD-10-CM | POA: Diagnosis not present

## 2023-10-24 DIAGNOSIS — M545 Low back pain, unspecified: Secondary | ICD-10-CM

## 2023-10-24 DIAGNOSIS — E8881 Metabolic syndrome: Secondary | ICD-10-CM

## 2023-10-24 LAB — POC URINALSYSI DIPSTICK (AUTOMATED)
Bilirubin, UA: NEGATIVE
Blood, UA: NEGATIVE
Glucose, UA: NEGATIVE
Ketones, UA: NEGATIVE
Nitrite, UA: NEGATIVE
Protein, UA: NEGATIVE
Spec Grav, UA: 1.025 (ref 1.010–1.025)
Urobilinogen, UA: 0.2 U/dL
pH, UA: 6 (ref 5.0–8.0)

## 2023-10-24 MED ORDER — METFORMIN HCL 500 MG PO TABS: 500.0000 mg | ORAL_TABLET | Freq: Every day | ORAL | 3 refills | Status: DC

## 2023-10-24 MED ORDER — METHOCARBAMOL 500 MG PO TABS
500.0000 mg | ORAL_TABLET | Freq: Two times a day (BID) | ORAL | 1 refills | Status: DC | PRN
Start: 2023-10-24 — End: 2023-12-15

## 2023-10-24 NOTE — Patient Instructions (Addendum)
 Haga examen de azucar en laboratorio del [facility] - no tiene que tener cita pero vaya temprano por la maana en ayunas Despues de hacer este examen, comienze metformina 500mg  con desayuno.  He mandado cultura de [place] y Radiographer, therapeutic en contacto con los [place].  Rayos x de espalda y articulacion sacroiliaca hoy.  Siga naprosyn 500mg  1-2 veces al dia con comida. Puede seguir methocarbamol 500mg  1-2 tabletas dos veces al dia - he rellenado [employer].

## 2023-10-24 NOTE — Progress Notes (Addendum)
 Ph: [phone] Fax: [phone]   Patient ID: Stacey Nichols, female    DOB: Jan 29, 1961, 63 y.o.   MRN: [id number]  This visit was conducted in person.  BP 126/76   Pulse 74   Temp 98.3 F (36.8 C) (Oral)   Ht 5' 2.5" (1.588 m)   Wt 237 lb 8 oz (107.7 kg)   SpO2 94%   BMI 42.75 kg/m    CC: back pain Subjective:   HPI: Stacey Nichols is a 63 y.o. female presenting on 10/24/2023 for Back Pain (C/o off and on mid low back pain. Started about 6 wks ago. Denies injury. Also, wants to discuss recent lab results. )   6 wk h/o mid to lower back pain without noted injury. Worse with prolonged walking. Pain started when she woke up with bad cramp to left calf. Describes midline lower lumbar spine pain with radiation to both buttocks as well as pain to L sacroiliac joint and sciatic notch, with radiation down posterior thigh to mid calf. This is the longest pain has lasted. She notes constant pressure to left lower back.   Notes increased nocturia urgency and daytime frequency without dysuria hematuria or urgency.   No leg numbness, weakness, no bowel/bladder incontinence, no fevers/chills, no saddle anesthesia.  She notes significant limitations due to diffuse body pains.  No symptoms of active synovitis.  No fevers/chills, skin rash, redness of eyes.  Skin stays itchy.  She has been taking methocarbamol, naprosyn without benefit.   Holding Crestor didn't improve these symptoms, CPK levels normal range, restarting Crestor twice weekly didn't worsen symptoms  She notes stretching of sciatic nerve is beneficial.   Recent labs were reviewed:  CPK, ESR/CRP normal, vit D, B1, B12 and folate normal, ANA mildly positive 1:80 homogeneous nuclear, RF, LDH, TFTs normal (05/2023, 09/2023)     Relevant past medical, surgical, family and social history reviewed and updated as indicated. Interim medical history since our last visit reviewed. Allergies and medications reviewed and  updated. Outpatient Medications Prior to Visit  Medication Sig Dispense Refill   acetaminophen (TYLENOL) 500 MG tablet Take 500 mg by mouth every 6 (six) hours as needed.     albuterol (VENTOLIN HFA) 108 (90 Base) MCG/ACT inhaler Inhale 2 puffs into the lungs every 6 (six) hours as needed for wheezing or shortness of breath. Please schedule office visit before any future refills. 18 each 0   aspirin 81 MG tablet Take 81 mg by mouth daily.     b complex vitamins capsule Take 1 capsule by mouth once a week.     Biotin 5 MG CAPS Take 1 capsule (5 mg total) by mouth daily.     Cholecalciferol 1.25 MG (50000 UT) TABS Take 1 tablet by mouth once a week. 12 tablet 4   Multiple Vitamin (MULTIVITAMIN) tablet Take 1 tablet by mouth daily.     naproxen (NAPROSYN) 500 MG tablet Take 1 tablet (500 mg total) by mouth daily as needed for moderate pain. 90 tablet 1   pantoprazole (PROTONIX) 40 MG tablet Take 1 tablet (40 mg total) by mouth daily. 30 tablet 6   rosuvastatin (CRESTOR) 10 MG tablet Take 1 tablet (10 mg total) by mouth 2 (two) times a week. 24 tablet 3   traZODone (DESYREL) 50 MG tablet TAKE 1/2 TO 1 TABLET BY MOUTH AT BEDTIME AS NEEDED FOR SLEEP 90 tablet 3   methocarbamol (ROBAXIN) 500 MG tablet Take 500 mg by mouth daily.  No facility-administered medications prior to visit.     Per HPI unless specifically indicated in ROS section below Review of Systems  Objective:  BP 126/76   Pulse 74   Temp 98.3 F (36.8 C) (Oral)   Ht 5' 2.5" (1.588 m)   Wt 237 lb 8 oz (107.7 kg)   SpO2 94%   BMI 42.75 kg/m   Wt Readings from Last 3 Encounters:  10/24/23 237 lb 8 oz (107.7 kg)  10/01/23 236 lb (107 kg)  05/21/23 222 lb 8 oz (100.9 kg)      Physical Exam Vitals and nursing note reviewed.  Constitutional:      Appearance: Normal appearance. She is not ill-appearing.  HENT:     Mouth/Throat:     Mouth: Mucous membranes are moist.     Pharynx: Oropharynx is clear. No oropharyngeal  exudate or posterior oropharyngeal erythema.  Eyes:     Extraocular Movements: Extraocular movements intact.     Conjunctiva/sclera: Conjunctivae normal.     Pupils: Pupils are equal, round, and reactive to light.  Cardiovascular:     Rate and Rhythm: Normal rate and regular rhythm.     Pulses: Normal pulses.     Heart sounds: Normal heart sounds.  Pulmonary:     Effort: Pulmonary effort is normal. No respiratory distress.     Breath sounds: Normal breath sounds. No wheezing, rhonchi or rales.  Abdominal:     General: Bowel sounds are normal. There is no distension.     Palpations: Abdomen is soft. There is no mass.     Tenderness: There is no abdominal tenderness. There is no right CVA tenderness, left CVA tenderness, guarding or rebound.     Hernia: No hernia is present.  Musculoskeletal:        General: Tenderness present. Normal range of motion.     Comments:  Discomfort lower midline lumbar spine Mild paraspinous mm tenderness Neg SLR bilaterally. No pain with int/ext rotation at hip. + FABER on left. + pain at L SIJ, L GTB, L sciatic notch.   Skin:    General: Skin is warm and dry.     Findings: No bruising, erythema or rash.  Neurological:     General: No focal deficit present.     Mental Status: She is alert.     Sensory: Sensation is intact.     Motor: Motor function is intact.     Coordination: Coordination is intact.     Gait: Gait is intact.     Comments:  CN grossly intact 5/5 strength BLE       Results for orders placed or performed in visit on 10/24/23  POCT Urinalysis Dipstick (Automated)   Collection Time: 10/24/23 12:19 PM  Result Value Ref Range   Color, UA yellow    Clarity, UA clear    Glucose, UA Negative Negative   Bilirubin, UA negative    Ketones, UA negative    Spec Grav, UA 1.025 1.010 - 1.025   Blood, UA negative    pH, UA 6.0 5.0 - 8.0   Protein, UA Negative Negative   Urobilinogen, UA 0.2 0.2 or 1.0 E.U./dL   Nitrite, UA negative     Leukocytes, UA Moderate (2+) (A) Negative   Lab Results  Component Value Date   ESRSEDRATE 14 05/21/2023   DG Lumbar Spine Complete CLINICAL DATA:  left low back pain with radiculopathy x 6 + wks  EXAM: LUMBAR SPINE - COMPLETE 4+ VIEW  COMPARISON:  X-ray lumbar spine 07/12/2015  FINDINGS: There is no evidence of lumbar spine fracture. Interval worsening of multilevel moderate degenerative changes of the spine. Interval development of dextroscoliosis centered at the L3 level with associated 1.4 cm left to right translocation of the L4 vertebral body relation to the L5 level. Suggestion of likely osseous neural foraminal stenosis. Multilevel moderate intervertebral disc space narrowing, most prominent at the L5-S1 level.  IMPRESSION: Interval development of dextroscoliosis centered at the L3 level with associated 1.4 cm left to right translocation of the L4 vertebral body relation to the L5 level. Consider cross-sectional imaging for further evaluation.  Electronically Signed   By: Morgane  Naveau M.D.   On: 10/25/2023 00:19  Assessment & Plan:   Problem List Items Addressed This Visit     Obesity, morbid, BMI 40.0-49.9 (HCC)   Relevant Medications   metFORMIN (GLUCOPHAGE) 500 MG tablet   Low back pain   6+ weeks of left lower back and leg pain without trauma/injury or red flags.  Previously consistent with bursitis - did not improve with treatment recommendations (NSAID, bursitis home exercise program, methocarbamol muscle relaxant).  Today's symptoms are more consistent with left sciatica and possible L sacroiliitis.  Not consistent with hip joint pain etiology.  Given ongoing symptoms, update lumbar and SIJ films - see below re: abnormal findings.  Discussed naprosyn and methocarbamol use.       Relevant Medications   methocarbamol (ROBAXIN) 500 MG tablet   Other Relevant Orders   DG Si Joints (Completed)   DG Lumbar Spine Complete (Completed)   Urine Culture    Ambulatory referral to Neurosurgery   MR Lumbar Spine Wo Contrast   POCT Urinalysis Dipstick (Automated) (Completed)   Prediabetes   Insulin resistance syndrome   Previously discussed consistent with metabolic syndrome - elevated insulin, central obesity, hypertriglyceridemia, low HDL. Did have calcium score of zero (2017). No h/o PCOS.  Insulin levels improved from 500s but remain persistently elevated in 50s.  Will order oral glucose tolerance test to be done at [facility] lab when she returns to town (eval for T2DM), then recommend start metformin 500mg  daily with breakfast - she's previously tolerated well.  A1c, fructosamine, cbg's remain in prediabetes range      Abnormal x-ray of lumbar spine - Primary   Xrays showing progression of dextro-scoliosis since 2016, with new significant lateral deviation of L4 on L5 vertebra.  Will request neurosurgical evaluation.  Will try to expedite lumbar MRI as well.       Relevant Orders   Ambulatory referral to Neurosurgery   MR Lumbar Spine Wo Contrast   Abnormal urinalysis   UA checked for low back pain - she notes increased urgency, frequency and nocturia.  UA with significant white cells, but no significant blood or bacteria - will send urine culture      Generalized body aches   CPK normal. ESR normal.  Starting/ stopping Crestor didn't have effect.  ?fibromyalgia - endorses h/o this. Cymbalta previously didn't help.         Meds ordered this encounter  Medications   metFORMIN (GLUCOPHAGE) 500 MG tablet    Sig: Take 1 tablet (500 mg total) by mouth daily with breakfast.    Dispense:  90 tablet    Refill:  3   methocarbamol (ROBAXIN) 500 MG tablet    Sig: Take 1-2 tablets (500-1,000 mg total) by mouth 2 (two) times daily as needed for muscle spasms.    Dispense:  60 tablet  Refill:  1    Orders Placed This Encounter  Procedures   Urine Culture   DG Si Joints    Standing Status:   Future    Number of Occurrences:   1     Expiration Date:   10/23/2024    Reason for Exam (SYMPTOM  OR DIAGNOSIS REQUIRED):   L SIJ pain + FABER test    Preferred imaging location?:   [facility] Lumbar Spine Complete    Standing Status:   Future    Number of Occurrences:   1    Expiration Date:   10/23/2024    Reason for Exam (SYMPTOM  OR DIAGNOSIS REQUIRED):   left low back pain with radiculopathy x 6 + wks    Preferred imaging location?:    [place]   MR Lumbar Spine Wo Contrast    Standing Status:   Future    Expiration Date:   10/24/2024    What is the patient's sedation requirement?:   No Sedation    Does the patient have a pacemaker or implanted devices?:   No    Preferred imaging location?:   [facility] (table limit-350lbs)   Ambulatory referral to Neurosurgery    Referral Priority:   Urgent    Referral Type:   Surgical    Referral Reason:   Specialty Services Required    Requested Specialty:   Neurosurgery    Number of Visits Requested:   1   POCT Urinalysis Dipstick (Automated)    Patient Instructions  Haga examen de azucar en laboratorio del [facility] - no tiene que tener cita pero vaya temprano por la maana en ayunas Despues de hacer este examen, comienze metformina 500mg  con desayuno.  He mandado cultura de [place] y Radiographer, therapeutic en contacto con los [place].  Rayos x de espalda y articulacion sacroiliaca hoy.  Siga naprosyn 500mg  1-2 veces al dia con comida. Puede seguir methocarbamol 500mg  1-2 tabletas dos veces al dia - he rellenado este medicamento.   Follow up plan: No follow-ups on file.  Claire Crick, MD

## 2023-10-25 ENCOUNTER — Encounter: Payer: Self-pay | Admitting: Family Medicine

## 2023-10-25 DIAGNOSIS — R937 Abnormal findings on diagnostic imaging of other parts of musculoskeletal system: Secondary | ICD-10-CM | POA: Insufficient documentation

## 2023-10-25 DIAGNOSIS — R8290 Unspecified abnormal findings in urine: Secondary | ICD-10-CM | POA: Insufficient documentation

## 2023-10-25 DIAGNOSIS — R52 Pain, unspecified: Secondary | ICD-10-CM | POA: Insufficient documentation

## 2023-10-25 LAB — URINE CULTURE
MICRO NUMBER:: 16319549
SPECIMEN QUALITY:: ADEQUATE

## 2023-10-25 NOTE — Assessment & Plan Note (Addendum)
 Previously discussed consistent with metabolic syndrome - elevated insulin, central obesity, hypertriglyceridemia, low HDL. Did have calcium score of zero (2017). No h/o PCOS.  Insulin levels improved from 500s but remain persistently elevated in 50s.  Will order oral glucose tolerance test to be done at [facility] lab when she returns to town (eval for T2DM), then recommend start metformin 500mg  daily with breakfast - she's previously tolerated well.  A1c, fructosamine, cbg's remain in prediabetes range

## 2023-10-25 NOTE — Assessment & Plan Note (Addendum)
 6+ weeks of left lower back and leg pain without trauma/injury or red flags.  Previously consistent with bursitis - did not improve with treatment recommendations (NSAID, bursitis home exercise program, methocarbamol muscle relaxant).  Today's symptoms are more consistent with left sciatica and possible L sacroiliitis.  Not consistent with hip joint pain etiology.  Given ongoing symptoms, update lumbar and SIJ films - see below re: abnormal findings.  Discussed naprosyn and methocarbamol use.

## 2023-10-25 NOTE — Assessment & Plan Note (Signed)
 UA checked for low back pain - she notes increased urgency, frequency and nocturia.  UA with significant white cells, but no significant blood or bacteria - will send urine culture

## 2023-10-25 NOTE — Assessment & Plan Note (Signed)
 Xrays showing progression of dextro-scoliosis since 2016, with new significant lateral deviation of L4 on L5 vertebra.  Will request neurosurgical evaluation.  Will try to expedite lumbar MRI as well.

## 2023-10-25 NOTE — Assessment & Plan Note (Addendum)
 CPK normal. ESR normal.  Starting/ stopping Crestor didn't have effect.  ?fibromyalgia - endorses h/o this. Cymbalta previously didn't help.

## 2023-10-27 ENCOUNTER — Encounter: Payer: Self-pay | Admitting: Family Medicine

## 2023-10-30 ENCOUNTER — Encounter: Payer: Self-pay | Admitting: *Deleted

## 2023-11-11 ENCOUNTER — Other Ambulatory Visit
Admission: RE | Admit: 2023-11-11 | Discharge: 2023-11-11 | Disposition: A | Discharge status: Home / Self Care | Attending: Family Medicine | Admitting: Family Medicine

## 2023-11-11 DIAGNOSIS — R7303 Prediabetes: Secondary | ICD-10-CM | POA: Diagnosis present

## 2023-11-11 DIAGNOSIS — E88819 Insulin resistance, unspecified: Secondary | ICD-10-CM | POA: Insufficient documentation

## 2023-11-11 DIAGNOSIS — E8881 Metabolic syndrome: Secondary | ICD-10-CM | POA: Insufficient documentation

## 2023-11-11 LAB — GLUCOSE, 2 HOUR GESTATIONAL: Glucose Tolerance, 2 hour: 191 mg/dL

## 2023-11-11 LAB — GLUCOSE, 1 HOUR GESTATIONAL: Glucose Tolerance, 1 hour: 236 mg/dL

## 2023-11-11 LAB — GLUCOSE, 3 HOUR GESTATIONAL: Glucose Tolerance, 3 Hour: 86 mg/dL

## 2023-11-11 LAB — GLUCOSE, FASTING GESTATIONAL: Glucose Tolerance, Fasting: 125 mg/dL

## 2023-11-12 ENCOUNTER — Ambulatory Visit: Payer: Self-pay | Admitting: *Deleted

## 2023-11-12 NOTE — Telephone Encounter (Signed)
 Called patient to review medication request via interpreter ID # [id number]. No answer, LVMTCB [phone].     Copied from CRM [id number]. Topic: Clinical - Prescription Issue >> Nov 12, 2023 10:47 AM Alyse July wrote: Reason for CRM: Patient would like to know if she can be prescribed an anxiety medication prior to her procedure which is scheduled for 11/19/23 at 9:00 am. [id number] [place], [place] - [address] is the preferred pharmacy.

## 2023-11-12 NOTE — Telephone Encounter (Signed)
 Copied from CRM [id number]. Topic: Clinical - Prescription Issue >> Nov 12, 2023 10:47 AM Alyse July wrote: Reason for CRM: Patient would like to know if she can be prescribed an anxiety medication prior to her procedure which is scheduled for 11/19/23 at 9:00 am. [id number] [place], [place] - [address] is the preferred pharmacy.   Patient called to request something for her anxiety for her MRI next week on 11/19/23. She denies any current symptoms. Please contact patient with a response to this request when able.    Reason for Disposition  Prescription request for new medicine (not a refill)  Answer Assessment - Initial Assessment Questions 1. NAME of MEDICINE: "What medicine(s) are you calling about?"     Would like something for anxiety  2. QUESTION: "What is your question?" (e.g., double dose of medicine, side effect)     Patient would like something for anxiety for her MRI next week on 11/19/23  Protocols used: Medication Question Call-A-AH

## 2023-11-13 ENCOUNTER — Encounter: Payer: Self-pay | Admitting: Family Medicine

## 2023-11-13 ENCOUNTER — Telehealth: Payer: Self-pay | Admitting: Family Medicine

## 2023-11-13 ENCOUNTER — Other Ambulatory Visit: Payer: Self-pay | Admitting: Family Medicine

## 2023-11-13 MED ORDER — DIAZEPAM 5 MG PO TABS: 5.0000 mg | ORAL_TABLET | Freq: Two times a day (BID) | ORAL | 0 refills | Status: DC | PRN

## 2023-11-13 MED ORDER — DIAZEPAM 5 MG PO TABS
5.0000 mg | ORAL_TABLET | Freq: Two times a day (BID) | ORAL | 0 refills | Status: DC | PRN
Start: 2023-11-13 — End: 2023-11-13

## 2023-11-13 NOTE — Telephone Encounter (Signed)
 Copied from CRM [id number]. Topic: General - Other >> Nov 13, 2023  3:46 PM Stacey Nichols wrote: Reason for CRM: pt called to return Joellen's call. Please call back and advise.

## 2023-11-13 NOTE — Addendum Note (Signed)
 Addended by: Claire Crick on: 11/13/2023 12:52 PM   Modules accepted: Orders

## 2023-11-13 NOTE — Telephone Encounter (Signed)
 ERx valium  5mg  PRN MRI.  Rec she have someone drive her to appt if she takes this.  I believe we are still in process of getting this approved by her insurance.

## 2023-11-13 NOTE — Telephone Encounter (Signed)
 Left message to return call to our office.

## 2023-11-14 ENCOUNTER — Telehealth: Payer: Self-pay

## 2023-11-14 NOTE — Telephone Encounter (Signed)
 Pt rtn call (see 11/13/23 phn  note).   Spoke with pt relaying Dr Ocie Belt message. Pt verbalizes understanding and expresses her thanks.

## 2023-11-14 NOTE — Telephone Encounter (Signed)
 Authorization #[id number] Valid until 11/29/2023

## 2023-11-14 NOTE — Telephone Encounter (Signed)
 Spoke with pt (see 11/12/23 phn note).

## 2023-11-14 NOTE — Telephone Encounter (Signed)
 Copied from CRM [id number]. Topic: General - Other >> Nov 14, 2023  8:35 AM Howard Macho wrote: Reason for CRM: melissa from [facility] pre service called stating the patient has MRI scheduled on 5/7, but the authorization hs been denied. Patient has two insurances and the primary insurance denied the MRI CB [phone] ext [phone]

## 2023-11-14 NOTE — Telephone Encounter (Signed)
 I have sent Referral Messages to Dr Mariam Shingles regarding this.   The MRI has been denied   He has the option to call and try to get this approved.   All information is in the Referral order and messages.

## 2023-11-14 NOTE — Telephone Encounter (Signed)
 Thank you :)

## 2023-11-17 NOTE — Telephone Encounter (Signed)
 Copied from CRM [id number]. Topic: General - Other >> Nov 17, 2023 12:51 PM [name] wrote: [name] from [facility] Pre-[facility] center called back stating that the primary insurance is good to go, but patient's secondary insurance ([employer]) also needs prior authorization before patient's appointment on May 7th. Callback number for Stacey Nichols is [phone] Ext. [phone].

## 2023-11-17 NOTE — Telephone Encounter (Signed)
 Plz call imaging center to notify of approval.

## 2023-11-17 NOTE — Telephone Encounter (Signed)
 See referral order notes [place] PENDING Approval - Clinicals uploaded to [employer] Rcvd: 11/17/2023 - Clinical information received via fax or upload

## 2023-11-19 ENCOUNTER — Ambulatory Visit

## 2023-11-19 ENCOUNTER — Ambulatory Visit
Admission: RE | Admit: 2023-11-19 | Discharge: 2023-11-19 | Disposition: A | Discharge status: Home / Self Care | Source: Ambulatory Visit | Attending: Family Medicine | Admitting: Family Medicine

## 2023-11-19 DIAGNOSIS — R937 Abnormal findings on diagnostic imaging of other parts of musculoskeletal system: Secondary | ICD-10-CM | POA: Diagnosis present

## 2023-11-19 DIAGNOSIS — M5117 Intervertebral disc disorders with radiculopathy, lumbosacral region: Secondary | ICD-10-CM | POA: Diagnosis not present

## 2023-11-19 DIAGNOSIS — M4186 Other forms of scoliosis, lumbar region: Secondary | ICD-10-CM | POA: Diagnosis not present

## 2023-11-19 DIAGNOSIS — M5442 Lumbago with sciatica, left side: Secondary | ICD-10-CM | POA: Insufficient documentation

## 2023-11-19 DIAGNOSIS — M5115 Intervertebral disc disorders with radiculopathy, thoracolumbar region: Secondary | ICD-10-CM | POA: Diagnosis not present

## 2023-11-19 DIAGNOSIS — M5116 Intervertebral disc disorders with radiculopathy, lumbar region: Secondary | ICD-10-CM | POA: Diagnosis not present

## 2023-12-09 ENCOUNTER — Ambulatory Visit: Payer: Self-pay | Admitting: Family Medicine

## 2023-12-12 ENCOUNTER — Other Ambulatory Visit: Payer: Self-pay | Admitting: Family Medicine

## 2023-12-12 DIAGNOSIS — G8929 Other chronic pain: Secondary | ICD-10-CM

## 2023-12-12 NOTE — Telephone Encounter (Signed)
 Meloxicam  Last filled:  11/21/23, #60 Last OV:  10/24/23, back pain Next OV:  12/24/23, 2 mo f/u

## 2023-12-15 NOTE — Telephone Encounter (Signed)
 ERx

## 2023-12-19 NOTE — Progress Notes (Signed)
 Referring Physician:  Claire Crick, MD [address] [place],  [place] [address]  Primary Physician:  Claire Crick, MD  History of Present Illness: 12/22/2023 Stacey Nichols has a history of OSA, NAFLD, GERD, osteopenia, history of gastric banding surgery and gastric sleeve, dyslipidemia, AML in remission, depression, prediabetes, history of stroke, ?FM.    Interpreter used as patient speaks spanish.   She has 3 month history of constant LBP with intermittent left leg pain (entire leg) to her foot and right lateral thigh pain. LBP = leg pain. Left leg pain > right leg pain. Pain is worse with walking- she's using a cane. Pain is also worse with sitting and standing. No alleviating factors. She has numbness, tingling, and weakness in her legs.   She is taking tylenol , robaxin , and naproxen - these are not helping much.   She does not smoke.   Bowel/Bladder Dysfunction: none  Conservative measures:  Physical therapy: has not participated in PT  Multimodal medical therapy including regular antiinflammatories: naproxen , Robaxin , Tylenol   Injections: no epidural steroid injections  Past Surgery: none  Stacey Nichols has no symptoms of cervical myelopathy. She has right middle trigger finger.   The symptoms are causing a significant impact on the patient's life.   Review of Systems:  A 10 point review of systems is negative, except for the pertinent positives and negatives detailed in the HPI.  Past Medical History: Past Medical History:  Diagnosis Date   AML (acute myeloblastic leukemia) (HCC) 2007   s/p chemo (Dr Carlon Chester)   Anemia    hx of   Anxiety    Arthritis    severe   Constipation    COVID-19 virus infection 07/04/2021   Depression    Diabetes mellitus without complication (HCC)    Fatty liver    GERD (gastroesophageal reflux disease)    H/O Bell's palsy    H/O hiatal hernia    Headache    occasional   High triglycerides    History of cancer  chemotherapy    Leukemia (HCC)    Morbidly obese (HCC)    Osteopenia 06/08/2017   DEXA 2017 - T -1.8 spine   Pneumonia    hx of    Rheumatoid arthritis (HCC)    Sleep apnea    Stroke (HCC)    "mini stroke-caused bells palsy for 1 month"    Past Surgical History: Past Surgical History:  Procedure Laterality Date   ABDOMINAL HYSTERECTOMY  2001   heavy bleeding, fibroids, 1 ovary remains   BLEPHAROPLASTY Bilateral 06/2020   in [place]   COLONOSCOPY WITH PROPOFOL  N/A 09/19/2020   TA, int hem, rpt 7 yrs Ole Berkeley, Darren, MD)   FACIAL COSMETIC SURGERY  06/2020   in [place]   LAPAROSCOPIC GASTRIC BAND REMOVAL WITH LAPAROSCOPIC GASTRIC SLEEVE RESECTION  04/26/2014   LAPAROSCOPIC GASTRIC BANDING  2000   performed in [place]   REPLACEMENT TOTAL KNEE Left 10/2017   Dr [facility]   REPLACEMENT TOTAL KNEE Right 06/2018   Dr Nowell [facility]   TUBAL LIGATION  1996   UPPER GI ENDOSCOPY N/A 04/26/2014   Procedure: UPPER GI ENDOSCOPY;  Surgeon: Azucena Bollard, MD;  Location: [facility];  Service: General;  Laterality: N/A;    Allergies: Allergies as of 12/22/2023 - Review Complete 10/25/2023  Allergen Reaction Noted   Latex Other (See Comments) and Itching 01/10/2014   Sulfa antibiotics Rash 09/28/2006   Tape Rash 06/20/2015    Medications: Outpatient  Encounter Medications as of 12/22/2023  Medication Sig   acetaminophen  (TYLENOL ) 500 MG tablet Take 500 mg by mouth every 6 (six) hours as needed.   albuterol  (VENTOLIN  HFA) 108 (90 Base) MCG/ACT inhaler Inhale 2 puffs into the lungs every 6 (six) hours as needed for wheezing or shortness of breath. Please schedule office visit before any future refills.   aspirin 81 MG tablet Take 81 mg by mouth daily.   b complex vitamins capsule Take 1 capsule by mouth once a week.   Biotin  5 MG CAPS Take 1 capsule (5 mg total) by mouth daily.   Cholecalciferol  1.25 MG (50000 UT) TABS Take 1 tablet by mouth once a week.   diazepam  (VALIUM ) 5 MG  tablet Take 1 tablet (5 mg total) by mouth every 12 (twelve) hours as needed for anxiety (MRI - sedation precautions).   metFORMIN  (GLUCOPHAGE ) 500 MG tablet Take 1 tablet (500 mg total) by mouth daily with breakfast.   methocarbamol  (ROBAXIN ) 500 MG tablet TAKE 1-2 TABLETS (500-1,000 MG TOTAL) BY MOUTH 2 (TWO) TIMES DAILY AS NEEDED FOR MUSCLE SPASMS.   Multiple Vitamin (MULTIVITAMIN) tablet Take 1 tablet by mouth daily.   naproxen  (NAPROSYN ) 500 MG tablet Take 1 tablet (500 mg total) by mouth daily as needed for moderate pain.   pantoprazole  (PROTONIX ) 40 MG tablet Take 1 tablet (40 mg total) by mouth daily.   rosuvastatin  (CRESTOR ) 10 MG tablet Take 1 tablet (10 mg total) by mouth 2 (two) times a week.   traZODone  (DESYREL ) 50 MG tablet TAKE 1/2 TO 1 TABLET BY MOUTH AT BEDTIME AS NEEDED FOR SLEEP   No facility-administered encounter medications on file as of 12/22/2023.    Social History: Social History   Tobacco Use   Smoking status: Former    Current packs/day: 0.00    Types: Cigarettes    Start date: 03/30/1988    Quit date: 03/30/2018    Years since quitting: 5.7   Smokeless tobacco: Never  Vaping Use   Vaping status: Never Used  Substance Use Topics   Alcohol use: Yes    Comment: occasional   Drug use: No    Family Medical History: Family History  Problem Relation Age of Onset   Cancer Mother        cervix   Hyperlipidemia Father    Obesity Father    CAD Father        MI and arrhythmia   Heart Problems Father    Cancer Sister        metastatic, ?pancrease primary   Diabetes Maternal Grandfather    Breast cancer Neg Hx     Physical Examination: There were no vitals filed for this visit.  General: Patient is well developed, well nourished, calm, collected, and in no apparent distress. Attention to examination is appropriate.  Respiratory: Patient is breathing without any difficulty.   NEUROLOGICAL:     Awake, alert, oriented to person, place, and time.   Speech is clear and fluent. Fund of knowledge is appropriate.   Cranial Nerves: Pupils equal round and reactive to light.  Facial tone is symmetric.    No abnormal lesions on exposed skin.   Strength: Side Biceps Triceps Deltoid Interossei Grip Wrist Ext. Wrist Flex.  R 5 5 5 5 5 5 5   L 5 5 5 5 5 5 5    Side Iliopsoas Quads Hamstring PF DF EHL  R 5 5 5 5 5 5   L 5 5 5 5 5  5  Reflexes are 2+ and symmetric at the biceps, brachioradialis, patella and achilles.   Hoffman's is absent.  Clonus is not present.   Bilateral upper and lower extremity sensation is intact to light touch.     She has increased LBP with IR/ER of both hips. No groin pain.   She is able to heel/toe stand on both right and left leg.   No current triggering of right middle finger, but she wakes up with it stuck in the morning. Tender over A1 pulley.   She ambulates with cane.   Medical Decision Making  Imaging: Lumbar xrays dated 10/24/23:  FINDINGS: There is no evidence of lumbar spine fracture. Interval worsening of multilevel moderate degenerative changes of the spine. Interval development of dextroscoliosis centered at the L3 level with associated 1.4 cm left to right translocation of the L4 vertebral body relation to the L5 level. Suggestion of likely osseous neural foraminal stenosis. Multilevel moderate intervertebral disc space narrowing, most prominent at the L5-S1 level.   IMPRESSION: Interval development of dextroscoliosis centered at the L3 level with associated 1.4 cm left to right translocation of the L4 vertebral body relation to the L5 level. Consider cross-sectional imaging for further evaluation.     Electronically Signed   By: Morgane  Naveau M.D.   On: 10/25/2023 00:19    Lumbar MRI dated 11/19/23:  FINDINGS: Segmentation:  Standard.   Alignment:  Trace scoliosis and mild L4-5 anterolisthesis   Vertebrae: No fracture, evidence of discitis, or bone lesion. Mild degenerative  superior endplate edema at L4   Conus medullaris and cauda equina: Conus extends to the L1-2 level. Conus and cauda equina appear normal.   Paraspinal and other soft tissues: No perispinal mass or inflammation.   Disc levels:   T12- L1: Disc bulging and right facet spurring.   L1-L2: Circumferential disc bulging.  Negative facets   L2-L3: Disc bulging with narrowing and desiccation. Small central protrusion. Mild degenerative facet spurring.   L3-L4: Disc narrowing and bulging with moderate facet spurring. Degeneration and epidural fat expansion causes moderate thecal sac stenosis, 6 mm in the midline. Left foraminal to extraforaminal herniation contacting the left L3 nerve root.   L4-L5: Disc narrowing and bulging with tiny central protrusion. Degenerative facet spurring with ligamentum flavum thickening greater on the right. Epidural fat and degeneration causes moderate thecal sac stenosis. Patent foramina.   L5-S1:Disc desiccation and narrowing with small central protrusion. There is a right foraminal protrusion up lifting the L5 nerve root. Degenerative facet spurring greater on the right.   IMPRESSION: Generalized lumbar spine degeneration with scoliosis.   Focal foraminal impingement on the right at L5-S1 mainly due to disc protrusion.   On the symptomatic left side there is a L3-4 left far-lateral herniation which could affect the left L3 nerve root.   Degeneration and epidural fat causes moderate thecal sac stenosis at L3-4 and L4-5.     Electronically Signed   By: Ronnette Coke M.D.   On: 12/05/2023 08:11    I have personally reviewed the images and agree with the above interpretation.  Assessment and Plan: Ms. Gullikson has a 3 month history of constant LBP with intermittent left leg pain (entire leg) to her foot and right lateral thigh pain. LBP = leg pain. Left leg pain > right leg pain. She has numbness, tingling, and weakness in her legs.   She has  right sided lumbar curve with lateral listhesis L3-L4 to right and slip L4-L5. She has far lateral  disc on left at L3-L4 that affects left L3 nerve along with moderate central stenosis L3-L5, and right foraminal disc L5-S1 with moderate right foraminal stenosis.   LBP likely due to underlying spondylosis. L3-L4 may be contributing to left leg pain, but would not explain pain to foot. Right leg pain may be from L5-S1.   She also has probable right middle trigger finger.   Treatment options discussed with patient and following plan made:   - Order for physical therapy for lumbar spine to [facility]. Patient to call to schedule appointment.  - Prescription for medrol  dose pack to help with symptoms. Reviewed dosing and side effects. Take as directed. Stop naproxen .  - Prescription for flexeril to take prn muscle spasms. Reviewed dosing and side effects. Discussed this can cause drowsiness. Stop robaxin .  - Referral to pain management (Lateef) to discuss possible lumbar injections.  -Referral to ortho for right middle trigger finger. Will have her see Dr. Ernesta Heading here in [place].  - If no improvement with above, consider referral to Dr. Mont Antis to discuss options. Would get full length scoliosis xrays prior to that visit. May also need to discuss weight loss.   I spent a total of 45 minutes in face-to-face and non-face-to-face activities related to this patient's care today including review of outside records, review of imaging, review of symptoms, physical exam, discussion of differential diagnosis, discussion of treatment options, and documentation.   Thank you for involving me in the care of this patient.   Lucetta Russel PA-C Dept. of Neurosurgery

## 2023-12-22 ENCOUNTER — Ambulatory Visit (INDEPENDENT_AMBULATORY_CARE_PROVIDER_SITE_OTHER): Admitting: Orthopedic Surgery

## 2023-12-22 ENCOUNTER — Encounter: Payer: Self-pay | Admitting: Orthopedic Surgery

## 2023-12-22 VITALS — BP 124/82 | Ht 62.5 in | Wt 246.0 lb

## 2023-12-22 DIAGNOSIS — M47816 Spondylosis without myelopathy or radiculopathy, lumbar region: Secondary | ICD-10-CM

## 2023-12-22 DIAGNOSIS — M5416 Radiculopathy, lumbar region: Secondary | ICD-10-CM

## 2023-12-22 DIAGNOSIS — M4316 Spondylolisthesis, lumbar region: Secondary | ICD-10-CM

## 2023-12-22 DIAGNOSIS — M48061 Spinal stenosis, lumbar region without neurogenic claudication: Secondary | ICD-10-CM

## 2023-12-22 DIAGNOSIS — M5126 Other intervertebral disc displacement, lumbar region: Secondary | ICD-10-CM

## 2023-12-22 DIAGNOSIS — M4120 Other idiopathic scoliosis, site unspecified: Secondary | ICD-10-CM

## 2023-12-22 DIAGNOSIS — M65331 Trigger finger, right middle finger: Secondary | ICD-10-CM

## 2023-12-22 DIAGNOSIS — M4726 Other spondylosis with radiculopathy, lumbar region: Secondary | ICD-10-CM

## 2023-12-22 MED ORDER — CYCLOBENZAPRINE HCL 10 MG PO TABS: 10.0000 mg | ORAL_TABLET | Freq: Three times a day (TID) | ORAL | 0 refills | Status: DC | PRN

## 2023-12-22 MED ORDER — METHYLPREDNISOLONE 4 MG PO TBPK
ORAL_TABLET | ORAL | 0 refills | Status: DC
Start: 2023-12-22 — End: 2024-03-01

## 2023-12-22 NOTE — Patient Instructions (Signed)
 It was so nice to see you today. Thank you so much for coming in.    I sent physical therapy orders to [facility]. You need to  call them at the number below to schedule your visit.   I sent a steroid dose pack to help with pain and inflammation. Take as directed. Stop the naproxen .   I also sent a prescription for cyclobenzaprine to help with muscle spasms. Use only as needed and be careful, this can make you sleepy. Stop the methocarbamol .   I want you to see pain management here in [place] (Dr. Rhesa Celeste) to discuss possible lumbar injections. They should call you to schedule an appointment or you can call them at [phone].   I will see you back in 8 weeks. Please do not hesitate to call if you have any questions or concerns. You can also message me in MyChart.   Lucetta Russel PA-C [phone]     The physicians and staff at [facility] are committed to providing excellent care. You may receive a survey asking for feedback about your experience at our office. We value you your feedback and appreciate you taking the time to to fill it out. The [facility] leadership team is also available to discuss your experience in person, feel free to contact us  [phone].

## 2023-12-24 ENCOUNTER — Ambulatory Visit: Admitting: Family Medicine

## 2023-12-24 ENCOUNTER — Encounter: Payer: Self-pay | Admitting: Family Medicine

## 2023-12-24 ENCOUNTER — Ambulatory Visit: Payer: Self-pay | Admitting: Family Medicine

## 2023-12-24 VITALS — BP 126/82 | HR 92 | Temp 98.4°F | Ht 62.5 in | Wt 248.4 lb

## 2023-12-24 DIAGNOSIS — R7303 Prediabetes: Secondary | ICD-10-CM

## 2023-12-24 DIAGNOSIS — M51362 Other intervertebral disc degeneration, lumbar region with discogenic back pain and lower extremity pain: Secondary | ICD-10-CM

## 2023-12-24 DIAGNOSIS — E1169 Type 2 diabetes mellitus with other specified complication: Secondary | ICD-10-CM | POA: Diagnosis not present

## 2023-12-24 DIAGNOSIS — Z7984 Long term (current) use of oral hypoglycemic drugs: Secondary | ICD-10-CM

## 2023-12-24 DIAGNOSIS — G4733 Obstructive sleep apnea (adult) (pediatric): Secondary | ICD-10-CM | POA: Diagnosis not present

## 2023-12-24 DIAGNOSIS — Z7985 Long-term (current) use of injectable non-insulin antidiabetic drugs: Secondary | ICD-10-CM

## 2023-12-24 LAB — GLUCOSE, POCT (MANUAL RESULT ENTRY): POC Glucose: 136 mg/dL — AB (ref 70–99)

## 2023-12-24 LAB — POCT GLYCOSYLATED HEMOGLOBIN (HGB A1C): Hemoglobin A1C: 6.1 % — AB (ref 4.0–5.6)

## 2023-12-24 NOTE — Telephone Encounter (Signed)
 Verified with patient today: She DOES NOT HAVE [place] [employer] [place] [employer]. She only has [employer].

## 2023-12-24 NOTE — Progress Notes (Signed)
 Ph: [phone] Fax: [phone]   Patient ID: Stacey Nichols, female    DOB: March 03, 1961, 63 y.o.   MRN: [id number]  This visit was conducted in person.  BP 126/82   Pulse 92   Temp 98.4 F (36.9 C) (Oral)   Ht 5' 2.5 (1.588 m)   Wt 248 lb 6 oz (112.7 kg)   SpO2 94%   BMI 44.70 kg/m    CC: 2 mo f/u visit  Subjective:   HPI: Stacey Nichols is a 63 y.o. female presenting on 12/24/2023 for Medical Management of Chronic Issues (Here for 2 mo f/u.)   See prior note for details.  Insulin  resistance OGTT returned consistent with prediabetes (OGTT = 125, 236 (1hr), 191 (2hr), 86 (3hr)). Last visit we started metformin  500mg  daily. She is tolerating this well.  She has also restarted crestor  10mg  twice weekly.  Last meal was 1pm  Saw neurosurgery Lucetta Russel PA for 3 months of LBP with L >R leg pain as well as numbness, tingling, weakness.  Planned PT through [facility]. Also prescribed medrol  dosepak. Naprosyn  was stopped. Rx flexeril  PRN in place of robaxin . Referred to Dr Rhesa Celeste pain management to discuss lumbar injections. If not better with this, will f/u with NSG Mont Antis) for further recommendations.   Very sedentary these last 2 months due to severe back pain.  Has made an effort to increase walking despite pain.   Known OSA on CPAP followed by Dr Mason Sole neurology unsure pressure settings.   Pt's insurance - [employer] Next - she states she does not have [place] [employer] [place] Complete      Relevant past medical, surgical, family and social history reviewed and updated as indicated. Interim medical history since our last visit reviewed. Allergies and medications reviewed and updated. Outpatient Medications Prior to Visit  Medication Sig Dispense Refill   acetaminophen  (TYLENOL ) 500 MG tablet Take 500 mg by mouth every 6 (six) hours as needed.     albuterol  (VENTOLIN  HFA) 108 (90 Base) MCG/ACT inhaler Inhale 2 puffs into the lungs every 6 (six) hours as needed for  wheezing or shortness of breath. Please schedule office visit before any future refills. 18 each 0   aspirin 81 MG tablet Take 81 mg by mouth daily.     b complex vitamins capsule Take 1 capsule by mouth once a week.     Biotin  5 MG CAPS Take 1 capsule (5 mg total) by mouth daily.     Cholecalciferol  1.25 MG (50000 UT) TABS Take 1 tablet by mouth once a week. 12 tablet 4   cyclobenzaprine  (FLEXERIL ) 10 MG tablet Take 1 tablet (10 mg total) by mouth 3 (three) times daily as needed for muscle spasms. This can make you sleepy. 60 tablet 0   metFORMIN  (GLUCOPHAGE ) 500 MG tablet Take 1 tablet (500 mg total) by mouth daily with breakfast. 90 tablet 3   methylPREDNISolone  (MEDROL  DOSEPAK) 4 MG TBPK tablet Use as directed x 6 days. 1 each 0   Multiple Vitamin (MULTIVITAMIN) tablet Take 1 tablet by mouth daily.     pantoprazole  (PROTONIX ) 40 MG tablet Take 1 tablet (40 mg total) by mouth daily. 30 tablet 6   RESTASIS 0.05 % ophthalmic emulsion      rosuvastatin  (CRESTOR ) 10 MG tablet Take 1 tablet (10 mg total) by mouth 2 (two) times a week. 24 tablet 3   traZODone  (DESYREL ) 50 MG tablet TAKE 1/2 TO 1 TABLET BY MOUTH AT BEDTIME AS NEEDED  FOR SLEEP 90 tablet 3   diazepam  (VALIUM ) 5 MG tablet Take 1 tablet (5 mg total) by mouth every 12 (twelve) hours as needed for anxiety (MRI - sedation precautions). 2 tablet 0   No facility-administered medications prior to visit.     Per HPI unless specifically indicated in ROS section below Review of Systems  Objective:  BP 126/82   Pulse 92   Temp 98.4 F (36.9 C) (Oral)   Ht 5' 2.5 (1.588 m)   Wt 248 lb 6 oz (112.7 kg)   SpO2 94%   BMI 44.70 kg/m   Wt Readings from Last 3 Encounters:  12/24/23 248 lb 6 oz (112.7 kg)  12/22/23 246 lb (111.6 kg)  10/24/23 237 lb 8 oz (107.7 kg)      Physical Exam Vitals and nursing note reviewed.  Constitutional:      Appearance: Normal appearance. She is not ill-appearing.     Comments: Ambulates with cane   HENT:     Head: Normocephalic and atraumatic.     Mouth/Throat:     Mouth: Mucous membranes are moist.     Pharynx: Oropharynx is clear. No oropharyngeal exudate or posterior oropharyngeal erythema.   Eyes:     Extraocular Movements: Extraocular movements intact.     Conjunctiva/sclera: Conjunctivae normal.     Pupils: Pupils are equal, round, and reactive to light.    Cardiovascular:     Rate and Rhythm: Normal rate and regular rhythm.     Pulses: Normal pulses.     Heart sounds: Normal heart sounds. No murmur heard. Pulmonary:     Effort: Pulmonary effort is normal. No respiratory distress.     Breath sounds: Normal breath sounds. No wheezing, rhonchi or rales.   Musculoskeletal:     Right lower leg: No edema.     Left lower leg: No edema.   Skin:    General: Skin is warm and dry.     Findings: No rash.   Neurological:     Mental Status: She is alert.   Psychiatric:        Mood and Affect: Mood normal.        Behavior: Behavior normal.       Results for orders placed or performed in visit on 12/24/23  POCT glycosylated hemoglobin (Hb A1C)   Collection Time: 12/24/23  4:22 PM  Result Value Ref Range   Hemoglobin A1C 6.1 (A) 4.0 - 5.6 %   HbA1c POC (<> result, manual entry)     HbA1c, POC (prediabetic range)     HbA1c, POC (controlled diabetic range)    POCT glucose (manual entry)   Collection Time: 12/24/23  4:22 PM  Result Value Ref Range   POC Glucose 136 (A) 70 - 99 mg/dl   Lab Results  Component Value Date   CHOL 166 09/24/2023   HDL 38.50 (L) 09/24/2023   LDLCALC 48 09/24/2023   LDLDIRECT 47.0 06/28/2020   TRIG 398.0 (H) 09/24/2023   CHOLHDL 4 09/24/2023    Lab Results  Component Value Date   NA 141 09/24/2023   CL 104 09/24/2023   K 4.0 09/24/2023   CO2 27 09/24/2023   BUN 23 09/24/2023   CREATININE 0.92 09/24/2023   GFR 67.11 09/24/2023   CALCIUM  9.5 09/24/2023   ALBUMIN 4.0 10/08/2023   GLUCOSE 125 11/11/2023   MR Lumbar Spine Wo  Contrast CLINICAL DATA:  Lumbar radiculopathy with symptoms persisting over 6 weeks of treatment  EXAM: MRI LUMBAR  SPINE WITHOUT CONTRAST  TECHNIQUE: Multiplanar, multisequence MR imaging of the lumbar spine was performed. No intravenous contrast was administered.  COMPARISON:  None Available.  FINDINGS: Segmentation:  Standard.  Alignment:  Trace scoliosis and mild L4-5 anterolisthesis  Vertebrae: No fracture, evidence of discitis, or bone lesion. Mild degenerative superior endplate edema at L4  Conus medullaris and cauda equina: Conus extends to the L1-2 level. Conus and cauda equina appear normal.  Paraspinal and other soft tissues: No perispinal mass or inflammation.  Disc levels:  T12- L1: Disc bulging and right facet spurring.  L1-L2: Circumferential disc bulging.  Negative facets  L2-L3: Disc bulging with narrowing and desiccation. Small central protrusion. Mild degenerative facet spurring.  L3-L4: Disc narrowing and bulging with moderate facet spurring. Degeneration and epidural fat expansion causes moderate thecal sac stenosis, 6 mm in the midline. Left foraminal to extraforaminal herniation contacting the left L3 nerve root.  L4-L5: Disc narrowing and bulging with tiny central protrusion. Degenerative facet spurring with ligamentum flavum thickening greater on the right. Epidural fat and degeneration causes moderate thecal sac stenosis. Patent foramina.  L5-S1:Disc desiccation and narrowing with small central protrusion. There is a right foraminal protrusion up lifting the L5 nerve root. Degenerative facet spurring greater on the right.  IMPRESSION: Generalized lumbar spine degeneration with scoliosis.  Focal foraminal impingement on the right at L5-S1 mainly due to disc protrusion.  On the symptomatic left side there is a L3-4 left far-lateral herniation which could affect the left L3 nerve root.  Degeneration and epidural fat causes moderate  thecal sac stenosis at L3-4 and L4-5.  Electronically Signed   By: Ronnette Coke M.D.   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  Saw neurosurgery - Rx medrol  doespak with benefit, rec PT and PM&R eval (Lateef) for lumbar injection discussion. Planned NSG f/u if not improved with above.       Obesity, morbid, BMI 40.0-49.9 (HCC)   H/o swedish lap band in [place] 2000s, 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  Rx Zepbound  as per above. Starting weight 248 lbs  Activity limited by back pain.       Relevant Medications   tirzepatide  (MOUNJARO) 2.5 MG/0.5ML Pen   tirzepatide  (MOUNJARO) 5 MG/0.5ML Pen (Start on 01/23/2024)   OSA (obstructive sleep apnea)   See above. Rx Zepbound . Continues CPAP through neurology Mason Sole)      Type 2 diabetes mellitus with other specified complication (HCC) - Primary   Random cbg today 136, A1c 6.1%. She is now on metformin  500mg  daily and tolerating well. Consider titration.  She did have 1 hour OGTT of 236. She also had random sugar 212 on CMP from 09/24/2023.  With both these readings >200, she does meet criteria for T2DM diagnosis - will Rx Zepbound  for this indication as well as OSA hx.  RTC 3 mo DM f/u visit       Relevant Medications   tirzepatide  (MOUNJARO) 2.5 MG/0.5ML Pen   tirzepatide  (MOUNJARO) 5 MG/0.5ML Pen (Start on 01/23/2024)     Meds ordered this encounter  Medications   tirzepatide  (MOUNJARO) 2.5 MG/0.5ML Pen    Sig: Inject 2.5 mg into the skin once a  week.    Dispense:  2 mL    Refill:  0   tirzepatide  (MOUNJARO) 5 MG/0.5ML Pen    Sig: Inject 5 mg into the skin once a week.    Dispense:  6 mL    Refill:  1     Orders Placed This Encounter  Procedures   POCT glycosylated hemoglobin (Hb A1C)   POCT glucose (manual entry)    Patient Instructions  Random sugar today, A1c fingerstick today  Esperaremos resultados de terapia fisica y evaluacion de doctor de espalda Dr Rhesa Celeste.  Mandare zepbound  a la farmacia para ver si seguro lo cubre para apnea de sueo.   Follow up plan: Return in about 3 months (around 03/25/2024), or if symptoms worsen or fail to improve, for follow up visit.  Claire Crick, MD

## 2023-12-24 NOTE — Patient Instructions (Addendum)
 Random sugar today, A1c fingerstick today  Esperaremos resultados de terapia fisica y evaluacion de doctor de espalda Dr Rhesa Celeste.  Mandare zepbound  a la farmacia para ver si seguro lo cubre para apnea de sueo.

## 2023-12-26 ENCOUNTER — Other Ambulatory Visit (HOSPITAL_COMMUNITY): Payer: Self-pay

## 2023-12-26 ENCOUNTER — Telehealth: Payer: Self-pay

## 2023-12-26 ENCOUNTER — Encounter: Payer: Self-pay | Admitting: Family Medicine

## 2023-12-26 MED ORDER — TIRZEPATIDE 5 MG/0.5ML ~~LOC~~ SOAJ: 5.0000 mg | SUBCUTANEOUS | 1 refills | Status: DC

## 2023-12-26 MED ORDER — TIRZEPATIDE 2.5 MG/0.5ML ~~LOC~~ SOAJ: 2.5000 mg | SUBCUTANEOUS | 0 refills | Status: DC

## 2023-12-26 NOTE — Assessment & Plan Note (Signed)
 See above. Rx Zepbound . Continues CPAP through neurology Mason Sole)

## 2023-12-26 NOTE — Assessment & Plan Note (Addendum)
 Random cbg today 136, A1c 6.1%. She is now on metformin  500mg  daily and tolerating well. Consider titration.  She did have 1 hour OGTT of 236. She also had random sugar 212 on CMP from 09/24/2023.  With both these readings >200, she does meet criteria for T2DM diagnosis - will Rx Zepbound  for this indication as well as OSA hx.  RTC 3 mo DM f/u visit

## 2023-12-26 NOTE — Assessment & Plan Note (Addendum)
 H/o swedish lap band in [place] 2000s, 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  Rx Zepbound  as per above. Starting weight 248 lbs  Activity limited by back pain.

## 2023-12-26 NOTE — Telephone Encounter (Signed)
 Ozempic Stacey Nichols is approved exclusively as an adjunct to diet and exercise to improve glycemic  control in adults with type 2 diabetes mellitus. A review of patient's medical chart reveals no  documented diagnosis of type 2 diabetes or an A1C indicative of diabetes. Therefore, they do not  currently meet the criteria for prior authorization of this medication. If clinically appropriate, alternative  options such as Saxenda , Zepbound , or Wegovy  may be considered for this patient.   Patient has both prediabetes and type 2 diabetes as diagnosis from 12/24/23. However there is no A1C over 6.3 (07/20/21)to confirm diabetes diagnosis. Ran test bill for Zepbound  and Adipex, patient has plan benefit exclusion for weight loss medications.

## 2023-12-26 NOTE — Assessment & Plan Note (Addendum)
 MRI showed DDD with scoliosis, focal foraminal impingement on R L5/S1 due to disc protrusion, and L3/4 left lateral herniation could affect L3 nerve root.  Saw neurosurgery - Rx medrol  doespak with benefit, rec PT and PM&R eval (Lateef) for lumbar injection discussion. Planned NSG f/u if not improved with above.

## 2023-12-30 ENCOUNTER — Ambulatory Visit: Attending: Orthopedic Surgery

## 2023-12-30 DIAGNOSIS — M5416 Radiculopathy, lumbar region: Secondary | ICD-10-CM | POA: Insufficient documentation

## 2023-12-30 DIAGNOSIS — M4316 Spondylolisthesis, lumbar region: Secondary | ICD-10-CM | POA: Diagnosis not present

## 2023-12-30 DIAGNOSIS — M5432 Sciatica, left side: Secondary | ICD-10-CM | POA: Insufficient documentation

## 2023-12-30 DIAGNOSIS — M5459 Other low back pain: Secondary | ICD-10-CM | POA: Insufficient documentation

## 2023-12-30 DIAGNOSIS — R262 Difficulty in walking, not elsewhere classified: Secondary | ICD-10-CM | POA: Diagnosis present

## 2023-12-30 DIAGNOSIS — M5431 Sciatica, right side: Secondary | ICD-10-CM | POA: Diagnosis not present

## 2023-12-30 DIAGNOSIS — M65331 Trigger finger, right middle finger: Secondary | ICD-10-CM | POA: Insufficient documentation

## 2023-12-30 DIAGNOSIS — M47816 Spondylosis without myelopathy or radiculopathy, lumbar region: Secondary | ICD-10-CM | POA: Insufficient documentation

## 2023-12-30 DIAGNOSIS — M5126 Other intervertebral disc displacement, lumbar region: Secondary | ICD-10-CM | POA: Diagnosis not present

## 2023-12-30 DIAGNOSIS — M4120 Other idiopathic scoliosis, site unspecified: Secondary | ICD-10-CM | POA: Diagnosis not present

## 2023-12-30 NOTE — Therapy (Signed)
 OUTPATIENT PHYSICAL THERAPY THORACOLUMBAR EVALUATION   Patient Name: Stacey Nichols MRN: [id number] DOB:27-Nov-1960, 63 y.o., female Today's Date: 12/30/2023  END OF SESSION:  PT End of Session - 12/30/23 1119     Visit Number 1    Number of Visits 17    Date for PT Re-Evaluation 02/27/24    PT Start Time 1120    PT Stop Time 1206    PT Time Calculation (min) 46 min    Activity Tolerance Patient tolerated treatment well;Patient limited by pain    Behavior During Therapy [facility] for tasks assessed/performed          Past Medical History:  Diagnosis Date   AML (acute myeloblastic leukemia) (HCC) 2007   s/p chemo (Dr Carlon Chester)   Anemia    hx of   Anxiety    Arthritis    severe   Constipation    COVID-19 virus infection 07/04/2021   Depression    Diabetes mellitus without complication (HCC)    Fatty liver    GERD (gastroesophageal reflux disease)    H/O Bell's palsy    H/O hiatal hernia    Headache    occasional   High triglycerides    History of cancer chemotherapy    Leukemia (HCC)    Morbidly obese (HCC)    Osteopenia 06/08/2017   DEXA 2017 - T -1.8 spine   Pneumonia    hx of    Rheumatoid arthritis (HCC)    Sleep apnea    Stroke (HCC)    mini stroke-caused bells palsy for 1 month   Past Surgical History:  Procedure Laterality Date   ABDOMINAL HYSTERECTOMY  2001   heavy bleeding, fibroids, 1 ovary remains   BLEPHAROPLASTY Bilateral 06/2020   in [place]   COLONOSCOPY WITH PROPOFOL  N/A 09/19/2020   TA, int hem, rpt 7 yrs Ole Berkeley, Darren, MD)   FACIAL COSMETIC SURGERY  06/2020   in [place]   LAPAROSCOPIC GASTRIC BAND REMOVAL WITH LAPAROSCOPIC GASTRIC SLEEVE RESECTION  04/26/2014   LAPAROSCOPIC GASTRIC BANDING  2000   performed in [place]   REPLACEMENT TOTAL KNEE Left 10/2017   Dr Nowell [facility]un [facility]   REPLACEMENT TOTAL KNEE Right 06/2018   Dr Nowell Begun [facility]   TUBAL LIGATION  1996   UPPER GI ENDOSCOPY N/A 04/26/2014   Procedure: UPPER GI ENDOSCOPY;   Surgeon: Azucena Bollard, MD;  Location: [facility];  Service: General;  Laterality: N/A;   Patient Active Problem List   Diagnosis Date Noted   Abnormal x-ray of lumbar spine 10/25/2023   Abnormal urinalysis 10/25/2023   Generalized body aches 10/25/2023   Insulin  resistance syndrome 10/02/2023   Hyperinsulinemia 10/02/2023   Right wrist pain 10/02/2023   Gastroesophageal reflux disease without esophagitis 10/02/2023   Polyarthralgia 05/22/2023   Ex-smoker 03/12/2023   Osteoarthritis 12/10/2022   Insomnia 01/30/2022   Polyp of ascending colon    Recurrent respiratory infection 07/10/2020   Depression 07/10/2020   History of Bell's palsy 07/10/2020   History of colitis 07/10/2020   History of stroke 07/10/2020   Low serum vitamin B12 07/08/2020   Dysuria 06/28/2020   Dry mouth 06/28/2020   Verruca vulgaris 06/28/2020   Borderline hypothyroidism 04/08/2020   Encounter for general adult medical examination with abnormal findings 03/03/2018   NAFLD (nonalcoholic fatty liver disease) 14/78/2956   Osteopenia 06/08/2017   Trigeminal neuralgia 05/01/2017   Type 2 diabetes mellitus with other specified complication (HCC) 05/27/2016   Primary osteoarthritis of both knees 09/29/2015  Environmental and seasonal allergies 09/29/2015   Arthralgia of hands, bilateral 07/12/2015   Arthralgia of hip 07/12/2015   Degeneration of intervertebral disc of lumbar region with discogenic back pain and lower extremity pain 07/12/2015   Dyslipidemia 06/20/2015   Vitamin D  deficiency 06/20/2015   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   Obesity, morbid, BMI 40.0-49.9 (HCC) 01/10/2014   OSA (obstructive sleep apnea) 01/10/2014   Seasonal allergic rhinitis 09/30/2011   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   History of laparoscopic adjustable gastric banding 2000    PCP: Claire Crick, MD   REFERRING PROVIDER: Lucetta Russel, PA-C  REFERRING DIAG: [phone]  (ICD-10-CM) - Lumbar spondylosis M43.16 (ICD-10-CM) - Spondylolisthesis of lumbar region M41.20 (ICD-10-CM) - Other idiopathic scoliosis, unspecified spinal region M54.16 (ICD-10-CM) - Lumbar radiculopathy M51.26 (ICD-10-CM) - HNP (herniated nucleus pulposus), lumbar  Rationale for Evaluation and Treatment: Rehabilitation  THERAPY DIAG:  Other low back pain - Plan: PT plan of care cert/re-cert  Radiculopathy, lumbar region - Plan: PT plan of care cert/re-cert  Sciatica, left side - Plan: PT plan of care cert/re-cert  Sciatica, right side - Plan: PT plan of care cert/re-cert  Difficulty in walking, not elsewhere classified - Plan: PT plan of care cert/re-cert  ONSET DATE: February 2025  SUBJECTIVE:                                                                                                                                                                                           SUBJECTIVE STATEMENT: Low back: 10+/10 at worst for the past 3 months. Pain sometimes hurt in the morning and cannot move.  L LE: 10+/10 at worst for the past 3 months R LE: 8/10 at worst for the past 3 months.   PERTINENT HISTORY:  Low back pain. Feels like she does not need an interpreter. Pain started February 2025, got tests, was told that it was due to back bones. Was given medicine which has not helped. Pain has worsened since onset. Pain comes and goes. L LE bothers her (along L L5 dermatome, not past the knee), pt also feels pain at L hip joint. The R side also hurts some times (R L5 dermatome), not past the knee with numbness and pain. Going to get injections next week to see if that helps.   Unknown mechanism of injury, sudden onset L lateral thigh with gradual worsening of pain  Had something similar 4 years ago but the pain went away. This time, the pain has not gone away.   PLOF: was able to ambulate about 1.5 hours and do anything she wanted to.  Interpreter present. [facility])  Interpreter  was used during the majority of the session.   (Having interpreter during future sessions is recommended based on the eval)  No blood pressure problems per pt.   LATEX ALLERGIES   PAIN:  Are you having pain? Yes: NPRS scale: 0/10 (pt currently sitting) Pain location: low back, and L > R LE at L5 dermatome, not past the knees.  Pain description: sharp, pins and needles, numbness Aggravating factors: sitting down, standing up, laying down, staying still in one position Relieving factors: movement, change positions, walk a little bit.   PRECAUTIONS: Osteopenia  RED FLAGS: Bowel or bladder incontinence: No and Cauda equina syndrome: No   WEIGHT BEARING RESTRICTIONS: No  FALLS:  Has patient fallen in last 6 months? No  LIVING ENVIRONMENT: Lives with: lives alone Lives in: House/apartment Stairs: Yes: Internal: 15 steps; on left going up and External: 6 steps; none Has following equipment at home: Single point cane and walker without wheels  OCCUPATION: Not working.   PLOF: Independent  PATIENT GOALS: walk normally or the best that she could without pain.   NEXT MD VISIT: August 2025  OBJECTIVE:  Note: Objective measures were completed at Evaluation unless otherwise noted.  DIAGNOSTIC FINDINGS:  MR Lumbar Spine Wo Contrast  11/19/2023  Narrative & Impression  CLINICAL DATA:  Lumbar radiculopathy with symptoms persisting over 6 weeks of treatment   EXAM: MRI LUMBAR SPINE WITHOUT CONTRAST   TECHNIQUE: Multiplanar, multisequence MR imaging of the lumbar spine was performed. No intravenous contrast was administered.   COMPARISON:  None Available.   FINDINGS: Segmentation:  Standard.   Alignment:  Trace scoliosis and mild L4-5 anterolisthesis   Vertebrae: No fracture, evidence of discitis, or bone lesion. Mild degenerative superior endplate edema at L4   Conus medullaris and cauda equina: Conus extends to the L1-2 level. Conus and cauda equina appear normal.    Paraspinal and other soft tissues: No perispinal mass or inflammation.   Disc levels:   T12- L1: Disc bulging and right facet spurring.   L1-L2: Circumferential disc bulging.  Negative facets   L2-L3: Disc bulging with narrowing and desiccation. Small central protrusion. Mild degenerative facet spurring.   L3-L4: Disc narrowing and bulging with moderate facet spurring. Degeneration and epidural fat expansion causes moderate thecal sac stenosis, 6 mm in the midline. Left foraminal to extraforaminal herniation contacting the left L3 nerve root.   L4-L5: Disc narrowing and bulging with tiny central protrusion. Degenerative facet spurring with ligamentum flavum thickening greater on the right. Epidural fat and degeneration causes moderate thecal sac stenosis. Patent foramina.   L5-S1:Disc desiccation and narrowing with small central protrusion. There is a right foraminal protrusion up lifting the L5 nerve root. Degenerative facet spurring greater on the right.   IMPRESSION: Generalized lumbar spine degeneration with scoliosis.   Focal foraminal impingement on the right at L5-S1 mainly due to disc protrusion.   On the symptomatic left side there is a L3-4 left far-lateral herniation which could affect the left L3 nerve root.   Degeneration and epidural fat causes moderate thecal sac stenosis at L3-4 and L4-5.     Electronically Signed   By: Ronnette Coke M.D.   On: 12/05/2023 08:11   DG Lumbar Spine Complete 10/24/2023  Narrative & Impression  CLINICAL DATA:  left low back pain with radiculopathy x 6 + wks   EXAM: LUMBAR SPINE - COMPLETE 4+ VIEW   COMPARISON:  X-ray lumbar spine 07/12/2015   FINDINGS: There  is no evidence of lumbar spine fracture. Interval worsening of multilevel moderate degenerative changes of the spine. Interval development of dextroscoliosis centered at the L3 level with associated 1.4 cm left to right translocation of the L4 vertebral body  relation to the L5 level. Suggestion of likely osseous neural foraminal stenosis. Multilevel moderate intervertebral disc space narrowing, most prominent at the L5-S1 level.   IMPRESSION: Interval development of dextroscoliosis centered at the L3 level with associated 1.4 cm left to right translocation of the L4 vertebral body relation to the L5 level. Consider cross-sectional imaging for further evaluation.     Electronically Signed   By: Morgane  Naveau M.D.   On: 10/25/2023 00:19    DG Si Joints 10/24/2023  Narrative & Impression  CLINICAL DATA:  Left SI JT pain.   EXAM: BILATERAL SACROILIAC JOINTS - 3 VIEW   COMPARISON:  None Available.   FINDINGS: There is bilateral hip degenerative change with joint space narrowing and small osteophytes. Pelvic ring is intact. The sacroiliac joint spaces are maintained and there is no evidence of arthropathy. No acute fracture, dislocation or subluxation. No osteolytic or osteoblastic lesions.   IMPRESSION: Bilateral hip degenerative changes. No sacroiliac joint arthropathy identified. No acute osseous abnormalities.     Electronically Signed   By: Sydell Eva M.D.   On: 10/24/2023 21:53           PATIENT SURVEYS:  Modified Oswestry 17/50 (34%), (12/30/2023)   COGNITION: Overall cognitive status: Within functional limits for tasks assessed     SENSATION:   MUSCLE LENGTH:   POSTURE: R weight shift, forward neck, thoracic kyphosis, decreased L LE weight bearing, R lateral shift, increased R lateral shift around L2/3 area.   Increased L LE L5 dermatome pain with manual R lateral shift correction.    PALPATION:   LUMBAR ROM:   AROM eval  Flexion Full, no pain  Extension Limited with reproduction of back and L LE pain   Right lateral flexion full  Left lateral flexion Limited with L lateral thigh pain  Right rotation WFL with L lateral thigh pain  Left rotation WFL with low Back pain   (Blank rows =  not tested)  LOWER EXTREMITY ROM:     Passive  Right eval Left eval  Hip flexion    Hip extension    Hip abduction    Hip adduction    Hip internal rotation 33 12 with L posterior and anterior hip joint pain  Hip external rotation 64 60  Knee flexion    Knee extension    Ankle dorsiflexion    Ankle plantarflexion    Ankle inversion    Ankle eversion     (Blank rows = not tested)  LOWER EXTREMITY MMT:    MMT Right eval Left eval  Hip flexion 4 4- with L lateral posterior thigh pain  Hip extension (seated manually resisted) 3 3  Hip abduction (seated manually resisted) 4 4  Hip adduction    Hip internal rotation    Hip external rotation    Knee flexion 5 4  Knee extension 5 4+  Ankle dorsiflexion 5 5  Ankle plantarflexion    Ankle inversion    Ankle eversion     (Blank rows = not tested)  LUMBAR SPECIAL TESTS:  Long sit test suggests anterior nutation of L innominate.   FUNCTIONAL TESTS:    GAIT: Distance walked: 10 ft Assistive device utilized: Single point cane Level of assistance: Modified independence Comments: decreased  stance L LE, antalgic, SPC on R  TREATMENT DATE: 12/30/2023                                                                                                                                 PATIENT EDUCATION:  Education details: POC Person educated: Patient Education method: Explanation Education comprehension: verbalized understanding  HOME EXERCISE PROGRAM:   ASSESSMENT:  CLINICAL IMPRESSION: Patient is a 63 y.o. female who was seen today for physical therapy evaluation and treatment for low back pain. She also presents with B LE radiating symptoms, altered gait pattern and posture, decreased trunk, B hip and L LE weakness, positive special test suggesting lumbopelvic involvement and difficulty performing standing tasks, ambulating, and maintaining static positions secondary to back and LE pain. Pt will benefit from skilled  physical therapy services to address the aforementioned deficits.      OBJECTIVE IMPAIRMENTS: Abnormal gait, difficulty walking, decreased strength, improper body mechanics, postural dysfunction, and pain.   ACTIVITY LIMITATIONS: carrying, lifting, bending, sitting, standing, squatting, stairs, transfers, bed mobility, reach over head, and locomotion level  PARTICIPATION LIMITATIONS:   PERSONAL FACTORS: Fitness, Past/current experiences, Time since onset of injury/illness/exacerbation, and 3+ comorbidities: AML, anxiety, arthritis, DM, osteopenia, RA are also affecting patient's functional outcome.   REHAB POTENTIAL: Fair    CLINICAL DECISION MAKING: Evolving/moderate complexity, pain is worsening since onset per pt  EVALUATION COMPLEXITY: Moderate   GOALS: Goals reviewed with patient? Yes  SHORT TERM GOALS: Target date: 01/09/2024  Pt will be independent with her initial HEP to decrease back pain, improve strength, and function. Baseline: Pt has not yet started her initial HEP (12/30/2023). Goal status: INITIAL   LONG TERM GOALS: Target date: 02/27/2024  PT will improve her Modified Oswestry score by at least 10% as a demonstration of improved function.  Baseline: Modified Oswestry 17/50 (34%), (12/30/2023)  Goal status: INITIAL  2.  Pt will have a decrease in low back pain to 5/10 or less at worst to promote ability to ambulate as well as perform standing tasks more comfortably.  Baseline: Low back: 10+/10 at worst for the past 3 months. Pain sometimes hurt in the morning and cannot move (12/30/2023) Goal status: INITIAL  3.  Pt will have a decrease in L LE and R LE pain to 5/10 or less at worst to promote ability to ambulate as well as perform standing tasks more comfortably.  Baseline: L LE: 10+/10 at worst for the past 3 months, R LE: 8/10 at worst for the past 3 months. (12/30/2023) Goal status: INITIAL  4.  Pt will improve B hip strength by at least 1/2 MMT grade to  promote ability to perform standing tasks as well as ambulate more comfortably for her back and B LE. Baseline:  MMT Right eval Left eval  Hip flexion 4 4- with L lateral posterior thigh pain  Hip extension (seated manually resisted) 3 3  Hip abduction (seated manually  resisted) 4 4   Goal status: INITIAL   PLAN:  PT FREQUENCY: 1-2x/week  PT DURATION: 8 weeks  PLANNED INTERVENTIONS: 97110-Therapeutic exercises, 97530- Therapeutic activity, [id number]- Neuromuscular re-education, 97535- Self Care, [id number]- Manual therapy, [phone]- Gait training, [phone]- Aquatic Therapy, [phone]- Electrical stimulation (unattended), [phone]- Traction (mechanical), [id number]- Ionotophoresis 4mg /ml Dexamethasone , Patient/Family education, and Stair training.  PLAN FOR NEXT SESSION: improve posture, trunk and glute strength, manual techniques, modalities PRN   Muzamil Harker, PT, DPT 12/30/2023, 2:39 PM

## 2023-12-31 ENCOUNTER — Ambulatory Visit (INDEPENDENT_AMBULATORY_CARE_PROVIDER_SITE_OTHER): Admitting: Orthopedic Surgery

## 2023-12-31 DIAGNOSIS — M65331 Trigger finger, right middle finger: Secondary | ICD-10-CM | POA: Diagnosis not present

## 2023-12-31 NOTE — Patient Instructions (Signed)

## 2023-12-31 NOTE — Progress Notes (Unsigned)
 New Patient Visit  Assessment: Stacey Nichols is a 63 y.o. female with the following: Right long finger trigger finger  Plan: Symptoms and physical exam most consistent with trigger finger.  We discussed etiology, and potential treatment options.  Surgery was discussed in detail, including the plan for procedure, and expected recovery.  After discussing these options, the patient would like to proceed with a steroid injection.  This was completed in clinic today.  Depending on the efficacy of the injection, we could consider another injection.  However, if the injection does not provide sustained relief, I would recommend surgery.  All of this was discussed with the patient, and they are amenable to this plan.  Follow-up as needed.   Procedure note injection - Right Long finger A1 Pulley  Verbal consent was obtained to inject the Right Long finger A1 pulley Timeout was completed to confirm the site of injection.  The skin was prepped with alcohol and ethyl chloride was sprayed at the injection site.  A 21-gauge needle was used to inject 40 mg of Depo-Medrol  and 1% lidocaine  (1 cc) into the Right Long finger using a direct anterior approach.  There were no complications. Patient tolerated the procedure well. A sterile bandage was applied     Follow-up: Return if symptoms worsen or fail to improve.  Subjective:  Chief Complaint  Patient presents with   orther    Right long trigger finger     History of Present Illness: Stacey Nichols is a 63 y.o. female who presents for evaluation of Right Long finger pain.  She has been referred by Lucetta Russel, PA-C. 1 She notes pain and a catching sensation in the right long finger for the past 6+ months.  She has not pursued treatment yet.  She has to use the other hand to fully extend the long finger.  No injections.  Medications have not been helpful.   Review of Systems: No fevers or chills No numbness or tingling No chest pain No shortness  of breath No bowel or bladder dysfunction No GI distress No headaches   Medical History:  Past Medical History:  Diagnosis Date   AML (acute myeloblastic leukemia) (HCC) 2007   s/p chemo (Dr Carlon Chester)   Anemia    hx of   Anxiety    Arthritis    severe   Constipation    COVID-19 virus infection 07/04/2021   Depression    Diabetes mellitus without complication (HCC)    Fatty liver    GERD (gastroesophageal reflux disease)    H/O Bell's palsy    H/O hiatal hernia    Headache    occasional   High triglycerides    History of cancer chemotherapy    Leukemia (HCC)    Morbidly obese (HCC)    Osteopenia 06/08/2017   DEXA 2017 - T -1.8 spine   Pneumonia    hx of    Rheumatoid arthritis (HCC)    Sleep apnea    Stroke (HCC)    mini stroke-caused bells palsy for 1 month    Past Surgical History:  Procedure Laterality Date   ABDOMINAL HYSTERECTOMY  2001   heavy bleeding, fibroids, 1 ovary remains   BLEPHAROPLASTY Bilateral 06/2020   in [place]   COLONOSCOPY WITH PROPOFOL  N/A 09/19/2020   TA, int hem, rpt 7 yrs Ole Berkeley, Darren, MD)   FACIAL COSMETIC SURGERY  06/2020   in [place]   LAPAROSCOPIC GASTRIC BAND REMOVAL WITH LAPAROSCOPIC GASTRIC SLEEVE RESECTION  04/26/2014  LAPAROSCOPIC GASTRIC BANDING  2000   performed in [place]   REPLACEMENT TOTAL KNEE Left 10/2017   Dr Nowell [facility]   REPLACEMENT TOTAL KNEE Right 06/2018   Dr Nowell Begun [facility]   TUBAL LIGATION  1996   UPPER GI ENDOSCOPY N/A 04/26/2014   Procedure: UPPER GI ENDOSCOPY;  Surgeon: Azucena Bollard, MD;  Location: [facility];  Service: General;  Laterality: N/A;    Family History  Problem Relation Age of Onset   Cancer Mother        cervix   Hyperlipidemia Father    Obesity Father    CAD Father        MI and arrhythmia   Heart Problems Father    Cancer Sister        metastatic, ?pancrease primary   Diabetes Maternal Grandfather    Breast cancer Neg Hx    Social History   Tobacco Use   Smoking  status: Former    Current packs/day: 0.00    Types: Cigarettes    Start date: 03/30/1988    Quit date: 03/30/2018    Years since quitting: 5.7   Smokeless tobacco: Never  Vaping Use   Vaping status: Never Used  Substance Use Topics   Alcohol use: Yes    Comment: occasional   Drug use: No    Allergies  Allergen Reactions   Latex Other (See Comments) and Itching   Sulfa Antibiotics Rash    [Rash] rash - paper tape OK   Tape Rash    [Rash] rash - paper tape OK    Current Meds  Medication Sig   acetaminophen  (TYLENOL ) 500 MG tablet Take 500 mg by mouth every 6 (six) hours as needed.   albuterol  (VENTOLIN  HFA) 108 (90 Base) MCG/ACT inhaler Inhale 2 puffs into the lungs every 6 (six) hours as needed for wheezing or shortness of breath. Please schedule office visit before any future refills.   aspirin 81 MG tablet Take 81 mg by mouth daily.   b complex vitamins capsule Take 1 capsule by mouth once a week.   Biotin  5 MG CAPS Take 1 capsule (5 mg total) by mouth daily.   Cholecalciferol  1.25 MG (50000 UT) TABS Take 1 tablet by mouth once a week.   cyclobenzaprine  (FLEXERIL ) 10 MG tablet Take 1 tablet (10 mg total) by mouth 3 (three) times daily as needed for muscle spasms. This can make you sleepy.   metFORMIN  (GLUCOPHAGE ) 500 MG tablet Take 1 tablet (500 mg total) by mouth daily with breakfast.   methylPREDNISolone  (MEDROL  DOSEPAK) 4 MG TBPK tablet Use as directed x 6 days.   Multiple Vitamin (MULTIVITAMIN) tablet Take 1 tablet by mouth daily.   pantoprazole  (PROTONIX ) 40 MG tablet Take 1 tablet (40 mg total) by mouth daily.   RESTASIS 0.05 % ophthalmic emulsion    rosuvastatin  (CRESTOR ) 10 MG tablet Take 1 tablet (10 mg total) by mouth 2 (two) times a week.   tirzepatide  (MOUNJARO) 2.5 MG/0.5ML Pen Inject 2.5 mg into the skin once a week.   [START ON 01/23/2024] tirzepatide  (MOUNJARO) 5 MG/0.5ML Pen Inject 5 mg into the skin once a week.   traZODone  (DESYREL ) 50 MG tablet TAKE 1/2 TO 1  TABLET BY MOUTH AT BEDTIME AS NEEDED FOR SLEEP    Objective: There were no vitals taken for this visit.  Physical Exam:  General: Alert and oriented. and No acute distress. Gait: Normal gait.  Right hand with mild swelling about the A1 pulley  to the long finger.  Tenderness to palpation over the A1 pulley.  Active triggering is noted in clinic today.  Fingers warm well-perfused.  Sensation intact throughout the right hand.  2+ radial pulse.   IMAGING: No new imaging obtained today   New Medications:  No orders of the defined types were placed in this encounter.     Tonita Frater, MD  01/01/2024 12:38 PM

## 2024-01-01 ENCOUNTER — Ambulatory Visit

## 2024-01-01 ENCOUNTER — Encounter: Payer: Self-pay | Admitting: Orthopedic Surgery

## 2024-01-01 DIAGNOSIS — M5459 Other low back pain: Secondary | ICD-10-CM | POA: Diagnosis not present

## 2024-01-01 DIAGNOSIS — M47816 Spondylosis without myelopathy or radiculopathy, lumbar region: Secondary | ICD-10-CM | POA: Diagnosis not present

## 2024-01-01 DIAGNOSIS — M5431 Sciatica, right side: Secondary | ICD-10-CM | POA: Diagnosis not present

## 2024-01-01 DIAGNOSIS — M5416 Radiculopathy, lumbar region: Secondary | ICD-10-CM

## 2024-01-01 DIAGNOSIS — M5126 Other intervertebral disc displacement, lumbar region: Secondary | ICD-10-CM | POA: Diagnosis not present

## 2024-01-01 DIAGNOSIS — M5432 Sciatica, left side: Secondary | ICD-10-CM

## 2024-01-01 DIAGNOSIS — M4316 Spondylolisthesis, lumbar region: Secondary | ICD-10-CM | POA: Diagnosis not present

## 2024-01-01 DIAGNOSIS — M4120 Other idiopathic scoliosis, site unspecified: Secondary | ICD-10-CM | POA: Diagnosis not present

## 2024-01-01 DIAGNOSIS — R262 Difficulty in walking, not elsewhere classified: Secondary | ICD-10-CM

## 2024-01-01 DIAGNOSIS — M65331 Trigger finger, right middle finger: Secondary | ICD-10-CM | POA: Diagnosis not present

## 2024-01-01 NOTE — Therapy (Signed)
 OUTPATIENT PHYSICAL THERAPY TREATMENT   Patient Name: Stacey Nichols MRN: [id number] DOB:[birth year], 63 y.o., female Today's Date: 01/01/2024  END OF SESSION:  PT End of Session - 01/01/24 0733     Visit Number 2    Number of Visits 17    Date for PT Re-Evaluation 02/27/24    PT Start Time 0733    PT Stop Time 0813    PT Time Calculation (min) 40 min    Activity Tolerance Patient tolerated treatment well;Patient limited by pain    Behavior During Therapy [facility] for tasks assessed/performed           Past Medical History:  Diagnosis Date   AML (acute myeloblastic leukemia) (HCC) 2007   s/p chemo (Dr Carlon Chester)   Anemia    hx of   Anxiety    Arthritis    severe   Constipation    COVID-19 virus infection 07/04/2021   Depression    Diabetes mellitus without complication (HCC)    Fatty liver    GERD (gastroesophageal reflux disease)    H/O Bell's palsy    H/O hiatal hernia    Headache    occasional   High triglycerides    History of cancer chemotherapy    Leukemia (HCC)    Morbidly obese (HCC)    Osteopenia 06/08/2017   DEXA 2017 - T -1.8 spine   Pneumonia    hx of    Rheumatoid arthritis (HCC)    Sleep apnea    Stroke (HCC)    mini stroke-caused bells palsy for 1 month   Past Surgical History:  Procedure Laterality Date   ABDOMINAL HYSTERECTOMY  2001   heavy bleeding, fibroids, 1 ovary remains   BLEPHAROPLASTY Bilateral 06/2020   in [place]   COLONOSCOPY WITH PROPOFOL  N/A 09/19/2020   TA, int hem, rpt 7 yrs Ole Berkeley, Darren, MD)   FACIAL COSMETIC SURGERY  06/2020   in [place]   LAPAROSCOPIC GASTRIC BAND REMOVAL WITH LAPAROSCOPIC GASTRIC SLEEVE RESECTION  04/26/2014   LAPAROSCOPIC GASTRIC BANDING  2000   performed in [place]   REPLACEMENT TOTAL KNEE Left 10/2017   Dr Nowell [facility]   REPLACEMENT TOTAL KNEE Right 06/2018   Dr Nowell Begun [facility]   TUBAL LIGATION  1996   UPPER GI ENDOSCOPY N/A 04/26/2014   Procedure: UPPER GI ENDOSCOPY;  Surgeon: Azucena Bollard, MD;  Location: [facility];  Service: General;  Laterality: N/A;   Patient Active Problem List   Diagnosis Date Noted   Abnormal x-ray of lumbar spine 10/25/2023   Abnormal urinalysis 10/25/2023   Generalized body aches 10/25/2023   Insulin  resistance syndrome 10/02/2023   Hyperinsulinemia 10/02/2023   Right wrist pain 10/02/2023   Gastroesophageal reflux disease without esophagitis 10/02/2023   Polyarthralgia 05/22/2023   Ex-smoker 03/12/2023   Osteoarthritis 12/10/2022   Insomnia 01/30/2022   Polyp of ascending colon    Recurrent respiratory infection 07/10/2020   Depression 07/10/2020   History of Bell's palsy 07/10/2020   History of colitis 07/10/2020   History of stroke 07/10/2020   Low serum vitamin B12 07/08/2020   Dysuria 06/28/2020   Dry mouth 06/28/2020   Verruca vulgaris 06/28/2020   Borderline hypothyroidism 04/08/2020   Encounter for general adult medical examination with abnormal findings 03/03/2018   NAFLD (nonalcoholic fatty liver disease) 78/29/5621   Osteopenia 06/08/2017   Trigeminal neuralgia 05/01/2017   Type 2 diabetes mellitus with other specified complication (HCC) 05/27/2016   Primary osteoarthritis of both knees 09/29/2015  Environmental and seasonal allergies 09/29/2015   Arthralgia of hands, bilateral 07/12/2015   Arthralgia of hip 07/12/2015   Degeneration of intervertebral disc of lumbar region with discogenic back pain and lower extremity pain 07/12/2015   Dyslipidemia 06/20/2015   Vitamin D  deficiency 06/20/2015   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   Obesity, morbid, BMI 40.0-49.9 (HCC) 01/10/2014   OSA (obstructive sleep apnea) 01/10/2014   Seasonal allergic rhinitis 09/30/2011   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   History of laparoscopic adjustable gastric banding 2000    PCP: Claire Crick, MD   REFERRING PROVIDER: Lucetta Russel, PA-C  REFERRING DIAG: [phone] (ICD-10-CM) -  Lumbar spondylosis M43.16 (ICD-10-CM) - Spondylolisthesis of lumbar region M41.20 (ICD-10-CM) - Other idiopathic scoliosis, unspecified spinal region M54.16 (ICD-10-CM) - Lumbar radiculopathy M51.26 (ICD-10-CM) - HNP (herniated nucleus pulposus), lumbar  Rationale for Evaluation and Treatment: Rehabilitation  THERAPY DIAG:  Other low back pain  Radiculopathy, lumbar region  Sciatica, left side  Sciatica, right side  Difficulty in walking, not elsewhere classified  ONSET DATE: February 2025  SUBJECTIVE:                                                                                                                                                                                           SUBJECTIVE STATEMENT: Low back: with 8/10 currently. L LE L5 dermatome pain when walking.     PERTINENT HISTORY:  Low back pain. Feels like she does not need an interpreter. Pain started February 2025, got tests, was told that it was due to back bones. Was given medicine which has not helped. Pain has worsened since onset. Pain comes and goes. L LE bothers her (along L L5 dermatome, not past the knee), pt also feels pain at L hip joint. The R side also hurts some times (R L5 dermatome), not past the knee with numbness and pain. Going to get injections next week to see if that helps.   Unknown mechanism of injury, sudden onset L lateral thigh with gradual worsening of pain  Had something similar 4 years ago but the pain went away. This time, the pain has not gone away.   PLOF: was able to ambulate about 1.5 hours and do anything she wanted to.   Interpreter present. [facility])  Interpreter was used during the majority of the session.   (Having interpreter during future sessions is recommended based on the eval)  No blood pressure problems per pt.   LATEX ALLERGIES   PAIN:  Are you having pain? Yes: NPRS scale: 0/10 (pt currently sitting) Pain location: low back, and L >  R LE at L5 dermatome,  not past the knees.  Pain description: sharp, pins and needles, numbness Aggravating factors: sitting down, standing up, laying down, staying still in one position Relieving factors: movement, change positions, walk a little bit.   PRECAUTIONS: Osteopenia  RED FLAGS: Bowel or bladder incontinence: No and Cauda equina syndrome: No   WEIGHT BEARING RESTRICTIONS: No  FALLS:  Has patient fallen in last 6 months? No  LIVING ENVIRONMENT: Lives with: lives alone Lives in: House/apartment Stairs: Yes: Internal: 15 steps; on left going up and External: 6 steps; none Has following equipment at home: Single point cane and walker without wheels  OCCUPATION: Not working.   PLOF: Independent  PATIENT GOALS: walk normally or the best that she could without pain.   NEXT MD VISIT: August 2025  OBJECTIVE:  Note: Objective measures were completed at Evaluation unless otherwise noted.  DIAGNOSTIC FINDINGS:  MR Lumbar Spine Wo Contrast  11/19/2023  Narrative & Impression  CLINICAL DATA:  Lumbar radiculopathy with symptoms persisting over 6 weeks of treatment   EXAM: MRI LUMBAR SPINE WITHOUT CONTRAST   TECHNIQUE: Multiplanar, multisequence MR imaging of the lumbar spine was performed. No intravenous contrast was administered.   COMPARISON:  None Available.   FINDINGS: Segmentation:  Standard.   Alignment:  Trace scoliosis and mild L4-5 anterolisthesis   Vertebrae: No fracture, evidence of discitis, or bone lesion. Mild degenerative superior endplate edema at L4   Conus medullaris and cauda equina: Conus extends to the L1-2 level. Conus and cauda equina appear normal.   Paraspinal and other soft tissues: No perispinal mass or inflammation.   Disc levels:   T12- L1: Disc bulging and right facet spurring.   L1-L2: Circumferential disc bulging.  Negative facets   L2-L3: Disc bulging with narrowing and desiccation. Small central protrusion. Mild degenerative facet spurring.    L3-L4: Disc narrowing and bulging with moderate facet spurring. Degeneration and epidural fat expansion causes moderate thecal sac stenosis, 6 mm in the midline. Left foraminal to extraforaminal herniation contacting the left L3 nerve root.   L4-L5: Disc narrowing and bulging with tiny central protrusion. Degenerative facet spurring with ligamentum flavum thickening greater on the right. Epidural fat and degeneration causes moderate thecal sac stenosis. Patent foramina.   L5-S1:Disc desiccation and narrowing with small central protrusion. There is a right foraminal protrusion up lifting the L5 nerve root. Degenerative facet spurring greater on the right.   IMPRESSION: Generalized lumbar spine degeneration with scoliosis.   Focal foraminal impingement on the right at L5-S1 mainly due to disc protrusion.   On the symptomatic left side there is a L3-4 left far-lateral herniation which could affect the left L3 nerve root.   Degeneration and epidural fat causes moderate thecal sac stenosis at L3-4 and L4-5.     Electronically Signed   By: Ronnette Coke M.D.   On: 12/05/2023 08:11   DG Lumbar Spine Complete 10/24/2023  Narrative & Impression  CLINICAL DATA:  left low back pain with radiculopathy x 6 + wks   EXAM: LUMBAR SPINE - COMPLETE 4+ VIEW   COMPARISON:  X-ray lumbar spine 07/12/2015   FINDINGS: There is no evidence of lumbar spine fracture. Interval worsening of multilevel moderate degenerative changes of the spine. Interval development of dextroscoliosis centered at the L3 level with associated 1.4 cm left to right translocation of the L4 vertebral body relation to the L5 level. Suggestion of likely osseous neural foraminal stenosis. Multilevel moderate intervertebral disc space narrowing, most prominent  at the L5-S1 level.   IMPRESSION: Interval development of dextroscoliosis centered at the L3 level with associated 1.4 cm left to right translocation of the  L4 vertebral body relation to the L5 level. Consider cross-sectional imaging for further evaluation.     Electronically Signed   By: Morgane  Naveau M.D.   On: 10/25/2023 00:19    DG Si Joints 10/24/2023  Narrative & Impression  CLINICAL DATA:  Left SI JT pain.   EXAM: BILATERAL SACROILIAC JOINTS - 3 VIEW   COMPARISON:  None Available.   FINDINGS: There is bilateral hip degenerative change with joint space narrowing and small osteophytes. Pelvic ring is intact. The sacroiliac joint spaces are maintained and there is no evidence of arthropathy. No acute fracture, dislocation or subluxation. No osteolytic or osteoblastic lesions.   IMPRESSION: Bilateral hip degenerative changes. No sacroiliac joint arthropathy identified. No acute osseous abnormalities.     Electronically Signed   By: Sydell Eva M.D.   On: 10/24/2023 21:53           PATIENT SURVEYS:  Modified Oswestry 17/50 (34%), (12/30/2023)   COGNITION: Overall cognitive status: Within functional limits for tasks assessed     SENSATION:   MUSCLE LENGTH:   POSTURE: R weight shift, forward neck, thoracic kyphosis, decreased L LE weight bearing, R lateral shift, increased R lateral shift around L2/3 area.   Increased L LE L5 dermatome pain with manual R lateral shift correction.    PALPATION:   LUMBAR ROM:   AROM eval  Flexion Full, no pain  Extension Limited with reproduction of back and L LE pain   Right lateral flexion full  Left lateral flexion Limited with L lateral thigh pain  Right rotation WFL with L lateral thigh pain  Left rotation WFL with low Back pain   (Blank rows = not tested)  LOWER EXTREMITY ROM:     Passive  Right eval Left eval  Hip flexion    Hip extension    Hip abduction    Hip adduction    Hip internal rotation 33 12 with L posterior and anterior hip joint pain  Hip external rotation 64 60  Knee flexion    Knee extension    Ankle dorsiflexion    Ankle  plantarflexion    Ankle inversion    Ankle eversion     (Blank rows = not tested)  LOWER EXTREMITY MMT:    MMT Right eval Left eval  Hip flexion 4 4- with L lateral posterior thigh pain  Hip extension (seated manually resisted) 3 3  Hip abduction (seated manually resisted) 4 4  Hip adduction    Hip internal rotation    Hip external rotation    Knee flexion 5 4  Knee extension 5 4+  Ankle dorsiflexion 5 5  Ankle plantarflexion    Ankle inversion    Ankle eversion     (Blank rows = not tested)  LUMBAR SPECIAL TESTS:  Long sit test suggests anterior nutation of L innominate.   FUNCTIONAL TESTS:    GAIT: Distance walked: 10 ft Assistive device utilized: Single point cane Level of assistance: Modified independence Comments: decreased stance L LE, antalgic, SPC on R  TREATMENT DATE: 01/01/2024  Interpreter present  Manual therapy  Hooklying manual lumbar traction   Decreased back pain during the traction position   Therapeutic exercise  Supine transversus abdominis contraction 10x3 with 5 second holds  Hooklying SKTC L LE 10x10 seconds   No low back or B LE pain in supine after afroementioned exercises  Supine SKTC L with hip extension isometrics 10x5 seconds with PT  Hooklying posterior pelvic tilt 10x5 seconds for 2 sets  Coccyx symptoms. Eases with rest   Reviewed HEP. Pt demonstrated and verbalized understanding. Handout provided.      Improved exercise technique, movement at target joints, use of target muscles after mod verbal, visual, tactile cues.    PATIENT EDUCATION:  Education details: there-ex, HEP Person educated: Patient Education method: Explanation, Demonstration, Tactile cues, Verbal cues, Handouts, and interpreter Education comprehension: verbalized understanding and returned demonstration  HOME EXERCISE  PROGRAM: Access Code: [email or web address] URL: [email or web address] Date: 01/01/2024 Prepared by: Suzzane Estes  Exercises - Supine Transversus Abdominis Bracing - Hands on Stomach  - 3 x daily - 7 x weekly - 3 sets - 10 reps - 5 seconds hold - Supine Posterior Pelvic Tilt  - 1 x daily - 7 x weekly - 3 sets - 10 reps - 5 seconds hold    ASSESSMENT:  CLINICAL IMPRESSION: Performed manual lumbar traction to decrease pressure to low back followed by core muscle activation and strengthening to improve lumbopelvic posture and mechanics. Decreased low back pain, increased L LE symptoms at end of session reported. Pt will benefit from continued skilled physical therapy services to decrease pain, improve strength and function.       OBJECTIVE IMPAIRMENTS: Abnormal gait, difficulty walking, decreased strength, improper body mechanics, postural dysfunction, and pain.   ACTIVITY LIMITATIONS: carrying, lifting, bending, sitting, standing, squatting, stairs, transfers, bed mobility, reach over head, and locomotion level  PARTICIPATION LIMITATIONS:   PERSONAL FACTORS: Fitness, Past/current experiences, Time since onset of injury/illness/exacerbation, and 3+ comorbidities: AML, anxiety, arthritis, DM, osteopenia, RA are also affecting patient's functional outcome.   REHAB POTENTIAL: Fair    CLINICAL DECISION MAKING: Evolving/moderate complexity, pain is worsening since onset per pt  EVALUATION COMPLEXITY: Moderate   GOALS: Goals reviewed with patient? Yes  SHORT TERM GOALS: Target date: 01/09/2024  Pt will be independent with her initial HEP to decrease back pain, improve strength, and function. Baseline: Pt has not yet started her initial HEP (12/30/2023). Goal status: INITIAL   LONG TERM GOALS: Target date: 02/27/2024  PT will improve her Modified Oswestry score by at least 10% as a demonstration of improved function.  Baseline: Modified Oswestry 17/50 (34%), (12/30/2023)  Goal  status: INITIAL  2.  Pt will have a decrease in low back pain to 5/10 or less at worst to promote ability to ambulate as well as perform standing tasks more comfortably.  Baseline: Low back: 10+/10 at worst for the past 3 months. Pain sometimes hurt in the morning and cannot move (12/30/2023) Goal status: INITIAL  3.  Pt will have a decrease in L LE and R LE pain to 5/10 or less at worst to promote ability to ambulate as well as perform standing tasks more comfortably.  Baseline: L LE: 10+/10 at worst for the past 3 months, R LE: 8/10 at worst for the past 3 months. (12/30/2023) Goal status: INITIAL  4.  Pt will improve B hip strength by at least 1/2 MMT grade to promote ability to perform standing tasks as well as ambulate more comfortably for her back  and B LE. Baseline:  MMT Right eval Left eval  Hip flexion 4 4- with L lateral posterior thigh pain  Hip extension (seated manually resisted) 3 3  Hip abduction (seated manually resisted) 4 4   Goal status: INITIAL   PLAN:  PT FREQUENCY: 1-2x/week  PT DURATION: 8 weeks  PLANNED INTERVENTIONS: [id number]-Therapeutic exercises, 97530- Therapeutic activity, [id number]- Neuromuscular re-education, 97535- Self Care, [id number]- Manual therapy, [id number]- Gait training, [phone]- Aquatic Therapy, [phone]- Electrical stimulation (unattended), [id number]- Traction (mechanical), [id number]- Ionotophoresis 4mg /ml Dexamethasone , Patient/Family education, and Stair training.  PLAN FOR NEXT SESSION: improve posture, trunk and glute strength, manual techniques, modalities PRN   Lesa Vandall, PT, DPT 01/01/2024, 10:17 AM

## 2024-01-02 ENCOUNTER — Other Ambulatory Visit: Payer: Self-pay | Admitting: Family Medicine

## 2024-01-02 DIAGNOSIS — M545 Low back pain, unspecified: Secondary | ICD-10-CM

## 2024-01-02 DIAGNOSIS — G8929 Other chronic pain: Secondary | ICD-10-CM

## 2024-01-05 NOTE — Telephone Encounter (Signed)
 She has Rx for both methocarbamol  and flexeril  - which does she want to continue?

## 2024-01-06 ENCOUNTER — Encounter: Payer: Self-pay | Admitting: Student in an Organized Health Care Education/Training Program

## 2024-01-06 ENCOUNTER — Ambulatory Visit
Attending: Student in an Organized Health Care Education/Training Program | Admitting: Student in an Organized Health Care Education/Training Program

## 2024-01-06 VITALS — BP 121/78 | HR 85 | Temp 98.2°F | Resp 16 | Ht 62.0 in | Wt 242.0 lb

## 2024-01-06 DIAGNOSIS — M5416 Radiculopathy, lumbar region: Secondary | ICD-10-CM | POA: Insufficient documentation

## 2024-01-06 DIAGNOSIS — E66813 Obesity, class 3: Secondary | ICD-10-CM | POA: Diagnosis not present

## 2024-01-06 DIAGNOSIS — Z6841 Body Mass Index (BMI) 40.0 and over, adult: Secondary | ICD-10-CM | POA: Insufficient documentation

## 2024-01-06 DIAGNOSIS — G8929 Other chronic pain: Secondary | ICD-10-CM | POA: Diagnosis not present

## 2024-01-06 DIAGNOSIS — M48061 Spinal stenosis, lumbar region without neurogenic claudication: Secondary | ICD-10-CM | POA: Diagnosis not present

## 2024-01-06 NOTE — Telephone Encounter (Signed)
 Spoke with pt asking about rxs. States she prefers Flexeril  due to Robaxin  causing dry mouth. Fyi to Dr KANDICE.  Denied request.

## 2024-01-06 NOTE — Patient Instructions (Signed)

## 2024-01-06 NOTE — Progress Notes (Signed)
 Safety precautions to be maintained throughout the outpatient stay will include: orient to surroundings, keep bed in low position, maintain call bell within reach at all times, provide assistance with transfer out of bed and ambulation.

## 2024-01-06 NOTE — Progress Notes (Signed)
 PROVIDER NOTE: Interpretation of information contained herein should be left to medically-trained personnel. Specific patient instructions are provided elsewhere under Patient Instructions section of medical record. This document was created in part using AI and STT-dictation technology, any transcriptional errors that may result from this process are unintentional.  Patient: Stacey Nichols Rank  Service: E/M Encounter  Provider: Wallie Sherry, MD  DOB: 02-14-1961  Delivery: Face-to-face  Specialty: Interventional Pain Management  MRN: [id number]  Setting: Ambulatory outpatient facility  Specialty designation: 09  Type: New Patient  Location: Outpatient office facility  PCP: Rilla Baller, MD  DOS: 01/06/2024    Referring Prov.: Hilma Hastings, PA-C   Primary Reason(s) for Visit: Encounter for initial evaluation of one or more chronic problems (new to examiner) potentially causing chronic pain, and posing a threat to normal musculoskeletal function. (Level of risk: High) CC: Back Pain (Low mid), Hip Pain (Bilateral ), and Leg Pain (L>R)  HPI  Stacey Nichols is a 63 y.o. year old, female patient, who comes for the first time to our practice referred by Hilma Hastings, PA-C for our initial evaluation of her chronic pain. 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); Arthralgia of hands, bilateral; Arthralgia of hip; Degeneration of intervertebral disc of lumbar region with discogenic back pain and lower extremity pain; Primary osteoarthritis of both knees; Environmental and seasonal allergies; Type 2 diabetes mellitus with other specified complication (HCC); Trigeminal neuralgia; Osteopenia; Encounter for general adult medical examination with abnormal findings; NAFLD (nonalcoholic fatty liver  disease); Borderline hypothyroidism; Dysuria; Dry mouth; Verruca vulgaris; Low serum vitamin B12; Recurrent respiratory infection; Depression; History of Bell's palsy; History of colitis; Seasonal allergic rhinitis; History of stroke; Polyp of ascending colon; Insomnia; Osteoarthritis; Ex-smoker; Polyarthralgia; Insulin  resistance syndrome; Hyperinsulinemia; Right wrist pain; Gastroesophageal reflux disease without esophagitis; Abnormal x-ray of lumbar spine; Abnormal urinalysis; Generalized body aches; Chronic radicular lumbar pain; and Lumbar foraminal stenosis on their problem list. Today she comes in for evaluation of her Back Pain (Low mid), Hip Pain (Bilateral ), and Leg Pain (L>R)  Pain Assessment: Location: Lower, Medial Back Radiating: Low mid back pain. Left side worse constant pain (sciatica from from mid lower back to left hip to outer later leg to top of left knee). Right side is not as bad, not as constant, to right hip under the right buttocks area. Onset: More than a month ago Duration: Chronic pain Quality: Aching, Sharp, Tiring (electrical) Severity: 8 /10 (subjective, self-reported pain score)  Effect on ADL: Limits ADLS. Unable to walk long periods now. Timing: Constant Modifying factors: Medication, rest, sitting, and using a brace BP: 121/78  HR: 85  Onset and Duration: Date of onset: 08/31/23 and Present longer than 3 months Cause of pain: Unknown Severity: Getting worse, NAS-11 at its worse: 10/10, NAS-11 at its best: 6/10, NAS-11 now: 8/10, and NAS-11 on the average: 8/10 Timing: Not influenced by the time of the day and During activity or exercise Aggravating Factors: Prolonged sitting, Prolonged standing, and Walking Alleviating Factors: Medications, Resting, Sitting, and Using a brace Associated Problems: Day-time cramps, Night-time cramps, Fatigue, and Numbness Quality of Pain: Aching, Numb, Sharp, Tingling, and Tiring Previous Examinations or Tests: MRI scan and  X-rays Previous Treatments: The patient denies previous treatment  Ms. Calles is being evaluated for possible interventional pain management therapies for the treatment of her chronic  pain.  Discussed the use of AI scribe software for clinical note transcription with the patient, who gave verbal consent to proceed.  History of Present Illness   Stacey Nichols is a 63 year old female with degenerative disc disease who presents with low back pain radiating to the left leg. She was referred by neurosurgeons for evaluation of her low back pain.  Her low back pain began approximately four years ago, initially presenting as occasional pain lasting for two weeks, which resolved with walking. In February of this year, the pain recurred suddenly while walking and has progressively worsened, now preventing her from walking. The pain radiates down her left leg and is more severe on the left side compared to the right, where she experiences less intense pain.  An MRI revealed a herniated disc at L3-L4 on the left side and degenerative disc disease with compressed discs at L1-L2, L2-L3, and L3-L4. She has been treated with prednisone , which reduced the pain in her left leg.  She is prediabetic and did not notice any changes in her blood sugar levels while on prednisone . She has had bilateral knee replacements and is concerned about infection risks due to her medical history.  She is currently not on any weight loss medication, although it was prescribed, pending insurance approval. She uses a cane for ambulation at home and is working with physical therapy to manage her condition.       Meds   Current Outpatient Medications:    acetaminophen  (TYLENOL ) 500 MG tablet, Take 500 mg by mouth every 6 (six) hours as needed., Disp: , Rfl:    albuterol  (VENTOLIN  HFA) 108 (90 Base) MCG/ACT inhaler, Inhale 2 puffs into the lungs every 6 (six) hours as needed for wheezing or shortness of breath. Please schedule office  visit before any future refills., Disp: 18 each, Rfl: 0   aspirin 81 MG tablet, Take 81 mg by mouth daily., Disp: , Rfl:    b complex vitamins capsule, Take 1 capsule by mouth once a week., Disp: , Rfl:    Biotin  5 MG CAPS, Take 1 capsule (5 mg total) by mouth daily., Disp: , Rfl:    Cholecalciferol  1.25 MG (50000 UT) TABS, Take 1 tablet by mouth once a week., Disp: 12 tablet, Rfl: 4   cyclobenzaprine  (FLEXERIL ) 10 MG tablet, Take 1 tablet (10 mg total) by mouth 3 (three) times daily as needed for muscle spasms. This can make you sleepy., Disp: 60 tablet, Rfl: 0   metFORMIN  (GLUCOPHAGE ) 500 MG tablet, Take 1 tablet (500 mg total) by mouth daily with breakfast., Disp: 90 tablet, Rfl: 3   methylPREDNISolone  (MEDROL  DOSEPAK) 4 MG TBPK tablet, Use as directed x 6 days., Disp: 1 each, Rfl: 0   Multiple Vitamin (MULTIVITAMIN) tablet, Take 1 tablet by mouth daily., Disp: , Rfl:    pantoprazole  (PROTONIX ) 40 MG tablet, Take 1 tablet (40 mg total) by mouth daily., Disp: 30 tablet, Rfl: 6   RESTASIS 0.05 % ophthalmic emulsion, , Disp: , Rfl:    rosuvastatin  (CRESTOR ) 10 MG tablet, Take 1 tablet (10 mg total) by mouth 2 (two) times a week., Disp: 24 tablet, Rfl: 3   traZODone  (DESYREL ) 50 MG tablet, TAKE 1/2 TO 1 TABLET BY MOUTH AT BEDTIME AS NEEDED FOR SLEEP, Disp: 90 tablet, Rfl: 3   tirzepatide  (MOUNJARO ) 2.5 MG/0.5ML Pen, Inject 2.5 mg into the skin once a week., Disp: 2 mL, Rfl: 0   [START ON 01/23/2024] tirzepatide  (MOUNJARO ) 5 MG/0.5ML Pen,  Inject 5 mg into the skin once a week., Disp: 6 mL, Rfl: 1  Imaging Review  MR Lumbar Spine Wo Contrast  Narrative CLINICAL DATA:  Lumbar radiculopathy with symptoms persisting over 6 weeks of treatment  EXAM: MRI LUMBAR SPINE WITHOUT CONTRAST  TECHNIQUE: Multiplanar, multisequence MR imaging of the lumbar spine was performed. No intravenous contrast was administered.  COMPARISON:  None Available.  FINDINGS: Segmentation:  Standard.  Alignment:   Trace scoliosis and mild L4-5 anterolisthesis  Vertebrae: No fracture, evidence of discitis, or bone lesion. Mild degenerative superior endplate edema at L4  Conus medullaris and cauda equina: Conus extends to the L1-2 level. Conus and cauda equina appear normal.  Paraspinal and other soft tissues: No perispinal mass or inflammation.  Disc levels:  T12- L1: Disc bulging and right facet spurring.  L1-L2: Circumferential disc bulging.  Negative facets  L2-L3: Disc bulging with narrowing and desiccation. Small central protrusion. Mild degenerative facet spurring.  L3-L4: Disc narrowing and bulging with moderate facet spurring. Degeneration and epidural fat expansion causes moderate thecal sac stenosis, 6 mm in the midline. Left foraminal to extraforaminal herniation contacting the left L3 nerve root.  L4-L5: Disc narrowing and bulging with tiny central protrusion. Degenerative facet spurring with ligamentum flavum thickening greater on the right. Epidural fat and degeneration causes moderate thecal sac stenosis. Patent foramina.  L5-S1:Disc desiccation and narrowing with small central protrusion. There is a right foraminal protrusion up lifting the L5 nerve root. Degenerative facet spurring greater on the right.  IMPRESSION: Generalized lumbar spine degeneration with scoliosis.  Focal foraminal impingement on the right at L5-S1 mainly due to disc protrusion.  On the symptomatic left side there is a L3-4 left far-lateral herniation which could affect the left L3 nerve root.  Degeneration and epidural fat causes moderate thecal sac stenosis at L3-4 and L4-5.   Electronically Signed By: Dorn Roulette M.D. On: 12/05/2023 08:11  DG Lumbar Spine Complete  Narrative CLINICAL DATA:  left low back pain with radiculopathy x 6 + wks  EXAM: LUMBAR SPINE - COMPLETE 4+ VIEW  COMPARISON:  X-ray lumbar spine 07/12/2015  FINDINGS: There is no evidence of lumbar spine  fracture. Interval worsening of multilevel moderate degenerative changes of the spine. Interval development of dextroscoliosis centered at the L3 level with associated 1.4 cm left to right translocation of the L4 vertebral body relation to the L5 level. Suggestion of likely osseous neural foraminal stenosis. Multilevel moderate intervertebral disc space narrowing, most prominent at the L5-S1 level.  IMPRESSION: Interval development of dextroscoliosis centered at the L3 level with associated 1.4 cm left to right translocation of the L4 vertebral body relation to the L5 level. Consider cross-sectional imaging for further evaluation.   Electronically Signed By: Morgane  Naveau M.D. On: 10/25/2023 00:19  DG Si Joints  Narrative CLINICAL DATA:  Left SI JT pain.  EXAM: BILATERAL SACROILIAC JOINTS - 3 VIEW  COMPARISON:  None Available.  FINDINGS: There is bilateral hip degenerative change with joint space narrowing and small osteophytes. Pelvic ring is intact. The sacroiliac joint spaces are maintained and there is no evidence of arthropathy. No acute fracture, dislocation or subluxation. No osteolytic or osteoblastic lesions.  IMPRESSION: Bilateral hip degenerative changes. No sacroiliac joint arthropathy identified. No acute osseous abnormalities.   Electronically Signed By: Fonda Field M.D. On: 10/24/2023 21:53   DG Knee 2 Views Right  Narrative CLINICAL DATA:  Chronic bilateral knee pain.  EXAM: RIGHT KNEE - 1-2 VIEW  COMPARISON:  12/20/2013  FINDINGS: Postoperative changes with right total knee arthroplasty including patellar femoral component. Components appear well seated. No evidence of acute fracture or dislocation. No focal bone lesion or bone destruction. Bone cortex appears intact. No significant effusions. Soft tissues are unremarkable.  IMPRESSION: Right total knee arthroplasty. Components appear well seated. No acute bony  abnormalities.   Electronically Signed By: Elsie Gravely M.D. On: 02/13/2022 20:02  Knee-L DG 1-2 views: Results for orders placed during the hospital encounter of 02/13/22  DG Knee 2 Views Left  Narrative CLINICAL DATA:  Chronic bilateral knee pain.  EXAM: LEFT KNEE - 1-2 VIEW  COMPARISON:  12/20/2013  FINDINGS: Left total hip arthroplasty including patellar femoral component. Components appear well seated. No evidence of acute fracture or dislocation. No focal bone lesion or bone destruction. Bone cortex appears intact. No significant effusion. Soft tissues are unremarkable.  IMPRESSION: Left total knee arthroplasty. Components appear well seated. No acute displaced fractures identified.   Electronically Signed By: Elsie Gravely M.D. On: 02/13/2022 20:01   DG Hand Complete Right  Narrative CLINICAL DATA:  Bilateral hand pain, numbness for 6 months.  EXAM: RIGHT HAND - COMPLETE 3+ VIEW  COMPARISON:  07/02/2012  FINDINGS: No acute bony abnormality. Specifically, no fracture, subluxation, or dislocation. Soft tissues are intact. Joint spaces are maintained. Normal bone mineralization.  IMPRESSION: No bony abnormality.   Electronically Signed By: Franky Crease M.D. On: 07/12/2015 15:14    Narrative CLINICAL DATA:  Bilateral hand pain and numbness for 6 months.  EXAM: LEFT HAND - COMPLETE 3+ VIEW  COMPARISON:  07/02/2012  FINDINGS: There is minimal osteoarthritis of the DIP joints of the second and third digits. This has slightly progressed since the prior exam. Metacarpal phalangeal joints are normal. Radiocarpal joints and carpal metacarpal joints are normal.  No periarticular demineralization or subluxation.  IMPRESSION: Minimal osteoarthritis of the DIP joints of the second and third digits, slightly progressed.   Electronically Signed By: Lynwood Hugger M.D. On: 07/12/2015 15:15   Complexity Note: Imaging results reviewed.                          ROS  Cardiovascular: Daily Aspirin intake Pulmonary or Respiratory: Snoring  and Temporary stoppage of breathing during sleep Neurological: Curved spine Psychological-Psychiatric: No reported psychological or psychiatric signs or symptoms such as difficulty sleeping, anxiety, depression, delusions or hallucinations (schizophrenial), mood swings (bipolar disorders) or suicidal ideations or attempts Gastrointestinal: Reflux or heatburn Genitourinary: No reported renal or genitourinary signs or symptoms such as difficulty voiding or producing urine, peeing blood, non-functioning kidney, kidney stones, difficulty emptying the bladder, difficulty controlling the flow of urine, or chronic kidney disease Hematological: No reported hematological signs or symptoms such as prolonged bleeding, low or poor functioning platelets, bruising or bleeding easily, hereditary bleeding problems, low energy levels due to low hemoglobin or being anemic Endocrine: High blood sugar controlled without the use of insulin  (NIDDM) Rheumatologic: Joint aches and or swelling due to excess weight (Osteoarthritis) and Rheumatoid arthritis Musculoskeletal: Negative for myasthenia gravis, muscular dystrophy, multiple sclerosis or malignant hyperthermia Work History: Retired  Allergies  Ms. Gutridge is allergic to latex, sulfa antibiotics, and tape.  Laboratory Chemistry Profile   Renal Lab Results  Component Value Date   BUN 23 09/24/2023   CREATININE 0.92 09/24/2023   BCR 27 (H) 09/04/2017   GFR 67.11 09/24/2023   GFRAA 115 09/04/2017   GFRNONAA >60 02/13/2022   SPECGRAV 1.025 10/24/2023  PHUR 6.0 10/24/2023   PROTEINUR Negative 10/24/2023     Electrolytes Lab Results  Component Value Date   NA 141 09/24/2023   K 4.0 09/24/2023   CL 104 09/24/2023   CALCIUM  9.5 09/24/2023   MG 1.8 03/12/2023     Hepatic Lab Results  Component Value Date   AST 22 10/08/2023   ALT 28 10/08/2023    ALBUMIN 4.0 10/08/2023   ALKPHOS 75 10/08/2023     ID Lab Results  Component Value Date   SARSCOV2NAA NEGATIVE 02/13/2022   HCVAB NEGATIVE 12/25/2016     Bone Lab Results  Component Value Date   VD25OH 30.42 09/24/2023     Endocrine Lab Results  Component Value Date   GLUCOSE 125 11/11/2023   GLUCOSEU NEGATIVE 02/13/2022   HGBA1C 6.1 (A) 12/24/2023   TSH 4.80 09/24/2023   FREET4 0.78 09/24/2023     Neuropathy Lab Results  Component Value Date   VITAMINB12 629 09/24/2023   FOLATE 21.7 09/24/2023   HGBA1C 6.1 (A) 12/24/2023     CNS No results found for: COLORCSF, APPEARCSF, RBCCOUNTCSF, WBCCSF, POLYSCSF, LYMPHSCSF, EOSCSF, PROTEINCSF, GLUCCSF, JCVIRUS, CSFOLI, IGGCSF, LABACHR, ACETBL   Inflammation (CRP: Acute  ESR: Chronic) Lab Results  Component Value Date   CRP <1.0 05/21/2023   ESRSEDRATE 14 05/21/2023   LATICACIDVEN 1.8 02/13/2022     Rheumatology Lab Results  Component Value Date   RF <10 05/21/2023   ANA POSITIVE (A) 10/08/2023   LABURIC 5.1 03/12/2023     Coagulation Lab Results  Component Value Date   INR 1.0 06/30/2018   LABPROT 11.4 06/30/2018   APTT 29.0 10/21/2017   PLT 173.0 09/24/2023   DDIMER 0.74 (H) 01/31/2022     Cardiovascular Lab Results  Component Value Date   CKTOTAL 46 10/08/2023   HGB 14.5 09/24/2023   HCT 43.6 09/24/2023     Screening Lab Results  Component Value Date   SARSCOV2NAA NEGATIVE 02/13/2022   HCVAB NEGATIVE 12/25/2016     Cancer No results found for: CEA, CA125, LABCA2   Allergens No results found for: ALMOND, APPLE, ASPARAGUS, AVOCADO, BANANA, BARLEY, BASIL, BAYLEAF, GREENBEAN, LIMABEAN, WHITEBEAN, BEEFIGE, REDBEET, BLUEBERRY, BROCCOLI, CABBAGE, MELON, CARROT, CASEIN, CASHEWNUT, CAULIFLOWER, CELERY     Note: Lab results reviewed.  PFSH  Drug: Ms. Briggs  reports no history of drug use. Alcohol:  reports current alcohol  use. Tobacco:  reports that she quit smoking about 5 years ago. Her smoking use included cigarettes. She started smoking about 35 years ago. She has never used smokeless tobacco. Medical:  has a past medical history of AML (acute myeloblastic leukemia) (HCC) (2007), Anemia, Anxiety, Arthritis, Constipation, COVID-19 virus infection (07/04/2021), Depression, Diabetes mellitus without complication (HCC), Fatty liver, GERD (gastroesophageal reflux disease), H/O Bell's palsy, H/O hiatal hernia, Headache, High triglycerides, History of cancer chemotherapy, Leukemia (HCC), Morbidly obese (HCC), Osteopenia (06/08/2017), Pneumonia, Rheumatoid arthritis (HCC), Sleep apnea, and Stroke (HCC). Family: family history includes CAD in her father; Cancer in her mother and sister; Diabetes in her maternal grandfather; Heart Problems in her father; Hyperlipidemia in her father; Obesity in her father.  Past Surgical History:  Procedure Laterality Date   ABDOMINAL HYSTERECTOMY  2001   heavy bleeding, fibroids, 1 ovary remains   BLEPHAROPLASTY Bilateral 06/2020   in [place]   COLONOSCOPY WITH PROPOFOL  N/A 09/19/2020   TA, int hem, rpt 7 yrs Renne, Darren, MD)   FACIAL COSMETIC SURGERY  06/2020   in [place]   LAPAROSCOPIC GASTRIC BAND REMOVAL WITH  LAPAROSCOPIC GASTRIC SLEEVE RESECTION  04/26/2014   LAPAROSCOPIC GASTRIC BANDING  2000   performed in [place]   REPLACEMENT TOTAL KNEE Left 10/2017   Dr Darliss [facility]ons [facility]   REPLACEMENT TOTAL KNEE Right 06/2018   Dr Darliss Persons [facility]   TUBAL LIGATION  1996   UPPER GI ENDOSCOPY N/A 04/26/2014   Procedure: UPPER GI ENDOSCOPY;  Surgeon: Donnice KATHEE Lunger, MD;  Location: [facility];  Service: General;  Laterality: N/A;   Active Ambulatory Problems    Diagnosis Date Noted   History of laparoscopic adjustable gastric banding 2000   Obesity, morbid, BMI 40.0-49.9 (HCC) 01/10/2014   OSA (obstructive sleep apnea) 01/10/2014   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  04/26/2014   Dyslipidemia 06/20/2015   Vitamin D  deficiency 06/20/2015   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   Arthralgia of hands, bilateral 07/12/2015   Arthralgia of hip 07/12/2015   Degeneration of intervertebral disc of lumbar region with discogenic back pain and lower extremity pain 07/12/2015   Primary osteoarthritis of both knees 09/29/2015   Environmental and seasonal allergies 09/29/2015   Type 2 diabetes mellitus with other specified complication (HCC) 05/27/2016   Trigeminal neuralgia 05/01/2017   Osteopenia 06/08/2017   Encounter for general adult medical examination with abnormal findings 03/03/2018   NAFLD (nonalcoholic fatty liver disease) 91/79/7980   Borderline hypothyroidism 04/08/2020   Dysuria 06/28/2020   Dry mouth 06/28/2020   Verruca vulgaris 06/28/2020   Low serum vitamin B12 07/08/2020   Recurrent respiratory infection 07/10/2020   Depression 07/10/2020   History of Bell's palsy 07/10/2020   History of colitis 07/10/2020   Seasonal allergic rhinitis 09/30/2011   History of stroke 07/10/2020   Polyp of ascending colon    Insomnia 01/30/2022   Osteoarthritis 12/10/2022   Ex-smoker 03/12/2023   Polyarthralgia 05/22/2023   Insulin  resistance syndrome 10/02/2023   Hyperinsulinemia 10/02/2023   Right wrist pain 10/02/2023   Gastroesophageal reflux disease without esophagitis 10/02/2023   Abnormal x-ray of lumbar spine 10/25/2023   Abnormal urinalysis 10/25/2023   Generalized body aches 10/25/2023   Chronic radicular lumbar pain 01/06/2024   Lumbar foraminal stenosis 01/06/2024   Resolved Ambulatory Problems    Diagnosis Date Noted   Dizziness of unknown cause 06/20/2015   Pressure in right side of chest 06/20/2015   Viral URI 07/12/2015   Chest pain at rest 08/10/2015   Shortness of breath on exertion 01/21/2017   History of abnormal cervical Pap smear 05/01/2017   Class 3 severe obesity with serious comorbidity and body mass index  (BMI) of 40.0 to 44.9 in adult 05/05/2017   Preop examination 10/21/2017   Primary osteoarthritis of right knee 03/03/2018   Acute respiratory infection 04/29/2018   Chest pressure 03/24/2020   Abdominal pain, epigastric 07/10/2020   Abnormal LFTs 07/10/2020   Abnormal respiratory rate 07/10/2020   Acute infective tonsillitis 07/10/2020   Adenopathy 07/10/2020   Anterior cervical lymphadenopathy 12/22/2012   Complication of surgery 07/10/2020   Facial nerve palsy 07/10/2020   Facial numbness 07/10/2020   H/O viral illness 12/07/2010   History of fatty infiltration of liver 07/10/2020   Hypertriglyceridemia 07/10/2020   Leukemia (HCC) 07/10/2020   Morning joint stiffness 07/10/2020   NASH (nonalcoholic steatohepatitis) 07/10/2020   Postoperative state 07/10/2020   Encounter for screening colonoscopy    Right foot pain 01/08/2021   COVID-19 virus infection 07/04/2021   Acute bronchitis 12/10/2022   Acute hip pain, left 10/02/2023   Past Medical  History:  Diagnosis Date   AML (acute myeloblastic leukemia) (HCC) 2007   Anemia    Anxiety    Arthritis    Constipation    Diabetes mellitus without complication (HCC)    Fatty liver    GERD (gastroesophageal reflux disease)    H/O Bell's palsy    H/O hiatal hernia    Headache    High triglycerides    History of cancer chemotherapy    Morbidly obese (HCC)    Pneumonia    Rheumatoid arthritis (HCC)    Sleep apnea    Stroke [facility])    Constitutional Exam  General appearance: Well nourished, well developed, and well hydrated. In no apparent acute distress Vitals:   01/06/24 0813  BP: 121/78  Pulse: 85  Resp: 16  Temp: 98.2 F (36.8 C)  TempSrc: Temporal  SpO2: 99%  Weight: 242 lb (109.8 kg)  Height: 5' 2 (1.575 m)   BMI Assessment: Estimated body mass index is 44.26 kg/m as calculated from the following:   Height as of this encounter: 5' 2 (1.575 m).   Weight as of this encounter: 242 lb (109.8 kg).  BMI  interpretation table: BMI level Category Range association with higher incidence of chronic pain  <18 kg/m2 Underweight   18.5-24.9 kg/m2 Ideal body weight   25-29.9 kg/m2 Overweight Increased incidence by 20%  30-34.9 kg/m2 Obese (Class I) Increased incidence by 68%  35-39.9 kg/m2 Severe obesity (Class II) Increased incidence by 136%  >40 kg/m2 Extreme obesity (Class III) Increased incidence by 254%   Patient's current BMI Ideal Body weight  Body mass index is 44.26 kg/m. Ideal body weight: 50.1 kg (110 lb 7.2 oz) Adjusted ideal body weight: 74 kg (163 lb 1.1 oz)   BMI Readings from Last 4 Encounters:  01/06/24 44.26 kg/m  12/24/23 44.70 kg/m  12/22/23 44.28 kg/m  10/24/23 42.75 kg/m   Wt Readings from Last 4 Encounters:  01/06/24 242 lb (109.8 kg)  12/24/23 248 lb 6 oz (112.7 kg)  12/22/23 246 lb (111.6 kg)  10/24/23 237 lb 8 oz (107.7 kg)    Psych/Mental status: Alert, oriented x 3 (person, place, & time)       Eyes: PERLA Respiratory: No evidence of acute respiratory distress  Lumbar Spine Area Exam  Skin & Axial Inspection: No masses, redness, or swelling Alignment: Symmetrical Functional ROM: Pain restricted ROM affecting primarily the left Stability: No instability detected Muscle Tone/Strength: Functionally intact. No obvious neuro-muscular anomalies detected. Sensory (Neurological): Dermatomal pain pattern Palpation: No palpable anomalies       Provocative Tests: Hyperextension/rotation test: deferred today       Lumbar quadrant test (Kemp's test): (+) on the left for foraminal stenosis Lateral bending test: (+) ipsilateral radicular pain, on the left. Positive for left-sided foraminal stenosis.  Gait & Posture Assessment  Ambulation: Patient ambulates using a cane Gait: Antalgic gait (limping) Posture: Difficulty standing up straight, due to pain  Lower Extremity Exam    Side: Right lower extremity  Side: Left lower extremity  Stability: No instability  observed          Stability: No instability observed          Skin & Extremity Inspection: Skin color, temperature, and hair growth are WNL. No peripheral edema or cyanosis. No masses, redness, swelling, asymmetry, or associated skin lesions. No contractures.  Skin & Extremity Inspection: Skin color, temperature, and hair growth are WNL. No peripheral edema or cyanosis. No masses, redness, swelling, asymmetry, or  associated skin lesions. No contractures.  Functional ROM: Unrestricted ROM                  Functional ROM: Pain restricted ROM for all joints of the lower extremity          Muscle Tone/Strength: Functionally intact. No obvious neuro-muscular anomalies detected.  Muscle Tone/Strength: Functionally intact. No obvious neuro-muscular anomalies detected.  Sensory (Neurological): Unimpaired        Sensory (Neurological): Unimpaired        DTR: Patellar: deferred today Achilles: deferred today Plantar: deferred today  DTR: Patellar: deferred today Achilles: deferred today Plantar: deferred today  Palpation: No palpable anomalies  Palpation: No palpable anomalies    Assessment  Primary Diagnosis & Pertinent Problem List: The primary encounter diagnosis was Lumbar radiculopathy (LEFT L3). Diagnoses of Chronic radicular lumbar pain, Lumbar foraminal stenosis, and Class 3 severe obesity with serious comorbidity and body mass index (BMI) of 40.0 to 44.9 in adult, unspecified obesity type were also pertinent to this visit.  Visit Diagnosis (New problems to examiner): 1. Lumbar radiculopathy (LEFT L3)   2. Chronic radicular lumbar pain   3. Lumbar foraminal stenosis   4. Class 3 severe obesity with serious comorbidity and body mass index (BMI) of 40.0 to 44.9 in adult, unspecified obesity type    Plan of Care (Initial workup plan)   Problem-specific plan: Assessment and Plan    Low back pain with left leg radiculopathy   Chronic low back pain with left leg radiculopathy has worsened  since February. MRI reveals disc herniation at L3-L4 causing nerve compression and severe stenosis, significantly impairing her ability to walk. Informed consent is obtained for a left L3 nerve root injection with steroid. If injections do not relieve pain, other non-surgical options like neuromodulation will be considered before surgery. Administer the left L3 nerve root injection with steroid, using diazepam  for relaxation. Ensure she has a driver post-procedure and advise fasting for six hours before the procedure. Evaluate her response to the injection in two weeks. Continue physical therapy and weight management.  Degenerative disc disease   Degenerative disc disease at L1-L2, L2-L3, and L3-L4 contributes to low back pain. Continue physical therapy and weight management.  Arthritis of the back   Arthritis contributes to low back pain, separate from radiculopathy. Consider MB nerve blocks and ablations for arthritis pain management.  Prediabetes   Prediabetic status is confirmed with no change in blood glucose following recent prednisone  use. GLP-1 medication approval is pending with insurance. Monitor blood glucose levels and discuss GLP-1 medication approval with her primary care provider.        Procedure Orders         Lumbar Transforaminal Epidural      Provider-requested follow-up: Return in about 2 weeks (around 01/20/2024) for Left L3 TF ESI , in clinic (PO Valium  10 mg).  Future Appointments  Date Time Provider Department Center  01/07/2024  7:30 AM Roda Emil SAUNDERS, PT [facility] None  01/09/2024  9:00 AM Roda Emil R, PT [facility] None  01/14/2024  7:30 AM Roda Emil R, PT [facility] None  01/19/2024  7:30 AM Roda Emil R, PT [facility] None  01/21/2024  7:30 AM Roda Emil R, PT [facility] None  01/26/2024  7:30 AM Roda Emil SAUNDERS, PT [facility] None  01/28/2024  7:30 AM Roda Emil SAUNDERS, PT [facility] None  02/02/2024  7:30 AM Roda Emil SAUNDERS, PT [facility] None  02/04/2024  7:30 AM Roda Emil SAUNDERS, PT [facility]  None  02/10/2024  8:15 AM Roda Rubin R, PT [facility] None  02/13/2024  8:15 AM Roda Rubin R, PT [facility] None  02/17/2024  8:15 AM Roda Rubin SAUNDERS, PT [facility] None  02/20/2024  8:15 AM Roda Rubin SAUNDERS, PT [facility] None  02/24/2024  8:15 AM Roda Rubin SAUNDERS, PT [facility] None  02/27/2024  8:15 AM Roda Rubin SAUNDERS, PT [facility] None  03/01/2024  9:30 AM Hilma Hastings, PA-C [facility] None  03/26/2024  9:00 AM Rilla Baller, MD [facility]   I discussed the assessment and treatment plan with the patient. The patient was provided an opportunity to ask questions and all were answered. The patient agreed with the plan and demonstrated an understanding of the instructions.  Patient advised to call back or seek an in-person evaluation if the symptoms or condition worsens.  Duration of encounter: .  Total time on encounter, as per AMA guidelines included both the face-to-face and non-face-to-face time personally spent by the physician and/or other qualified health care professional(s) on the day of the encounter (includes time in activities that require the physician or other qualified health care professional and does not include time in activities normally performed by clinical staff). Physician's time may include the following activities when performed: Preparing to see the patient (e.g., pre-charting review of records, searching for previously ordered imaging, lab work, and nerve conduction tests) Review of prior analgesic pharmacotherapies. Reviewing PMP Interpreting ordered tests (e.g., lab work, imaging, nerve conduction tests) Performing post-procedure evaluations, including interpretation of diagnostic procedures Obtaining and/or reviewing separately obtained history Performing a medically appropriate examination and/or evaluation Counseling and educating the patient/family/caregiver Ordering medications, tests, or procedures Referring and communicating with other  health care professionals (when not separately reported) Documenting clinical information in the electronic or other health record Independently interpreting results (not separately reported) and communicating results to the patient/ family/caregiver Care coordination (not separately reported)  Note by: Wallie Sherry, MD (TTS and AI technology used. I apologize for any typographical errors that were not detected and corrected.) Date: 01/06/2024; Time: 9:48 AM

## 2024-01-07 ENCOUNTER — Other Ambulatory Visit: Payer: Self-pay | Admitting: Orthopedic Surgery

## 2024-01-07 ENCOUNTER — Ambulatory Visit

## 2024-01-07 DIAGNOSIS — M65331 Trigger finger, right middle finger: Secondary | ICD-10-CM

## 2024-01-07 DIAGNOSIS — M4316 Spondylolisthesis, lumbar region: Secondary | ICD-10-CM

## 2024-01-07 DIAGNOSIS — M47816 Spondylosis without myelopathy or radiculopathy, lumbar region: Secondary | ICD-10-CM

## 2024-01-07 DIAGNOSIS — M4120 Other idiopathic scoliosis, site unspecified: Secondary | ICD-10-CM | POA: Diagnosis not present

## 2024-01-07 DIAGNOSIS — M5416 Radiculopathy, lumbar region: Secondary | ICD-10-CM | POA: Diagnosis not present

## 2024-01-07 DIAGNOSIS — M5431 Sciatica, right side: Secondary | ICD-10-CM

## 2024-01-07 DIAGNOSIS — M5459 Other low back pain: Secondary | ICD-10-CM

## 2024-01-07 DIAGNOSIS — M5432 Sciatica, left side: Secondary | ICD-10-CM | POA: Diagnosis not present

## 2024-01-07 DIAGNOSIS — M5126 Other intervertebral disc displacement, lumbar region: Secondary | ICD-10-CM

## 2024-01-07 DIAGNOSIS — R262 Difficulty in walking, not elsewhere classified: Secondary | ICD-10-CM

## 2024-01-07 NOTE — Therapy (Signed)
 OUTPATIENT PHYSICAL THERAPY TREATMENT   Patient Name: Stacey Nichols MRN: [id number] DOB:[birth year], 63 y.o., female Today's Date: 01/07/2024  END OF SESSION:  PT End of Session - 01/07/24 0734     Visit Number 3    Number of Visits 17    Date for PT Re-Evaluation 02/27/24    PT Start Time 0734    PT Stop Time 0815    PT Time Calculation (min) 41 min    Activity Tolerance Patient tolerated treatment well;Patient limited by pain    Behavior During Therapy [facility] for tasks assessed/performed            Past Medical History:  Diagnosis Date   AML (acute myeloblastic leukemia) (HCC) 2007   s/p chemo (Dr Alto)   Anemia    hx of   Anxiety    Arthritis    severe   Constipation    COVID-19 virus infection 07/04/2021   Depression    Diabetes mellitus without complication (HCC)    Fatty liver    GERD (gastroesophageal reflux disease)    H/O Bell's palsy    H/O hiatal hernia    Headache    occasional   High triglycerides    History of cancer chemotherapy    Leukemia (HCC)    Morbidly obese (HCC)    Osteopenia 06/08/2017   DEXA 2017 - T -1.8 spine   Pneumonia    hx of    Rheumatoid arthritis (HCC)    Sleep apnea    Stroke (HCC)    mini stroke-caused bells palsy for 1 month   Past Surgical History:  Procedure Laterality Date   ABDOMINAL HYSTERECTOMY  2001   heavy bleeding, fibroids, 1 ovary remains   BLEPHAROPLASTY Bilateral 06/2020   in [place]   COLONOSCOPY WITH PROPOFOL  N/A 09/19/2020   TA, int hem, rpt 7 yrs Renne, Darren, MD)   FACIAL COSMETIC SURGERY  06/2020   in [place]   LAPAROSCOPIC GASTRIC BAND REMOVAL WITH LAPAROSCOPIC GASTRIC SLEEVE RESECTION  04/26/2014   LAPAROSCOPIC GASTRIC BANDING  2000   performed in [place]   REPLACEMENT TOTAL KNEE Left 10/2017   Dr [facility]   REPLACEMENT TOTAL KNEE Right 06/2018   Dr Darliss Persons [facility]   TUBAL LIGATION  1996   UPPER GI ENDOSCOPY N/A 04/26/2014   Procedure: UPPER GI ENDOSCOPY;  Surgeon:  Donnice KATHEE Lunger, MD;  Location: [facility];  Service: General;  Laterality: N/A;   Patient Active Problem List   Diagnosis Date Noted   Chronic radicular lumbar pain 01/06/2024   Lumbar foraminal stenosis 01/06/2024   Abnormal x-ray of lumbar spine 10/25/2023   Abnormal urinalysis 10/25/2023   Generalized body aches 10/25/2023   Insulin  resistance syndrome 10/02/2023   Hyperinsulinemia 10/02/2023   Right wrist pain 10/02/2023   Gastroesophageal reflux disease without esophagitis 10/02/2023   Polyarthralgia 05/22/2023   Ex-smoker 03/12/2023   Osteoarthritis 12/10/2022   Insomnia 01/30/2022   Polyp of ascending colon    Recurrent respiratory infection 07/10/2020   Depression 07/10/2020   History of Bell's palsy 07/10/2020   History of colitis 07/10/2020   History of stroke 07/10/2020   Low serum vitamin B12 07/08/2020   Dysuria 06/28/2020   Dry mouth 06/28/2020   Verruca vulgaris 06/28/2020   Borderline hypothyroidism 04/08/2020   Encounter for general adult medical examination with abnormal findings 03/03/2018   NAFLD (nonalcoholic fatty liver disease) 91/79/7980   Osteopenia 06/08/2017   Trigeminal neuralgia 05/01/2017   Type 2 diabetes mellitus with  other specified complication (HCC) 05/27/2016   Primary osteoarthritis of both knees 09/29/2015   Environmental and seasonal allergies 09/29/2015   Arthralgia of hands, bilateral 07/12/2015   Arthralgia of hip 07/12/2015   Degeneration of intervertebral disc of lumbar region with discogenic back pain and lower extremity pain 07/12/2015   Dyslipidemia 06/20/2015   Vitamin D  deficiency 06/20/2015   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   Obesity, morbid, BMI 40.0-49.9 (HCC) 01/10/2014   OSA (obstructive sleep apnea) 01/10/2014   Seasonal allergic rhinitis 09/30/2011   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   History of laparoscopic adjustable gastric banding 2000    PCP: Rilla Baller, MD   REFERRING PROVIDER: Hilma Hastings, PA-C  REFERRING DIAG: [phone] (ICD-10-CM) - Lumbar spondylosis M43.16 (ICD-10-CM) - Spondylolisthesis of lumbar region M41.20 (ICD-10-CM) - Other idiopathic scoliosis, unspecified spinal region M54.16 (ICD-10-CM) - Lumbar radiculopathy M51.26 (ICD-10-CM) - HNP (herniated nucleus pulposus), lumbar  Rationale for Evaluation and Treatment: Rehabilitation  THERAPY DIAG:  Other low back pain  Radiculopathy, lumbar region  Sciatica, left side  Sciatica, right side  Difficulty in walking, not elsewhere classified  ONSET DATE: February 2025  SUBJECTIVE:                                                                                                                                                                                           SUBJECTIVE STATEMENT: No pain in back currently but has L LE pain 7/10 currently. Back and legs feel ok when doing the exercises lying down, its when she gets up when back and LE pain bothers her a lot.     PERTINENT HISTORY:  Low back pain. Feels like she does not need an interpreter. Pain started February 2025, got tests, was told that it was due to back bones. Was given medicine which has not helped. Pain has worsened since onset. Pain comes and goes. L LE bothers her (along L L5 dermatome, not past the knee), pt also feels pain at L hip joint. The R side also hurts some times (R L5 dermatome), not past the knee with numbness and pain. Going to get injections next week to see if that helps.   Unknown mechanism of injury, sudden onset L lateral thigh with gradual worsening of pain  Had something similar 4 years ago but the pain went away. This time, the pain has not gone away.   PLOF: was able to ambulate about 1.5 hours and do anything she wanted to.   Interpreter present. [facility])  Interpreter was used during the majority of the session.   (Having interpreter during future  sessions is recommended  based on the eval)  No blood pressure problems per pt.   LATEX ALLERGIES   PAIN:  Are you having pain? Yes: NPRS scale: 0/10 (pt currently sitting) Pain location: low back, and L > R LE at L5 dermatome, not past the knees.  Pain description: sharp, pins and needles, numbness Aggravating factors: sitting down, standing up, laying down, staying still in one position Relieving factors: movement, change positions, walk a little bit.   PRECAUTIONS: Osteopenia  RED FLAGS: Bowel or bladder incontinence: No and Cauda equina syndrome: No   WEIGHT BEARING RESTRICTIONS: No  FALLS:  Has patient fallen in last 6 months? No  LIVING ENVIRONMENT: Lives with: lives alone Lives in: House/apartment Stairs: Yes: Internal: 15 steps; on left going up and External: 6 steps; none Has following equipment at home: Single point cane and walker without wheels  OCCUPATION: Not working.   PLOF: Independent  PATIENT GOALS: walk normally or the best that she could without pain.   NEXT MD VISIT: August 2025  OBJECTIVE:  Note: Objective measures were completed at Evaluation unless otherwise noted.  DIAGNOSTIC FINDINGS:  MR Lumbar Spine Wo Contrast  11/19/2023  Narrative & Impression  CLINICAL DATA:  Lumbar radiculopathy with symptoms persisting over 6 weeks of treatment   EXAM: MRI LUMBAR SPINE WITHOUT CONTRAST   TECHNIQUE: Multiplanar, multisequence MR imaging of the lumbar spine was performed. No intravenous contrast was administered.   COMPARISON:  None Available.   FINDINGS: Segmentation:  Standard.   Alignment:  Trace scoliosis and mild L4-5 anterolisthesis   Vertebrae: No fracture, evidence of discitis, or bone lesion. Mild degenerative superior endplate edema at L4   Conus medullaris and cauda equina: Conus extends to the L1-2 level. Conus and cauda equina appear normal.   Paraspinal and other soft tissues: No perispinal mass or inflammation.   Disc levels:   T12- L1: Disc  bulging and right facet spurring.   L1-L2: Circumferential disc bulging.  Negative facets   L2-L3: Disc bulging with narrowing and desiccation. Small central protrusion. Mild degenerative facet spurring.   L3-L4: Disc narrowing and bulging with moderate facet spurring. Degeneration and epidural fat expansion causes moderate thecal sac stenosis, 6 mm in the midline. Left foraminal to extraforaminal herniation contacting the left L3 nerve root.   L4-L5: Disc narrowing and bulging with tiny central protrusion. Degenerative facet spurring with ligamentum flavum thickening greater on the right. Epidural fat and degeneration causes moderate thecal sac stenosis. Patent foramina.   L5-S1:Disc desiccation and narrowing with small central protrusion. There is a right foraminal protrusion up lifting the L5 nerve root. Degenerative facet spurring greater on the right.   IMPRESSION: Generalized lumbar spine degeneration with scoliosis.   Focal foraminal impingement on the right at L5-S1 mainly due to disc protrusion.   On the symptomatic left side there is a L3-4 left far-lateral herniation which could affect the left L3 nerve root.   Degeneration and epidural fat causes moderate thecal sac stenosis at L3-4 and L4-5.     Electronically Signed   By: Dorn Roulette M.D.   On: 12/05/2023 08:11   DG Lumbar Spine Complete 10/24/2023  Narrative & Impression  CLINICAL DATA:  left low back pain with radiculopathy x 6 + wks   EXAM: LUMBAR SPINE - COMPLETE 4+ VIEW   COMPARISON:  X-ray lumbar spine 07/12/2015   FINDINGS: There is no evidence of lumbar spine fracture. Interval worsening of multilevel moderate degenerative changes of the spine. Interval development  of dextroscoliosis centered at the L3 level with associated 1.4 cm left to right translocation of the L4 vertebral body relation to the L5 level. Suggestion of likely osseous neural foraminal stenosis. Multilevel moderate  intervertebral disc space narrowing, most prominent at the L5-S1 level.   IMPRESSION: Interval development of dextroscoliosis centered at the L3 level with associated 1.4 cm left to right translocation of the L4 vertebral body relation to the L5 level. Consider cross-sectional imaging for further evaluation.     Electronically Signed   By: Morgane  Naveau M.D.   On: 10/25/2023 00:19    DG Si Joints 10/24/2023  Narrative & Impression  CLINICAL DATA:  Left SI JT pain.   EXAM: BILATERAL SACROILIAC JOINTS - 3 VIEW   COMPARISON:  None Available.   FINDINGS: There is bilateral hip degenerative change with joint space narrowing and small osteophytes. Pelvic ring is intact. The sacroiliac joint spaces are maintained and there is no evidence of arthropathy. No acute fracture, dislocation or subluxation. No osteolytic or osteoblastic lesions.   IMPRESSION: Bilateral hip degenerative changes. No sacroiliac joint arthropathy identified. No acute osseous abnormalities.     Electronically Signed   By: Fonda Field M.D.   On: 10/24/2023 21:53           PATIENT SURVEYS:  Modified Oswestry 17/50 (34%), (12/30/2023)   COGNITION: Overall cognitive status: Within functional limits for tasks assessed     SENSATION:   MUSCLE LENGTH:   POSTURE: R weight shift, forward neck, thoracic kyphosis, decreased L LE weight bearing, R lateral shift, increased R lateral shift around L2/3 area.   Increased L LE L5 dermatome pain with manual R lateral shift correction.    PALPATION:   LUMBAR ROM:   AROM eval  Flexion Full, no pain  Extension Limited with reproduction of back and L LE pain   Right lateral flexion full  Left lateral flexion Limited with L lateral thigh pain  Right rotation WFL with L lateral thigh pain  Left rotation WFL with low Back pain   (Blank rows = not tested)  LOWER EXTREMITY ROM:     Passive  Right eval Left eval  Hip flexion    Hip extension     Hip abduction    Hip adduction    Hip internal rotation 33 12 with L posterior and anterior hip joint pain  Hip external rotation 64 60  Knee flexion    Knee extension    Ankle dorsiflexion    Ankle plantarflexion    Ankle inversion    Ankle eversion     (Blank rows = not tested)  LOWER EXTREMITY MMT:    MMT Right eval Left eval  Hip flexion 4 4- with L lateral posterior thigh pain  Hip extension (seated manually resisted) 3 3  Hip abduction (seated manually resisted) 4 4  Hip adduction    Hip internal rotation    Hip external rotation    Knee flexion 5 4  Knee extension 5 4+  Ankle dorsiflexion 5 5  Ankle plantarflexion    Ankle inversion    Ankle eversion     (Blank rows = not tested)  LUMBAR SPECIAL TESTS:  Long sit test suggests anterior nutation of L innominate.   FUNCTIONAL TESTS:    GAIT: Distance walked: 10 ft Assistive device utilized: Single point cane Level of assistance: Modified independence Comments: decreased stance L LE, antalgic, SPC on R  TREATMENT DATE: 01/07/2024  Interpreter present  Manual therapy   Hooklying manual lumbar traction   Feels good per pt.   Therapeutic exercise  Supine transversus abdominis contraction 10x with 5 second holds  Supine hip extension isometric, leg straight  L 10x5 seconds for 2 sets   Then on hooklying with L LE straight 10x5 seconds    R 10x5 seconds fir 3 sets   Hooklying transversus abdominis contraction with B shoulder extension isometrics, hands on table 7x5 seconds then 3x5 seconds  difficult  Seated press-ups 4x, then 3x  To help decrease lumbar pressure.     Improved exercise technique, movement at target joints, use of target muscles after mod verbal, visual, tactile cues.    PATIENT EDUCATION:  Education details: there-ex, HEP Person educated:  Patient Education method: Explanation, Demonstration, Tactile cues, Verbal cues, Handouts, and interpreter Education comprehension: verbalized understanding and returned demonstration  HOME EXERCISE PROGRAM: Access Code: [email or web address] URL: [email or web address] Date: 01/01/2024 Prepared by: Emil Glassman  Exercises - Supine Transversus Abdominis Bracing - Hands on Stomach  - 3 x daily - 7 x weekly - 3 sets - 10 reps - 5 seconds hold - Supine Posterior Pelvic Tilt  - 1 x daily - 7 x weekly - 3 sets - 10 reps - 5 seconds hold    ASSESSMENT:  CLINICAL IMPRESSION:  Worked on trunk and glute max strengthening as well as performed manual lumbar traction in supine to help decrease stress to affected areas. Minimal to no lumbar and B LE pain reported in supine. Pain however increased to about 8-9/10 for low back and L LE in standing after session. Fair tolerance to today's session. Pt is limited by pain.  Pt will benefit from continued skilled physical therapy services to overall decrease pain, improve strength and function.       OBJECTIVE IMPAIRMENTS: Abnormal gait, difficulty walking, decreased strength, improper body mechanics, postural dysfunction, and pain.   ACTIVITY LIMITATIONS: carrying, lifting, bending, sitting, standing, squatting, stairs, transfers, bed mobility, reach over head, and locomotion level  PARTICIPATION LIMITATIONS:   PERSONAL FACTORS: Fitness, Past/current experiences, Time since onset of injury/illness/exacerbation, and 3+ comorbidities: AML, anxiety, arthritis, DM, osteopenia, RA are also affecting patient's functional outcome.   REHAB POTENTIAL: Fair    CLINICAL DECISION MAKING: Evolving/moderate complexity, pain is worsening since onset per pt  EVALUATION COMPLEXITY: Moderate   GOALS: Goals reviewed with patient? Yes  SHORT TERM GOALS: Target date: 01/09/2024  Pt will be independent with her initial HEP to decrease back pain, improve strength,  and function. Baseline: Pt has not yet started her initial HEP (12/30/2023). Goal status: INITIAL   LONG TERM GOALS: Target date: 02/27/2024  PT will improve her Modified Oswestry score by at least 10% as a demonstration of improved function.  Baseline: Modified Oswestry 17/50 (34%), (12/30/2023)  Goal status: INITIAL  2.  Pt will have a decrease in low back pain to 5/10 or less at worst to promote ability to ambulate as well as perform standing tasks more comfortably.  Baseline: Low back: 10+/10 at worst for the past 3 months. Pain sometimes hurt in the morning and cannot move (12/30/2023) Goal status: INITIAL  3.  Pt will have a decrease in L LE and R LE pain to 5/10 or less at worst to promote ability to ambulate as well as perform standing tasks more comfortably.  Baseline: L LE: 10+/10 at worst for the past 3 months, R LE: 8/10 at worst for the past 3 months. (12/30/2023) Goal status: INITIAL  4.  Pt will improve B hip strength by at least 1/2 MMT grade to promote ability to perform standing tasks as well as ambulate more comfortably for her back and B LE. Baseline:  MMT Right eval Left eval  Hip flexion 4 4- with L lateral posterior thigh pain  Hip extension (seated manually resisted) 3 3  Hip abduction (seated manually resisted) 4 4   Goal status: INITIAL   PLAN:  PT FREQUENCY: 1-2x/week  PT DURATION: 8 weeks  PLANNED INTERVENTIONS: 97110-Therapeutic exercises, 97530- Therapeutic activity, [id number]- Neuromuscular re-education, 97535- Self Care, [id number]- Manual therapy, [id number]- Gait training, [phone]- Aquatic Therapy, [phone]- Electrical stimulation (unattended), [id number]- Traction (mechanical), [id number]- Ionotophoresis 4mg /ml Dexamethasone , Patient/Family education, and Stair training.  PLAN FOR NEXT SESSION: improve posture, trunk and glute strength, manual techniques, modalities PRN   Annye Forrey, PT, DPT 01/07/2024, 8:39 AM

## 2024-01-09 ENCOUNTER — Ambulatory Visit

## 2024-01-09 DIAGNOSIS — M5459 Other low back pain: Secondary | ICD-10-CM | POA: Diagnosis not present

## 2024-01-09 DIAGNOSIS — M5416 Radiculopathy, lumbar region: Secondary | ICD-10-CM

## 2024-01-09 DIAGNOSIS — M65331 Trigger finger, right middle finger: Secondary | ICD-10-CM | POA: Diagnosis not present

## 2024-01-09 DIAGNOSIS — M4120 Other idiopathic scoliosis, site unspecified: Secondary | ICD-10-CM | POA: Diagnosis not present

## 2024-01-09 DIAGNOSIS — M47816 Spondylosis without myelopathy or radiculopathy, lumbar region: Secondary | ICD-10-CM | POA: Diagnosis not present

## 2024-01-09 DIAGNOSIS — M5432 Sciatica, left side: Secondary | ICD-10-CM | POA: Diagnosis not present

## 2024-01-09 DIAGNOSIS — R262 Difficulty in walking, not elsewhere classified: Secondary | ICD-10-CM

## 2024-01-09 DIAGNOSIS — M5431 Sciatica, right side: Secondary | ICD-10-CM

## 2024-01-09 DIAGNOSIS — M4316 Spondylolisthesis, lumbar region: Secondary | ICD-10-CM | POA: Diagnosis not present

## 2024-01-09 DIAGNOSIS — M5126 Other intervertebral disc displacement, lumbar region: Secondary | ICD-10-CM | POA: Diagnosis not present

## 2024-01-09 NOTE — Therapy (Signed)
 OUTPATIENT PHYSICAL THERAPY TREATMENT   Patient Name: TAILEY TOP MRN: [id number] DOB:[birth year], 63 y.o., female Today's Date: 01/09/2024  END OF SESSION:  PT End of Session - 01/09/24 0905     Visit Number 4    Number of Visits 17    Date for PT Re-Evaluation 02/27/24    PT Start Time 0905    PT Stop Time 0944    PT Time Calculation (min) 39 min    Activity Tolerance Patient tolerated treatment well    Behavior During Therapy [facility] for tasks assessed/performed             Past Medical History:  Diagnosis Date   AML (acute myeloblastic leukemia) (HCC) 2007   s/p chemo (Dr Alto)   Anemia    hx of   Anxiety    Arthritis    severe   Constipation    COVID-19 virus infection 07/04/2021   Depression    Diabetes mellitus without complication (HCC)    Fatty liver    GERD (gastroesophageal reflux disease)    H/O Bell's palsy    H/O hiatal hernia    Headache    occasional   High triglycerides    History of cancer chemotherapy    Leukemia (HCC)    Morbidly obese (HCC)    Osteopenia 06/08/2017   DEXA 2017 - T -1.8 spine   Pneumonia    hx of    Rheumatoid arthritis (HCC)    Sleep apnea    Stroke (HCC)    mini stroke-caused bells palsy for 1 month   Past Surgical History:  Procedure Laterality Date   ABDOMINAL HYSTERECTOMY  2001   heavy bleeding, fibroids, 1 ovary remains   BLEPHAROPLASTY Bilateral 06/2020   in [place]   COLONOSCOPY WITH PROPOFOL  N/A 09/19/2020   TA, int hem, rpt 7 yrs Renne, Darren, MD)   FACIAL COSMETIC SURGERY  06/2020   in [place]   LAPAROSCOPIC GASTRIC BAND REMOVAL WITH LAPAROSCOPIC GASTRIC SLEEVE RESECTION  04/26/2014   LAPAROSCOPIC GASTRIC BANDING  2000   performed in [place]   REPLACEMENT TOTAL KNEE Left 10/2017   Dr D[facility]   REPLACEMENT TOTAL KNEE Right 06/2018   Dr Darliss Persons [facility]   TUBAL LIGATION  1996   UPPER GI ENDOSCOPY N/A 04/26/2014   Procedure: UPPER GI ENDOSCOPY;  Surgeon: Donnice KATHEE Lunger, MD;   Location: [facility];  Service: General;  Laterality: N/A;   Patient Active Problem List   Diagnosis Date Noted   Chronic radicular lumbar pain 01/06/2024   Lumbar foraminal stenosis 01/06/2024   Abnormal x-ray of lumbar spine 10/25/2023   Abnormal urinalysis 10/25/2023   Generalized body aches 10/25/2023   Insulin  resistance syndrome 10/02/2023   Hyperinsulinemia 10/02/2023   Right wrist pain 10/02/2023   Gastroesophageal reflux disease without esophagitis 10/02/2023   Polyarthralgia 05/22/2023   Ex-smoker 03/12/2023   Osteoarthritis 12/10/2022   Insomnia 01/30/2022   Polyp of ascending colon    Recurrent respiratory infection 07/10/2020   Depression 07/10/2020   History of Bell's palsy 07/10/2020   History of colitis 07/10/2020   History of stroke 07/10/2020   Low serum vitamin B12 07/08/2020   Dysuria 06/28/2020   Dry mouth 06/28/2020   Verruca vulgaris 06/28/2020   Borderline hypothyroidism 04/08/2020   Encounter for general adult medical examination with abnormal findings 03/03/2018   NAFLD (nonalcoholic fatty liver disease) 91/79/7980   Osteopenia 06/08/2017   Trigeminal neuralgia 05/01/2017   Type 2 diabetes mellitus with other specified  complication (HCC) 05/27/2016   Primary osteoarthritis of both knees 09/29/2015   Environmental and seasonal allergies 09/29/2015   Arthralgia of hands, bilateral 07/12/2015   Arthralgia of hip 07/12/2015   Degeneration of intervertebral disc of lumbar region with discogenic back pain and lower extremity pain 07/12/2015   Dyslipidemia 06/20/2015   Vitamin D  deficiency 06/20/2015   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   Obesity, morbid, BMI 40.0-49.9 (HCC) 01/10/2014   OSA (obstructive sleep apnea) 01/10/2014   Seasonal allergic rhinitis 09/30/2011   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   History of laparoscopic adjustable gastric banding 2000    PCP: Rilla Baller, MD   REFERRING  PROVIDER: Hilma Hastings, PA-C  REFERRING DIAG: [phone] (ICD-10-CM) - Lumbar spondylosis M43.16 (ICD-10-CM) - Spondylolisthesis of lumbar region M41.20 (ICD-10-CM) - Other idiopathic scoliosis, unspecified spinal region M54.16 (ICD-10-CM) - Lumbar radiculopathy M51.26 (ICD-10-CM) - HNP (herniated nucleus pulposus), lumbar  Rationale for Evaluation and Treatment: Rehabilitation  THERAPY DIAG:  Other low back pain  Radiculopathy, lumbar region  Sciatica, left side  Sciatica, right side  Difficulty in walking, not elsewhere classified  ONSET DATE: February 2025  SUBJECTIVE:                                                                                                                                                                                           SUBJECTIVE STATEMENT: Pt states that 90% of the time she understands. Fine to do PT without the interpreter. Back is better. The problem is the L LE. 4/10 low back pain currently. The pain comes and goes. 6/10 L LE pain currently (posterior hip and thigh along the sciatic nerve). Feels L LE pain when she places weight on her L LE. 3/10 in sitting, 6/10 when walking.   Unable to make it to PT next week.     PERTINENT HISTORY:  Low back pain. Feels like she does not need an interpreter. Pain started February 2025, got tests, was told that it was due to back bones. Was given medicine which has not helped. Pain has worsened since onset. Pain comes and goes. L LE bothers her (along L L5 dermatome, not past the knee), pt also feels pain at L hip joint. The R side also hurts some times (R L5 dermatome), not past the knee with numbness and pain. Going to get injections next week to see if that helps.   Unknown mechanism of injury, sudden onset L lateral thigh with gradual worsening of pain  Had something similar 4 years ago but the pain went away. This time, the pain has not gone away.  PLOF: was able to ambulate about 1.5 hours and do  anything she wanted to.   Interpreter present. [facility])  Interpreter was used during the majority of the session.   (Having interpreter during future sessions is recommended based on the eval)  No blood pressure problems per pt.   LATEX ALLERGIES   PAIN:  Are you having pain? Yes: NPRS scale: 0/10 (pt currently sitting) Pain location: low back, and L > R LE at L5 dermatome, not past the knees.  Pain description: sharp, pins and needles, numbness Aggravating factors: sitting down, standing up, laying down, staying still in one position Relieving factors: movement, change positions, walk a little bit.   PRECAUTIONS: Osteopenia  RED FLAGS: Bowel or bladder incontinence: No and Cauda equina syndrome: No   WEIGHT BEARING RESTRICTIONS: No  FALLS:  Has patient fallen in last 6 months? No  LIVING ENVIRONMENT: Lives with: lives alone Lives in: House/apartment Stairs: Yes: Internal: 15 steps; on left going up and External: 6 steps; none Has following equipment at home: Single point cane and walker without wheels  OCCUPATION: Not working.   PLOF: Independent  PATIENT GOALS: walk normally or the best that she could without pain.   NEXT MD VISIT: August 2025  OBJECTIVE:  Note: Objective measures were completed at Evaluation unless otherwise noted.  DIAGNOSTIC FINDINGS:  MR Lumbar Spine Wo Contrast  11/19/2023  Narrative & Impression  CLINICAL DATA:  Lumbar radiculopathy with symptoms persisting over 6 weeks of treatment   EXAM: MRI LUMBAR SPINE WITHOUT CONTRAST   TECHNIQUE: Multiplanar, multisequence MR imaging of the lumbar spine was performed. No intravenous contrast was administered.   COMPARISON:  None Available.   FINDINGS: Segmentation:  Standard.   Alignment:  Trace scoliosis and mild L4-5 anterolisthesis   Vertebrae: No fracture, evidence of discitis, or bone lesion. Mild degenerative superior endplate edema at L4   Conus medullaris and cauda equina:  Conus extends to the L1-2 level. Conus and cauda equina appear normal.   Paraspinal and other soft tissues: No perispinal mass or inflammation.   Disc levels:   T12- L1: Disc bulging and right facet spurring.   L1-L2: Circumferential disc bulging.  Negative facets   L2-L3: Disc bulging with narrowing and desiccation. Small central protrusion. Mild degenerative facet spurring.   L3-L4: Disc narrowing and bulging with moderate facet spurring. Degeneration and epidural fat expansion causes moderate thecal sac stenosis, 6 mm in the midline. Left foraminal to extraforaminal herniation contacting the left L3 nerve root.   L4-L5: Disc narrowing and bulging with tiny central protrusion. Degenerative facet spurring with ligamentum flavum thickening greater on the right. Epidural fat and degeneration causes moderate thecal sac stenosis. Patent foramina.   L5-S1:Disc desiccation and narrowing with small central protrusion. There is a right foraminal protrusion up lifting the L5 nerve root. Degenerative facet spurring greater on the right.   IMPRESSION: Generalized lumbar spine degeneration with scoliosis.   Focal foraminal impingement on the right at L5-S1 mainly due to disc protrusion.   On the symptomatic left side there is a L3-4 left far-lateral herniation which could affect the left L3 nerve root.   Degeneration and epidural fat causes moderate thecal sac stenosis at L3-4 and L4-5.     Electronically Signed   By: Dorn Roulette M.D.   On: 12/05/2023 08:11   DG Lumbar Spine Complete 10/24/2023  Narrative & Impression  CLINICAL DATA:  left low back pain with radiculopathy x 6 + wks   EXAM: LUMBAR SPINE -  COMPLETE 4+ VIEW   COMPARISON:  X-ray lumbar spine 07/12/2015   FINDINGS: There is no evidence of lumbar spine fracture. Interval worsening of multilevel moderate degenerative changes of the spine. Interval development of dextroscoliosis centered at the L3 level  with associated 1.4 cm left to right translocation of the L4 vertebral body relation to the L5 level. Suggestion of likely osseous neural foraminal stenosis. Multilevel moderate intervertebral disc space narrowing, most prominent at the L5-S1 level.   IMPRESSION: Interval development of dextroscoliosis centered at the L3 level with associated 1.4 cm left to right translocation of the L4 vertebral body relation to the L5 level. Consider cross-sectional imaging for further evaluation.     Electronically Signed   By: Morgane  Naveau M.D.   On: 10/25/2023 00:19    DG Si Joints 10/24/2023  Narrative & Impression  CLINICAL DATA:  Left SI JT pain.   EXAM: BILATERAL SACROILIAC JOINTS - 3 VIEW   COMPARISON:  None Available.   FINDINGS: There is bilateral hip degenerative change with joint space narrowing and small osteophytes. Pelvic ring is intact. The sacroiliac joint spaces are maintained and there is no evidence of arthropathy. No acute fracture, dislocation or subluxation. No osteolytic or osteoblastic lesions.   IMPRESSION: Bilateral hip degenerative changes. No sacroiliac joint arthropathy identified. No acute osseous abnormalities.     Electronically Signed   By: Fonda Field M.D.   On: 10/24/2023 21:53           PATIENT SURVEYS:  Modified Oswestry 17/50 (34%), (12/30/2023)   COGNITION: Overall cognitive status: Within functional limits for tasks assessed     SENSATION:   MUSCLE LENGTH:   POSTURE: R weight shift, forward neck, thoracic kyphosis, decreased L LE weight bearing, R lateral shift, increased R lateral shift around L2/3 area.   Increased L LE L5 dermatome pain with manual R lateral shift correction.    PALPATION:   LUMBAR ROM:   AROM eval  Flexion Full, no pain  Extension Limited with reproduction of back and L LE pain   Right lateral flexion full  Left lateral flexion Limited with L lateral thigh pain  Right rotation WFL with L  lateral thigh pain  Left rotation WFL with low Back pain   (Blank rows = not tested)  LOWER EXTREMITY ROM:     Passive  Right eval Left eval  Hip flexion    Hip extension    Hip abduction    Hip adduction    Hip internal rotation 33 12 with L posterior and anterior hip joint pain  Hip external rotation 64 60  Knee flexion    Knee extension    Ankle dorsiflexion    Ankle plantarflexion    Ankle inversion    Ankle eversion     (Blank rows = not tested)  LOWER EXTREMITY MMT:    MMT Right eval Left eval  Hip flexion 4 4- with L lateral posterior thigh pain  Hip extension (seated manually resisted) 3 3  Hip abduction (seated manually resisted) 4 4  Hip adduction    Hip internal rotation    Hip external rotation    Knee flexion 5 4  Knee extension 5 4+  Ankle dorsiflexion 5 5  Ankle plantarflexion    Ankle inversion    Ankle eversion     (Blank rows = not tested)  LUMBAR SPECIAL TESTS:  Long sit test suggests anterior nutation of L innominate.   FUNCTIONAL TESTS:    GAIT: Distance walked:  10 ft Assistive device utilized: Single point cane Level of assistance: Modified independence Comments: decreased stance L LE, antalgic, SPC on R  TREATMENT DATE: 01/09/2024                                                                                                                                 Neuromuscular re education  Gait: L lumbar lateral trunk side bend during L LE stance phase   Standing bend over onto elevated mat table  Hip abduction   L 10x3   Hip extension    L 10x3  Sitting with upright posture  Gentle manually resisted L lateral shift isometrics in neutral to decrease L lumbar convexity    10x5 seconds for 3 sets   R to L pressure with strap to R lumbar convexity 10x5 seconds    Then with transversus abdominis contraction 10x5 seconds for 2 sets     Decreased L LE pain reported after treatment    Improved exercise technique, movement at  target joints, use of target muscles after mod verbal, visual, tactile cues.    PATIENT EDUCATION:  Education details: there-ex, HEP Person educated: Patient Education method: Explanation, Demonstration, Tactile cues, Verbal cues, Handouts, and interpreter Education comprehension: verbalized understanding and returned demonstration  HOME EXERCISE PROGRAM: Access Code: [email or web address] URL: [email or web address] Date: 01/01/2024 Prepared by: Emil Glassman  Exercises - Supine Transversus Abdominis Bracing - Hands on Stomach  - 3 x daily - 7 x weekly - 3 sets - 10 reps - 5 seconds hold - Supine Posterior Pelvic Tilt  - 1 x daily - 7 x weekly - 3 sets - 10 reps - 5 seconds hold  - Standing Hip Abduction with Counter Support  - 1 x daily - 7 x weekly - 3 sets - 10 reps - Standing Hip Extension with Counter Support  - 1 x daily - 7 x weekly - 3 sets - 10 reps  ASSESSMENT:  CLINICAL IMPRESSION: Improving low back pain based on subjective reports. Worked on L glute med and max strengthening to decrease L trunk side bend compensation for trendelenburg gait. Also worked on improving lumbar posture and core strengthening to help decrease stress to low back and L LE nerves. Decreased L LE pain reported after session.   Pt will benefit from continued skilled physical therapy services to overall decrease pain, improve strength and function.       OBJECTIVE IMPAIRMENTS: Abnormal gait, difficulty walking, decreased strength, improper body mechanics, postural dysfunction, and pain.   ACTIVITY LIMITATIONS: carrying, lifting, bending, sitting, standing, squatting, stairs, transfers, bed mobility, reach over head, and locomotion level  PARTICIPATION LIMITATIONS:   PERSONAL FACTORS: Fitness, Past/current experiences, Time since onset of injury/illness/exacerbation, and 3+ comorbidities: AML, anxiety, arthritis, DM, osteopenia, RA are also affecting patient's functional outcome.   REHAB  POTENTIAL: Fair    CLINICAL DECISION MAKING: Evolving/moderate complexity, pain is worsening since onset per pt  EVALUATION COMPLEXITY: Moderate  GOALS: Goals reviewed with patient? Yes  SHORT TERM GOALS: Target date: 01/09/2024  Pt will be independent with her initial HEP to decrease back pain, improve strength, and function. Baseline: Pt has not yet started her initial HEP (12/30/2023). Goal status: INITIAL   LONG TERM GOALS: Target date: 02/27/2024  PT will improve her Modified Oswestry score by at least 10% as a demonstration of improved function.  Baseline: Modified Oswestry 17/50 (34%), (12/30/2023)  Goal status: INITIAL  2.  Pt will have a decrease in low back pain to 5/10 or less at worst to promote ability to ambulate as well as perform standing tasks more comfortably.  Baseline: Low back: 10+/10 at worst for the past 3 months. Pain sometimes hurt in the morning and cannot move (12/30/2023) Goal status: INITIAL  3.  Pt will have a decrease in L LE and R LE pain to 5/10 or less at worst to promote ability to ambulate as well as perform standing tasks more comfortably.  Baseline: L LE: 10+/10 at worst for the past 3 months, R LE: 8/10 at worst for the past 3 months. (12/30/2023) Goal status: INITIAL  4.  Pt will improve B hip strength by at least 1/2 MMT grade to promote ability to perform standing tasks as well as ambulate more comfortably for her back and B LE. Baseline:  MMT Right eval Left eval  Hip flexion 4 4- with L lateral posterior thigh pain  Hip extension (seated manually resisted) 3 3  Hip abduction (seated manually resisted) 4 4   Goal status: INITIAL   PLAN:  PT FREQUENCY: 1-2x/week  PT DURATION: 8 weeks  PLANNED INTERVENTIONS: 97110-Therapeutic exercises, 97530- Therapeutic activity, [id number]- Neuromuscular re-education, 97535- Self Care, [id number]- Manual therapy, [id number]- Gait training, [phone]- Aquatic Therapy, [phone]- Electrical stimulation (unattended),  [id number]- Traction (mechanical), [id number]- Ionotophoresis 4mg /ml Dexamethasone , Patient/Family education, and Stair training.  PLAN FOR NEXT SESSION: improve posture, trunk and glute strength, manual techniques, modalities PRN   Nishita Isaacks, PT, DPT 01/09/2024, 9:46 AM

## 2024-01-09 NOTE — Telephone Encounter (Signed)
 See MRI referral order notes.   This has been handled within the referral. Precert was completed for [facility]   MRI complete

## 2024-01-14 ENCOUNTER — Ambulatory Visit

## 2024-01-19 ENCOUNTER — Ambulatory Visit: Attending: Orthopedic Surgery

## 2024-01-19 DIAGNOSIS — R262 Difficulty in walking, not elsewhere classified: Secondary | ICD-10-CM | POA: Diagnosis present

## 2024-01-19 DIAGNOSIS — M5459 Other low back pain: Secondary | ICD-10-CM | POA: Diagnosis present

## 2024-01-19 DIAGNOSIS — M5416 Radiculopathy, lumbar region: Secondary | ICD-10-CM | POA: Insufficient documentation

## 2024-01-19 DIAGNOSIS — M5431 Sciatica, right side: Secondary | ICD-10-CM | POA: Insufficient documentation

## 2024-01-19 DIAGNOSIS — M5432 Sciatica, left side: Secondary | ICD-10-CM | POA: Insufficient documentation

## 2024-01-19 NOTE — Therapy (Signed)
 OUTPATIENT PHYSICAL THERAPY TREATMENT   Patient Name: Stacey Nichols MRN: [id number] DOB:Aug 07, 1960, 63 y.o., female Today's Date: 01/19/2024  END OF SESSION:  PT End of Session - 01/19/24 0734     Visit Number 5    Number of Visits 17    Date for PT Re-Evaluation 02/27/24    PT Start Time 0734    PT Stop Time 0817    PT Time Calculation (min) 43 min    Activity Tolerance Patient tolerated treatment well    Behavior During Therapy [facility] for tasks assessed/performed              Past Medical History:  Diagnosis Date   AML (acute myeloblastic leukemia) (HCC) 2007   s/p chemo (Dr Alto)   Anemia    hx of   Anxiety    Arthritis    severe   Constipation    COVID-19 virus infection 07/04/2021   Depression    Diabetes mellitus without complication (HCC)    Fatty liver    GERD (gastroesophageal reflux disease)    H/O Bell's palsy    H/O hiatal hernia    Headache    occasional   High triglycerides    History of cancer chemotherapy    Leukemia (HCC)    Morbidly obese (HCC)    Osteopenia 06/08/2017   DEXA 2017 - T -1.8 spine   Pneumonia    hx of    Rheumatoid arthritis (HCC)    Sleep apnea    Stroke (HCC)    mini stroke-caused bells palsy for 1 month   Past Surgical History:  Procedure Laterality Date   ABDOMINAL HYSTERECTOMY  2001   heavy bleeding, fibroids, 1 ovary remains   BLEPHAROPLASTY Bilateral 06/2020   in [place]   COLONOSCOPY WITH PROPOFOL  N/A 09/19/2020   TA, int hem, rpt 7 yrs Renne, Darren, MD)   FACIAL COSMETIC SURGERY  06/2020   in [place]   LAPAROSCOPIC GASTRIC BAND REMOVAL WITH LAPAROSCOPIC GASTRIC SLEEVE RESECTION  04/26/2014   LAPAROSCOPIC GASTRIC BANDING  2000   performed in [place]   REPLACEMENT TOTAL KNEE Left 10/2017   Dr Darliss [facility]ons [facility]   REPLACEMENT TOTAL KNEE Right 06/2018   Dr Darliss Persons [facility]   TUBAL LIGATION  1996   UPPER GI ENDOSCOPY N/A 04/26/2014   Procedure: UPPER GI ENDOSCOPY;  Surgeon: Donnice KATHEE Lunger, MD;   Location: [facility];  Service: General;  Laterality: N/A;   Patient Active Problem List   Diagnosis Date Noted   Chronic radicular lumbar pain 01/06/2024   Lumbar foraminal stenosis 01/06/2024   Abnormal x-ray of lumbar spine 10/25/2023   Abnormal urinalysis 10/25/2023   Generalized body aches 10/25/2023   Insulin  resistance syndrome 10/02/2023   Hyperinsulinemia 10/02/2023   Right wrist pain 10/02/2023   Gastroesophageal reflux disease without esophagitis 10/02/2023   Polyarthralgia 05/22/2023   Ex-smoker 03/12/2023   Osteoarthritis 12/10/2022   Insomnia 01/30/2022   Polyp of ascending colon    Recurrent respiratory infection 07/10/2020   Depression 07/10/2020   History of Bell's palsy 07/10/2020   History of colitis 07/10/2020   History of stroke 07/10/2020   Low serum vitamin B12 07/08/2020   Dysuria 06/28/2020   Dry mouth 06/28/2020   Verruca vulgaris 06/28/2020   Borderline hypothyroidism 04/08/2020   Encounter for general adult medical examination with abnormal findings 03/03/2018   NAFLD (nonalcoholic fatty liver disease) 91/79/7980   Osteopenia 06/08/2017   Trigeminal neuralgia 05/01/2017   Type 2 diabetes mellitus with other  specified complication (HCC) 05/27/2016   Primary osteoarthritis of both knees 09/29/2015   Environmental and seasonal allergies 09/29/2015   Arthralgia of hands, bilateral 07/12/2015   Arthralgia of hip 07/12/2015   Degeneration of intervertebral disc of lumbar region with discogenic back pain and lower extremity pain 07/12/2015   Dyslipidemia 06/20/2015   Vitamin D  deficiency 06/20/2015   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   Obesity, morbid, BMI 40.0-49.9 (HCC) 01/10/2014   OSA (obstructive sleep apnea) 01/10/2014   Seasonal allergic rhinitis 09/30/2011   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   History of laparoscopic adjustable gastric banding 2000    PCP: Rilla Baller, MD   REFERRING  PROVIDER: Hilma Hastings, PA-C  REFERRING DIAG: [phone] (ICD-10-CM) - Lumbar spondylosis M43.16 (ICD-10-CM) - Spondylolisthesis of lumbar region M41.20 (ICD-10-CM) - Other idiopathic scoliosis, unspecified spinal region M54.16 (ICD-10-CM) - Lumbar radiculopathy M51.26 (ICD-10-CM) - HNP (herniated nucleus pulposus), lumbar  Rationale for Evaluation and Treatment: Rehabilitation  THERAPY DIAG:  Other low back pain  Radiculopathy, lumbar region  Sciatica, left side  Sciatica, right side  Difficulty in walking, not elsewhere classified  ONSET DATE: February 2025  SUBJECTIVE:                                                                                                                                                                                           SUBJECTIVE STATEMENT: The legs today are fine. The back is about an 8/10 for pain currently. Has more movement coming from her legs but her back is what bothers her. No L LE pain currently, pain is mostly in her back and L hip when walking.   Interpreter present      PERTINENT HISTORY:  Low back pain. Feels like she does not need an interpreter. Pain started February 2025, got tests, was told that it was due to back bones. Was given medicine which has not helped. Pain has worsened since onset. Pain comes and goes. L LE bothers her (along L L5 dermatome, not past the knee), pt also feels pain at L hip joint. The R side also hurts some times (R L5 dermatome), not past the knee with numbness and pain. Going to get injections next week to see if that helps.   Unknown mechanism of injury, sudden onset L lateral thigh with gradual worsening of pain  Had something similar 4 years ago but the pain went away. This time, the pain has not gone away.   PLOF: was able to ambulate about 1.5 hours and do anything she wanted to.   Interpreter present. [facility])  Interpreter was used during  the majority of the session.   (Having interpreter  during future sessions is recommended based on the eval)  No blood pressure problems per pt.   LATEX ALLERGIES   PAIN:  Are you having pain? Yes: NPRS scale: 0/10 (pt currently sitting) Pain location: low back, and L > R LE at L5 dermatome, not past the knees.  Pain description: sharp, pins and needles, numbness Aggravating factors: sitting down, standing up, laying down, staying still in one position Relieving factors: movement, change positions, walk a little bit.   PRECAUTIONS: Osteopenia  RED FLAGS: Bowel or bladder incontinence: No and Cauda equina syndrome: No   WEIGHT BEARING RESTRICTIONS: No  FALLS:  Has patient fallen in last 6 months? No  LIVING ENVIRONMENT: Lives with: lives alone Lives in: House/apartment Stairs: Yes: Internal: 15 steps; on left going up and External: 6 steps; none Has following equipment at home: Single point cane and walker without wheels  OCCUPATION: Not working.   PLOF: Independent  PATIENT GOALS: walk normally or the best that she could without pain.   NEXT MD VISIT: August 2025  OBJECTIVE:  Note: Objective measures were completed at Evaluation unless otherwise noted.  DIAGNOSTIC FINDINGS:  MR Lumbar Spine Wo Contrast  11/19/2023  Narrative & Impression  CLINICAL DATA:  Lumbar radiculopathy with symptoms persisting over 6 weeks of treatment   EXAM: MRI LUMBAR SPINE WITHOUT CONTRAST   TECHNIQUE: Multiplanar, multisequence MR imaging of the lumbar spine was performed. No intravenous contrast was administered.   COMPARISON:  None Available.   FINDINGS: Segmentation:  Standard.   Alignment:  Trace scoliosis and mild L4-5 anterolisthesis   Vertebrae: No fracture, evidence of discitis, or bone lesion. Mild degenerative superior endplate edema at L4   Conus medullaris and cauda equina: Conus extends to the L1-2 level. Conus and cauda equina appear normal.   Paraspinal and other soft tissues: No perispinal mass  or inflammation.   Disc levels:   T12- L1: Disc bulging and right facet spurring.   L1-L2: Circumferential disc bulging.  Negative facets   L2-L3: Disc bulging with narrowing and desiccation. Small central protrusion. Mild degenerative facet spurring.   L3-L4: Disc narrowing and bulging with moderate facet spurring. Degeneration and epidural fat expansion causes moderate thecal sac stenosis, 6 mm in the midline. Left foraminal to extraforaminal herniation contacting the left L3 nerve root.   L4-L5: Disc narrowing and bulging with tiny central protrusion. Degenerative facet spurring with ligamentum flavum thickening greater on the right. Epidural fat and degeneration causes moderate thecal sac stenosis. Patent foramina.   L5-S1:Disc desiccation and narrowing with small central protrusion. There is a right foraminal protrusion up lifting the L5 nerve root. Degenerative facet spurring greater on the right.   IMPRESSION: Generalized lumbar spine degeneration with scoliosis.   Focal foraminal impingement on the right at L5-S1 mainly due to disc protrusion.   On the symptomatic left side there is a L3-4 left far-lateral herniation which could affect the left L3 nerve root.   Degeneration and epidural fat causes moderate thecal sac stenosis at L3-4 and L4-5.     Electronically Signed   By: Dorn Roulette M.D.   On: 12/05/2023 08:11   DG Lumbar Spine Complete 10/24/2023  Narrative & Impression  CLINICAL DATA:  left low back pain with radiculopathy x 6 + wks   EXAM: LUMBAR SPINE - COMPLETE 4+ VIEW   COMPARISON:  X-ray lumbar spine 07/12/2015   FINDINGS: There is no evidence of lumbar spine fracture. Interval  worsening of multilevel moderate degenerative changes of the spine. Interval development of dextroscoliosis centered at the L3 level with associated 1.4 cm left to right translocation of the L4 vertebral body relation to the L5 level. Suggestion of likely osseous  neural foraminal stenosis. Multilevel moderate intervertebral disc space narrowing, most prominent at the L5-S1 level.   IMPRESSION: Interval development of dextroscoliosis centered at the L3 level with associated 1.4 cm left to right translocation of the L4 vertebral body relation to the L5 level. Consider cross-sectional imaging for further evaluation.     Electronically Signed   By: Morgane  Naveau M.D.   On: 10/25/2023 00:19    DG Si Joints 10/24/2023  Narrative & Impression  CLINICAL DATA:  Left SI JT pain.   EXAM: BILATERAL SACROILIAC JOINTS - 3 VIEW   COMPARISON:  None Available.   FINDINGS: There is bilateral hip degenerative change with joint space narrowing and small osteophytes. Pelvic ring is intact. The sacroiliac joint spaces are maintained and there is no evidence of arthropathy. No acute fracture, dislocation or subluxation. No osteolytic or osteoblastic lesions.   IMPRESSION: Bilateral hip degenerative changes. No sacroiliac joint arthropathy identified. No acute osseous abnormalities.     Electronically Signed   By: Fonda Field M.D.   On: 10/24/2023 21:53           PATIENT SURVEYS:  Modified Oswestry 17/50 (34%), (12/30/2023)   COGNITION: Overall cognitive status: Within functional limits for tasks assessed     SENSATION:   MUSCLE LENGTH:   POSTURE: R weight shift, forward neck, thoracic kyphosis, decreased L LE weight bearing, R lateral shift, increased R lateral shift around L2/3 area.   Increased L LE L5 dermatome pain with manual R lateral shift correction.    PALPATION:   LUMBAR ROM:   AROM eval  Flexion Full, no pain  Extension Limited with reproduction of back and L LE pain   Right lateral flexion full  Left lateral flexion Limited with L lateral thigh pain  Right rotation WFL with L lateral thigh pain  Left rotation WFL with low Back pain   (Blank rows = not tested)  LOWER EXTREMITY ROM:     Passive   Right eval Left eval  Hip flexion    Hip extension    Hip abduction    Hip adduction    Hip internal rotation 33 12 with L posterior and anterior hip joint pain  Hip external rotation 64 60  Knee flexion    Knee extension    Ankle dorsiflexion    Ankle plantarflexion    Ankle inversion    Ankle eversion     (Blank rows = not tested)  LOWER EXTREMITY MMT:    MMT Right eval Left eval  Hip flexion 4 4- with L lateral posterior thigh pain  Hip extension (seated manually resisted) 3 3  Hip abduction (seated manually resisted) 4 4  Hip adduction    Hip internal rotation    Hip external rotation    Knee flexion 5 4  Knee extension 5 4+  Ankle dorsiflexion 5 5  Ankle plantarflexion    Ankle inversion    Ankle eversion     (Blank rows = not tested)  LUMBAR SPECIAL TESTS:  Long sit test suggests anterior nutation of L innominate.   FUNCTIONAL TESTS:    GAIT: Distance walked: 10 ft Assistive device utilized: Single point cane Level of assistance: Modified independence Comments: decreased stance L LE, antalgic, SPC on R  TREATMENT DATE: 01/19/2024                                                                                                                               Manual therapy   Supine manual lumbar traction   The traction helps with her back per pt   Neuromuscular re education  Hooklying transversus abdominis contraction 10x10 seconds   Standing bend over onto elevated mat table  Hip abduction   L 10x3   Hip extension    L 10x3    Cued to decrease R lateral shift.   Sitting with upright posture  Gentle manually resisted L lateral shift isometrics in neutral to decrease L lumbar convexity    10x5 seconds for 3 sets   L to R pressure with strap to L thoraco lumbar convexity 10x5 seconds    Then with transversus abdominis contraction 10x5 seconds    Decreased low back and L posterior hip pain during standign and gait to 2/10, no L LE pain after  treatment.   Improved exercise technique, movement at target joints, use of target muscles after mod verbal, visual, tactile cues.    PATIENT EDUCATION:  Education details: there-ex, HEP Person educated: Patient Education method: Explanation, Demonstration, Tactile cues, Verbal cues, Handouts, and interpreter Education comprehension: verbalized understanding and returned demonstration  HOME EXERCISE PROGRAM: Access Code: [email or web address] URL: [email or web address] Date: 01/01/2024 Prepared by: Emil Glassman  Exercises - Supine Transversus Abdominis Bracing - Hands on Stomach  - 3 x daily - 7 x weekly - 3 sets - 10 reps - 5 seconds hold - Supine Posterior Pelvic Tilt  - 1 x daily - 7 x weekly - 3 sets - 10 reps - 5 seconds hold  - Standing Hip Abduction with Counter Support  - 1 x daily - 7 x weekly - 3 sets - 10 reps - Standing Hip Extension with Counter Support  - 1 x daily - 7 x weekly - 3 sets - 10 reps  ASSESSMENT:  CLINICAL IMPRESSION: Decreasing/centralizing L LE symptoms to low back and L posterior hip based on subjective reports. Continued working on improving posture, trunk strength as well as L glute med and max strengthening to decrease L trunk side bend compensation for trendelenburg gait. Decreased low back and L LE pain reported after session.   Pt will benefit from continued skilled physical therapy services to overall decrease pain, improve strength and function.       OBJECTIVE IMPAIRMENTS: Abnormal gait, difficulty walking, decreased strength, improper body mechanics, postural dysfunction, and pain.   ACTIVITY LIMITATIONS: carrying, lifting, bending, sitting, standing, squatting, stairs, transfers, bed mobility, reach over head, and locomotion level  PARTICIPATION LIMITATIONS:   PERSONAL FACTORS: Fitness, Past/current experiences, Time since onset of injury/illness/exacerbation, and 3+ comorbidities: AML, anxiety, arthritis, DM, osteopenia, RA are also  affecting patient's functional outcome.   REHAB POTENTIAL: Fair    CLINICAL DECISION MAKING: Evolving/moderate complexity, pain is worsening since onset  per pt  EVALUATION COMPLEXITY: Moderate   GOALS: Goals reviewed with patient? Yes  SHORT TERM GOALS: Target date: 01/09/2024  Pt will be independent with her initial HEP to decrease back pain, improve strength, and function. Baseline: Pt has not yet started her initial HEP (12/30/2023). Goal status: INITIAL   LONG TERM GOALS: Target date: 02/27/2024  PT will improve her Modified Oswestry score by at least 10% as a demonstration of improved function.  Baseline: Modified Oswestry 17/50 (34%), (12/30/2023)  Goal status: INITIAL  2.  Pt will have a decrease in low back pain to 5/10 or less at worst to promote ability to ambulate as well as perform standing tasks more comfortably.  Baseline: Low back: 10+/10 at worst for the past 3 months. Pain sometimes hurt in the morning and cannot move (12/30/2023) Goal status: INITIAL  3.  Pt will have a decrease in L LE and R LE pain to 5/10 or less at worst to promote ability to ambulate as well as perform standing tasks more comfortably.  Baseline: L LE: 10+/10 at worst for the past 3 months, R LE: 8/10 at worst for the past 3 months. (12/30/2023) Goal status: INITIAL  4.  Pt will improve B hip strength by at least 1/2 MMT grade to promote ability to perform standing tasks as well as ambulate more comfortably for her back and B LE. Baseline:  MMT Right eval Left eval  Hip flexion 4 4- with L lateral posterior thigh pain  Hip extension (seated manually resisted) 3 3  Hip abduction (seated manually resisted) 4 4   Goal status: INITIAL   PLAN:  PT FREQUENCY: 1-2x/week  PT DURATION: 8 weeks  PLANNED INTERVENTIONS: 97110-Therapeutic exercises, 97530- Therapeutic activity, [id number]- Neuromuscular re-education, 97535- Self Care, [id number]- Manual therapy, [id number]- Gait training, [phone]- Aquatic  Therapy, [phone]- Electrical stimulation (unattended), [id number]- Traction (mechanical), [id number]- Ionotophoresis 4mg /ml Dexamethasone , Patient/Family education, and Stair training.  PLAN FOR NEXT SESSION: improve posture, trunk and glute strength, manual techniques, modalities PRN   Janete Quilling, PT, DPT 01/19/2024, 4:10 PM

## 2024-01-21 ENCOUNTER — Ambulatory Visit (HOSPITAL_BASED_OUTPATIENT_CLINIC_OR_DEPARTMENT_OTHER): Admitting: Student in an Organized Health Care Education/Training Program

## 2024-01-21 ENCOUNTER — Ambulatory Visit

## 2024-01-21 ENCOUNTER — Ambulatory Visit
Admission: RE | Admit: 2024-01-21 | Discharge: 2024-01-21 | Disposition: A | Discharge status: Home / Self Care | Source: Ambulatory Visit | Attending: Student in an Organized Health Care Education/Training Program | Admitting: Student in an Organized Health Care Education/Training Program

## 2024-01-21 ENCOUNTER — Encounter: Payer: Self-pay | Admitting: Student in an Organized Health Care Education/Training Program

## 2024-01-21 DIAGNOSIS — M5432 Sciatica, left side: Secondary | ICD-10-CM

## 2024-01-21 DIAGNOSIS — M5431 Sciatica, right side: Secondary | ICD-10-CM

## 2024-01-21 DIAGNOSIS — M5459 Other low back pain: Secondary | ICD-10-CM | POA: Diagnosis not present

## 2024-01-21 DIAGNOSIS — G8929 Other chronic pain: Secondary | ICD-10-CM

## 2024-01-21 DIAGNOSIS — M5416 Radiculopathy, lumbar region: Secondary | ICD-10-CM

## 2024-01-21 DIAGNOSIS — M48061 Spinal stenosis, lumbar region without neurogenic claudication: Secondary | ICD-10-CM

## 2024-01-21 MED ORDER — SODIUM CHLORIDE 0.9% FLUSH
2.0000 mL | Freq: Once | INTRAVENOUS | Status: AC
  Administered 2024-01-21: 2 mL

## 2024-01-21 MED ORDER — LIDOCAINE HCL (PF) 2 % IJ SOLN
INTRAMUSCULAR | Status: AC
  Filled 2024-01-21: qty 5

## 2024-01-21 MED ORDER — DIAZEPAM 5 MG PO TABS
ORAL_TABLET | ORAL | Status: AC
  Filled 2024-01-21: qty 2

## 2024-01-21 MED ORDER — ROPIVACAINE HCL 2 MG/ML IJ SOLN
2.0000 mL | Freq: Once | INTRAMUSCULAR | Status: AC
  Administered 2024-01-21: 2 mL via EPIDURAL

## 2024-01-21 MED ORDER — LIDOCAINE HCL 2 % IJ SOLN
20.0000 mL | Freq: Once | INTRAMUSCULAR | Status: AC
  Administered 2024-01-21: 200 mg

## 2024-01-21 MED ORDER — DIAZEPAM 5 MG PO TABS
10.0000 mg | ORAL_TABLET | ORAL | Status: AC
  Administered 2024-01-21: 10 mg via ORAL

## 2024-01-21 MED ORDER — ROPIVACAINE HCL 2 MG/ML IJ SOLN
INTRAMUSCULAR | Status: AC
  Filled 2024-01-21: qty 20

## 2024-01-21 MED ORDER — DEXAMETHASONE SODIUM PHOSPHATE 10 MG/ML IJ SOLN
INTRAMUSCULAR | Status: AC
  Filled 2024-01-21: qty 1

## 2024-01-21 MED ORDER — DEXAMETHASONE SODIUM PHOSPHATE 10 MG/ML IJ SOLN
10.0000 mg | Freq: Once | INTRAMUSCULAR | Status: AC
  Administered 2024-01-21: 10 mg

## 2024-01-21 MED ORDER — IOHEXOL 180 MG/ML  SOLN
10.0000 mL | Freq: Once | INTRAMUSCULAR | Status: AC
  Administered 2024-01-21: 10 mL via EPIDURAL

## 2024-01-21 MED ORDER — SODIUM CHLORIDE (PF) 0.9 % IJ SOLN
INTRAMUSCULAR | Status: AC
  Filled 2024-01-21: qty 10

## 2024-01-21 MED ORDER — IOHEXOL 180 MG/ML  SOLN
INTRAMUSCULAR | Status: AC
  Filled 2024-01-21: qty 20

## 2024-01-21 NOTE — Progress Notes (Signed)
 Safety precautions to be maintained throughout the outpatient stay will include: orient to surroundings, keep bed in low position, maintain call bell within reach at all times, provide assistance with transfer out of bed and ambulation.

## 2024-01-21 NOTE — Progress Notes (Signed)
 PROVIDER NOTE: Interpretation of information contained herein should be left to medically-trained personnel. Specific patient instructions are provided elsewhere under Patient Instructions section of medical record. This document was created in part using STT-dictation technology, any transcriptional errors that may result from this process are unintentional.  Patient: Stacey Nichols Type: Established DOB: 07-Feb-1961 MRN: [id number] PCP: Rilla Baller, MD  Service: Procedure DOS: 01/21/2024 Setting: Ambulatory Location: Ambulatory outpatient facility Delivery: Face-to-face Provider: Wallie Sherry, MD Specialty: Interventional Pain Management Specialty designation: 09 Location: Outpatient facility Ref. Prov.: Sherry Wallie, MD       Interventional Therapy   Procedure: Lumbar trans-foraminal epidural steroid injection (L-TFESI) #1  Laterality: Left (-LT)  Level: L3 nerve root(s) Imaging: Fluoroscopy-guided         Anesthesia: Local anesthesia (1-2% Lidocaine ) Sedation: Minimal Sedation                       DOS: 01/21/2024  Performed by: Wallie Sherry, MD  Purpose: Diagnostic/Therapeutic Indications: Lumbar radicular pain severe enough to impact quality of life or function. 1. Lumbar radiculopathy (LEFT L3)   2. Chronic radicular lumbar pain   3. Lumbar foraminal stenosis    NAS-11 Pain score:   Pre-procedure: 7 /10   Post-procedure: 7 /10     Position / Prep / Materials:  Position: Prone  Prep solution: ChloraPrep (2% chlorhexidine  gluconate and 70% isopropyl alcohol) Prep Area: Entire Posterior Lumbosacral Area.  From the lower tip of the scapula down to the tailbone and from flank to flank. Materials:  Tray: Block Needle(s):  Type: Spinal  Gauge (G): 22  Length: 5-in  Qty: 1     H&P (Pre-op Assessment):  Stacey Nichols is a 63 y.o. (year old), female patient, seen today for interventional treatment. 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 Stacey Nichols has a current medication list which includes the following prescription(s): acetaminophen , albuterol , aspirin, b complex vitamins, biotin , cholecalciferol , cyclobenzaprine , metformin , methylprednisolone , multivitamin, pantoprazole , restasis, rosuvastatin , tirzepatide , [START ON 01/23/2024] tirzepatide , and trazodone . Her primarily concern today is the Back Pain  Initial Vital Signs:  Pulse/HCG Rate: 81ECG Heart Rate: 92 Temp: 98.4 F (36.9 C) Resp: 16 BP: 132/81 SpO2: 95 %  BMI: Estimated body mass index is 44.26 kg/m as calculated from the following:   Height as of this encounter: 5' 2 (1.575 m).   Weight as of this encounter: 242 lb (109.8 kg).  Risk Assessment: Allergies: Reviewed. She is allergic to latex, sulfa antibiotics, and tape.  Allergy Precautions: None required Coagulopathies: Reviewed. None identified.  Blood-thinner therapy: None at this time Active Infection(s): Reviewed. None identified. Stacey Nichols is afebrile  Site Confirmation: Stacey Nichols was asked to confirm the procedure and laterality before marking the site Procedure checklist: Completed Consent: Before the procedure and under the influence of no sedative(s), amnesic(s), or anxiolytics, the patient was informed of the treatment options, risks and possible complications. To fulfill our ethical and legal obligations, as recommended by the American Medical Association's Code of Ethics, I have informed the patient of my clinical impression; the nature and purpose of the treatment or procedure; the risks, benefits, and  possible complications of the intervention; the alternatives, including doing nothing; the  risk(s) and benefit(s) of the alternative treatment(s) or procedure(s); and the risk(s) and benefit(s) of doing nothing. The patient was provided information about the general risks and possible complications associated with the procedure. These may include, but are not limited to: failure to achieve desired goals, infection, bleeding, organ or nerve damage, allergic reactions, paralysis, and death. In addition, the patient was informed of those risks and complications associated to Spine-related procedures, such as failure to decrease pain; infection (i.e.: Meningitis, epidural or intraspinal abscess); bleeding (i.e.: epidural hematoma, subarachnoid hemorrhage, or any other type of intraspinal or peri-dural bleeding); organ or nerve damage (i.e.: Any type of peripheral nerve, nerve root, or spinal cord injury) with subsequent damage to sensory, motor, and/or autonomic systems, resulting in permanent pain, numbness, and/or weakness of one or several areas of the body; allergic reactions; (i.e.: anaphylactic reaction); and/or death. Furthermore, the patient was informed of those risks and complications associated with the medications. These include, but are not limited to: allergic reactions (i.e.: anaphylactic or anaphylactoid reaction(s)); adrenal axis suppression; blood sugar elevation that in diabetics may result in ketoacidosis or comma; water retention that in patients with history of congestive heart failure may result in shortness of breath, pulmonary edema, and decompensation with resultant heart failure; weight gain; swelling or edema; medication-induced neural toxicity; particulate matter embolism and blood vessel occlusion with resultant organ, and/or nervous system infarction; and/or aseptic necrosis of one or more joints. Finally, the patient was informed that Medicine is not an exact science; therefore, there  is also the possibility of unforeseen or unpredictable risks and/or possible complications that may result in a catastrophic outcome. The patient indicated having understood very clearly. We have given the patient no guarantees and we have made no promises. Enough time was given to the patient to ask questions, all of which were answered to the patient's satisfaction. Ms. Cottman has indicated that she wanted to continue with the procedure. Attestation: I, the ordering provider, attest that I have discussed with the patient the benefits, risks, side-effects, alternatives, likelihood of achieving goals, and potential problems during recovery for the procedure that I have provided informed consent. Date  Time: 01/21/2024 12:33 PM  Pre-Procedure Preparation:  Monitoring: As per clinic protocol. Respiration, ETCO2, SpO2, BP, heart rate and rhythm monitor placed and checked for adequate function Safety Precautions: Patient was assessed for positional comfort and pressure points before starting the procedure. Time-out: I initiated and conducted the Time-out before starting the procedure, as per protocol. The patient was asked to participate by confirming the accuracy of the Time Out information. Verification of the correct person, site, and procedure were performed and confirmed by me, the nursing staff, and the patient. Time-out conducted as per Joint Commission's Universal Protocol (UP.01.01.01). Time: 1322 Start Time: 1322 hrs.  Description/Narrative of Procedure:          Target: The 6 o'clock position under the pedicle, on the affected side. Region: Posterolateral Lumbosacral Approach: Posterior Percutaneous Paravertebral approach.  Rationale (medical necessity): procedure needed and proper for the diagnosis and/or treatment of the patient's medical symptoms and needs. Procedural Technique Safety Precautions: Aspiration looking for blood return was conducted prior to all injections. At no point  did we inject any substances, as a needle was being advanced. No attempts were made at seeking any paresthesias. Safe injection practices and needle disposal techniques used. Medications properly checked for expiration dates. SDV (single dose vial) medications used. Description of the Procedure: Protocol guidelines were followed. The patient was placed in position  over the procedure table. The target area was identified and the area prepped in the usual manner. Skin & deeper tissues infiltrated with local anesthetic. Appropriate amount of time allowed to pass for local anesthetics to take effect. The procedure needles were then advanced to the target area. Proper needle placement secured. Negative aspiration confirmed. Solution injected in intermittent fashion, asking for systemic symptoms every 0.5cc of injectate. The needles were then removed and the area cleansed, making sure to leave some of the prepping solution back to take advantage of its long term bactericidal properties.  Vitals:   01/21/24 1252 01/21/24 1319 01/21/24 1324  BP: 132/81 (!) 137/108 (!) 131/99  Pulse: 81    Resp: 16 20 13   Temp: 98.4 F (36.9 C)    TempSrc: Temporal    SpO2: 95% 92% 93%  Weight: 242 lb (109.8 kg)    Height: 5' 2 (1.575 m)      Start Time: 1322 hrs. End Time:   hrs.  Imaging Guidance (Spinal):          Type of Imaging Technique: Fluoroscopy Guidance (Spinal) Indication(s): Fluoroscopy guidance for needle placement to enhance accuracy in procedures requiring precise needle localization for targeted delivery of medication in or near specific anatomical locations not easily accessible without such real-time imaging assistance. Exposure Time: Please see nurses notes. Contrast: Before injecting any contrast, we confirmed that the patient did not have an allergy to iodine, shellfish, or radiological contrast. Once satisfactory needle placement was completed at the desired level, radiological contrast was  injected. Contrast injected under live fluoroscopy. No contrast complications. See chart for type and volume of contrast used. Fluoroscopic Guidance: I was personally present during the use of fluoroscopy. Tunnel Vision Technique used to obtain the best possible view of the target area. Parallax error corrected before commencing the procedure. Direction-depth-direction technique used to introduce the needle under continuous pulsed fluoroscopy. Once target was reached, antero-posterior, oblique, and lateral fluoroscopic projection used confirm needle placement in all planes. Images permanently stored in EMR. Interpretation: I personally interpreted the imaging intraoperatively. Adequate needle placement confirmed in multiple planes. Appropriate spread of contrast into desired area was observed. No evidence of afferent or efferent intravascular uptake. No intrathecal or subarachnoid spread observed. Permanent images saved into the patient's record.  Post-operative Assessment:  Post-procedure Vital Signs:  Pulse/HCG Rate: 8195 Temp: 98.4 F (36.9 C) Resp: 13 BP: (!) 131/99 SpO2: 93 %  EBL: None  Complications: No immediate post-treatment complications observed by team, or reported by patient.  Note: The patient tolerated the entire procedure well. A repeat set of vitals were taken after the procedure and the patient was kept under observation following institutional policy, for this type of procedure. Post-procedural neurological assessment was performed, showing return to baseline, prior to discharge. The patient was provided with post-procedure discharge instructions, including a section on how to identify potential problems. Should any problems arise concerning this procedure, the patient was given instructions to immediately contact us , at any time, without hesitation. In any case, we plan to contact the patient by telephone for a follow-up status report regarding this interventional  procedure.  Comments:  No additional relevant information.  Plan of Care (POC)  Orders:  Orders Placed This Encounter  Procedures   DG PAIN [facility] C-ARM 1-60 MIN NO REPORT    Intraoperative interpretation by procedural physician at [facility].    Standing Status:   Standing    Number of Occurrences:   1    Reason  for exam::   Assistance in needle guidance and placement for procedures requiring needle placement in or near specific anatomical locations not easily accessible without such assistance.     Medications ordered for procedure: Meds ordered this encounter  Medications   iohexol  (OMNIPAQUE ) 180 MG/ML injection 10 mL    Must be Myelogram-compatible. If not available, you may substitute with a water-soluble, non-ionic, hypoallergenic, myelogram-compatible radiological contrast medium.   lidocaine  (XYLOCAINE ) 2 % (with pres) injection 400 mg   diazepam  (VALIUM ) tablet 10 mg    Make sure Flumazenil is available in the pyxis when using this medication. If oversedation occurs, administer 0.2 mg IV over 15 sec. If after 45 sec no response, administer 0.2 mg again over 1 min; may repeat at 1 min intervals; not to exceed 4 doses (1 mg)   sodium chloride  flush (NS) 0.9 % injection 2 mL   ropivacaine  (PF) 2 mg/mL (0.2%) (NAROPIN ) injection 2 mL   dexamethasone  (DECADRON ) injection 10 mg   Medications administered: We administered iohexol , lidocaine , diazepam , sodium chloride  flush, ropivacaine  (PF) 2 mg/mL (0.2%), and dexamethasone .  See the medical record for exact dosing, route, and time of administration.    Left L3 TF ESI    Follow-up plan:   No follow-ups on file.     Recent Visits Date Type Provider Dept  01/06/24 Office Visit Marcelino Nurse, MD [facility]  Showing recent visits within past 90 days and meeting all other requirements Today's Visits Date Type Provider Dept  01/21/24 Procedure visit Marcelino Nurse, MD [facility]  Showing  today's visits and meeting all other requirements Future Appointments No visits were found meeting these conditions. Showing future appointments within next 90 days and meeting all other requirements   Disposition: Discharge home  Discharge (Date  Time): 01/21/2024;   hrs.   Primary Care Physician: Rilla Baller, MD Location: [facility] Note by: Nurse Marcelino, MD (TTS technology used. I apologize for any typographical errors that were not detected and corrected.) Date: 01/21/2024; Time: 1:31 PM  Disclaimer:  Medicine is not an Visual merchandiser. The only guarantee in medicine is that nothing is guaranteed. It is important to note that the decision to proceed with this intervention was based on the information collected from the patient. The Data and conclusions were drawn from the patient's questionnaire, the interview, and the physical examination. Because the information was provided in large part by the patient, it cannot be guaranteed that it has not been purposely or unconsciously manipulated. Every effort has been made to obtain as much relevant data as possible for this evaluation. It is important to note that the conclusions that lead to this procedure are derived in large part from the available data. Always take into account that the treatment will also be dependent on availability of resources and existing treatment guidelines, considered by other Pain Management Practitioners as being common knowledge and practice, at the time of the intervention. For Medico-Legal purposes, it is also important to point out that variation in procedural techniques and pharmacological choices are the acceptable norm. The indications, contraindications, technique, and results of the above procedure should only be interpreted and judged by a Board-Certified Interventional Pain Specialist with extensive familiarity and expertise in the same exact procedure and technique.

## 2024-01-21 NOTE — Therapy (Signed)
 OUTPATIENT PHYSICAL THERAPY TREATMENT   Patient Name: Stacey Nichols MRN: [id number] DOB:[birth year], 63 y.o., female Today's Date: 01/21/2024  END OF SESSION:  PT End of Session - 01/21/24 0733     Visit Number 6    Number of Visits 17    Date for PT Re-Evaluation 02/27/24    PT Start Time 0734    PT Stop Time 0815    PT Time Calculation (min) 41 min    Activity Tolerance Patient tolerated treatment well    Behavior During Therapy [facility] for tasks assessed/performed               Past Medical History:  Diagnosis Date   AML (acute myeloblastic leukemia) (HCC) 2007   s/p chemo (Dr Alto)   Anemia    hx of   Anxiety    Arthritis    severe   Constipation    COVID-19 virus infection 07/04/2021   Depression    Diabetes mellitus without complication (HCC)    Fatty liver    GERD (gastroesophageal reflux disease)    H/O Bell's palsy    H/O hiatal hernia    Headache    occasional   High triglycerides    History of cancer chemotherapy    Leukemia (HCC)    Morbidly obese (HCC)    Osteopenia 06/08/2017   DEXA 2017 - T -1.8 spine   Pneumonia    hx of    Rheumatoid arthritis (HCC)    Sleep apnea    Stroke (HCC)    mini stroke-caused bells palsy for 1 month   Past Surgical History:  Procedure Laterality Date   ABDOMINAL HYSTERECTOMY  2001   heavy bleeding, fibroids, 1 ovary remains   BLEPHAROPLASTY Bilateral 06/2020   in [place]   COLONOSCOPY WITH PROPOFOL  N/A 09/19/2020   TA, int hem, rpt 7 yrs Renne, Darren, MD)   FACIAL COSMETIC SURGERY  06/2020   in [place]   LAPAROSCOPIC GASTRIC BAND REMOVAL WITH LAPAROSCOPIC GASTRIC SLEEVE RESECTION  04/26/2014   LAPAROSCOPIC GASTRIC BANDING  2000   performed in [place]   REPLACEMENT TOTAL KNEE Left 10/2017   Dr D[facility]iss [facility]   REPLACEMENT TOTAL KNEE Right 06/2018   Dr Darliss Persons [facility]   TUBAL LIGATION  1996   UPPER GI ENDOSCOPY N/A 04/26/2014   Procedure: UPPER GI ENDOSCOPY;  Surgeon: Donnice KATHEE Lunger, MD;   Location: [facility];  Service: General;  Laterality: N/A;   Patient Active Problem List   Diagnosis Date Noted   Chronic radicular lumbar pain 01/06/2024   Lumbar foraminal stenosis 01/06/2024   Abnormal x-ray of lumbar spine 10/25/2023   Abnormal urinalysis 10/25/2023   Generalized body aches 10/25/2023   Insulin  resistance syndrome 10/02/2023   Hyperinsulinemia 10/02/2023   Right wrist pain 10/02/2023   Gastroesophageal reflux disease without esophagitis 10/02/2023   Polyarthralgia 05/22/2023   Ex-smoker 03/12/2023   Osteoarthritis 12/10/2022   Insomnia 01/30/2022   Polyp of ascending colon    Recurrent respiratory infection 07/10/2020   Depression 07/10/2020   History of Bell's palsy 07/10/2020   History of colitis 07/10/2020   History of stroke 07/10/2020   Low serum vitamin B12 07/08/2020   Dysuria 06/28/2020   Dry mouth 06/28/2020   Verruca vulgaris 06/28/2020   Borderline hypothyroidism 04/08/2020   Encounter for general adult medical examination with abnormal findings 03/03/2018   NAFLD (nonalcoholic fatty liver disease) 91/79/7980   Osteopenia 06/08/2017   Trigeminal neuralgia 05/01/2017   Type 2 diabetes mellitus with  other specified complication (HCC) 05/27/2016   Primary osteoarthritis of both knees 09/29/2015   Environmental and seasonal allergies 09/29/2015   Arthralgia of hands, bilateral 07/12/2015   Arthralgia of hip 07/12/2015   Degeneration of intervertebral disc of lumbar region with discogenic back pain and lower extremity pain 07/12/2015   Dyslipidemia 06/20/2015   Vitamin D  deficiency 06/20/2015   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   Obesity, morbid, BMI 40.0-49.9 (HCC) 01/10/2014   OSA (obstructive sleep apnea) 01/10/2014   Seasonal allergic rhinitis 09/30/2011   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   History of laparoscopic adjustable gastric banding 2000    PCP: Rilla Baller, MD   REFERRING  PROVIDER: Hilma Hastings, PA-C  REFERRING DIAG: [phone] (ICD-10-CM) - Lumbar spondylosis M43.16 (ICD-10-CM) - Spondylolisthesis of lumbar region M41.20 (ICD-10-CM) - Other idiopathic scoliosis, unspecified spinal region M54.16 (ICD-10-CM) - Lumbar radiculopathy M51.26 (ICD-10-CM) - HNP (herniated nucleus pulposus), lumbar  Rationale for Evaluation and Treatment: Rehabilitation  THERAPY DIAG:  Other low back pain  Radiculopathy, lumbar region  Sciatica, right side  Sciatica, left side  ONSET DATE: February 2025  SUBJECTIVE:                                                                                                                                                                                           SUBJECTIVE STATEMENT: Yesterday back was ok, back bothered her this morning again. 6/10 low back, 2/10 L LE.     Interpreter present      PERTINENT HISTORY:  Low back pain. Feels like she does not need an interpreter. Pain started February 2025, got tests, was told that it was due to back bones. Was given medicine which has not helped. Pain has worsened since onset. Pain comes and goes. L LE bothers her (along L L5 dermatome, not past the knee), pt also feels pain at L hip joint. The R side also hurts some times (R L5 dermatome), not past the knee with numbness and pain. Going to get injections next week to see if that helps.   Unknown mechanism of injury, sudden onset L lateral thigh with gradual worsening of pain  Had something similar 4 years ago but the pain went away. This time, the pain has not gone away.   PLOF: was able to ambulate about 1.5 hours and do anything she wanted to.   Interpreter present. [facility])  Interpreter was used during the majority of the session.   (Having interpreter during future sessions is recommended based on the eval)  No blood pressure problems per pt.   LATEX ALLERGIES   PAIN:  Are you having pain? Yes: NPRS scale: 0/10 (pt  currently sitting) Pain location: low back, and L > R LE at L5 dermatome, not past the knees.  Pain description: sharp, pins and needles, numbness Aggravating factors: sitting down, standing up, laying down, staying still in one position Relieving factors: movement, change positions, walk a little bit.   PRECAUTIONS: Osteopenia  RED FLAGS: Bowel or bladder incontinence: No and Cauda equina syndrome: No   WEIGHT BEARING RESTRICTIONS: No  FALLS:  Has patient fallen in last 6 months? No  LIVING ENVIRONMENT: Lives with: lives alone Lives in: House/apartment Stairs: Yes: Internal: 15 steps; on left going up and External: 6 steps; none Has following equipment at home: Single point cane and walker without wheels  OCCUPATION: Not working.   PLOF: Independent  PATIENT GOALS: walk normally or the best that she could without pain.   NEXT MD VISIT: August 2025  OBJECTIVE:  Note: Objective measures were completed at Evaluation unless otherwise noted.  DIAGNOSTIC FINDINGS:  MR Lumbar Spine Wo Contrast  11/19/2023  Narrative & Impression  CLINICAL DATA:  Lumbar radiculopathy with symptoms persisting over 6 weeks of treatment   EXAM: MRI LUMBAR SPINE WITHOUT CONTRAST   TECHNIQUE: Multiplanar, multisequence MR imaging of the lumbar spine was performed. No intravenous contrast was administered.   COMPARISON:  None Available.   FINDINGS: Segmentation:  Standard.   Alignment:  Trace scoliosis and mild L4-5 anterolisthesis   Vertebrae: No fracture, evidence of discitis, or bone lesion. Mild degenerative superior endplate edema at L4   Conus medullaris and cauda equina: Conus extends to the L1-2 level. Conus and cauda equina appear normal.   Paraspinal and other soft tissues: No perispinal mass or inflammation.   Disc levels:   T12- L1: Disc bulging and right facet spurring.   L1-L2: Circumferential disc bulging.  Negative facets   L2-L3: Disc bulging with narrowing and  desiccation. Small central protrusion. Mild degenerative facet spurring.   L3-L4: Disc narrowing and bulging with moderate facet spurring. Degeneration and epidural fat expansion causes moderate thecal sac stenosis, 6 mm in the midline. Left foraminal to extraforaminal herniation contacting the left L3 nerve root.   L4-L5: Disc narrowing and bulging with tiny central protrusion. Degenerative facet spurring with ligamentum flavum thickening greater on the right. Epidural fat and degeneration causes moderate thecal sac stenosis. Patent foramina.   L5-S1:Disc desiccation and narrowing with small central protrusion. There is a right foraminal protrusion up lifting the L5 nerve root. Degenerative facet spurring greater on the right.   IMPRESSION: Generalized lumbar spine degeneration with scoliosis.   Focal foraminal impingement on the right at L5-S1 mainly due to disc protrusion.   On the symptomatic left side there is a L3-4 left far-lateral herniation which could affect the left L3 nerve root.   Degeneration and epidural fat causes moderate thecal sac stenosis at L3-4 and L4-5.     Electronically Signed   By: Dorn Roulette M.D.   On: 12/05/2023 08:11   DG Lumbar Spine Complete 10/24/2023  Narrative & Impression  CLINICAL DATA:  left low back pain with radiculopathy x 6 + wks   EXAM: LUMBAR SPINE - COMPLETE 4+ VIEW   COMPARISON:  X-ray lumbar spine 07/12/2015   FINDINGS: There is no evidence of lumbar spine fracture. Interval worsening of multilevel moderate degenerative changes of the spine. Interval development of dextroscoliosis centered at the L3 level with associated 1.4 cm left to right translocation of the L4 vertebral body relation to  the L5 level. Suggestion of likely osseous neural foraminal stenosis. Multilevel moderate intervertebral disc space narrowing, most prominent at the L5-S1 level.   IMPRESSION: Interval development of dextroscoliosis centered  at the L3 level with associated 1.4 cm left to right translocation of the L4 vertebral body relation to the L5 level. Consider cross-sectional imaging for further evaluation.     Electronically Signed   By: Morgane  Naveau M.D.   On: 10/25/2023 00:19    DG Si Joints 10/24/2023  Narrative & Impression  CLINICAL DATA:  Left SI JT pain.   EXAM: BILATERAL SACROILIAC JOINTS - 3 VIEW   COMPARISON:  None Available.   FINDINGS: There is bilateral hip degenerative change with joint space narrowing and small osteophytes. Pelvic ring is intact. The sacroiliac joint spaces are maintained and there is no evidence of arthropathy. No acute fracture, dislocation or subluxation. No osteolytic or osteoblastic lesions.   IMPRESSION: Bilateral hip degenerative changes. No sacroiliac joint arthropathy identified. No acute osseous abnormalities.     Electronically Signed   By: Fonda Field M.D.   On: 10/24/2023 21:53           PATIENT SURVEYS:  Modified Oswestry 17/50 (34%), (12/30/2023)   COGNITION: Overall cognitive status: Within functional limits for tasks assessed     SENSATION:   MUSCLE LENGTH:   POSTURE: R weight shift, forward neck, thoracic kyphosis, decreased L LE weight bearing, R lateral shift, increased R lateral shift around L2/3 area.   Increased L LE L5 dermatome pain with manual R lateral shift correction.    PALPATION:   LUMBAR ROM:   AROM eval  Flexion Full, no pain  Extension Limited with reproduction of back and L LE pain   Right lateral flexion full  Left lateral flexion Limited with L lateral thigh pain  Right rotation WFL with L lateral thigh pain  Left rotation WFL with low Back pain   (Blank rows = not tested)  LOWER EXTREMITY ROM:     Passive  Right eval Left eval  Hip flexion    Hip extension    Hip abduction    Hip adduction    Hip internal rotation 33 12 with L posterior and anterior hip joint pain  Hip external rotation  64 60  Knee flexion    Knee extension    Ankle dorsiflexion    Ankle plantarflexion    Ankle inversion    Ankle eversion     (Blank rows = not tested)  LOWER EXTREMITY MMT:    MMT Right eval Left eval  Hip flexion 4 4- with L lateral posterior thigh pain  Hip extension (seated manually resisted) 3 3  Hip abduction (seated manually resisted) 4 4  Hip adduction    Hip internal rotation    Hip external rotation    Knee flexion 5 4  Knee extension 5 4+  Ankle dorsiflexion 5 5  Ankle plantarflexion    Ankle inversion    Ankle eversion     (Blank rows = not tested)  LUMBAR SPECIAL TESTS:  Long sit test suggests anterior nutation of L innominate.   FUNCTIONAL TESTS:    GAIT: Distance walked: 10 ft Assistive device utilized: Single point cane Level of assistance: Modified independence Comments: decreased stance L LE, antalgic, SPC on R  TREATMENT DATE: 01/21/2024  Manual therapy   Supine manual lumbar traction      Neuromuscular re education  S/L hip abduction   L 5x3  R 5x3  S/L clamshell  L 10x3  R 10x3  Hooklying  Posterior pelvic tilt 4x5 seconds   L sciatic symptoms posterior hip   Hip extension isometrics, leg straight L 1x5 seconds    L sciatic symptoms posterior hip     Sitting with upright posture  Gentle manually resisted L lateral shift isometrics in neutral to decrease L lumbar convexity    10x5 seconds for 3 sets    Transversus abdominis contraction with B shoulder extension isometrics 10x5 seconds for 2 sets  2/10 low back and L LE during gait after session.    Improved exercise technique, movement at target joints, use of target muscles after mod verbal, visual, tactile cues.    PATIENT EDUCATION:  Education details: there-ex, HEP Person educated: Patient Education method: Explanation, Demonstration,  Tactile cues, Verbal cues, Handouts, and interpreter Education comprehension: verbalized understanding and returned demonstration  HOME EXERCISE PROGRAM: Access Code: [email or web address] URL: [email or web address] Date: 01/01/2024 Prepared by: Emil Glassman  Exercises - Supine Transversus Abdominis Bracing - Hands on Stomach  - 3 x daily - 7 x weekly - 3 sets - 10 reps - 5 seconds hold - Supine Posterior Pelvic Tilt  - 1 x daily - 7 x weekly - 3 sets - 10 reps - 5 seconds hold  - Standing Hip Abduction with Counter Support  - 1 x daily - 7 x weekly - 3 sets - 10 reps - Standing Hip Extension with Counter Support  - 1 x daily - 7 x weekly - 3 sets - 10 reps  ASSESSMENT:  CLINICAL IMPRESSION: Continued working on improving posture, trunk strength as well as glute med and max strengthening to decrease L trunk side bend compensation for trendelenburg gait. Decreased low back pain reported after session.   Pt will benefit from continued skilled physical therapy services to overall decrease pain, improve strength and function.       OBJECTIVE IMPAIRMENTS: Abnormal gait, difficulty walking, decreased strength, improper body mechanics, postural dysfunction, and pain.   ACTIVITY LIMITATIONS: carrying, lifting, bending, sitting, standing, squatting, stairs, transfers, bed mobility, reach over head, and locomotion level  PARTICIPATION LIMITATIONS:   PERSONAL FACTORS: Fitness, Past/current experiences, Time since onset of injury/illness/exacerbation, and 3+ comorbidities: AML, anxiety, arthritis, DM, osteopenia, RA are also affecting patient's functional outcome.   REHAB POTENTIAL: Fair    CLINICAL DECISION MAKING: Evolving/moderate complexity, pain is worsening since onset per pt  EVALUATION COMPLEXITY: Moderate   GOALS: Goals reviewed with patient? Yes  SHORT TERM GOALS: Target date: 01/09/2024  Pt will be independent with her initial HEP to decrease back pain, improve strength,  and function. Baseline: Pt has not yet started her initial HEP (12/30/2023). Goal status: INITIAL   LONG TERM GOALS: Target date: 02/27/2024  PT will improve her Modified Oswestry score by at least 10% as a demonstration of improved function.  Baseline: Modified Oswestry 17/50 (34%), (12/30/2023)  Goal status: INITIAL  2.  Pt will have a decrease in low back pain to 5/10 or less at worst to promote ability to ambulate as well as perform standing tasks more comfortably.  Baseline: Low back: 10+/10 at worst for the past 3 months. Pain sometimes hurt in the morning and cannot move (12/30/2023) Goal status: INITIAL  3.  Pt will have a decrease in L LE and R LE pain to 5/10  or less at worst to promote ability to ambulate as well as perform standing tasks more comfortably.  Baseline: L LE: 10+/10 at worst for the past 3 months, R LE: 8/10 at worst for the past 3 months. (12/30/2023) Goal status: INITIAL  4.  Pt will improve B hip strength by at least 1/2 MMT grade to promote ability to perform standing tasks as well as ambulate more comfortably for her back and B LE. Baseline:  MMT Right eval Left eval  Hip flexion 4 4- with L lateral posterior thigh pain  Hip extension (seated manually resisted) 3 3  Hip abduction (seated manually resisted) 4 4   Goal status: INITIAL   PLAN:  PT FREQUENCY: 1-2x/week  PT DURATION: 8 weeks  PLANNED INTERVENTIONS: 97110-Therapeutic exercises, 97530- Therapeutic activity, [id number]- Neuromuscular re-education, 97535- Self Care, [id number]- Manual therapy, [id number]- Gait training, [phone]- Aquatic Therapy, [id number]- Electrical stimulation (unattended), [id number]- Traction (mechanical), [id number]- Ionotophoresis 4mg /ml Dexamethasone , Patient/Family education, and Stair training.  PLAN FOR NEXT SESSION: improve posture, trunk and glute strength, manual techniques, modalities PRN   Rubi Tooley, PT, DPT 01/21/2024, 8:19 AM

## 2024-01-21 NOTE — Patient Instructions (Signed)

## 2024-01-22 ENCOUNTER — Encounter

## 2024-01-22 ENCOUNTER — Telehealth: Payer: Self-pay | Admitting: *Deleted

## 2024-01-22 NOTE — Telephone Encounter (Signed)
 Attempted to call for post procedure follow-up. Message left.

## 2024-01-26 ENCOUNTER — Ambulatory Visit

## 2024-01-26 DIAGNOSIS — M5459 Other low back pain: Secondary | ICD-10-CM

## 2024-01-26 DIAGNOSIS — M5432 Sciatica, left side: Secondary | ICD-10-CM

## 2024-01-26 DIAGNOSIS — M5416 Radiculopathy, lumbar region: Secondary | ICD-10-CM

## 2024-01-26 DIAGNOSIS — M5431 Sciatica, right side: Secondary | ICD-10-CM

## 2024-01-26 NOTE — Therapy (Signed)
 OUTPATIENT PHYSICAL THERAPY TREATMENT   Patient Name: Stacey Nichols MRN: [id number] DOB:1961-06-16, 63 y.o., female Today's Date: 01/26/2024  END OF SESSION:  PT End of Session - 01/26/24 0735     Visit Number 7    Number of Visits 17    Date for PT Re-Evaluation 02/27/24    PT Start Time 0735    PT Stop Time 0813    PT Time Calculation (min) 38 min    Activity Tolerance Patient tolerated treatment well    Behavior During Therapy [facility] for tasks assessed/performed                Past Medical History:  Diagnosis Date   AML (acute myeloblastic leukemia) (HCC) 2007   s/p chemo (Dr Alto)   Anemia    hx of   Anxiety    Arthritis    severe   Constipation    COVID-19 virus infection 07/04/2021   Depression    Diabetes mellitus without complication (HCC)    Fatty liver    GERD (gastroesophageal reflux disease)    H/O Bell's palsy    H/O hiatal hernia    Headache    occasional   High triglycerides    History of cancer chemotherapy    Leukemia (HCC)    Morbidly obese (HCC)    Osteopenia 06/08/2017   DEXA 2017 - T -1.8 spine   Pneumonia    hx of    Rheumatoid arthritis (HCC)    Sleep apnea    Stroke (HCC)    mini stroke-caused bells palsy for 1 month   Past Surgical History:  Procedure Laterality Date   ABDOMINAL HYSTERECTOMY  2001   heavy bleeding, fibroids, 1 ovary remains   BLEPHAROPLASTY Bilateral 06/2020   in [place]   COLONOSCOPY WITH PROPOFOL  N/A 09/19/2020   TA, int hem, rpt 7 yrs Renne, Darren, MD)   FACIAL COSMETIC SURGERY  06/2020   in [place]   LAPAROSCOPIC GASTRIC BAND REMOVAL WITH LAPAROSCOPIC GASTRIC SLEEVE RESECTION  04/26/2014   LAPAROSCOPIC GASTRIC BANDING  2000   performed in [place]   REPLACEMENT TOTAL KNEE Left 10/2017   Dr Darliss [facility]ons [facility]   REPLACEMENT TOTAL KNEE Right 06/2018   Dr Darliss Persons [facility]   TUBAL LIGATION  1996   UPPER GI ENDOSCOPY N/A 04/26/2014   Procedure: UPPER GI ENDOSCOPY;  Surgeon: Donnice KATHEE Lunger, MD;   Location: [facility];  Service: General;  Laterality: N/A;   Patient Active Problem List   Diagnosis Date Noted   Chronic radicular lumbar pain 01/06/2024   Lumbar foraminal stenosis 01/06/2024   Abnormal x-ray of lumbar spine 10/25/2023   Abnormal urinalysis 10/25/2023   Generalized body aches 10/25/2023   Insulin  resistance syndrome 10/02/2023   Hyperinsulinemia 10/02/2023   Right wrist pain 10/02/2023   Gastroesophageal reflux disease without esophagitis 10/02/2023   Polyarthralgia 05/22/2023   Ex-smoker 03/12/2023   Osteoarthritis 12/10/2022   Insomnia 01/30/2022   Polyp of ascending colon    Recurrent respiratory infection 07/10/2020   Depression 07/10/2020   History of Bell's palsy 07/10/2020   History of colitis 07/10/2020   History of stroke 07/10/2020   Low serum vitamin B12 07/08/2020   Dysuria 06/28/2020   Dry mouth 06/28/2020   Verruca vulgaris 06/28/2020   Borderline hypothyroidism 04/08/2020   Encounter for general adult medical examination with abnormal findings 03/03/2018   NAFLD (nonalcoholic fatty liver disease) 91/79/7980   Osteopenia 06/08/2017   Trigeminal neuralgia 05/01/2017   Type 2 diabetes mellitus  with other specified complication (HCC) 05/27/2016   Primary osteoarthritis of both knees 09/29/2015   Environmental and seasonal allergies 09/29/2015   Arthralgia of hands, bilateral 07/12/2015   Arthralgia of hip 07/12/2015   Degeneration of intervertebral disc of lumbar region with discogenic back pain and lower extremity pain 07/12/2015   Dyslipidemia 06/20/2015   Vitamin D  deficiency 06/20/2015   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   Obesity, morbid, BMI 40.0-49.9 (HCC) 01/10/2014   OSA (obstructive sleep apnea) 01/10/2014   Seasonal allergic rhinitis 09/30/2011   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   History of laparoscopic adjustable gastric banding 2000    PCP: Rilla Baller, MD   REFERRING  PROVIDER: Hilma Hastings, PA-C  REFERRING DIAG: [phone] (ICD-10-CM) - Lumbar spondylosis M43.16 (ICD-10-CM) - Spondylolisthesis of lumbar region M41.20 (ICD-10-CM) - Other idiopathic scoliosis, unspecified spinal region M54.16 (ICD-10-CM) - Lumbar radiculopathy M51.26 (ICD-10-CM) - HNP (herniated nucleus pulposus), lumbar  Rationale for Evaluation and Treatment: Rehabilitation  THERAPY DIAG:  Other low back pain  Radiculopathy, lumbar region  Sciatica, right side  Sciatica, left side  ONSET DATE: February 2025  SUBJECTIVE:                                                                                                                                                                                           SUBJECTIVE STATEMENT: Back is good. The L LE is what is hurting her. Got the injection on Wednesday which helped, 99% better. Walking increases L LE pain. 1-2/10 L LE currently when walking from the waiting room to gym.    Interpreter present      PERTINENT HISTORY:  Low back pain. Feels like she does not need an interpreter. Pain started February 2025, got tests, was told that it was due to back bones. Was given medicine which has not helped. Pain has worsened since onset. Pain comes and goes. L LE bothers her (along L L5 dermatome, not past the knee), pt also feels pain at L hip joint. The R side also hurts some times (R L5 dermatome), not past the knee with numbness and pain. Going to get injections next week to see if that helps.   Unknown mechanism of injury, sudden onset L lateral thigh with gradual worsening of pain  Had something similar 4 years ago but the pain went away. This time, the pain has not gone away.   PLOF: was able to ambulate about 1.5 hours and do anything she wanted to.   Interpreter present. [facility])  Interpreter was used during the majority of the session.   (Having interpreter during future  sessions is recommended based on the eval)  No blood  pressure problems per pt.   LATEX ALLERGIES   PAIN:  Are you having pain? Yes: NPRS scale: 0/10 (pt currently sitting) Pain location: low back, and L > R LE at L5 dermatome, not past the knees.  Pain description: sharp, pins and needles, numbness Aggravating factors: sitting down, standing up, laying down, staying still in one position Relieving factors: movement, change positions, walk a little bit.   PRECAUTIONS: Osteopenia  RED FLAGS: Bowel or bladder incontinence: No and Cauda equina syndrome: No   WEIGHT BEARING RESTRICTIONS: No  FALLS:  Has patient fallen in last 6 months? No  LIVING ENVIRONMENT: Lives with: lives alone Lives in: House/apartment Stairs: Yes: Internal: 15 steps; on left going up and External: 6 steps; none Has following equipment at home: Single point cane and walker without wheels  OCCUPATION: Not working.   PLOF: Independent  PATIENT GOALS: walk normally or the best that she could without pain.   NEXT MD VISIT: August 2025  OBJECTIVE:  Note: Objective measures were completed at Evaluation unless otherwise noted.  DIAGNOSTIC FINDINGS:  MR Lumbar Spine Wo Contrast  11/19/2023  Narrative & Impression  CLINICAL DATA:  Lumbar radiculopathy with symptoms persisting over 6 weeks of treatment   EXAM: MRI LUMBAR SPINE WITHOUT CONTRAST   TECHNIQUE: Multiplanar, multisequence MR imaging of the lumbar spine was performed. No intravenous contrast was administered.   COMPARISON:  None Available.   FINDINGS: Segmentation:  Standard.   Alignment:  Trace scoliosis and mild L4-5 anterolisthesis   Vertebrae: No fracture, evidence of discitis, or bone lesion. Mild degenerative superior endplate edema at L4   Conus medullaris and cauda equina: Conus extends to the L1-2 level. Conus and cauda equina appear normal.   Paraspinal and other soft tissues: No perispinal mass or inflammation.   Disc levels:   T12- L1: Disc bulging and right facet  spurring.   L1-L2: Circumferential disc bulging.  Negative facets   L2-L3: Disc bulging with narrowing and desiccation. Small central protrusion. Mild degenerative facet spurring.   L3-L4: Disc narrowing and bulging with moderate facet spurring. Degeneration and epidural fat expansion causes moderate thecal sac stenosis, 6 mm in the midline. Left foraminal to extraforaminal herniation contacting the left L3 nerve root.   L4-L5: Disc narrowing and bulging with tiny central protrusion. Degenerative facet spurring with ligamentum flavum thickening greater on the right. Epidural fat and degeneration causes moderate thecal sac stenosis. Patent foramina.   L5-S1:Disc desiccation and narrowing with small central protrusion. There is a right foraminal protrusion up lifting the L5 nerve root. Degenerative facet spurring greater on the right.   IMPRESSION: Generalized lumbar spine degeneration with scoliosis.   Focal foraminal impingement on the right at L5-S1 mainly due to disc protrusion.   On the symptomatic left side there is a L3-4 left far-lateral herniation which could affect the left L3 nerve root.   Degeneration and epidural fat causes moderate thecal sac stenosis at L3-4 and L4-5.     Electronically Signed   By: Dorn Roulette M.D.   On: 12/05/2023 08:11   DG Lumbar Spine Complete 10/24/2023  Narrative & Impression  CLINICAL DATA:  left low back pain with radiculopathy x 6 + wks   EXAM: LUMBAR SPINE - COMPLETE 4+ VIEW   COMPARISON:  X-ray lumbar spine 07/12/2015   FINDINGS: There is no evidence of lumbar spine fracture. Interval worsening of multilevel moderate degenerative changes of the spine. Interval development  of dextroscoliosis centered at the L3 level with associated 1.4 cm left to right translocation of the L4 vertebral body relation to the L5 level. Suggestion of likely osseous neural foraminal stenosis. Multilevel moderate intervertebral disc  space narrowing, most prominent at the L5-S1 level.   IMPRESSION: Interval development of dextroscoliosis centered at the L3 level with associated 1.4 cm left to right translocation of the L4 vertebral body relation to the L5 level. Consider cross-sectional imaging for further evaluation.     Electronically Signed   By: Morgane  Naveau M.D.   On: 10/25/2023 00:19    DG Si Joints 10/24/2023  Narrative & Impression  CLINICAL DATA:  Left SI JT pain.   EXAM: BILATERAL SACROILIAC JOINTS - 3 VIEW   COMPARISON:  None Available.   FINDINGS: There is bilateral hip degenerative change with joint space narrowing and small osteophytes. Pelvic ring is intact. The sacroiliac joint spaces are maintained and there is no evidence of arthropathy. No acute fracture, dislocation or subluxation. No osteolytic or osteoblastic lesions.   IMPRESSION: Bilateral hip degenerative changes. No sacroiliac joint arthropathy identified. No acute osseous abnormalities.     Electronically Signed   By: Fonda Field M.D.   On: 10/24/2023 21:53           PATIENT SURVEYS:  Modified Oswestry 17/50 (34%), (12/30/2023)   COGNITION: Overall cognitive status: Within functional limits for tasks assessed     SENSATION:   MUSCLE LENGTH:   POSTURE: R weight shift, forward neck, thoracic kyphosis, decreased L LE weight bearing, R lateral shift, increased R lateral shift around L2/3 area.   Increased L LE L5 dermatome pain with manual R lateral shift correction.    PALPATION:   LUMBAR ROM:   AROM eval  Flexion Full, no pain  Extension Limited with reproduction of back and L LE pain   Right lateral flexion full  Left lateral flexion Limited with L lateral thigh pain  Right rotation WFL with L lateral thigh pain  Left rotation WFL with low Back pain   (Blank rows = not tested)  LOWER EXTREMITY ROM:     Passive  Right eval Left eval  Hip flexion    Hip extension    Hip abduction     Hip adduction    Hip internal rotation 33 12 with L posterior and anterior hip joint pain  Hip external rotation 64 60  Knee flexion    Knee extension    Ankle dorsiflexion    Ankle plantarflexion    Ankle inversion    Ankle eversion     (Blank rows = not tested)  LOWER EXTREMITY MMT:    MMT Right eval Left eval  Hip flexion 4 4- with L lateral posterior thigh pain  Hip extension (seated manually resisted) 3 3  Hip abduction (seated manually resisted) 4 4  Hip adduction    Hip internal rotation    Hip external rotation    Knee flexion 5 4  Knee extension 5 4+  Ankle dorsiflexion 5 5  Ankle plantarflexion    Ankle inversion    Ankle eversion     (Blank rows = not tested)  LUMBAR SPECIAL TESTS:  Long sit test suggests anterior nutation of L innominate.   FUNCTIONAL TESTS:    GAIT: Distance walked: 10 ft Assistive device utilized: Single point cane Level of assistance: Modified independence Comments: decreased stance L LE, antalgic, SPC on R  TREATMENT DATE: 01/26/2024  Manual therapy  Supine manual lumbar traction   Neuromuscular re education  Performed to improve posture as well as lumbopelvic stability during closed chain tasks.   Hooklying  Transversus abdominis contraction    with clamshell green band 1x. L posterior hip/sciatic symptoms.     With hip adduction isometrics small blue ball squeeze 10x5 seconds  Standing bend over onto elevated mat table               Hip abduction                        L 10x3    R 10x3                 Hip extension                         L 10x3   R 10x3                                   Cued to decrease R lateral shift.     Sitting with upright posture               Gentle manually resisted L lateral shift isometrics in neutral to decrease L lumbar convexity                         10x5  seconds for 3 sets    Transversus abdominis contraction with B shoulder extension isometrics 10x5 seconds.    Improved exercise technique, movement at target joints, use of target muscles after mod verbal, visual, tactile cues.    PATIENT EDUCATION:  Education details: there-ex, HEP Person educated: Patient Education method: Explanation, Demonstration, Tactile cues, Verbal cues, Handouts, and interpreter Education comprehension: verbalized understanding and returned demonstration  HOME EXERCISE PROGRAM: Access Code: [email or web address] URL: [email or web address] Date: 01/01/2024 Prepared by: Emil Glassman  Exercises - Supine Transversus Abdominis Bracing - Hands on Stomach  - 3 x daily - 7 x weekly - 3 sets - 10 reps - 5 seconds hold - Supine Posterior Pelvic Tilt  - 1 x daily - 7 x weekly - 3 sets - 10 reps - 5 seconds hold  - Standing Hip Abduction with Counter Support  - 1 x daily - 7 x weekly - 3 sets - 10 reps - Standing Hip Extension with Counter Support  - 1 x daily - 7 x weekly - 3 sets - 10 reps  ASSESSMENT:  CLINICAL IMPRESSION:  Improved comfort level with bent over hip extension when pt was assisted to decrease R lateral shift/R lumbar convexity posture. Continued working on decreasing lumbar extension stress as well as pressure, improving trunk and glute med and max strength to decrease back and LE pain. Pt tolerated session well without aggravation of symptoms. Pt will benefit from continued skilled physical therapy services to overall decrease pain, improve strength and function.       OBJECTIVE IMPAIRMENTS: Abnormal gait, difficulty walking, decreased strength, improper body mechanics, postural dysfunction, and pain.   ACTIVITY LIMITATIONS: carrying, lifting, bending, sitting, standing, squatting, stairs, transfers, bed mobility, reach over head, and locomotion level  PARTICIPATION LIMITATIONS:   PERSONAL FACTORS: Fitness, Past/current experiences, Time  since onset of injury/illness/exacerbation, and 3+ comorbidities: AML, anxiety, arthritis, DM, osteopenia, RA are also affecting patient's functional outcome.   REHAB POTENTIAL: Fair  CLINICAL DECISION MAKING: Evolving/moderate complexity, pain is worsening since onset per pt  EVALUATION COMPLEXITY: Moderate   GOALS: Goals reviewed with patient? Yes  SHORT TERM GOALS: Target date: 01/09/2024  Pt will be independent with her initial HEP to decrease back pain, improve strength, and function. Baseline: Pt has not yet started her initial HEP (12/30/2023). Goal status: INITIAL   LONG TERM GOALS: Target date: 02/27/2024  PT will improve her Modified Oswestry score by at least 10% as a demonstration of improved function.  Baseline: Modified Oswestry 17/50 (34%), (12/30/2023)  Goal status: INITIAL  2.  Pt will have a decrease in low back pain to 5/10 or less at worst to promote ability to ambulate as well as perform standing tasks more comfortably.  Baseline: Low back: 10+/10 at worst for the past 3 months. Pain sometimes hurt in the morning and cannot move (12/30/2023) Goal status: INITIAL  3.  Pt will have a decrease in L LE and R LE pain to 5/10 or less at worst to promote ability to ambulate as well as perform standing tasks more comfortably.  Baseline: L LE: 10+/10 at worst for the past 3 months, R LE: 8/10 at worst for the past 3 months. (12/30/2023) Goal status: INITIAL  4.  Pt will improve B hip strength by at least 1/2 MMT grade to promote ability to perform standing tasks as well as ambulate more comfortably for her back and B LE. Baseline:  MMT Right eval Left eval  Hip flexion 4 4- with L lateral posterior thigh pain  Hip extension (seated manually resisted) 3 3  Hip abduction (seated manually resisted) 4 4   Goal status: INITIAL   PLAN:  PT FREQUENCY: 1-2x/week  PT DURATION: 8 weeks  PLANNED INTERVENTIONS: 97110-Therapeutic exercises, 97530- Therapeutic activity,  [id number]- Neuromuscular re-education, 97535- Self Care, [id number]- Manual therapy, [id number]- Gait training, [phone]- Aquatic Therapy, [phone]- Electrical stimulation (unattended), [id number]- Traction (mechanical), [id number]- Ionotophoresis 4mg /ml Dexamethasone , Patient/Family education, and Stair training.  PLAN FOR NEXT SESSION: improve posture, trunk and glute strength, manual techniques, modalities PRN   Shaun Runyon, PT, DPT 01/26/2024, 10:03 AM

## 2024-01-28 ENCOUNTER — Ambulatory Visit

## 2024-01-28 DIAGNOSIS — M5459 Other low back pain: Secondary | ICD-10-CM

## 2024-01-28 DIAGNOSIS — M5432 Sciatica, left side: Secondary | ICD-10-CM

## 2024-01-28 DIAGNOSIS — M5416 Radiculopathy, lumbar region: Secondary | ICD-10-CM

## 2024-01-28 DIAGNOSIS — M5431 Sciatica, right side: Secondary | ICD-10-CM

## 2024-01-28 NOTE — Therapy (Signed)
 OUTPATIENT PHYSICAL THERAPY TREATMENT   Patient Name: Stacey Nichols MRN: [id number] DOB:10-31-1960, 63 y.o., female Today's Date: 01/28/2024  END OF SESSION:  PT End of Session - 01/28/24 0734     Visit Number 8    Number of Visits 17    Date for PT Re-Evaluation 02/27/24    PT Start Time 0734    PT Stop Time 0815    PT Time Calculation (min) 41 min    Activity Tolerance Patient tolerated treatment well    Behavior During Therapy [facility] for tasks assessed/performed                 Past Medical History:  Diagnosis Date   AML (acute myeloblastic leukemia) (HCC) 2007   s/p chemo (Dr Alto)   Anemia    hx of   Anxiety    Arthritis    severe   Constipation    COVID-19 virus infection 07/04/2021   Depression    Diabetes mellitus without complication (HCC)    Fatty liver    GERD (gastroesophageal reflux disease)    H/O Bell's palsy    H/O hiatal hernia    Headache    occasional   High triglycerides    History of cancer chemotherapy    Leukemia (HCC)    Morbidly obese (HCC)    Osteopenia 06/08/2017   DEXA 2017 - T -1.8 spine   Pneumonia    hx of    Rheumatoid arthritis (HCC)    Sleep apnea    Stroke (HCC)    mini stroke-caused bells palsy for 1 month   Past Surgical History:  Procedure Laterality Date   ABDOMINAL HYSTERECTOMY  2001   heavy bleeding, fibroids, 1 ovary remains   BLEPHAROPLASTY Bilateral 06/2020   in [place]   COLONOSCOPY WITH PROPOFOL  N/A 09/19/2020   TA, int hem, rpt 7 yrs Renne, Darren, MD)   FACIAL COSMETIC SURGERY  06/2020   in [place]   LAPAROSCOPIC GASTRIC BAND REMOVAL WITH LAPAROSCOPIC GASTRIC SLEEVE RESECTION  04/26/2014   LAPAROSCOPIC GASTRIC BANDING  2000   performed in [place]   REPLACEMENT TOTAL KNEE Left 10/2017   Dr [facility]   REPLACEMENT TOTAL KNEE Right 06/2018   Dr Darliss [facility]ons [facility]   TUBAL LIGATION  1996   UPPER GI ENDOSCOPY N/A 04/26/2014   Procedure: UPPER GI ENDOSCOPY;  Surgeon: Donnice KATHEE Lunger,  MD;  Location: [facility];  Service: General;  Laterality: N/A;   Patient Active Problem List   Diagnosis Date Noted   Chronic radicular lumbar pain 01/06/2024   Lumbar foraminal stenosis 01/06/2024   Abnormal x-ray of lumbar spine 10/25/2023   Abnormal urinalysis 10/25/2023   Generalized body aches 10/25/2023   Insulin  resistance syndrome 10/02/2023   Hyperinsulinemia 10/02/2023   Right wrist pain 10/02/2023   Gastroesophageal reflux disease without esophagitis 10/02/2023   Polyarthralgia 05/22/2023   Ex-smoker 03/12/2023   Osteoarthritis 12/10/2022   Insomnia 01/30/2022   Polyp of ascending colon    Recurrent respiratory infection 07/10/2020   Depression 07/10/2020   History of Bell's palsy 07/10/2020   History of colitis 07/10/2020   History of stroke 07/10/2020   Low serum vitamin B12 07/08/2020   Dysuria 06/28/2020   Dry mouth 06/28/2020   Verruca vulgaris 06/28/2020   Borderline hypothyroidism 04/08/2020   Encounter for general adult medical examination with abnormal findings 03/03/2018   NAFLD (nonalcoholic fatty liver disease) 91/79/7980   Osteopenia 06/08/2017   Trigeminal neuralgia 05/01/2017   Type 2 diabetes  mellitus with other specified complication (HCC) 05/27/2016   Primary osteoarthritis of both knees 09/29/2015   Environmental and seasonal allergies 09/29/2015   Arthralgia of hands, bilateral 07/12/2015   Arthralgia of hip 07/12/2015   Degeneration of intervertebral disc of lumbar region with discogenic back pain and lower extremity pain 07/12/2015   Dyslipidemia 06/20/2015   Vitamin D  deficiency 06/20/2015   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   Obesity, morbid, BMI 40.0-49.9 (HCC) 01/10/2014   OSA (obstructive sleep apnea) 01/10/2014   Seasonal allergic rhinitis 09/30/2011   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   History of laparoscopic adjustable gastric banding 2000    PCP: Rilla Baller,  MD   REFERRING PROVIDER: Hilma Hastings, PA-C  REFERRING DIAG: [phone] (ICD-10-CM) - Lumbar spondylosis M43.16 (ICD-10-CM) - Spondylolisthesis of lumbar region M41.20 (ICD-10-CM) - Other idiopathic scoliosis, unspecified spinal region M54.16 (ICD-10-CM) - Lumbar radiculopathy M51.26 (ICD-10-CM) - HNP (herniated nucleus pulposus), lumbar  Rationale for Evaluation and Treatment: Rehabilitation  THERAPY DIAG:  Other low back pain  Radiculopathy, lumbar region  Sciatica, right side  Sciatica, left side  ONSET DATE: February 2025  SUBJECTIVE:                                                                                                                                                                                           SUBJECTIVE STATEMENT: Back is good. 1/10 L LE pain during gait. Did not really feel any change after last session.    Interpreter present      PERTINENT HISTORY:  Low back pain. Feels like she does not need an interpreter. Pain started February 2025, got tests, was told that it was due to back bones. Was given medicine which has not helped. Pain has worsened since onset. Pain comes and goes. L LE bothers her (along L L5 dermatome, not past the knee), pt also feels pain at L hip joint. The R side also hurts some times (R L5 dermatome), not past the knee with numbness and pain. Going to get injections next week to see if that helps.   Unknown mechanism of injury, sudden onset L lateral thigh with gradual worsening of pain  Had something similar 4 years ago but the pain went away. This time, the pain has not gone away.   PLOF: was able to ambulate about 1.5 hours and do anything she wanted to.   Interpreter present. [facility])  Interpreter was used during the majority of the session.   (Having interpreter during future sessions is recommended based on the eval)  No blood pressure problems per pt.   LATEX ALLERGIES  PAIN:  Are you having pain? Yes: NPRS  scale: 0/10 (pt currently sitting) Pain location: low back, and L > R LE at L5 dermatome, not past the knees.  Pain description: sharp, pins and needles, numbness Aggravating factors: sitting down, standing up, laying down, staying still in one position Relieving factors: movement, change positions, walk a little bit.   PRECAUTIONS: Osteopenia  RED FLAGS: Bowel or bladder incontinence: No and Cauda equina syndrome: No   WEIGHT BEARING RESTRICTIONS: No  FALLS:  Has patient fallen in last 6 months? No  LIVING ENVIRONMENT: Lives with: lives alone Lives in: House/apartment Stairs: Yes: Internal: 15 steps; on left going up and External: 6 steps; none Has following equipment at home: Single point cane and walker without wheels  OCCUPATION: Not working.   PLOF: Independent  PATIENT GOALS: walk normally or the best that she could without pain.   NEXT MD VISIT: August 2025  OBJECTIVE:  Note: Objective measures were completed at Evaluation unless otherwise noted.  DIAGNOSTIC FINDINGS:  MR Lumbar Spine Wo Contrast  11/19/2023  Narrative & Impression  CLINICAL DATA:  Lumbar radiculopathy with symptoms persisting over 6 weeks of treatment   EXAM: MRI LUMBAR SPINE WITHOUT CONTRAST   TECHNIQUE: Multiplanar, multisequence MR imaging of the lumbar spine was performed. No intravenous contrast was administered.   COMPARISON:  None Available.   FINDINGS: Segmentation:  Standard.   Alignment:  Trace scoliosis and mild L4-5 anterolisthesis   Vertebrae: No fracture, evidence of discitis, or bone lesion. Mild degenerative superior endplate edema at L4   Conus medullaris and cauda equina: Conus extends to the L1-2 level. Conus and cauda equina appear normal.   Paraspinal and other soft tissues: No perispinal mass or inflammation.   Disc levels:   T12- L1: Disc bulging and right facet spurring.   L1-L2: Circumferential disc bulging.  Negative facets   L2-L3: Disc bulging with  narrowing and desiccation. Small central protrusion. Mild degenerative facet spurring.   L3-L4: Disc narrowing and bulging with moderate facet spurring. Degeneration and epidural fat expansion causes moderate thecal sac stenosis, 6 mm in the midline. Left foraminal to extraforaminal herniation contacting the left L3 nerve root.   L4-L5: Disc narrowing and bulging with tiny central protrusion. Degenerative facet spurring with ligamentum flavum thickening greater on the right. Epidural fat and degeneration causes moderate thecal sac stenosis. Patent foramina.   L5-S1:Disc desiccation and narrowing with small central protrusion. There is a right foraminal protrusion up lifting the L5 nerve root. Degenerative facet spurring greater on the right.   IMPRESSION: Generalized lumbar spine degeneration with scoliosis.   Focal foraminal impingement on the right at L5-S1 mainly due to disc protrusion.   On the symptomatic left side there is a L3-4 left far-lateral herniation which could affect the left L3 nerve root.   Degeneration and epidural fat causes moderate thecal sac stenosis at L3-4 and L4-5.     Electronically Signed   By: Dorn Roulette M.D.   On: 12/05/2023 08:11   DG Lumbar Spine Complete 10/24/2023  Narrative & Impression  CLINICAL DATA:  left low back pain with radiculopathy x 6 + wks   EXAM: LUMBAR SPINE - COMPLETE 4+ VIEW   COMPARISON:  X-ray lumbar spine 07/12/2015   FINDINGS: There is no evidence of lumbar spine fracture. Interval worsening of multilevel moderate degenerative changes of the spine. Interval development of dextroscoliosis centered at the L3 level with associated 1.4 cm left to right translocation of the L4 vertebral body  relation to the L5 level. Suggestion of likely osseous neural foraminal stenosis. Multilevel moderate intervertebral disc space narrowing, most prominent at the L5-S1 level.   IMPRESSION: Interval development of  dextroscoliosis centered at the L3 level with associated 1.4 cm left to right translocation of the L4 vertebral body relation to the L5 level. Consider cross-sectional imaging for further evaluation.     Electronically Signed   By: Morgane  Naveau M.D.   On: 10/25/2023 00:19    DG Si Joints 10/24/2023  Narrative & Impression  CLINICAL DATA:  Left SI JT pain.   EXAM: BILATERAL SACROILIAC JOINTS - 3 VIEW   COMPARISON:  None Available.   FINDINGS: There is bilateral hip degenerative change with joint space narrowing and small osteophytes. Pelvic ring is intact. The sacroiliac joint spaces are maintained and there is no evidence of arthropathy. No acute fracture, dislocation or subluxation. No osteolytic or osteoblastic lesions.   IMPRESSION: Bilateral hip degenerative changes. No sacroiliac joint arthropathy identified. No acute osseous abnormalities.     Electronically Signed   By: Fonda Field M.D.   On: 10/24/2023 21:53           PATIENT SURVEYS:  Modified Oswestry 17/50 (34%), (12/30/2023)   COGNITION: Overall cognitive status: Within functional limits for tasks assessed     SENSATION:   MUSCLE LENGTH:   POSTURE: R weight shift, forward neck, thoracic kyphosis, decreased L LE weight bearing, R lateral shift, increased R lateral shift around L2/3 area.   Increased L LE L5 dermatome pain with manual R lateral shift correction.    PALPATION:   LUMBAR ROM:   AROM eval  Flexion Full, no pain  Extension Limited with reproduction of back and L LE pain   Right lateral flexion full  Left lateral flexion Limited with L lateral thigh pain  Right rotation WFL with L lateral thigh pain  Left rotation WFL with low Back pain   (Blank rows = not tested)  LOWER EXTREMITY ROM:     Passive  Right eval Left eval  Hip flexion    Hip extension    Hip abduction    Hip adduction    Hip internal rotation 33 12 with L posterior and anterior hip joint pain   Hip external rotation 64 60  Knee flexion    Knee extension    Ankle dorsiflexion    Ankle plantarflexion    Ankle inversion    Ankle eversion     (Blank rows = not tested)  LOWER EXTREMITY MMT:    MMT Right eval Left eval  Hip flexion 4 4- with L lateral posterior thigh pain  Hip extension (seated manually resisted) 3 3  Hip abduction (seated manually resisted) 4 4  Hip adduction    Hip internal rotation    Hip external rotation    Knee flexion 5 4  Knee extension 5 4+  Ankle dorsiflexion 5 5  Ankle plantarflexion    Ankle inversion    Ankle eversion     (Blank rows = not tested)  LUMBAR SPECIAL TESTS:  Long sit test suggests anterior nutation of L innominate.   FUNCTIONAL TESTS:    GAIT: Distance walked: 10 ft Assistive device utilized: Single point cane Level of assistance: Modified independence Comments: decreased stance L LE, antalgic, SPC on R  TREATMENT DATE: 01/28/2024  Neuromuscular re education  Performed to improve posture as well as lumbopelvic stability during closed chain tasks.    Sitting with upright posture               Gentle manually resisted L lateral shift isometrics in neutral to decrease L lumbar convexity                         10x5 seconds for 3 sets   L to R pressure with strap to L thoracolumbar convexity.  Feels more relaxed   Then with L trunk side bend 4x10 seconds. Feels B glute symptoms which eases with rest.    Then with just transversus abdominis contraction 10x with 5 second holds   Then 5x5 seconds for 3 sets (rest breaks secondary to fatigue)    Seated transversus abdominis contraction with B shoulder extension isometrics 10x5 seconds.  Then 5x5 seconds for 3 sets (rest breaks secondary to fatigue)  Seated B scapular retraction to promote thoracic extension to decrease stress to low back.    10x5 seconds     Improved exercise technique, movement at target joints, use of target muscles after mod verbal, visual, tactile cues.    PATIENT EDUCATION:  Education details: there-ex, HEP Person educated: Patient Education method: Explanation, Demonstration, Tactile cues, Verbal cues, Handouts, and interpreter Education comprehension: verbalized understanding and returned demonstration  HOME EXERCISE PROGRAM: Access Code: [email or web address] URL: [email or web address] Date: 01/01/2024 Prepared by: Emil Glassman  Exercises - Supine Transversus Abdominis Bracing - Hands on Stomach  - 3 x daily - 7 x weekly - 3 sets - 10 reps - 5 seconds hold - Supine Posterior Pelvic Tilt  - 1 x daily - 7 x weekly - 3 sets - 10 reps - 5 seconds hold  - Standing Hip Abduction with Counter Support  - 1 x daily - 7 x weekly - 3 sets - 10 reps - Standing Hip Extension with Counter Support  - 1 x daily - 7 x weekly - 3 sets - 10 reps  ASSESSMENT:  CLINICAL IMPRESSION: Continued working on improving thoracolumbar posture and trunk strength to decrease stress to low back and LE nerves. No low back and 1/10 L LE pain during gait at end of session. Therapeutic rest breaks provided secondary to fatigue.  Pt will benefit from continued skilled physical therapy services to overall decrease pain, improve strength and function.       OBJECTIVE IMPAIRMENTS: Abnormal gait, difficulty walking, decreased strength, improper body mechanics, postural dysfunction, and pain.   ACTIVITY LIMITATIONS: carrying, lifting, bending, sitting, standing, squatting, stairs, transfers, bed mobility, reach over head, and locomotion level  PARTICIPATION LIMITATIONS:   PERSONAL FACTORS: Fitness, Past/current experiences, Time since onset of injury/illness/exacerbation, and 3+ comorbidities: AML, anxiety, arthritis, DM, osteopenia, RA are also affecting patient's functional outcome.   REHAB POTENTIAL: Fair    CLINICAL  DECISION MAKING: Evolving/moderate complexity, pain is worsening since onset per pt  EVALUATION COMPLEXITY: Moderate   GOALS: Goals reviewed with patient? Yes  SHORT TERM GOALS: Target date: 01/09/2024  Pt will be independent with her initial HEP to decrease back pain, improve strength, and function. Baseline: Pt has not yet started her initial HEP (12/30/2023). Goal status: INITIAL   LONG TERM GOALS: Target date: 02/27/2024  PT will improve her Modified Oswestry score by at least 10% as a demonstration of improved function.  Baseline: Modified Oswestry 17/50 (34%), (12/30/2023)  Goal status: INITIAL  2.  Pt will have  a decrease in low back pain to 5/10 or less at worst to promote ability to ambulate as well as perform standing tasks more comfortably.  Baseline: Low back: 10+/10 at worst for the past 3 months. Pain sometimes hurt in the morning and cannot move (12/30/2023) Goal status: INITIAL  3.  Pt will have a decrease in L LE and R LE pain to 5/10 or less at worst to promote ability to ambulate as well as perform standing tasks more comfortably.  Baseline: L LE: 10+/10 at worst for the past 3 months, R LE: 8/10 at worst for the past 3 months. (12/30/2023) Goal status: INITIAL  4.  Pt will improve B hip strength by at least 1/2 MMT grade to promote ability to perform standing tasks as well as ambulate more comfortably for her back and B LE. Baseline:  MMT Right eval Left eval  Hip flexion 4 4- with L lateral posterior thigh pain  Hip extension (seated manually resisted) 3 3  Hip abduction (seated manually resisted) 4 4   Goal status: INITIAL   PLAN:  PT FREQUENCY: 1-2x/week  PT DURATION: 8 weeks  PLANNED INTERVENTIONS: 97110-Therapeutic exercises, 97530- Therapeutic activity, [id number]- Neuromuscular re-education, 97535- Self Care, [id number]- Manual therapy, [id number]- Gait training, [id number]- Aquatic Therapy, [phone]- Electrical stimulation (unattended), [id number]- Traction (mechanical),  [id number]- Ionotophoresis 4mg /ml Dexamethasone , Patient/Family education, and Stair training.  PLAN FOR NEXT SESSION: improve posture, trunk and glute strength, manual techniques, modalities PRN   Jenaro Souder, PT, DPT 01/28/2024, 9:14 AM

## 2024-02-02 ENCOUNTER — Ambulatory Visit

## 2024-02-02 DIAGNOSIS — M5459 Other low back pain: Secondary | ICD-10-CM

## 2024-02-02 DIAGNOSIS — M5431 Sciatica, right side: Secondary | ICD-10-CM

## 2024-02-02 DIAGNOSIS — M5432 Sciatica, left side: Secondary | ICD-10-CM

## 2024-02-02 DIAGNOSIS — M5416 Radiculopathy, lumbar region: Secondary | ICD-10-CM

## 2024-02-02 NOTE — Therapy (Signed)
 OUTPATIENT PHYSICAL THERAPY TREATMENT   Patient Name: TENEISHA GIGNAC MRN: [id number] DOB:16-Mar-1961, 63 y.o., female Today's Date: 02/02/2024  END OF SESSION:  PT End of Session - 02/02/24 0737     Visit Number 9    Number of Visits 17    Date for PT Re-Evaluation 02/27/24    PT Start Time 0737    PT Stop Time 0815    PT Time Calculation (min) 38 min    Activity Tolerance Patient tolerated treatment well    Behavior During Therapy [facility] for tasks assessed/performed                  Past Medical History:  Diagnosis Date   AML (acute myeloblastic leukemia) (HCC) 2007   s/p chemo (Dr Alto)   Anemia    hx of   Anxiety    Arthritis    severe   Constipation    COVID-19 virus infection 07/04/2021   Depression    Diabetes mellitus without complication (HCC)    Fatty liver    GERD (gastroesophageal reflux disease)    H/O Bell's palsy    H/O hiatal hernia    Headache    occasional   High triglycerides    History of cancer chemotherapy    Leukemia (HCC)    Morbidly obese (HCC)    Osteopenia 06/08/2017   DEXA 2017 - T -1.8 spine   Pneumonia    hx of    Rheumatoid arthritis (HCC)    Sleep apnea    Stroke (HCC)    mini stroke-caused bells palsy for 1 month   Past Surgical History:  Procedure Laterality Date   ABDOMINAL HYSTERECTOMY  2001   heavy bleeding, fibroids, 1 ovary remains   BLEPHAROPLASTY Bilateral 06/2020   in [place]   COLONOSCOPY WITH PROPOFOL  N/A 09/19/2020   TA, int hem, rpt 7 yrs Renne, Darren, MD)   FACIAL COSMETIC SURGERY  06/2020   in [place]   LAPAROSCOPIC GASTRIC BAND REMOVAL WITH LAPAROSCOPIC GASTRIC SLEEVE RESECTION  04/26/2014   LAPAROSCOPIC GASTRIC BANDING  2000   performed in [place]   REPLACEMENT TOTAL KNEE Left 10/2017   Dr D[facility]iss [facility]   REPLACEMENT TOTAL KNEE Right 06/2018   Dr Darliss Persons [facility]   TUBAL LIGATION  1996   UPPER GI ENDOSCOPY N/A 04/26/2014   Procedure: UPPER GI ENDOSCOPY;  Surgeon: Donnice KATHEE Lunger,  MD;  Location: [facility];  Service: General;  Laterality: N/A;   Patient Active Problem List   Diagnosis Date Noted   Chronic radicular lumbar pain 01/06/2024   Lumbar foraminal stenosis 01/06/2024   Abnormal x-ray of lumbar spine 10/25/2023   Abnormal urinalysis 10/25/2023   Generalized body aches 10/25/2023   Insulin  resistance syndrome 10/02/2023   Hyperinsulinemia 10/02/2023   Right wrist pain 10/02/2023   Gastroesophageal reflux disease without esophagitis 10/02/2023   Polyarthralgia 05/22/2023   Ex-smoker 03/12/2023   Osteoarthritis 12/10/2022   Insomnia 01/30/2022   Polyp of ascending colon    Recurrent respiratory infection 07/10/2020   Depression 07/10/2020   History of Bell's palsy 07/10/2020   History of colitis 07/10/2020   History of stroke 07/10/2020   Low serum vitamin B12 07/08/2020   Dysuria 06/28/2020   Dry mouth 06/28/2020   Verruca vulgaris 06/28/2020   Borderline hypothyroidism 04/08/2020   Encounter for general adult medical examination with abnormal findings 03/03/2018   NAFLD (nonalcoholic fatty liver disease) 91/79/7980   Osteopenia 06/08/2017   Trigeminal neuralgia 05/01/2017   Type 2  diabetes mellitus with other specified complication (HCC) 05/27/2016   Primary osteoarthritis of both knees 09/29/2015   Environmental and seasonal allergies 09/29/2015   Arthralgia of hands, bilateral 07/12/2015   Arthralgia of hip 07/12/2015   Degeneration of intervertebral disc of lumbar region with discogenic back pain and lower extremity pain 07/12/2015   Dyslipidemia 06/20/2015   Vitamin D  deficiency 06/20/2015   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   Obesity, morbid, BMI 40.0-49.9 (HCC) 01/10/2014   OSA (obstructive sleep apnea) 01/10/2014   Seasonal allergic rhinitis 09/30/2011   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   History of laparoscopic adjustable gastric banding 2000    PCP: Rilla Baller,  MD   REFERRING PROVIDER: Hilma Hastings, PA-C  REFERRING DIAG: [phone] (ICD-10-CM) - Lumbar spondylosis M43.16 (ICD-10-CM) - Spondylolisthesis of lumbar region M41.20 (ICD-10-CM) - Other idiopathic scoliosis, unspecified spinal region M54.16 (ICD-10-CM) - Lumbar radiculopathy M51.26 (ICD-10-CM) - HNP (herniated nucleus pulposus), lumbar  Rationale for Evaluation and Treatment: Rehabilitation  THERAPY DIAG:  Other low back pain  Radiculopathy, lumbar region  Sciatica, right side  Sciatica, left side  ONSET DATE: February 2025  SUBJECTIVE:                                                                                                                                                                                           SUBJECTIVE STATEMENT: Back is 2-3/10 currently. The legs after the therapy last Wednesday, pt was not able to move, had to be sitting all weekend because B knee joint pain (had B TKA). No LE radiating pain, just low back pain. Walking increases pain.      Interpreter present      PERTINENT HISTORY:  Low back pain. Feels like she does not need an interpreter. Pain started February 2025, got tests, was told that it was due to back bones. Was given medicine which has not helped. Pain has worsened since onset. Pain comes and goes. L LE bothers her (along L L5 dermatome, not past the knee), pt also feels pain at L hip joint. The R side also hurts some times (R L5 dermatome), not past the knee with numbness and pain. Going to get injections next week to see if that helps.   Unknown mechanism of injury, sudden onset L lateral thigh with gradual worsening of pain  Had something similar 4 years ago but the pain went away. This time, the pain has not gone away.   PLOF: was able to ambulate about 1.5 hours and do anything she wanted to.   Interpreter present. [facility])  Interpreter was used during the majority  of the session.   (Having interpreter during future  sessions is recommended based on the eval)  No blood pressure problems per pt.   LATEX ALLERGIES   PAIN:  Are you having pain? Yes: NPRS scale: 0/10 (pt currently sitting) Pain location: low back, and L > R LE at L5 dermatome, not past the knees.  Pain description: sharp, pins and needles, numbness Aggravating factors: sitting down, standing up, laying down, staying still in one position Relieving factors: movement, change positions, walk a little bit.   PRECAUTIONS: Osteopenia  RED FLAGS: Bowel or bladder incontinence: No and Cauda equina syndrome: No   WEIGHT BEARING RESTRICTIONS: No  FALLS:  Has patient fallen in last 6 months? No  LIVING ENVIRONMENT: Lives with: lives alone Lives in: House/apartment Stairs: Yes: Internal: 15 steps; on left going up and External: 6 steps; none Has following equipment at home: Single point cane and walker without wheels  OCCUPATION: Not working.   PLOF: Independent  PATIENT GOALS: walk normally or the best that she could without pain.   NEXT MD VISIT: August 2025  OBJECTIVE:  Note: Objective measures were completed at Evaluation unless otherwise noted.  DIAGNOSTIC FINDINGS:  MR Lumbar Spine Wo Contrast  11/19/2023  Narrative & Impression  CLINICAL DATA:  Lumbar radiculopathy with symptoms persisting over 6 weeks of treatment   EXAM: MRI LUMBAR SPINE WITHOUT CONTRAST   TECHNIQUE: Multiplanar, multisequence MR imaging of the lumbar spine was performed. No intravenous contrast was administered.   COMPARISON:  None Available.   FINDINGS: Segmentation:  Standard.   Alignment:  Trace scoliosis and mild L4-5 anterolisthesis   Vertebrae: No fracture, evidence of discitis, or bone lesion. Mild degenerative superior endplate edema at L4   Conus medullaris and cauda equina: Conus extends to the L1-2 level. Conus and cauda equina appear normal.   Paraspinal and other soft tissues: No perispinal mass or inflammation.   Disc  levels:   T12- L1: Disc bulging and right facet spurring.   L1-L2: Circumferential disc bulging.  Negative facets   L2-L3: Disc bulging with narrowing and desiccation. Small central protrusion. Mild degenerative facet spurring.   L3-L4: Disc narrowing and bulging with moderate facet spurring. Degeneration and epidural fat expansion causes moderate thecal sac stenosis, 6 mm in the midline. Left foraminal to extraforaminal herniation contacting the left L3 nerve root.   L4-L5: Disc narrowing and bulging with tiny central protrusion. Degenerative facet spurring with ligamentum flavum thickening greater on the right. Epidural fat and degeneration causes moderate thecal sac stenosis. Patent foramina.   L5-S1:Disc desiccation and narrowing with small central protrusion. There is a right foraminal protrusion up lifting the L5 nerve root. Degenerative facet spurring greater on the right.   IMPRESSION: Generalized lumbar spine degeneration with scoliosis.   Focal foraminal impingement on the right at L5-S1 mainly due to disc protrusion.   On the symptomatic left side there is a L3-4 left far-lateral herniation which could affect the left L3 nerve root.   Degeneration and epidural fat causes moderate thecal sac stenosis at L3-4 and L4-5.     Electronically Signed   By: Dorn Roulette M.D.   On: 12/05/2023 08:11   DG Lumbar Spine Complete 10/24/2023  Narrative & Impression  CLINICAL DATA:  left low back pain with radiculopathy x 6 + wks   EXAM: LUMBAR SPINE - COMPLETE 4+ VIEW   COMPARISON:  X-ray lumbar spine 07/12/2015   FINDINGS: There is no evidence of lumbar spine fracture. Interval worsening of  multilevel moderate degenerative changes of the spine. Interval development of dextroscoliosis centered at the L3 level with associated 1.4 cm left to right translocation of the L4 vertebral body relation to the L5 level. Suggestion of likely osseous neural foraminal stenosis.  Multilevel moderate intervertebral disc space narrowing, most prominent at the L5-S1 level.   IMPRESSION: Interval development of dextroscoliosis centered at the L3 level with associated 1.4 cm left to right translocation of the L4 vertebral body relation to the L5 level. Consider cross-sectional imaging for further evaluation.     Electronically Signed   By: Morgane  Naveau M.D.   On: 10/25/2023 00:19    DG Si Joints 10/24/2023  Narrative & Impression  CLINICAL DATA:  Left SI JT pain.   EXAM: BILATERAL SACROILIAC JOINTS - 3 VIEW   COMPARISON:  None Available.   FINDINGS: There is bilateral hip degenerative change with joint space narrowing and small osteophytes. Pelvic ring is intact. The sacroiliac joint spaces are maintained and there is no evidence of arthropathy. No acute fracture, dislocation or subluxation. No osteolytic or osteoblastic lesions.   IMPRESSION: Bilateral hip degenerative changes. No sacroiliac joint arthropathy identified. No acute osseous abnormalities.     Electronically Signed   By: Fonda Field M.D.   On: 10/24/2023 21:53           PATIENT SURVEYS:  Modified Oswestry 17/50 (34%), (12/30/2023)   COGNITION: Overall cognitive status: Within functional limits for tasks assessed     SENSATION:   MUSCLE LENGTH:   POSTURE: R weight shift, forward neck, thoracic kyphosis, decreased L LE weight bearing, R lateral shift, increased R lateral shift around L2/3 area.   Increased L LE L5 dermatome pain with manual R lateral shift correction.    PALPATION:   LUMBAR ROM:   AROM eval  Flexion Full, no pain  Extension Limited with reproduction of back and L LE pain   Right lateral flexion full  Left lateral flexion Limited with L lateral thigh pain  Right rotation WFL with L lateral thigh pain  Left rotation WFL with low Back pain   (Blank rows = not tested)  LOWER EXTREMITY ROM:     Passive  Right eval Left eval  Hip  flexion    Hip extension    Hip abduction    Hip adduction    Hip internal rotation 33 12 with L posterior and anterior hip joint pain  Hip external rotation 64 60  Knee flexion    Knee extension    Ankle dorsiflexion    Ankle plantarflexion    Ankle inversion    Ankle eversion     (Blank rows = not tested)  LOWER EXTREMITY MMT:    MMT Right eval Left eval  Hip flexion 4 4- with L lateral posterior thigh pain  Hip extension (seated manually resisted) 3 3  Hip abduction (seated manually resisted) 4 4  Hip adduction    Hip internal rotation    Hip external rotation    Knee flexion 5 4  Knee extension 5 4+  Ankle dorsiflexion 5 5  Ankle plantarflexion    Ankle inversion    Ankle eversion     (Blank rows = not tested)  LUMBAR SPECIAL TESTS:  Long sit test suggests anterior nutation of L innominate.   FUNCTIONAL TESTS:    GAIT: Distance walked: 10 ft Assistive device utilized: Single point cane Level of assistance: Modified independence Comments: decreased stance L LE, antalgic, SPC on R  TREATMENT DATE:  02/02/2024                                                                                                                                 Neuromuscular re education  Performed to improve posture as well as lumbopelvic stability during closed chain tasks.    Sitting with upright posture               Gentle manually resisted L lateral shift isometrics in neutral to decrease L lumbar convexity                         3x5 seconds. L posterior hip symptoms, slowly eases wth rest   Gentle manually resisted upper trunk rotation isometrics   To the L, 10x5 seconds for 3 sets   To the R 10x5 seconds for 3 sets    Gentle manually resisted trunk flexion isometrics in neutral 10x5 seconds    Chin tucks to promote upper thoracic extension and decrease stress to low back 5x5 seconds for 3 sets     Improved exercise technique, movement at target joints, use of  target muscles after mod verbal, visual, tactile cues.    PATIENT EDUCATION:  Education details: there-ex, HEP Person educated: Patient Education method: Explanation, Demonstration, Tactile cues, Verbal cues, Handouts, and interpreter Education comprehension: verbalized understanding and returned demonstration  HOME EXERCISE PROGRAM: Access Code: [email or web address] URL: [email or web address] Date: 01/01/2024 Prepared by: Emil Glassman  Exercises - Supine Transversus Abdominis Bracing - Hands on Stomach  - 3 x daily - 7 x weekly - 3 sets - 10 reps - 5 seconds hold - Supine Posterior Pelvic Tilt  - 1 x daily - 7 x weekly - 3 sets - 10 reps - 5 seconds hold  - Standing Hip Abduction with Counter Support  - 1 x daily - 7 x weekly - 3 sets - 10 reps - Standing Hip Extension with Counter Support  - 1 x daily - 7 x weekly - 3 sets - 10 reps - Seated Cervical Retraction  - 3 x daily - 7 x weekly - 3 sets - 5 reps - 5 seconds hold   ASSESSMENT:  CLINICAL IMPRESSION:  Continued working on improving thoracolumbar posture and trunk strength to decrease stress to low back and LE nerves. Decreased low back pain to 0/10, and L LE pain at 1/10 after session during gait. Pt will benefit from continued skilled physical therapy services to overall decrease pain, improve strength and function.       OBJECTIVE IMPAIRMENTS: Abnormal gait, difficulty walking, decreased strength, improper body mechanics, postural dysfunction, and pain.   ACTIVITY LIMITATIONS: carrying, lifting, bending, sitting, standing, squatting, stairs, transfers, bed mobility, reach over head, and locomotion level  PARTICIPATION LIMITATIONS:   PERSONAL FACTORS: Fitness, Past/current experiences, Time since onset of injury/illness/exacerbation, and 3+ comorbidities: AML, anxiety, arthritis, DM, osteopenia, RA are also affecting patient's functional outcome.   REHAB  POTENTIAL: Fair    CLINICAL DECISION MAKING:  Evolving/moderate complexity, pain is worsening since onset per pt  EVALUATION COMPLEXITY: Moderate   GOALS: Goals reviewed with patient? Yes  SHORT TERM GOALS: Target date: 01/09/2024  Pt will be independent with her initial HEP to decrease back pain, improve strength, and function. Baseline: Pt has not yet started her initial HEP (12/30/2023). No questions with her HEP (02/02/2024) Goal status: MET   LONG TERM GOALS: Target date: 02/27/2024  PT will improve her Modified Oswestry score by at least 10% as a demonstration of improved function.  Baseline: Modified Oswestry 17/50 (34%), (12/30/2023)  Goal status: INITIAL  2.  Pt will have a decrease in low back pain to 5/10 or less at worst to promote ability to ambulate as well as perform standing tasks more comfortably.  Baseline: Low back: 10+/10 at worst for the past 3 months. Pain sometimes hurt in the morning and cannot move (12/30/2023); 2-3/10 at most for the past 7 days (had injection recnetly) (02/02/2024) Goal status: PROGRESSING  3.  Pt will have a decrease in L LE and R LE pain to 5/10 or less at worst to promote ability to ambulate as well as perform standing tasks more comfortably.  Baseline: L LE: 10+/10 at worst for the past 3 months, R LE: 8/10 at worst for the past 3 months. (12/30/2023); L LE 2-3/10 and R LE 0/10 at worst for the past 7 days (pt had injection recently) (02/02/2024) Goal status: PROGRESSING  4.  Pt will improve B hip strength by at least 1/2 MMT grade to promote ability to perform standing tasks as well as ambulate more comfortably for her back and B LE. Baseline:  MMT Right eval Left eval  Hip flexion 4 4- with L lateral posterior thigh pain  Hip extension (seated manually resisted) 3 3  Hip abduction (seated manually resisted) 4 4   Goal status: INITIAL   PLAN:  PT FREQUENCY: 1-2x/week  PT DURATION: 8 weeks  PLANNED INTERVENTIONS: 97110-Therapeutic exercises, 97530- Therapeutic activity, [id number]-  Neuromuscular re-education, 97535- Self Care, [id number]- Manual therapy, [id number]- Gait training, [phone]- Aquatic Therapy, [phone]- Electrical stimulation (unattended), [id number]- Traction (mechanical), [id number]- Ionotophoresis 4mg /ml Dexamethasone , Patient/Family education, and Stair training.  PLAN FOR NEXT SESSION: improve posture, trunk and glute strength, manual techniques, modalities PRN   Rafay Dahan, PT, DPT 02/02/2024, 12:56 PM

## 2024-02-04 ENCOUNTER — Ambulatory Visit

## 2024-02-07 ENCOUNTER — Other Ambulatory Visit: Payer: Self-pay | Admitting: Pulmonary Disease

## 2024-02-07 DIAGNOSIS — J189 Pneumonia, unspecified organism: Secondary | ICD-10-CM

## 2024-02-10 ENCOUNTER — Ambulatory Visit

## 2024-02-10 DIAGNOSIS — M5459 Other low back pain: Secondary | ICD-10-CM | POA: Diagnosis not present

## 2024-02-10 DIAGNOSIS — M5432 Sciatica, left side: Secondary | ICD-10-CM

## 2024-02-10 DIAGNOSIS — R262 Difficulty in walking, not elsewhere classified: Secondary | ICD-10-CM

## 2024-02-10 DIAGNOSIS — M5416 Radiculopathy, lumbar region: Secondary | ICD-10-CM

## 2024-02-10 DIAGNOSIS — M5431 Sciatica, right side: Secondary | ICD-10-CM

## 2024-02-10 NOTE — Therapy (Signed)
 OUTPATIENT PHYSICAL THERAPY TREATMENT/PROGRESS NOTE Dates of Reporting Period: 12/30/23 - 02/10/24   Patient Name: Stacey Nichols MRN: [id number] DOB:08/20/1960, 63 y.o., female Today's Date: 02/10/2024  END OF SESSION:  PT End of Session - 02/10/24 0819     Visit Number 10    Number of Visits 17    Date for PT Re-Evaluation 02/27/24    PT Start Time 0816    PT Stop Time 0900    PT Time Calculation (min) 44 min    Activity Tolerance Patient tolerated treatment well    Behavior During Therapy [facility] for tasks assessed/performed                  Past Medical History:  Diagnosis Date   AML (acute myeloblastic leukemia) (HCC) 2007   s/p chemo (Dr Alto)   Anemia    hx of   Anxiety    Arthritis    severe   Constipation    COVID-19 virus infection 07/04/2021   Depression    Diabetes mellitus without complication (HCC)    Fatty liver    GERD (gastroesophageal reflux disease)    H/O Bell's palsy    H/O hiatal hernia    Headache    occasional   High triglycerides    History of cancer chemotherapy    Leukemia (HCC)    Morbidly obese (HCC)    Osteopenia 06/08/2017   DEXA 2017 - T -1.8 spine   Pneumonia    hx of    Rheumatoid arthritis (HCC)    Sleep apnea    Stroke (HCC)    mini stroke-caused bells palsy for 1 month   Past Surgical History:  Procedure Laterality Date   ABDOMINAL HYSTERECTOMY  2001   heavy bleeding, fibroids, 1 ovary remains   BLEPHAROPLASTY Bilateral 06/2020   in [place]   COLONOSCOPY WITH PROPOFOL  N/A 09/19/2020   TA, int hem, rpt 7 yrs Renne, Darren, MD)   FACIAL COSMETIC SURGERY  06/2020   in [place]   LAPAROSCOPIC GASTRIC BAND REMOVAL WITH LAPAROSCOPIC GASTRIC SLEEVE RESECTION  04/26/2014   LAPAROSCOPIC GASTRIC BANDING  2000   performed in [place]   REPLACEMENT TOTAL KNEE Left 10/2017   Dr Darliss [facility]   REPLACEMENT TOTAL KNEE Right 06/2018   Dr Darliss Persons [facility]   TUBAL LIGATION  1996   UPPER GI ENDOSCOPY N/A 04/26/2014    Procedure: UPPER GI ENDOSCOPY;  Surgeon: Donnice KATHEE Lunger, MD;  Location: [facility];  Service: General;  Laterality: N/A;   Patient Active Problem List   Diagnosis Date Noted   Chronic radicular lumbar pain 01/06/2024   Lumbar foraminal stenosis 01/06/2024   Abnormal x-ray of lumbar spine 10/25/2023   Abnormal urinalysis 10/25/2023   Generalized body aches 10/25/2023   Insulin  resistance syndrome 10/02/2023   Hyperinsulinemia 10/02/2023   Right wrist pain 10/02/2023   Gastroesophageal reflux disease without esophagitis 10/02/2023   Polyarthralgia 05/22/2023   Ex-smoker 03/12/2023   Osteoarthritis 12/10/2022   Insomnia 01/30/2022   Polyp of ascending colon    Recurrent respiratory infection 07/10/2020   Depression 07/10/2020   History of Bell's palsy 07/10/2020   History of colitis 07/10/2020   History of stroke 07/10/2020   Low serum vitamin B12 07/08/2020   Dysuria 06/28/2020   Dry mouth 06/28/2020   Verruca vulgaris 06/28/2020   Borderline hypothyroidism 04/08/2020   Encounter for general adult medical examination with abnormal findings 03/03/2018   NAFLD (nonalcoholic fatty liver disease) 91/79/7980   Osteopenia 06/08/2017  Trigeminal neuralgia 05/01/2017   Type 2 diabetes mellitus with other specified complication (HCC) 05/27/2016   Primary osteoarthritis of both knees 09/29/2015   Environmental and seasonal allergies 09/29/2015   Arthralgia of hands, bilateral 07/12/2015   Arthralgia of hip 07/12/2015   Degeneration of intervertebral disc of lumbar region with discogenic back pain and lower extremity pain 07/12/2015   Dyslipidemia 06/20/2015   Vitamin D  deficiency 06/20/2015   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   Obesity, morbid, BMI 40.0-49.9 (HCC) 01/10/2014   OSA (obstructive sleep apnea) 01/10/2014   Seasonal allergic rhinitis 09/30/2011   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   History of laparoscopic adjustable  gastric banding 2000    PCP: Rilla Baller, MD   REFERRING PROVIDER: Hilma Hastings, PA-C  REFERRING DIAG: [phone] (ICD-10-CM) - Lumbar spondylosis M43.16 (ICD-10-CM) - Spondylolisthesis of lumbar region M41.20 (ICD-10-CM) - Other idiopathic scoliosis, unspecified spinal region M54.16 (ICD-10-CM) - Lumbar radiculopathy M51.26 (ICD-10-CM) - HNP (herniated nucleus pulposus), lumbar  Rationale for Evaluation and Treatment: Rehabilitation  THERAPY DIAG:  Other low back pain  Radiculopathy, lumbar region  Sciatica, right side  Sciatica, left side  Difficulty in walking, not elsewhere classified  ONSET DATE: February 2025  SUBJECTIVE:                                                                                                                                                                                           SUBJECTIVE STATEMENT: No back pain at rest. Has back pain with ambulation still upwards to 5-6/10 NPS. Believes steroid injection has already worn off.  Interpreter present: Milly       PERTINENT HISTORY:  Low back pain. Feels like she does not need an interpreter. Pain started February 2025, got tests, was told that it was due to back bones. Was given medicine which has not helped. Pain has worsened since onset. Pain comes and goes. L LE bothers her (along L L5 dermatome, not past the knee), pt also feels pain at L hip joint. The R side also hurts some times (R L5 dermatome), not past the knee with numbness and pain. Going to get injections next week to see if that helps.   Unknown mechanism of injury, sudden onset L lateral thigh with gradual worsening of pain  Had something similar 4 years ago but the pain went away. This time, the pain has not gone away.   PLOF: was able to ambulate about 1.5 hours and do anything she wanted to.   Interpreter present. [facility])  Interpreter was used during the majority of the session.   (Having interpreter during  future  sessions is recommended based on the eval)  No blood pressure problems per pt.   LATEX ALLERGIES   PAIN:  Are you having pain? Yes: NPRS scale: 0/10 (pt currently sitting) Pain location: low back, and L > R LE at L5 dermatome, not past the knees.  Pain description: sharp, pins and needles, numbness Aggravating factors: sitting down, standing up, laying down, staying still in one position Relieving factors: movement, change positions, walk a little bit.   PRECAUTIONS: Osteopenia  RED FLAGS: Bowel or bladder incontinence: No and Cauda equina syndrome: No   WEIGHT BEARING RESTRICTIONS: No  FALLS:  Has patient fallen in last 6 months? No  LIVING ENVIRONMENT: Lives with: lives alone Lives in: House/apartment Stairs: Yes: Internal: 15 steps; on left going up and External: 6 steps; none Has following equipment at home: Single point cane and walker without wheels  OCCUPATION: Not working.   PLOF: Independent  PATIENT GOALS: walk normally or the best that she could without pain.   NEXT MD VISIT: August 2025  OBJECTIVE:  Note: Objective measures were completed at Evaluation unless otherwise noted.  DIAGNOSTIC FINDINGS:  MR Lumbar Spine Wo Contrast  11/19/2023  Narrative & Impression  CLINICAL DATA:  Lumbar radiculopathy with symptoms persisting over 6 weeks of treatment   EXAM: MRI LUMBAR SPINE WITHOUT CONTRAST   TECHNIQUE: Multiplanar, multisequence MR imaging of the lumbar spine was performed. No intravenous contrast was administered.   COMPARISON:  None Available.   FINDINGS: Segmentation:  Standard.   Alignment:  Trace scoliosis and mild L4-5 anterolisthesis   Vertebrae: No fracture, evidence of discitis, or bone lesion. Mild degenerative superior endplate edema at L4   Conus medullaris and cauda equina: Conus extends to the L1-2 level. Conus and cauda equina appear normal.   Paraspinal and other soft tissues: No perispinal mass or inflammation.   Disc  levels:   T12- L1: Disc bulging and right facet spurring.   L1-L2: Circumferential disc bulging.  Negative facets   L2-L3: Disc bulging with narrowing and desiccation. Small central protrusion. Mild degenerative facet spurring.   L3-L4: Disc narrowing and bulging with moderate facet spurring. Degeneration and epidural fat expansion causes moderate thecal sac stenosis, 6 mm in the midline. Left foraminal to extraforaminal herniation contacting the left L3 nerve root.   L4-L5: Disc narrowing and bulging with tiny central protrusion. Degenerative facet spurring with ligamentum flavum thickening greater on the right. Epidural fat and degeneration causes moderate thecal sac stenosis. Patent foramina.   L5-S1:Disc desiccation and narrowing with small central protrusion. There is a right foraminal protrusion up lifting the L5 nerve root. Degenerative facet spurring greater on the right.   IMPRESSION: Generalized lumbar spine degeneration with scoliosis.   Focal foraminal impingement on the right at L5-S1 mainly due to disc protrusion.   On the symptomatic left side there is a L3-4 left far-lateral herniation which could affect the left L3 nerve root.   Degeneration and epidural fat causes moderate thecal sac stenosis at L3-4 and L4-5.     Electronically Signed   By: Dorn Roulette M.D.   On: 12/05/2023 08:11   DG Lumbar Spine Complete 10/24/2023  Narrative & Impression  CLINICAL DATA:  left low back pain with radiculopathy x 6 + wks   EXAM: LUMBAR SPINE - COMPLETE 4+ VIEW   COMPARISON:  X-ray lumbar spine 07/12/2015   FINDINGS: There is no evidence of lumbar spine fracture. Interval worsening of multilevel moderate degenerative changes of the spine. Interval  development of dextroscoliosis centered at the L3 level with associated 1.4 cm left to right translocation of the L4 vertebral body relation to the L5 level. Suggestion of likely osseous neural foraminal stenosis.  Multilevel moderate intervertebral disc space narrowing, most prominent at the L5-S1 level.   IMPRESSION: Interval development of dextroscoliosis centered at the L3 level with associated 1.4 cm left to right translocation of the L4 vertebral body relation to the L5 level. Consider cross-sectional imaging for further evaluation.     Electronically Signed   By: Morgane  Naveau M.D.   On: 10/25/2023 00:19    DG Si Joints 10/24/2023  Narrative & Impression  CLINICAL DATA:  Left SI JT pain.   EXAM: BILATERAL SACROILIAC JOINTS - 3 VIEW   COMPARISON:  None Available.   FINDINGS: There is bilateral hip degenerative change with joint space narrowing and small osteophytes. Pelvic ring is intact. The sacroiliac joint spaces are maintained and there is no evidence of arthropathy. No acute fracture, dislocation or subluxation. No osteolytic or osteoblastic lesions.   IMPRESSION: Bilateral hip degenerative changes. No sacroiliac joint arthropathy identified. No acute osseous abnormalities.     Electronically Signed   By: Fonda Field M.D.   On: 10/24/2023 21:53           PATIENT SURVEYS:  Modified Oswestry 17/50 (34%), (12/30/2023)   COGNITION: Overall cognitive status: Within functional limits for tasks assessed     SENSATION:   MUSCLE LENGTH:   POSTURE: R weight shift, forward neck, thoracic kyphosis, decreased L LE weight bearing, R lateral shift, increased R lateral shift around L2/3 area.   Increased L LE L5 dermatome pain with manual R lateral shift correction.    PALPATION:   LUMBAR ROM:   AROM eval  Flexion Full, no pain  Extension Limited with reproduction of back and L LE pain   Right lateral flexion full  Left lateral flexion Limited with L lateral thigh pain  Right rotation WFL with L lateral thigh pain  Left rotation WFL with low Back pain   (Blank rows = not tested)  LOWER EXTREMITY ROM:     Passive  Right eval Left eval  Hip  flexion    Hip extension    Hip abduction    Hip adduction    Hip internal rotation 33 12 with L posterior and anterior hip joint pain  Hip external rotation 64 60  Knee flexion    Knee extension    Ankle dorsiflexion    Ankle plantarflexion    Ankle inversion    Ankle eversion     (Blank rows = not tested)  LOWER EXTREMITY MMT:    MMT Right eval Left eval  Hip flexion 4 4- with L lateral posterior thigh pain  Hip extension (seated manually resisted) 3 3  Hip abduction (seated manually resisted) 4 4  Hip adduction    Hip internal rotation    Hip external rotation    Knee flexion 5 4  Knee extension 5 4+  Ankle dorsiflexion 5 5  Ankle plantarflexion    Ankle inversion    Ankle eversion     (Blank rows = not tested)  LUMBAR SPECIAL TESTS:  Long sit test suggests anterior nutation of L innominate.   FUNCTIONAL TESTS:    GAIT: Distance walked: 10 ft Assistive device utilized: Single point cane Level of assistance: Modified independence Comments: decreased stance L LE, antalgic, SPC on R  TREATMENT DATE: 02/10/2024  There.Ex:   Nu-Step L1 for 5 min UE and LE use for lumbar and hip AROM warm up   Seated exercise   Cervical retractions: 2x10, 2-3 sec holds    Scap retractions: 3x12, GTB   Standing:    Hip abduction: x3 unable to perform more due to severe LLE pain.    Hook lying:    Bridge: x3, severe LLE pain    Posterior pelvic tilts: 2x10 reps   Posterior tilt + bridge: 2x10, no pain.    Clamshell with GTB: 3x10   Physical Performance:   Increased time completing mODI with interpreter due to non english speaking.     PATIENT EDUCATION:  Education details: there-ex, HEP Person educated: Patient Education method: Explanation, Demonstration, Tactile cues, Verbal cues, Handouts, and interpreter Education comprehension:  verbalized understanding and returned demonstration  HOME EXERCISE PROGRAM: Access Code: [email or web address] URL: [email or web address] Date: 01/01/2024 Prepared by: Emil Glassman  Exercises - Supine Transversus Abdominis Bracing - Hands on Stomach  - 3 x daily - 7 x weekly - 3 sets - 10 reps - 5 seconds hold - Supine Posterior Pelvic Tilt  - 1 x daily - 7 x weekly - 3 sets - 10 reps - 5 seconds hold  - Standing Hip Abduction with Counter Support  - 1 x daily - 7 x weekly - 3 sets - 10 reps - Standing Hip Extension with Counter Support  - 1 x daily - 7 x weekly - 3 sets - 10 reps - Seated Cervical Retraction  - 3 x daily - 7 x weekly - 3 sets - 5 reps - 5 seconds hold   ASSESSMENT:  CLINICAL IMPRESSION:  Pt on 10th visit warranting progress note. Pt has not made any progress towards goals with consistent LBP and LE pain with standing and walking ADL's and has regressed on her mODI survey. Pt unable to tolerate any standing exercises today due to reports of severe LBP and LLE pain resorting to bed level therex. Able to eliminate pain with posterior tilting with gluteal therex. Will continue to attempt to progress strengthening in functional positions in attempts to improve strength, gait, and overall pain levels for ADL's. Pt will benefit from continued skilled physical therapy services to overall decrease pain, improve strength and function.     OBJECTIVE IMPAIRMENTS: Abnormal gait, difficulty walking, decreased strength, improper body mechanics, postural dysfunction, and pain.   ACTIVITY LIMITATIONS: carrying, lifting, bending, sitting, standing, squatting, stairs, transfers, bed mobility, reach over head, and locomotion level  PARTICIPATION LIMITATIONS:   PERSONAL FACTORS: Fitness, Past/current experiences, Time since onset of injury/illness/exacerbation, and 3+ comorbidities: AML, anxiety, arthritis, DM, osteopenia, RA are also affecting patient's functional outcome.   REHAB  POTENTIAL: Fair    CLINICAL DECISION MAKING: Evolving/moderate complexity, pain is worsening since onset per pt  EVALUATION COMPLEXITY: Moderate   GOALS: Goals reviewed with patient? Yes  SHORT TERM GOALS: Target date: 01/09/2024  Pt will be independent with her initial HEP to decrease back pain, improve strength, and function. Baseline: Pt has not yet started her initial HEP (12/30/2023). No questions with her HEP (02/02/2024) Goal status: MET   LONG TERM GOALS: Target date: 02/27/2024  PT will improve her Modified Oswestry score by at least 10% as a demonstration of improved function.  Baseline: Modified Oswestry 17/50 (34%), (12/30/2023); 02/10/24: 21/50 = 42% Goal status: PROGRESSING  2.  Pt will have a decrease in low back pain to 5/10 or less at worst to promote ability to ambulate as  well as perform standing tasks more comfortably.  Baseline: Low back: 10+/10 at worst for the past 3 months. Pain sometimes hurt in the morning and cannot move (12/30/2023); 2-3/10 at most for the past 7 days (had injection recnetly) (02/02/2024); 02/10/24: 5-6/10 NPS with gait Goal status: PROGRESSING  3.  Pt will have a decrease in L LE and R LE pain to 5/10 or less at worst to promote ability to ambulate as well as perform standing tasks more comfortably.  Baseline: L LE: 10+/10 at worst for the past 3 months, R LE: 8/10 at worst for the past 3 months. (12/30/2023); L LE 2-3/10 and R LE 0/10 at worst for the past 7 days (pt had injection recently) (02/02/2024) Goal status: PROGRESSING  4.  Pt will improve B hip strength by at least 1/2 MMT grade to promote ability to perform standing tasks as well as ambulate more comfortably for her back and B LE. Baseline:  MMT Right eval Left eval  Hip flexion 4 4- with L lateral posterior thigh pain  Hip extension (seated manually resisted) 3 3  Hip abduction (seated manually resisted) 4 4   Goal status: INITIAL   PLAN:  PT FREQUENCY: 1-2x/week  PT  DURATION: 8 weeks  PLANNED INTERVENTIONS: 97110-Therapeutic exercises, 97530- Therapeutic activity, [id number]- Neuromuscular re-education, 97535- Self Care, [id number]- Manual therapy, [id number]- Gait training, [phone]- Aquatic Therapy, [phone]- Electrical stimulation (unattended), [id number]- Traction (mechanical), [id number]- Ionotophoresis 4mg /ml Dexamethasone , Patient/Family education, and Stair training.  PLAN FOR NEXT SESSION: improve posture, trunk and glute strength, manual techniques, modalities PRN   Dorina HERO. Fairly IV, PT, DPT Physical Therapist- [facility] 02/10/2024, 9:59 AM

## 2024-02-13 ENCOUNTER — Ambulatory Visit: Attending: Orthopedic Surgery

## 2024-02-13 DIAGNOSIS — M5431 Sciatica, right side: Secondary | ICD-10-CM | POA: Insufficient documentation

## 2024-02-13 DIAGNOSIS — M5459 Other low back pain: Secondary | ICD-10-CM | POA: Diagnosis not present

## 2024-02-13 DIAGNOSIS — M5416 Radiculopathy, lumbar region: Secondary | ICD-10-CM | POA: Diagnosis not present

## 2024-02-13 DIAGNOSIS — M5432 Sciatica, left side: Secondary | ICD-10-CM | POA: Insufficient documentation

## 2024-02-13 DIAGNOSIS — R262 Difficulty in walking, not elsewhere classified: Secondary | ICD-10-CM | POA: Insufficient documentation

## 2024-02-13 NOTE — Therapy (Signed)
 OUTPATIENT PHYSICAL THERAPY TREATMENT/RECERT   Patient Name: Stacey Nichols MRN: [id number] DOB:13-Oct-1960, 63 y.o., female Today's Date: 02/13/2024  END OF SESSION:  PT End of Session - 02/13/24 0820     Visit Number 11    Number of Visits 17    Date for PT Re-Evaluation 03/12/24    PT Start Time 0817    PT Stop Time 0900    PT Time Calculation (min) 43 min    Activity Tolerance Patient tolerated treatment well    Behavior During Therapy [facility] for tasks assessed/performed                  Past Medical History:  Diagnosis Date   AML (acute myeloblastic leukemia) (HCC) 2007   s/p chemo (Dr Alto)   Anemia    hx of   Anxiety    Arthritis    severe   Constipation    COVID-19 virus infection 07/04/2021   Depression    Diabetes mellitus without complication (HCC)    Fatty liver    GERD (gastroesophageal reflux disease)    H/O Bell's palsy    H/O hiatal hernia    Headache    occasional   High triglycerides    History of cancer chemotherapy    Leukemia (HCC)    Morbidly obese (HCC)    Osteopenia 06/08/2017   DEXA 2017 - T -1.8 spine   Pneumonia    hx of    Rheumatoid arthritis (HCC)    Sleep apnea    Stroke (HCC)    mini stroke-caused bells palsy for 1 month   Past Surgical History:  Procedure Laterality Date   ABDOMINAL HYSTERECTOMY  2001   heavy bleeding, fibroids, 1 ovary remains   BLEPHAROPLASTY Bilateral 06/2020   in [place]   COLONOSCOPY WITH PROPOFOL  N/A 09/19/2020   TA, int hem, rpt 7 yrs Renne, Darren, MD)   FACIAL COSMETIC SURGERY  06/2020   in [place]   LAPAROSCOPIC GASTRIC BAND REMOVAL WITH LAPAROSCOPIC GASTRIC SLEEVE RESECTION  04/26/2014   LAPAROSCOPIC GASTRIC BANDING  2000   performed in [place]   REPLACEMENT TOTAL KNEE Left 10/2017   Dr Darliss [facility]ons [facility]   REPLACEMENT TOTAL KNEE Right 06/2018   Dr Darliss Persons [facility]   TUBAL LIGATION  1996   UPPER GI ENDOSCOPY N/A 04/26/2014   Procedure: UPPER GI ENDOSCOPY;  Surgeon: Donnice KATHEE Lunger, MD;  Location: [facility];  Service: General;  Laterality: N/A;   Patient Active Problem List   Diagnosis Date Noted   Chronic radicular lumbar pain 01/06/2024   Lumbar foraminal stenosis 01/06/2024   Abnormal x-ray of lumbar spine 10/25/2023   Abnormal urinalysis 10/25/2023   Generalized body aches 10/25/2023   Insulin  resistance syndrome 10/02/2023   Hyperinsulinemia 10/02/2023   Right wrist pain 10/02/2023   Gastroesophageal reflux disease without esophagitis 10/02/2023   Polyarthralgia 05/22/2023   Ex-smoker 03/12/2023   Osteoarthritis 12/10/2022   Insomnia 01/30/2022   Polyp of ascending colon    Recurrent respiratory infection 07/10/2020   Depression 07/10/2020   History of Bell's palsy 07/10/2020   History of colitis 07/10/2020   History of stroke 07/10/2020   Low serum vitamin B12 07/08/2020   Dysuria 06/28/2020   Dry mouth 06/28/2020   Verruca vulgaris 06/28/2020   Borderline hypothyroidism 04/08/2020   Encounter for general adult medical examination with abnormal findings 03/03/2018   NAFLD (nonalcoholic fatty liver disease) 91/79/7980   Osteopenia 06/08/2017   Trigeminal neuralgia 05/01/2017   Type 2  diabetes mellitus with other specified complication (HCC) 05/27/2016   Primary osteoarthritis of both knees 09/29/2015   Environmental and seasonal allergies 09/29/2015   Arthralgia of hands, bilateral 07/12/2015   Arthralgia of hip 07/12/2015   Degeneration of intervertebral disc of lumbar region with discogenic back pain and lower extremity pain 07/12/2015   Dyslipidemia 06/20/2015   Vitamin D  deficiency 06/20/2015   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   Obesity, morbid, BMI 40.0-49.9 (HCC) 01/10/2014   OSA (obstructive sleep apnea) 01/10/2014   Seasonal allergic rhinitis 09/30/2011   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   History of laparoscopic adjustable gastric banding 2000    PCP: Rilla Baller,  MD   REFERRING PROVIDER: Hilma Hastings, PA-C  REFERRING DIAG: [phone] (ICD-10-CM) - Lumbar spondylosis M43.16 (ICD-10-CM) - Spondylolisthesis of lumbar region M41.20 (ICD-10-CM) - Other idiopathic scoliosis, unspecified spinal region M54.16 (ICD-10-CM) - Lumbar radiculopathy M51.26 (ICD-10-CM) - HNP (herniated nucleus pulposus), lumbar  Rationale for Evaluation and Treatment: Rehabilitation  THERAPY DIAG:  Other low back pain  Radiculopathy, lumbar region  Sciatica, right side  Sciatica, left side  Difficulty in walking, not elsewhere classified  ONSET DATE: February 2025  SUBJECTIVE:                                                                                                                                                                                           SUBJECTIVE STATEMENT: Pt with slightly worse LBP today. Had worsening symptoms after last session.   Interpreter present      PERTINENT HISTORY:  Low back pain. Feels like she does not need an interpreter. Pain started February 2025, got tests, was told that it was due to back bones. Was given medicine which has not helped. Pain has worsened since onset. Pain comes and goes. L LE bothers her (along L L5 dermatome, not past the knee), pt also feels pain at L hip joint. The R side also hurts some times (R L5 dermatome), not past the knee with numbness and pain. Going to get injections next week to see if that helps.   Unknown mechanism of injury, sudden onset L lateral thigh with gradual worsening of pain  Had something similar 4 years ago but the pain went away. This time, the pain has not gone away.   PLOF: was able to ambulate about 1.5 hours and do anything she wanted to.   Interpreter present. [facility])  Interpreter was used during the majority of the session.   (Having interpreter during future sessions is recommended based on the eval)  No blood pressure problems per pt.   LATEX  ALLERGIES   PAIN:   Are you having pain? Yes: NPRS scale: 0/10 (pt currently sitting) Pain location: low back, and L > R LE at L5 dermatome, not past the knees.  Pain description: sharp, pins and needles, numbness Aggravating factors: sitting down, standing up, laying down, staying still in one position Relieving factors: movement, change positions, walk a little bit.   PRECAUTIONS: Osteopenia  RED FLAGS: Bowel or bladder incontinence: No and Cauda equina syndrome: No   WEIGHT BEARING RESTRICTIONS: No  FALLS:  Has patient fallen in last 6 months? No  LIVING ENVIRONMENT: Lives with: lives alone Lives in: House/apartment Stairs: Yes: Internal: 15 steps; on left going up and External: 6 steps; none Has following equipment at home: Single point cane and walker without wheels  OCCUPATION: Not working.   PLOF: Independent  PATIENT GOALS: walk normally or the best that she could without pain.   NEXT MD VISIT: August 2025  OBJECTIVE:  Note: Objective measures were completed at Evaluation unless otherwise noted.  DIAGNOSTIC FINDINGS:  MR Lumbar Spine Wo Contrast  11/19/2023  Narrative & Impression  CLINICAL DATA:  Lumbar radiculopathy with symptoms persisting over 6 weeks of treatment   EXAM: MRI LUMBAR SPINE WITHOUT CONTRAST   TECHNIQUE: Multiplanar, multisequence MR imaging of the lumbar spine was performed. No intravenous contrast was administered.   COMPARISON:  None Available.   FINDINGS: Segmentation:  Standard.   Alignment:  Trace scoliosis and mild L4-5 anterolisthesis   Vertebrae: No fracture, evidence of discitis, or bone lesion. Mild degenerative superior endplate edema at L4   Conus medullaris and cauda equina: Conus extends to the L1-2 level. Conus and cauda equina appear normal.   Paraspinal and other soft tissues: No perispinal mass or inflammation.   Disc levels:   T12- L1: Disc bulging and right facet spurring.   L1-L2: Circumferential disc bulging.  Negative  facets   L2-L3: Disc bulging with narrowing and desiccation. Small central protrusion. Mild degenerative facet spurring.   L3-L4: Disc narrowing and bulging with moderate facet spurring. Degeneration and epidural fat expansion causes moderate thecal sac stenosis, 6 mm in the midline. Left foraminal to extraforaminal herniation contacting the left L3 nerve root.   L4-L5: Disc narrowing and bulging with tiny central protrusion. Degenerative facet spurring with ligamentum flavum thickening greater on the right. Epidural fat and degeneration causes moderate thecal sac stenosis. Patent foramina.   L5-S1:Disc desiccation and narrowing with small central protrusion. There is a right foraminal protrusion up lifting the L5 nerve root. Degenerative facet spurring greater on the right.   IMPRESSION: Generalized lumbar spine degeneration with scoliosis.   Focal foraminal impingement on the right at L5-S1 mainly due to disc protrusion.   On the symptomatic left side there is a L3-4 left far-lateral herniation which could affect the left L3 nerve root.   Degeneration and epidural fat causes moderate thecal sac stenosis at L3-4 and L4-5.     Electronically Signed   By: Dorn Roulette M.D.   On: 12/05/2023 08:11   DG Lumbar Spine Complete 10/24/2023  Narrative & Impression  CLINICAL DATA:  left low back pain with radiculopathy x 6 + wks   EXAM: LUMBAR SPINE - COMPLETE 4+ VIEW   COMPARISON:  X-ray lumbar spine 07/12/2015   FINDINGS: There is no evidence of lumbar spine fracture. Interval worsening of multilevel moderate degenerative changes of the spine. Interval development of dextroscoliosis centered at the L3 level with associated 1.4 cm left to right translocation of the  L4 vertebral body relation to the L5 level. Suggestion of likely osseous neural foraminal stenosis. Multilevel moderate intervertebral disc space narrowing, most prominent at the L5-S1 level.    IMPRESSION: Interval development of dextroscoliosis centered at the L3 level with associated 1.4 cm left to right translocation of the L4 vertebral body relation to the L5 level. Consider cross-sectional imaging for further evaluation.     Electronically Signed   By: Morgane  Naveau M.D.   On: 10/25/2023 00:19    DG Si Joints 10/24/2023  Narrative & Impression  CLINICAL DATA:  Left SI JT pain.   EXAM: BILATERAL SACROILIAC JOINTS - 3 VIEW   COMPARISON:  None Available.   FINDINGS: There is bilateral hip degenerative change with joint space narrowing and small osteophytes. Pelvic ring is intact. The sacroiliac joint spaces are maintained and there is no evidence of arthropathy. No acute fracture, dislocation or subluxation. No osteolytic or osteoblastic lesions.   IMPRESSION: Bilateral hip degenerative changes. No sacroiliac joint arthropathy identified. No acute osseous abnormalities.     Electronically Signed   By: Fonda Field M.D.   On: 10/24/2023 21:53           PATIENT SURVEYS:  Modified Oswestry 17/50 (34%), (12/30/2023)   COGNITION: Overall cognitive status: Within functional limits for tasks assessed     SENSATION:   MUSCLE LENGTH:   POSTURE: R weight shift, forward neck, thoracic kyphosis, decreased L LE weight bearing, R lateral shift, increased R lateral shift around L2/3 area.   Increased L LE L5 dermatome pain with manual R lateral shift correction.    PALPATION:   LUMBAR ROM:   AROM eval  Flexion Full, no pain  Extension Limited with reproduction of back and L LE pain   Right lateral flexion full  Left lateral flexion Limited with L lateral thigh pain  Right rotation WFL with L lateral thigh pain  Left rotation WFL with low Back pain   (Blank rows = not tested)  LOWER EXTREMITY ROM:     Passive  Right eval Left eval  Hip flexion    Hip extension    Hip abduction    Hip adduction    Hip internal rotation 33 12 with L  posterior and anterior hip joint pain  Hip external rotation 64 60  Knee flexion    Knee extension    Ankle dorsiflexion    Ankle plantarflexion    Ankle inversion    Ankle eversion     (Blank rows = not tested)  LOWER EXTREMITY MMT:    MMT Right eval Left eval  Hip flexion 4 4- with L lateral posterior thigh pain  Hip extension (seated manually resisted) 3 3  Hip abduction (seated manually resisted) 4 4  Hip adduction    Hip internal rotation    Hip external rotation    Knee flexion 5 4  Knee extension 5 4+  Ankle dorsiflexion 5 5  Ankle plantarflexion    Ankle inversion    Ankle eversion     (Blank rows = not tested)  LUMBAR SPECIAL TESTS:  Long sit test suggests anterior nutation of L innominate.   FUNCTIONAL TESTS:    GAIT: Distance walked: 10 ft Assistive device utilized: Single point cane Level of assistance: Modified independence Comments: decreased stance L LE, antalgic, SPC on R  TREATMENT DATE: 02/13/2024  There.Ex:   Nu-Step L0 for 5 min UE and LE use for lumbar and hip AROM warm up   Seated exercise   Cervical retractions: 2x10, 2-3 sec holds    Scap retractions: 3x12, blue TB   Hook lying:    Posterior pelvic tilts: 2x10 reps   Posterior tilt + bridge: 2x10, no pain.    Clamshell with blue TB: 3x12   LTR's: 2x12/side in pain free ranges    PROM hamstring stretch with knee extended: 2x30 sec/side    SKTC: LLE: 3x30 sec   LLE figure 4 stretch: 2x30    PATIENT EDUCATION:  Education details: there-ex, HEP Person educated: Patient Education method: Explanation, Demonstration, Tactile cues, Verbal cues, Handouts, and interpreter Education comprehension: verbalized understanding and returned demonstration  HOME EXERCISE PROGRAM: Access Code: [email or web address] URL: [email or web address] Date: 01/01/2024 Prepared by:  Emil Glassman  Exercises - Supine Transversus Abdominis Bracing - Hands on Stomach  - 3 x daily - 7 x weekly - 3 sets - 10 reps - 5 seconds hold - Supine Posterior Pelvic Tilt  - 1 x daily - 7 x weekly - 3 sets - 10 reps - 5 seconds hold  - Standing Hip Abduction with Counter Support  - 1 x daily - 7 x weekly - 3 sets - 10 reps - Standing Hip Extension with Counter Support  - 1 x daily - 7 x weekly - 3 sets - 10 reps - Seated Cervical Retraction  - 3 x daily - 7 x weekly - 3 sets - 5 reps - 5 seconds hold   ASSESSMENT:  CLINICAL IMPRESSION:  Insurance auth expiring this POC. PT updated goals last session for progress note. Pt remains with significant LBP and worsening disability as evidenced by mODI. Pt agreeable to trialing additional month of PT 2x/week with MD f/u to assess plans if pt continues to not make progress with PT in current POC. Pt will benefit from continued skilled physical therapy services to overall decrease pain, improve strength and function.       OBJECTIVE IMPAIRMENTS: Abnormal gait, difficulty walking, decreased strength, improper body mechanics, postural dysfunction, and pain.   ACTIVITY LIMITATIONS: carrying, lifting, bending, sitting, standing, squatting, stairs, transfers, bed mobility, reach over head, and locomotion level  PARTICIPATION LIMITATIONS:   PERSONAL FACTORS: Fitness, Past/current experiences, Time since onset of injury/illness/exacerbation, and 3+ comorbidities: AML, anxiety, arthritis, DM, osteopenia, RA are also affecting patient's functional outcome.   REHAB POTENTIAL: Fair    CLINICAL DECISION MAKING: Evolving/moderate complexity, pain is worsening since onset per pt  EVALUATION COMPLEXITY: Moderate   GOALS: Goals reviewed with patient? Yes  SHORT TERM GOALS: Target date: 01/09/2024  Pt will be independent with her initial HEP to decrease back pain, improve strength, and function. Baseline: Pt has not yet started her initial HEP  (12/30/2023). No questions with her HEP (02/02/2024) Goal status: MET   LONG TERM GOALS: Target date: 03/12/2024  PT will improve her Modified Oswestry score by at least 10% as a demonstration of improved function.  Baseline: Modified Oswestry 17/50 (34%), (12/30/2023); 02/10/24: 21/50 = 42% Goal status: PROGRESSING  2.  Pt will have a decrease in low back pain to 5/10 or less at worst to promote ability to ambulate as well as perform standing tasks more comfortably.  Baseline: Low back: 10+/10 at worst for the past 3 months. Pain sometimes hurt in the morning and cannot move (12/30/2023); 2-3/10 at most for the past 7 days (had injection recnetly) (02/02/2024); 02/10/24: 5-6/10  NPS with gait Goal status: PROGRESSING  3.  Pt will have a decrease in L LE and R LE pain to 5/10 or less at worst to promote ability to ambulate as well as perform standing tasks more comfortably.  Baseline: L LE: 10+/10 at worst for the past 3 months, R LE: 8/10 at worst for the past 3 months. (12/30/2023); L LE 2-3/10 and R LE 0/10 at worst for the past 7 days (pt had injection recently) (02/02/2024) Goal status: PROGRESSING  4.  Pt will improve B hip strength by at least 1/2 MMT grade to promote ability to perform standing tasks as well as ambulate more comfortably for her back and B LE. Baseline:  MMT Right eval Left eval  Hip flexion 4 4- with L lateral posterior thigh pain  Hip extension (seated manually resisted) 3 3  Hip abduction (seated manually resisted) 4 4   Goal status: INITIAL   PLAN:  PT FREQUENCY: 1-2x/week  PT DURATION: 4 weeks  PLANNED INTERVENTIONS: 97110-Therapeutic exercises, 97530- Therapeutic activity, [id number]- Neuromuscular re-education, 97535- Self Care, [id number]- Manual therapy, [id number]- Gait training, [phone]- Aquatic Therapy, [phone]- Electrical stimulation (unattended), [id number]- Traction (mechanical), [id number]- Ionotophoresis 4mg /ml Dexamethasone , Patient/Family education, and Stair training.  PLAN  FOR NEXT SESSION: improve posture, trunk and glute strength, manual techniques, modalities PRN   Dorina HERO. Fairly IV, PT, DPT Physical Therapist- [place]  [facility] 02/13/2024, 11:11 AM

## 2024-02-17 ENCOUNTER — Ambulatory Visit

## 2024-02-17 DIAGNOSIS — M5432 Sciatica, left side: Secondary | ICD-10-CM

## 2024-02-17 DIAGNOSIS — M5416 Radiculopathy, lumbar region: Secondary | ICD-10-CM

## 2024-02-17 DIAGNOSIS — M5431 Sciatica, right side: Secondary | ICD-10-CM | POA: Diagnosis not present

## 2024-02-17 DIAGNOSIS — M5459 Other low back pain: Secondary | ICD-10-CM

## 2024-02-17 DIAGNOSIS — R262 Difficulty in walking, not elsewhere classified: Secondary | ICD-10-CM

## 2024-02-17 NOTE — Progress Notes (Signed)
 Referring Physician:  Rilla Baller, MD [address] [place],  [place] [address]  Primary Physician:  Rilla Baller, MD  Interpreter used as patient speaks spanish.   History of Present Illness: [name] has a history of OSA, NAFLD, GERD, osteopenia, history of gastric banding surgery and gastric sleeve, dyslipidemia, AML in remission, depression, prediabetes, history of stroke, ?FM.    Interpreter used as patient speaks spanish.   Last seen by me on 12/22/23 for back and bilateral leg pain. 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 She has far lateral disc on left at L3-L4 that affects left L3 nerve along with moderate central stenosis L3-L5, and right foraminal disc L5-S1 with moderate right foraminal stenosis.   She was given dose pack and flexeril . She was sent to PT at [facility] and to pain management to consider injections.   She is here for follow up.   She is in PT at [facility]. She had left L3 injection with Dr. Marcelino on 01/21/24 and 02/25/24.   She does not feel PT has helped- has 2 visits left. She has seen improvement with the injections. She has no leg pain or numbness, but still has intermittent weakness. Her LBP has improved with injection, it is still present but much less. She has intermittent shocking pains in her lower back.   She is taking tylenol  and naproxen .   She does not smoke.   Bowel/Bladder Dysfunction: none  Conservative measures:  Physical therapy: initial eval at [facility] on 12/30/23 with 14 visits through 03/01/24 Multimodal medical therapy including regular antiinflammatories: naproxen , Robaxin , Tylenol , flexeril  Injections:  Left L3 TF ESI 02/25/24 Left L3 ESI on 01/21/24  Past Surgery: none  Stacey Nichols has no symptoms of cervical myelopathy.   The symptoms are causing a significant impact on the patient's life.   Review of Systems:  A 10 point review of systems is negative,  except for the pertinent positives and negatives detailed in the HPI.  Past Medical History: Past Medical History:  Diagnosis Date   AML (acute myeloblastic leukemia) (HCC) 2007   s/p chemo (Dr Alto)   Anemia    hx of   Anxiety    Arthritis    severe   Constipation    COVID-19 virus infection 07/04/2021   Depression    Diabetes mellitus without complication (HCC)    Fatty liver    GERD (gastroesophageal reflux disease)    H/O Bell's palsy    H/O hiatal hernia    Headache    occasional   High triglycerides    History of cancer chemotherapy    Leukemia (HCC)    Morbidly obese (HCC)    Osteopenia 06/08/2017   DEXA 2017 - T -1.8 spine   Pneumonia    hx of    Rheumatoid arthritis (HCC)    Sleep apnea    Stroke (HCC)    mini stroke-caused bells palsy for 1 month    Past Surgical History: Past Surgical History:  Procedure Laterality Date   ABDOMINAL HYSTERECTOMY  2001   heavy bleeding, fibroids, 1 ovary remains   BLEPHAROPLASTY Bilateral 06/2020   in [place]   COLONOSCOPY WITH PROPOFOL  N/A 09/19/2020   TA, int hem, rpt 7 yrs Renne, Darren, MD)   FACIAL COSMETIC SURGERY  06/2020   in [place]   LAPAROSCOPIC GASTRIC BAND REMOVAL WITH LAPAROSCOPIC GASTRIC SLEEVE RESECTION  04/26/2014   LAPAROSCOPIC GASTRIC BANDING  2000  performed in [place]   REPLACEMENT TOTAL KNEE Left 10/2017   Dr [facility]   REPLACEMENT TOTAL KNEE Right 06/2018   Dr D[facility]   TUBAL LIGATION  1996   UPPER GI ENDOSCOPY N/A 04/26/2014   Procedure: UPPER GI ENDOSCOPY;  Surgeon: Donnice KATHEE Lunger, MD;  Location: [facility];  Service: General;  Laterality: N/A;    Allergies: Allergies as of 03/01/2024 - Review Complete 03/01/2024  Allergen Reaction Noted   Latex Other (See Comments) and Itching 01/10/2014   Sulfa antibiotics Rash 09/28/2006   Tape Rash 06/20/2015    Medications: Outpatient Encounter Medications as of 03/01/2024  Medication Sig   acetaminophen  (TYLENOL ) 500 MG  tablet Take 500 mg by mouth every 6 (six) hours as needed.   albuterol  (VENTOLIN  HFA) 108 (90 Base) MCG/ACT inhaler INHALE 2 PUFFS INTO THE LUNGS EVERY 6 (SIX) HOURS AS NEEDED FOR WHEEZING OR SHORTNESS OF BREATH. PLEASE SCHEDULE OFFICE VISIT BEFORE ANY FUTURE REFILLS.   aspirin 81 MG tablet Take 81 mg by mouth daily.   b complex vitamins capsule Take 1 capsule by mouth once a week.   Biotin  5 MG CAPS Take 1 capsule (5 mg total) by mouth daily. (Patient taking differently: Take 1 capsule by mouth every other day.)   Cholecalciferol  1.25 MG (50000 UT) TABS Take 1 tablet by mouth once a week.   metFORMIN  (GLUCOPHAGE ) 500 MG tablet Take 1 tablet (500 mg total) by mouth daily with breakfast.   Multiple Vitamin (MULTIVITAMIN) tablet Take 1 tablet by mouth daily.   naproxen  (EC NAPROSYN ) 500 MG EC tablet Take 500 mg by mouth 2 (two) times daily with a meal.   pantoprazole  (PROTONIX ) 40 MG tablet Take 1 tablet (40 mg total) by mouth daily.   rosuvastatin  (CRESTOR ) 10 MG tablet Take 1 tablet (10 mg total) by mouth 2 (two) times a week.   [DISCONTINUED] cyclobenzaprine  (FLEXERIL ) 10 MG tablet TAKE 1 TABLET (10 MG TOTAL) BY MOUTH 3 (THREE) TIMES DAILY AS NEEDED FOR MUSCLE SPASMS. THIS CAN MAKE YOU SLEEPY.   [DISCONTINUED] methylPREDNISolone  (MEDROL  DOSEPAK) 4 MG TBPK tablet Use as directed x 6 days. (Patient not taking: Reported on 02/25/2024)   [DISCONTINUED] RESTASIS 0.05 % ophthalmic emulsion  (Patient taking differently: Place 1 drop into both eyes in the morning, at noon, and at bedtime.)   [DISCONTINUED] tirzepatide  (MOUNJARO ) 2.5 MG/0.5ML Pen Inject 2.5 mg into the skin once a week.   [DISCONTINUED] tirzepatide  (MOUNJARO ) 5 MG/0.5ML Pen Inject 5 mg into the skin once a week.   [DISCONTINUED] traZODone  (DESYREL ) 50 MG tablet TAKE 1/2 TO 1 TABLET BY MOUTH AT BEDTIME AS NEEDED FOR SLEEP   No facility-administered encounter medications on file as of 03/01/2024.    Social History: Social History    Tobacco Use   Smoking status: Former    Current packs/day: 0.00    Types: Cigarettes    Start date: 03/30/1988    Quit date: 03/30/2018    Years since quitting: 5.9   Smokeless tobacco: Never  Vaping Use   Vaping status: Never Used  Substance Use Topics   Alcohol use: Yes    Comment: occasional   Drug use: No    Family Medical History: Family History  Problem Relation Age of Onset   Cancer Mother        cervix   Hyperlipidemia Father    Obesity Father    CAD Father        MI and arrhythmia   Heart  Problems Father    Cancer Sister        metastatic, ?pancrease primary   Diabetes Maternal Grandfather    Breast cancer Neg Hx     Physical Examination: Vitals:   03/01/24 0928  BP: 120/78      Awake, alert, oriented to person, place, and time.  Speech is clear and fluent. Fund of knowledge is appropriate.   Cranial Nerves: Pupils equal round and reactive to light.  Facial tone is symmetric.    No abnormal lesions on exposed skin.   Strength: Side Iliopsoas Quads Hamstring PF DF EHL  R 5 5 5 5 5 5   L 5 5 5 5 5 5    Reflexes are 2+ and symmetric at the patella and achilles.    Clonus is not present.   Bilateral lower extremity sensation is intact to light touch.     She has no pain with IR/ER of both hips.   She ambulates slowly. No cane today.   Medical Decision Making  Imaging: none  Assessment and Plan: Ms. Peth has seen improvement with PT and injections (she thinks injections helped more).   She still has constant LBP, but it is improved. Her bilateral leg pain is gone. She still has occasional weakness in both legs. No numbness or tingling.   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 She has far lateral disc on left at L3-L4 that affects left L3 nerve along with moderate central stenosis L3-L5, and right foraminal disc L5-S1 with moderate right foraminal stenosis.   Treatment options discussed with  patient and following plan made:   - She will finish out PT and continue with HEP.  - Discussed that she may see further improvement with injection from last week. She will follow up with Dr. Marcelino as scheduled.  - Weight loss discussed and encouraged prior to any consideration of surgery. She would like to avoid surgery if possible. Would need full length scoliosis xrays prior to this as well.  - Follow up with me in 3 months for recheck.   I spent a total of 25 minutes in face-to-face and non-face-to-face activities related to this patient's care today including review of outside records, review of imaging, review of symptoms, physical exam, discussion of differential diagnosis, discussion of treatment options, and documentation.   Glade Boys PA-C Dept. of Neurosurgery

## 2024-02-17 NOTE — Therapy (Signed)
 OUTPATIENT PHYSICAL THERAPY TREATMENT/RECERT   Patient Name: Stacey Nichols MRN: [id number] DOB:[birth year], 63 y.o., female Today's Date: 02/17/2024  END OF SESSION:  PT End of Session - 02/17/24 0822     Visit Number 12    Number of Visits 17    Date for PT Re-Evaluation 03/12/24    PT Start Time 0822    PT Stop Time 0905    PT Time Calculation (min) 43 min    Activity Tolerance Patient tolerated treatment well    Behavior During Therapy [facility] for tasks assessed/performed                   Past Medical History:  Diagnosis Date   AML (acute myeloblastic leukemia) (HCC) 2007   s/p chemo (Dr Alto)   Anemia    hx of   Anxiety    Arthritis    severe   Constipation    COVID-19 virus infection 07/04/2021   Depression    Diabetes mellitus without complication (HCC)    Fatty liver    GERD (gastroesophageal reflux disease)    H/O Bell's palsy    H/O hiatal hernia    Headache    occasional   High triglycerides    History of cancer chemotherapy    Leukemia (HCC)    Morbidly obese (HCC)    Osteopenia 06/08/2017   DEXA 2017 - T -1.8 spine   Pneumonia    hx of    Rheumatoid arthritis (HCC)    Sleep apnea    Stroke (HCC)    mini stroke-caused bells palsy for 1 month   Past Surgical History:  Procedure Laterality Date   ABDOMINAL HYSTERECTOMY  2001   heavy bleeding, fibroids, 1 ovary remains   BLEPHAROPLASTY Bilateral 06/2020   in [place]   COLONOSCOPY WITH PROPOFOL  N/A 09/19/2020   TA, int hem, rpt 7 yrs Renne, Darren, MD)   FACIAL COSMETIC SURGERY  06/2020   in [place]   LAPAROSCOPIC GASTRIC BAND REMOVAL WITH LAPAROSCOPIC GASTRIC SLEEVE RESECTION  04/26/2014   LAPAROSCOPIC GASTRIC BANDING  2000   performed in [place]   REPLACEMENT TOTAL KNEE Left 10/2017   Dr D[facility]   REPLACEMENT TOTAL KNEE Right 06/2018   Dr Darliss Persons [facility]   TUBAL LIGATION  1996   UPPER GI ENDOSCOPY N/A 04/26/2014   Procedure: UPPER GI ENDOSCOPY;  Surgeon: Donnice KATHEE Lunger, MD;  Location: [facility];  Service: General;  Laterality: N/A;   Patient Active Problem List   Diagnosis Date Noted   Chronic radicular lumbar pain 01/06/2024   Lumbar foraminal stenosis 01/06/2024   Abnormal x-ray of lumbar spine 10/25/2023   Abnormal urinalysis 10/25/2023   Generalized body aches 10/25/2023   Insulin  resistance syndrome 10/02/2023   Hyperinsulinemia 10/02/2023   Right wrist pain 10/02/2023   Gastroesophageal reflux disease without esophagitis 10/02/2023   Polyarthralgia 05/22/2023   Ex-smoker 03/12/2023   Osteoarthritis 12/10/2022   Insomnia 01/30/2022   Polyp of ascending colon    Recurrent respiratory infection 07/10/2020   Depression 07/10/2020   History of Bell's palsy 07/10/2020   History of colitis 07/10/2020   History of stroke 07/10/2020   Low serum vitamin B12 07/08/2020   Dysuria 06/28/2020   Dry mouth 06/28/2020   Verruca vulgaris 06/28/2020   Borderline hypothyroidism 04/08/2020   Encounter for general adult medical examination with abnormal findings 03/03/2018   NAFLD (nonalcoholic fatty liver disease) 91/79/7980   Osteopenia 06/08/2017   Trigeminal neuralgia 05/01/2017   Type  2 diabetes mellitus with other specified complication (HCC) 05/27/2016   Primary osteoarthritis of both knees 09/29/2015   Environmental and seasonal allergies 09/29/2015   Arthralgia of hands, bilateral 07/12/2015   Arthralgia of hip 07/12/2015   Degeneration of intervertebral disc of lumbar region with discogenic back pain and lower extremity pain 07/12/2015   Dyslipidemia 06/20/2015   Vitamin D  deficiency 06/20/2015   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   Obesity, morbid, BMI 40.0-49.9 (HCC) 01/10/2014   OSA (obstructive sleep apnea) 01/10/2014   Seasonal allergic rhinitis 09/30/2011   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   History of laparoscopic adjustable gastric banding 2000    PCP: Rilla Baller,  MD   REFERRING PROVIDER: Hilma Hastings, PA-C  REFERRING DIAG: [phone] (ICD-10-CM) - Lumbar spondylosis M43.16 (ICD-10-CM) - Spondylolisthesis of lumbar region M41.20 (ICD-10-CM) - Other idiopathic scoliosis, unspecified spinal region M54.16 (ICD-10-CM) - Lumbar radiculopathy M51.26 (ICD-10-CM) - HNP (herniated nucleus pulposus), lumbar  Rationale for Evaluation and Treatment: Rehabilitation  THERAPY DIAG:  Other low back pain  Radiculopathy, lumbar region  Sciatica, right side  Sciatica, left side  Difficulty in walking, not elsewhere classified  ONSET DATE: February 2025  SUBJECTIVE:                                                                                                                                                                                           SUBJECTIVE STATEMENT: Back is sometimes good, sometimes not good. The injections is not really helping anymore. 3-4/10 low back pain and no L LE pain currently. Had L LE pain yesterday. Feels like the last session helped.   Interpreter present      PERTINENT HISTORY:  Low back pain. Feels like she does not need an interpreter. Pain started February 2025, got tests, was told that it was due to back bones. Was given medicine which has not helped. Pain has worsened since onset. Pain comes and goes. L LE bothers her (along L L5 dermatome, not past the knee), pt also feels pain at L hip joint. The R side also hurts some times (R L5 dermatome), not past the knee with numbness and pain. Going to get injections next week to see if that helps.   Unknown mechanism of injury, sudden onset L lateral thigh with gradual worsening of pain  Had something similar 4 years ago but the pain went away. This time, the pain has not gone away.   PLOF: was able to ambulate about 1.5 hours and do anything she wanted to.   Interpreter present. [facility])  Interpreter was used during the majority of the  session.   (Having interpreter  during future sessions is recommended based on the eval)  No blood pressure problems per pt.   LATEX ALLERGIES   PAIN:  Are you having pain? Yes: NPRS scale: 0/10 (pt currently sitting) Pain location: low back, and L > R LE at L5 dermatome, not past the knees.  Pain description: sharp, pins and needles, numbness Aggravating factors: sitting down, standing up, laying down, staying still in one position Relieving factors: movement, change positions, walk a little bit.   PRECAUTIONS: Osteopenia  RED FLAGS: Bowel or bladder incontinence: No and Cauda equina syndrome: No   WEIGHT BEARING RESTRICTIONS: No  FALLS:  Has patient fallen in last 6 months? No  LIVING ENVIRONMENT: Lives with: lives alone Lives in: House/apartment Stairs: Yes: Internal: 15 steps; on left going up and External: 6 steps; none Has following equipment at home: Single point cane and walker without wheels  OCCUPATION: Not working.   PLOF: Independent  PATIENT GOALS: walk normally or the best that she could without pain.   NEXT MD VISIT: August 2025  OBJECTIVE:  Note: Objective measures were completed at Evaluation unless otherwise noted.  DIAGNOSTIC FINDINGS:  MR Lumbar Spine Wo Contrast  11/19/2023  Narrative & Impression  CLINICAL DATA:  Lumbar radiculopathy with symptoms persisting over 6 weeks of treatment   EXAM: MRI LUMBAR SPINE WITHOUT CONTRAST   TECHNIQUE: Multiplanar, multisequence MR imaging of the lumbar spine was performed. No intravenous contrast was administered.   COMPARISON:  None Available.   FINDINGS: Segmentation:  Standard.   Alignment:  Trace scoliosis and mild L4-5 anterolisthesis   Vertebrae: No fracture, evidence of discitis, or bone lesion. Mild degenerative superior endplate edema at L4   Conus medullaris and cauda equina: Conus extends to the L1-2 level. Conus and cauda equina appear normal.   Paraspinal and other soft tissues: No perispinal mass  or inflammation.   Disc levels:   T12- L1: Disc bulging and right facet spurring.   L1-L2: Circumferential disc bulging.  Negative facets   L2-L3: Disc bulging with narrowing and desiccation. Small central protrusion. Mild degenerative facet spurring.   L3-L4: Disc narrowing and bulging with moderate facet spurring. Degeneration and epidural fat expansion causes moderate thecal sac stenosis, 6 mm in the midline. Left foraminal to extraforaminal herniation contacting the left L3 nerve root.   L4-L5: Disc narrowing and bulging with tiny central protrusion. Degenerative facet spurring with ligamentum flavum thickening greater on the right. Epidural fat and degeneration causes moderate thecal sac stenosis. Patent foramina.   L5-S1:Disc desiccation and narrowing with small central protrusion. There is a right foraminal protrusion up lifting the L5 nerve root. Degenerative facet spurring greater on the right.   IMPRESSION: Generalized lumbar spine degeneration with scoliosis.   Focal foraminal impingement on the right at L5-S1 mainly due to disc protrusion.   On the symptomatic left side there is a L3-4 left far-lateral herniation which could affect the left L3 nerve root.   Degeneration and epidural fat causes moderate thecal sac stenosis at L3-4 and L4-5.     Electronically Signed   By: Dorn Roulette M.D.   On: 12/05/2023 08:11   DG Lumbar Spine Complete 10/24/2023  Narrative & Impression  CLINICAL DATA:  left low back pain with radiculopathy x 6 + wks   EXAM: LUMBAR SPINE - COMPLETE 4+ VIEW   COMPARISON:  X-ray lumbar spine 07/12/2015   FINDINGS: There is no evidence of lumbar spine fracture. Interval worsening of multilevel moderate  degenerative changes of the spine. Interval development of dextroscoliosis centered at the L3 level with associated 1.4 cm left to right translocation of the L4 vertebral body relation to the L5 level. Suggestion of likely osseous  neural foraminal stenosis. Multilevel moderate intervertebral disc space narrowing, most prominent at the L5-S1 level.   IMPRESSION: Interval development of dextroscoliosis centered at the L3 level with associated 1.4 cm left to right translocation of the L4 vertebral body relation to the L5 level. Consider cross-sectional imaging for further evaluation.     Electronically Signed   By: Morgane  Naveau M.D.   On: 10/25/2023 00:19    DG Si Joints 10/24/2023  Narrative & Impression  CLINICAL DATA:  Left SI JT pain.   EXAM: BILATERAL SACROILIAC JOINTS - 3 VIEW   COMPARISON:  None Available.   FINDINGS: There is bilateral hip degenerative change with joint space narrowing and small osteophytes. Pelvic ring is intact. The sacroiliac joint spaces are maintained and there is no evidence of arthropathy. No acute fracture, dislocation or subluxation. No osteolytic or osteoblastic lesions.   IMPRESSION: Bilateral hip degenerative changes. No sacroiliac joint arthropathy identified. No acute osseous abnormalities.     Electronically Signed   By: Fonda Field M.D.   On: 10/24/2023 21:53           PATIENT SURVEYS:  Modified Oswestry 17/50 (34%), (12/30/2023)   COGNITION: Overall cognitive status: Within functional limits for tasks assessed     SENSATION:   MUSCLE LENGTH:   POSTURE: R weight shift, forward neck, thoracic kyphosis, decreased L LE weight bearing, R lateral shift, increased R lateral shift around L2/3 area.   Increased L LE L5 dermatome pain with manual R lateral shift correction.    PALPATION:   LUMBAR ROM:   AROM eval  Flexion Full, no pain  Extension Limited with reproduction of back and L LE pain   Right lateral flexion full  Left lateral flexion Limited with L lateral thigh pain  Right rotation WFL with L lateral thigh pain  Left rotation WFL with low Back pain   (Blank rows = not tested)  LOWER EXTREMITY ROM:     Passive   Right eval Left eval  Hip flexion    Hip extension    Hip abduction    Hip adduction    Hip internal rotation 33 12 with L posterior and anterior hip joint pain  Hip external rotation 64 60  Knee flexion    Knee extension    Ankle dorsiflexion    Ankle plantarflexion    Ankle inversion    Ankle eversion     (Blank rows = not tested)  LOWER EXTREMITY MMT:    MMT Right eval Left eval  Hip flexion 4 4- with L lateral posterior thigh pain  Hip extension (seated manually resisted) 3 3  Hip abduction (seated manually resisted) 4 4  Hip adduction    Hip internal rotation    Hip external rotation    Knee flexion 5 4  Knee extension 5 4+  Ankle dorsiflexion 5 5  Ankle plantarflexion    Ankle inversion    Ankle eversion     (Blank rows = not tested)  LUMBAR SPECIAL TESTS:  Long sit test suggests anterior nutation of L innominate.   FUNCTIONAL TESTS:    GAIT: Distance walked: 10 ft Assistive device utilized: Single point cane Level of assistance: Modified independence Comments: decreased stance L LE, antalgic, SPC on R  TREATMENT DATE: 02/17/2024  Neuromuscular re education  Nu-Step seat 7, arms 7 at  L0 for 5 min UE and LE use for lumbar and hip AROM warm up   Seated exercise   Cervical retractions: 2x10, 2-3 sec holds    Scap retractions: 3x12, blue TB   B shoulder extension blue band 10x    L low back pain.   Sitting with upright posture               Gentle manually resisted L lateral shift isometrics in neutral to decrease L lumbar convexity                         10x5 seconds    Increased symptoms                 Transversus abdominis contraction with B shoulder extension isometrics 10x5 seconds.       Hook lying:    Posterior pelvic tilts: 2x10 reps with 5 second holds    Posterior tilt + bridge: 3x10, no pain. Feels her back  relax  SKTC: LLE: 3x30 sec    Improved exercise technique, movement at target joints, use of target muscles after mod verbal, visual, tactile cues.      PATIENT EDUCATION:  Education details: there-ex, HEP Person educated: Patient Education method: Explanation, Demonstration, Tactile cues, Verbal cues, Handouts, and interpreter Education comprehension: verbalized understanding and returned demonstration  HOME EXERCISE PROGRAM: Access Code: [email or web address] URL: [email or web address] Date: 01/01/2024 Prepared by: Emil Glassman  Exercises - Supine Transversus Abdominis Bracing - Hands on Stomach  - 3 x daily - 7 x weekly - 3 sets - 10 reps - 5 seconds hold - Supine Posterior Pelvic Tilt  - 1 x daily - 7 x weekly - 3 sets - 10 reps - 5 seconds hold  - Standing Hip Abduction with Counter Support  - 1 x daily - 7 x weekly - 3 sets - 10 reps - Standing Hip Extension with Counter Support  - 1 x daily - 7 x weekly - 3 sets - 10 reps - Seated Cervical Retraction  - 3 x daily - 7 x weekly - 3 sets - 5 reps - 5 seconds hold - Supine Bridge  - 1 x daily - 7 x weekly - 3 sets - 10 reps  Seated keigle  ASSESSMENT:  CLINICAL IMPRESSION:   Worked on thoracic extension and trunk strength to decrease stress to low back. Pt tolerated session well without aggravation of symptoms. Pt will benefit from continued skilled physical therapy services to decrease pain, improve strength and function.       OBJECTIVE IMPAIRMENTS: Abnormal gait, difficulty walking, decreased strength, improper body mechanics, postural dysfunction, and pain.   ACTIVITY LIMITATIONS: carrying, lifting, bending, sitting, standing, squatting, stairs, transfers, bed mobility, reach over head, and locomotion level  PARTICIPATION LIMITATIONS:   PERSONAL FACTORS: Fitness, Past/current experiences, Time since onset of injury/illness/exacerbation, and 3+ comorbidities: AML, anxiety, arthritis, DM, osteopenia, RA are also  affecting patient's functional outcome.   REHAB POTENTIAL: Fair    CLINICAL DECISION MAKING: Evolving/moderate complexity, pain is worsening since onset per pt  EVALUATION COMPLEXITY: Moderate   GOALS: Goals reviewed with patient? Yes  SHORT TERM GOALS: Target date: 01/09/2024  Pt will be independent with her initial HEP to decrease back pain, improve strength, and function. Baseline: Pt has not yet started her initial HEP (12/30/2023). No questions with her HEP (02/02/2024) Goal status: MET   LONG TERM GOALS: Target  date: 03/12/2024  PT will improve her Modified Oswestry score by at least 10% as a demonstration of improved function.  Baseline: Modified Oswestry 17/50 (34%), (12/30/2023); 02/10/24: 21/50 = 42% Goal status: PROGRESSING  2.  Pt will have a decrease in low back pain to 5/10 or less at worst to promote ability to ambulate as well as perform standing tasks more comfortably.  Baseline: Low back: 10+/10 at worst for the past 3 months. Pain sometimes hurt in the morning and cannot move (12/30/2023); 2-3/10 at most for the past 7 days (had injection recnetly) (02/02/2024); 02/10/24: 5-6/10 NPS with gait Goal status: PROGRESSING  3.  Pt will have a decrease in L LE and R LE pain to 5/10 or less at worst to promote ability to ambulate as well as perform standing tasks more comfortably.  Baseline: L LE: 10+/10 at worst for the past 3 months, R LE: 8/10 at worst for the past 3 months. (12/30/2023); L LE 2-3/10 and R LE 0/10 at worst for the past 7 days (pt had injection recently) (02/02/2024) Goal status: PROGRESSING  4.  Pt will improve B hip strength by at least 1/2 MMT grade to promote ability to perform standing tasks as well as ambulate more comfortably for her back and B LE. Baseline:  MMT Right eval Left eval  Hip flexion 4 4- with L lateral posterior thigh pain  Hip extension (seated manually resisted) 3 3  Hip abduction (seated manually resisted) 4 4   Goal status:  INITIAL   PLAN:  PT FREQUENCY: 1-2x/week  PT DURATION: 4 weeks  PLANNED INTERVENTIONS: 97110-Therapeutic exercises, 97530- Therapeutic activity, [id number]- Neuromuscular re-education, 97535- Self Care, [id number]- Manual therapy, [id number]- Gait training, [phone]- Aquatic Therapy, [phone]- Electrical stimulation (unattended), [id number]- Traction (mechanical), [id number]- Ionotophoresis 4mg /ml Dexamethasone , Patient/Family education, and Stair training.  PLAN FOR NEXT SESSION: improve posture, trunk and glute strength, manual techniques, modalities PRN  Providence Stivers, PT, DPT Physical Therapist- [facility]  [facility] 02/17/2024, 12:45 PM

## 2024-02-19 ENCOUNTER — Ambulatory Visit
Attending: Student in an Organized Health Care Education/Training Program | Admitting: Student in an Organized Health Care Education/Training Program

## 2024-02-19 ENCOUNTER — Encounter: Payer: Self-pay | Admitting: Student in an Organized Health Care Education/Training Program

## 2024-02-19 ENCOUNTER — Encounter: Payer: Self-pay | Admitting: Family Medicine

## 2024-02-19 VITALS — BP 121/86 | HR 82 | Temp 97.5°F | Resp 18 | Ht 62.0 in | Wt 240.0 lb

## 2024-02-19 DIAGNOSIS — G8929 Other chronic pain: Secondary | ICD-10-CM | POA: Diagnosis present

## 2024-02-19 DIAGNOSIS — Z6841 Body Mass Index (BMI) 40.0 and over, adult: Secondary | ICD-10-CM | POA: Diagnosis present

## 2024-02-19 DIAGNOSIS — M48061 Spinal stenosis, lumbar region without neurogenic claudication: Secondary | ICD-10-CM | POA: Insufficient documentation

## 2024-02-19 DIAGNOSIS — M5416 Radiculopathy, lumbar region: Secondary | ICD-10-CM | POA: Insufficient documentation

## 2024-02-19 DIAGNOSIS — E66813 Obesity, class 3: Secondary | ICD-10-CM | POA: Diagnosis not present

## 2024-02-19 NOTE — Patient Instructions (Signed)

## 2024-02-19 NOTE — Progress Notes (Signed)
 PROVIDER NOTE: Interpretation of information contained herein should be left to medically-trained personnel. Specific patient instructions are provided elsewhere under Patient Instructions section of medical record. This document was created in part using AI and STT-dictation technology, any transcriptional errors that may result from this process are unintentional.  Patient: Stacey Nichols  Service: E/M   PCP: Rilla Baller, MD  DOB: [birth year]  DOS: 02/19/2024  Provider: Wallie Sherry, MD  MRN: [id number]  Delivery: Face-to-face  Specialty: Interventional Pain Management  Type: Established Patient  Setting: Ambulatory outpatient facility  Specialty designation: 09  Referring Prov.: Rilla Baller, MD  Location: Outpatient office facility       History of present illness (HPI) [name], a 63 y.o. year old female, is here today because of her Lumbar radiculopathy [M54.16]. [name] primary complain today is Back Pain (lower)  Pertinent problems: [name] does not have any pertinent problems on file.  Pain Assessment: Severity of Chronic pain is reported as a 4 /10. Location: Back Lower/inner left upper leg. Onset: More than a month ago. Quality: Other (Comment) (electric shock). Timing: Intermittent. Modifying factor(s): rest, sitting. Vitals:  height is 5' 2 (1.575 m) and weight is 240 lb (108.9 kg). Her temporal temperature is 97.5 F (36.4 C) (abnormal). Her blood pressure is 121/86 and her pulse is 82. Her respiration is 18 and oxygen saturation is 95%.  BMI: Estimated body mass index is 43.9 kg/m as calculated from the following:   Height as of this encounter: 5' 2 (1.575 m).   Weight as of this encounter: 240 lb (108.9 kg).  Last encounter: 01/06/2024. Last procedure: 01/21/2024.  Reason for encounter:  Post-Procedure Evaluation   Procedure: Lumbar trans-foraminal epidural steroid injection (L-TFESI) #1  Laterality: Left (-LT)  Level: L3 nerve root(s) Imaging:  Fluoroscopy-guided         Anesthesia: Local anesthesia (1-2% Lidocaine ) Sedation: Minimal Sedation                       DOS: 01/21/2024  Performed by: Wallie Sherry, MD  Purpose: Diagnostic/Therapeutic Indications: Lumbar radicular pain severe enough to impact quality of life or function. 1. Lumbar radiculopathy (LEFT L3)   2. Chronic radicular lumbar pain   3. Lumbar foraminal stenosis    NAS-11 Pain score:   Pre-procedure: 7 /10   Post-procedure: 7 /10     Effectiveness:  Initial hour after procedure: 100% Subsequent 4-6 hours post-procedure: 100% Analgesia past initial 6 hours: 75 % (ongoing)  Ongoing improvement:  Analgesic:  75% Function: Somewhat improved ROM: Somewhat improved   History of Present Illness   Stacey Nichols is a 63 year old female who presents with recurrence of left leg pain after initial relief from an injection.  She experiences a recurrence of pain in her left leg after initially obtaining relief from a previous injection. The pain radiates down her left leg, stopping before the knee, and is still localized to the same area as before the injection.  She describes having days with 'zero out of ten pain' where she was completely pain-free, but notes that the effect of the injection has been waning over time.   She recalls receiving Valium  during her last injection procedure, which helped her remain comfortable during the process.  She is currently taking daily aspirin and metformin .         ROS  Constitutional: Denies any fever or chills Gastrointestinal: No reported hemesis, hematochezia, vomiting, or acute GI distress  Musculoskeletal: Left leg pain Neurological: No reported episodes of acute onset apraxia, aphasia, dysarthria, agnosia, amnesia, paralysis, loss of coordination, or loss of consciousness  Medication Review  Biotin , Cholecalciferol , acetaminophen , albuterol , aspirin, b complex vitamins, cycloSPORINE, cyclobenzaprine , metFORMIN ,  methylPREDNISolone , multivitamin, pantoprazole , rosuvastatin , tirzepatide , and traZODone   History Review  Allergy: [name] is allergic to latex, sulfa antibiotics, and tape. Drug: [name]  reports no history of drug use. Alcohol:  reports current alcohol use. Tobacco:  reports that she quit smoking about 5 years ago. Her smoking use included cigarettes. She started smoking about 35 years ago. She has never used smokeless tobacco. Social: [name]  reports that she quit smoking about 5 years ago. Her smoking use included cigarettes. She started smoking about 35 years ago. She has never used smokeless tobacco. She reports current alcohol use. She reports that she does not use drugs. Medical:  has a past medical history of AML (acute myeloblastic leukemia) (HCC) (2007), Anemia, Anxiety, Arthritis, Constipation, COVID-19 virus infection (07/04/2021), Depression, Diabetes mellitus without complication (HCC), Fatty liver, GERD (gastroesophageal reflux disease), H/O Bell's palsy, H/O hiatal hernia, Headache, High triglycerides, History of cancer chemotherapy, Leukemia (HCC), Morbidly obese (HCC), Osteopenia (06/08/2017), Pneumonia, Rheumatoid arthritis (HCC), Sleep apnea, and Stroke (HCC). Surgical: [name]  has a past surgical history that includes Laparoscopic gastric banding (2000); Tubal ligation (1996); Abdominal hysterectomy (2001); Upper gi endoscopy (N/A, 04/26/2014); Laparoscopic gastric band removal with laparoscopic gastric sleeve resection (04/26/2014); Blepharoplasty (Bilateral, 06/2020); Facial cosmetic surgery (06/2020); Colonoscopy with propofol  (N/A, 09/19/2020); Replacement total knee (Left, 10/2017); and Replacement total knee (Right, 06/2018). Family: family history includes CAD in her father; Cancer in her mother and sister; Diabetes in her maternal grandfather; Heart Problems in her father; Hyperlipidemia in her father; Obesity in her father.  Laboratory Chemistry Profile    Renal Lab Results  Component Value Date   BUN 23 09/24/2023   CREATININE 0.92 09/24/2023   BCR 27 (H) 09/04/2017   GFR 67.11 09/24/2023   GFRAA 115 09/04/2017   GFRNONAA >60 02/13/2022    Hepatic Lab Results  Component Value Date   AST 22 10/08/2023   ALT 28 10/08/2023   ALBUMIN 4.0 10/08/2023   ALKPHOS 75 10/08/2023   HCVAB NEGATIVE 12/25/2016    Electrolytes Lab Results  Component Value Date   NA 141 09/24/2023   K 4.0 09/24/2023   CL 104 09/24/2023   CALCIUM  9.5 09/24/2023   MG 1.8 03/12/2023    Bone Lab Results  Component Value Date   VD25OH 30.42 09/24/2023    Inflammation (CRP: Acute Phase) (ESR: Chronic Phase) Lab Results  Component Value Date   CRP <1.0 05/21/2023   ESRSEDRATE 14 05/21/2023   LATICACIDVEN 1.8 02/13/2022         Note: Above Lab results reviewed.  Recent Imaging Review  CLINICAL DATA:  Lumbar radiculopathy with symptoms persisting over 6 weeks of treatment   EXAM: MRI LUMBAR SPINE WITHOUT CONTRAST   TECHNIQUE: Multiplanar, multisequence MR imaging of the lumbar spine was performed. No intravenous contrast was administered.   COMPARISON:  None Available.   FINDINGS: Segmentation:  Standard.   Alignment:  Trace scoliosis and mild L4-5 anterolisthesis   Vertebrae: No fracture, evidence of discitis, or bone lesion. Mild degenerative superior endplate edema at L4   Conus medullaris and cauda equina: Conus extends to the L1-2 level. Conus and cauda equina appear normal.   Paraspinal and other soft tissues: No perispinal mass or inflammation.   Disc levels:  T12- L1: Disc bulging and right facet spurring.   L1-L2: Circumferential disc bulging.  Negative facets   L2-L3: Disc bulging with narrowing and desiccation. Small central protrusion. Mild degenerative facet spurring.   L3-L4: Disc narrowing and bulging with moderate facet spurring. Degeneration and epidural fat expansion causes moderate thecal sac stenosis, 6 mm  in the midline. Left foraminal to extraforaminal herniation contacting the left L3 nerve root.   L4-L5: Disc narrowing and bulging with tiny central protrusion. Degenerative facet spurring with ligamentum flavum thickening greater on the right. Epidural fat and degeneration causes moderate thecal sac stenosis. Patent foramina.   L5-S1:Disc desiccation and narrowing with small central protrusion. There is a right foraminal protrusion up lifting the L5 nerve root. Degenerative facet spurring greater on the right.   IMPRESSION: Generalized lumbar spine degeneration with scoliosis.   Focal foraminal impingement on the right at L5-S1 mainly due to disc protrusion.   On the symptomatic left side there is a L3-4 left far-lateral herniation which could affect the left L3 nerve root.   Degeneration and epidural fat causes moderate thecal sac stenosis at L3-4 and L4-5.     Electronically Signed By: Dorn Roulette M.D. On: 12/05/2023 08:11 Note: Reviewed        Physical Exam  Vitals: BP 121/86 (Cuff Size: Normal)   Pulse 82   Temp (!) 97.5 F (36.4 C) (Temporal)   Resp 18   Ht 5' 2 (1.575 m)   Wt 240 lb (108.9 kg)   SpO2 95%   BMI 43.90 kg/m  BMI: Estimated body mass index is 43.9 kg/m as calculated from the following:   Height as of this encounter: 5' 2 (1.575 m).   Weight as of this encounter: 240 lb (108.9 kg). Ideal: Ideal body weight: 50.1 kg (110 lb 7.2 oz) Adjusted ideal body weight: 73.6 kg (162 lb 4.3 oz) General appearance: Well nourished, well developed, and well hydrated. In no apparent acute distress Mental status: Alert, oriented x 3 (person, place, & time)       Respiratory: No evidence of acute respiratory distress Eyes: PERLA  Lumbar Spine Area Exam  Skin & Axial Inspection: No masses, redness, or swelling Alignment: Symmetrical Functional ROM: Pain restricted ROM affecting primarily the left Stability: No instability detected Muscle Tone/Strength:  Functionally intact. No obvious neuro-muscular anomalies detected. Sensory (Neurological): Dermatomal pain pattern Palpation: No palpable anomalies       Provocative Tests: Hyperextension/rotation test: deferred today       Lumbar quadrant test (Kemp's test): (+) on the left for foraminal stenosis Lateral bending test: (+) ipsilateral radicular pain, on the left. Positive for left-sided foraminal stenosis.   Gait & Posture Assessment  Ambulation: Patient ambulates using a cane Gait: Antalgic gait (limping) Posture: Difficulty standing up straight, due to pain  Lower Extremity Exam      Side: Right lower extremity   Side: Left lower extremity  Stability: No instability observed           Stability: No instability observed          Skin & Extremity Inspection: Skin color, temperature, and hair growth are WNL. No peripheral edema or cyanosis. No masses, redness, swelling, asymmetry, or associated skin lesions. No contractures.   Skin & Extremity Inspection: Skin color, temperature, and hair growth are WNL. No peripheral edema or cyanosis. No masses, redness, swelling, asymmetry, or associated skin lesions. No contractures.  Functional ROM: Unrestricted ROM  Functional ROM: Pain restricted ROM for all joints of the lower extremity          Muscle Tone/Strength: Functionally intact. No obvious neuro-muscular anomalies detected.   Muscle Tone/Strength: Functionally intact. No obvious neuro-muscular anomalies detected.  Sensory (Neurological): Unimpaired         Sensory (Neurological): Unimpaired        DTR: Patellar: deferred today Achilles: deferred today Plantar: deferred today   DTR: Patellar: deferred today Achilles: deferred today Plantar: deferred today  Palpation: No palpable anomalies   Palpation: No palpable anomalies     Assessment   Diagnosis  1. Lumbar radiculopathy (LEFT L3)   2. Chronic radicular lumbar pain   3. Lumbar foraminal stenosis   4. Class 3  severe obesity with serious comorbidity and body mass index (BMI) of 40.0 to 44.9 in adult, unspecified obesity type      Updated Problems: No problems updated.  Plan of Care  Problem-specific:  Assessment and Plan    Left lumbosacral radiculopathy with left lower extremity radicular pain   She initially experienced relief from a transforaminal steroid injection, with some days of zero pain, but now reports the effect is waning. Pain is localized to the left leg, stopping before the knee. A partial response to the initial injection suggests potential benefit from a repeat procedure. Schedule a repeat transforaminal steroid injection with increased volume for next Wednesday at 8:20 AM. Administer Valium  for sedation, contingent on her arranging a driver. If the repeat injection is ineffective, can consider intralaminar steroid injection.       Stacey Nichols has a current medication list which includes the following long-term medication(s): albuterol , metformin , pantoprazole , rosuvastatin , and trazodone .  Pharmacotherapy (Medications Ordered): No orders of the defined types were placed in this encounter.  Orders:  Orders Placed This Encounter  Procedures   Lumbar Transforaminal Epidural    Standing Status:   Future    Expected Date:   02/25/2024    Expiration Date:   02/18/2025    Scheduling Instructions:     Laterality: LEFT L3         Sedation: PO Valium      Timeframe: As soon as schedule allows.    Where will this procedure be performed?:   [facility]     Left L3 TF ESI 02/19/24-75% pain relief   Return in about 6 days (around 02/25/2024) for Left L3 TF ESI #2, in clinic (PO Valium  5mg ).    Recent Visits Date Type Provider Dept  01/21/24 Procedure visit Marcelino Nurse, MD [facility]  01/06/24 Office Visit Marcelino Nurse, MD [facility]  Showing recent visits within past 90 days and meeting all other requirements Today's Visits Date Type  Provider Dept  02/19/24 Office Visit Marcelino Nurse, MD [facility]  Showing today's visits and meeting all other requirements Future Appointments Date Type Provider Dept  02/25/24 Appointment Marcelino Nurse, MD [facility]  Showing future appointments within next 90 days and meeting all other requirements  I discussed the assessment and treatment plan with the patient. The patient was provided an opportunity to ask questions and all were answered. The patient agreed with the plan and demonstrated an understanding of the instructions.  Patient advised to call back or seek an in-person evaluation if the symptoms or condition worsens.  Duration of encounter: .  Total time on encounter, as per AMA guidelines included both the face-to-face and non-face-to-face time personally spent by the physician and/or  other qualified health care professional(s) on the day of the encounter (includes time in activities that require the physician or other qualified health care professional and does not include time in activities normally performed by clinical staff). Physician's time may include the following activities when performed: Preparing to see the patient (e.g., pre-charting review of records, searching for previously ordered imaging, lab work, and nerve conduction tests) Review of prior analgesic pharmacotherapies. Reviewing PMP Interpreting ordered tests (e.g., lab work, imaging, nerve conduction tests) Performing post-procedure evaluations, including interpretation of diagnostic procedures Obtaining and/or reviewing separately obtained history Performing a medically appropriate examination and/or evaluation Counseling and educating the patient/family/caregiver Ordering medications, tests, or procedures Referring and communicating with other health care professionals (when not separately reported) Documenting clinical information in the electronic or other health  record Independently interpreting results (not separately reported) and communicating results to the patient/ family/caregiver Care coordination (not separately reported)  Note by: Wallie Sherry, MD (TTS and AI technology used. I apologize for any typographical errors that were not detected and corrected.) Date: 02/19/2024; Time: 1:13 PM

## 2024-02-20 ENCOUNTER — Ambulatory Visit

## 2024-02-20 DIAGNOSIS — M5459 Other low back pain: Secondary | ICD-10-CM | POA: Diagnosis not present

## 2024-02-20 DIAGNOSIS — M5416 Radiculopathy, lumbar region: Secondary | ICD-10-CM | POA: Diagnosis not present

## 2024-02-20 DIAGNOSIS — M5432 Sciatica, left side: Secondary | ICD-10-CM

## 2024-02-20 DIAGNOSIS — M5431 Sciatica, right side: Secondary | ICD-10-CM

## 2024-02-20 NOTE — Therapy (Signed)
 OUTPATIENT PHYSICAL THERAPY TREATMEN  Patient Name: Stacey Nichols MRN: [id number] DOB:[birth year], 63 y.o., female Today's Date: 02/20/2024  END OF SESSION:  PT End of Session - 02/20/24 0817     Visit Number 13    Number of Visits 17    Date for PT Re-Evaluation 03/12/24    PT Start Time 0817    PT Stop Time 0901    PT Time Calculation (min) 44 min    Activity Tolerance Patient tolerated treatment well    Behavior During Therapy [facility] for tasks assessed/performed                    Past Medical History:  Diagnosis Date   AML (acute myeloblastic leukemia) (HCC) 2007   s/p chemo (Dr Alto)   Anemia    hx of   Anxiety    Arthritis    severe   Constipation    COVID-19 virus infection 07/04/2021   Depression    Diabetes mellitus without complication (HCC)    Fatty liver    GERD (gastroesophageal reflux disease)    H/O Bell's palsy    H/O hiatal hernia    Headache    occasional   High triglycerides    History of cancer chemotherapy    Leukemia (HCC)    Morbidly obese (HCC)    Osteopenia 06/08/2017   DEXA 2017 - T -1.8 spine   Pneumonia    hx of    Rheumatoid arthritis (HCC)    Sleep apnea    Stroke (HCC)    mini stroke-caused bells palsy for 1 month   Past Surgical History:  Procedure Laterality Date   ABDOMINAL HYSTERECTOMY  2001   heavy bleeding, fibroids, 1 ovary remains   BLEPHAROPLASTY Bilateral 06/2020   in [place]   COLONOSCOPY WITH PROPOFOL  N/A 09/19/2020   TA, int hem, rpt 7 yrs Renne, Darren, MD)   FACIAL COSMETIC SURGERY  06/2020   in [place]   LAPAROSCOPIC GASTRIC BAND REMOVAL WITH LAPAROSCOPIC GASTRIC SLEEVE RESECTION  04/26/2014   LAPAROSCOPIC GASTRIC BANDING  2000   performed in [place]   REPLACEMENT TOTAL KNEE Left 10/2017   Dr [facility]   REPLACEMENT TOTAL KNEE Right 06/2018   Dr Darliss Persons [facility]   TUBAL LIGATION  1996   UPPER GI ENDOSCOPY N/A 04/26/2014   Procedure: UPPER GI ENDOSCOPY;  Surgeon: Donnice KATHEE Lunger,  MD;  Location: [facility];  Service: General;  Laterality: N/A;   Patient Active Problem List   Diagnosis Date Noted   Chronic radicular lumbar pain 01/06/2024   Lumbar foraminal stenosis 01/06/2024   Abnormal x-ray of lumbar spine 10/25/2023   Abnormal urinalysis 10/25/2023   Generalized body aches 10/25/2023   Insulin  resistance syndrome 10/02/2023   Hyperinsulinemia 10/02/2023   Right wrist pain 10/02/2023   Gastroesophageal reflux disease without esophagitis 10/02/2023   Polyarthralgia 05/22/2023   Ex-smoker 03/12/2023   Osteoarthritis 12/10/2022   Insomnia 01/30/2022   Polyp of ascending colon    Recurrent respiratory infection 07/10/2020   Depression 07/10/2020   History of Bell's palsy 07/10/2020   History of colitis 07/10/2020   History of stroke 07/10/2020   Low serum vitamin B12 07/08/2020   Dysuria 06/28/2020   Dry mouth 06/28/2020   Verruca vulgaris 06/28/2020   Borderline hypothyroidism 04/08/2020   Encounter for general adult medical examination with abnormal findings 03/03/2018   NAFLD (nonalcoholic fatty liver disease) 91/79/7980   Osteopenia 06/08/2017   Trigeminal neuralgia 05/01/2017   Type  2 diabetes mellitus with other specified complication (HCC) 05/27/2016   Primary osteoarthritis of both knees 09/29/2015   Environmental and seasonal allergies 09/29/2015   Arthralgia of hands, bilateral 07/12/2015   Arthralgia of hip 07/12/2015   Degeneration of intervertebral disc of lumbar region with discogenic back pain and lower extremity pain 07/12/2015   Dyslipidemia 06/20/2015   Vitamin D  deficiency 06/20/2015   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   Obesity, morbid, BMI 40.0-49.9 (HCC) 01/10/2014   OSA (obstructive sleep apnea) 01/10/2014   Seasonal allergic rhinitis 09/30/2011   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   History of laparoscopic adjustable gastric banding 2000    PCP: Rilla Baller,  MD   REFERRING PROVIDER: Hilma Hastings, PA-C  REFERRING DIAG: [phone] (ICD-10-CM) - Lumbar spondylosis M43.16 (ICD-10-CM) - Spondylolisthesis of lumbar region M41.20 (ICD-10-CM) - Other idiopathic scoliosis, unspecified spinal region M54.16 (ICD-10-CM) - Lumbar radiculopathy M51.26 (ICD-10-CM) - HNP (herniated nucleus pulposus), lumbar  Rationale for Evaluation and Treatment: Rehabilitation  THERAPY DIAG:  Other low back pain  Radiculopathy, lumbar region  Sciatica, right side  Sciatica, left side  ONSET DATE: February 2025  SUBJECTIVE:                                                                                                                                                                                           SUBJECTIVE STATEMENT: Back is a 3/10 currently. The L LE 3/10 during gait. Going to get an injection for her back next Wednesday.     Interpreter present      PERTINENT HISTORY:  Low back pain. Feels like she does not need an interpreter. Pain started February 2025, got tests, was told that it was due to back bones. Was given medicine which has not helped. Pain has worsened since onset. Pain comes and goes. L LE bothers her (along L L5 dermatome, not past the knee), pt also feels pain at L hip joint. The R side also hurts some times (R L5 dermatome), not past the knee with numbness and pain. Going to get injections next week to see if that helps.   Unknown mechanism of injury, sudden onset L lateral thigh with gradual worsening of pain  Had something similar 4 years ago but the pain went away. This time, the pain has not gone away.   PLOF: was able to ambulate about 1.5 hours and do anything she wanted to.   Interpreter present. [facility])  Interpreter was used during the majority of the session.   (Having interpreter during future sessions is recommended based on the eval)  No blood pressure problems  per pt.   LATEX ALLERGIES   PAIN:  Are you having  pain? Yes: NPRS scale: 0/10 (pt currently sitting) Pain location: low back, and L > R LE at L5 dermatome, not past the knees.  Pain description: sharp, pins and needles, numbness Aggravating factors: sitting down, standing up, laying down, staying still in one position Relieving factors: movement, change positions, walk a little bit.   PRECAUTIONS: Osteopenia  RED FLAGS: Bowel or bladder incontinence: No and Cauda equina syndrome: No   WEIGHT BEARING RESTRICTIONS: No  FALLS:  Has patient fallen in last 6 months? No  LIVING ENVIRONMENT: Lives with: lives alone Lives in: House/apartment Stairs: Yes: Internal: 15 steps; on left going up and External: 6 steps; none Has following equipment at home: Single point cane and walker without wheels  OCCUPATION: Not working.   PLOF: Independent  PATIENT GOALS: walk normally or the best that she could without pain.   NEXT MD VISIT: August 2025  OBJECTIVE:  Note: Objective measures were completed at Evaluation unless otherwise noted.  DIAGNOSTIC FINDINGS:  MR Lumbar Spine Wo Contrast  11/19/2023  Narrative & Impression  CLINICAL DATA:  Lumbar radiculopathy with symptoms persisting over 6 weeks of treatment   EXAM: MRI LUMBAR SPINE WITHOUT CONTRAST   TECHNIQUE: Multiplanar, multisequence MR imaging of the lumbar spine was performed. No intravenous contrast was administered.   COMPARISON:  None Available.   FINDINGS: Segmentation:  Standard.   Alignment:  Trace scoliosis and mild L4-5 anterolisthesis   Vertebrae: No fracture, evidence of discitis, or bone lesion. Mild degenerative superior endplate edema at L4   Conus medullaris and cauda equina: Conus extends to the L1-2 level. Conus and cauda equina appear normal.   Paraspinal and other soft tissues: No perispinal mass or inflammation.   Disc levels:   T12- L1: Disc bulging and right facet spurring.   L1-L2: Circumferential disc bulging.  Negative facets   L2-L3:  Disc bulging with narrowing and desiccation. Small central protrusion. Mild degenerative facet spurring.   L3-L4: Disc narrowing and bulging with moderate facet spurring. Degeneration and epidural fat expansion causes moderate thecal sac stenosis, 6 mm in the midline. Left foraminal to extraforaminal herniation contacting the left L3 nerve root.   L4-L5: Disc narrowing and bulging with tiny central protrusion. Degenerative facet spurring with ligamentum flavum thickening greater on the right. Epidural fat and degeneration causes moderate thecal sac stenosis. Patent foramina.   L5-S1:Disc desiccation and narrowing with small central protrusion. There is a right foraminal protrusion up lifting the L5 nerve root. Degenerative facet spurring greater on the right.   IMPRESSION: Generalized lumbar spine degeneration with scoliosis.   Focal foraminal impingement on the right at L5-S1 mainly due to disc protrusion.   On the symptomatic left side there is a L3-4 left far-lateral herniation which could affect the left L3 nerve root.   Degeneration and epidural fat causes moderate thecal sac stenosis at L3-4 and L4-5.     Electronically Signed   By: Dorn Roulette M.D.   On: 12/05/2023 08:11   DG Lumbar Spine Complete 10/24/2023  Narrative & Impression  CLINICAL DATA:  left low back pain with radiculopathy x 6 + wks   EXAM: LUMBAR SPINE - COMPLETE 4+ VIEW   COMPARISON:  X-ray lumbar spine 07/12/2015   FINDINGS: There is no evidence of lumbar spine fracture. Interval worsening of multilevel moderate degenerative changes of the spine. Interval development of dextroscoliosis centered at the L3 level with associated 1.4 cm left  to right translocation of the L4 vertebral body relation to the L5 level. Suggestion of likely osseous neural foraminal stenosis. Multilevel moderate intervertebral disc space narrowing, most prominent at the L5-S1 level.   IMPRESSION: Interval  development of dextroscoliosis centered at the L3 level with associated 1.4 cm left to right translocation of the L4 vertebral body relation to the L5 level. Consider cross-sectional imaging for further evaluation.     Electronically Signed   By: Morgane  Naveau M.D.   On: 10/25/2023 00:19    DG Si Joints 10/24/2023  Narrative & Impression  CLINICAL DATA:  Left SI JT pain.   EXAM: BILATERAL SACROILIAC JOINTS - 3 VIEW   COMPARISON:  None Available.   FINDINGS: There is bilateral hip degenerative change with joint space narrowing and small osteophytes. Pelvic ring is intact. The sacroiliac joint spaces are maintained and there is no evidence of arthropathy. No acute fracture, dislocation or subluxation. No osteolytic or osteoblastic lesions.   IMPRESSION: Bilateral hip degenerative changes. No sacroiliac joint arthropathy identified. No acute osseous abnormalities.     Electronically Signed   By: Fonda Field M.D.   On: 10/24/2023 21:53           PATIENT SURVEYS:  Modified Oswestry 17/50 (34%), (12/30/2023)   COGNITION: Overall cognitive status: Within functional limits for tasks assessed     SENSATION:   MUSCLE LENGTH:   POSTURE: R weight shift, forward neck, thoracic kyphosis, decreased L LE weight bearing, R lateral shift, increased R lateral shift around L2/3 area.   Increased L LE L5 dermatome pain with manual R lateral shift correction.    PALPATION:   LUMBAR ROM:   AROM eval  Flexion Full, no pain  Extension Limited with reproduction of back and L LE pain   Right lateral flexion full  Left lateral flexion Limited with L lateral thigh pain  Right rotation WFL with L lateral thigh pain  Left rotation WFL with low Back pain   (Blank rows = not tested)  LOWER EXTREMITY ROM:     Passive  Right eval Left eval L (02/20/2024)  Hip flexion     Hip extension     Hip abduction     Hip adduction     Hip internal rotation 33 12 with L  posterior and anterior hip joint pain 32 No pain  Hip external rotation 64 60   Knee flexion     Knee extension     Ankle dorsiflexion     Ankle plantarflexion     Ankle inversion     Ankle eversion      (Blank rows = not tested)  LOWER EXTREMITY MMT:    MMT Right eval Left eval  Hip flexion 4 4- with L lateral posterior thigh pain  Hip extension (seated manually resisted) 3 3  Hip abduction (seated manually resisted) 4 4  Hip adduction    Hip internal rotation    Hip external rotation    Knee flexion 5 4  Knee extension 5 4+  Ankle dorsiflexion 5 5  Ankle plantarflexion    Ankle inversion    Ankle eversion     (Blank rows = not tested)  LUMBAR SPECIAL TESTS:  Long sit test suggests anterior nutation of L innominate.   FUNCTIONAL TESTS:    GAIT: Distance walked: 10 ft Assistive device utilized: Single point cane Level of assistance: Modified independence Comments: decreased stance L LE, antalgic, SPC on R  TREATMENT DATE: 02/20/2024  Neuromuscular re education  Performed with the intent of improving lumbopelvic and L hip mechanics and stability and decrease stress to low back and L hip during standing and gait.    Supine L SKTC with grey theraband at hip providing inferior lateral glide to help decrease stiffness  L 10x10 seconds   Supine L hip IR stretch with PT 1 minute x 2  Supine with L hip at 90/90  L hip IR with PT 10x2  Pt able to achieve 32 degrees L hip IR afterwards.   Decreased L LE pain to 2-3/10 during gait    Seated hip extension isometrics, thighs pressing on towel to decrease ground pressure to both knee joints  10x2 with 5 second holds alternating LE's  Seated hip adduction isometrics, using small blue ball   Pain free level of effort. 10x5 seconds   Seated hip abduction green band, hips less than 90 degrees  flexion 10x  Seated B shoulder extension isometrics, hands on thighs 10x10 seconds for 2 sets    Improved exercise technique, movement at target joints, use of target muscles after mod verbal, visual, tactile cues.      PATIENT EDUCATION:  Education details: there-ex, HEP Person educated: Patient Education method: Explanation, Demonstration, Tactile cues, Verbal cues, Handouts, and interpreter Education comprehension: verbalized understanding and returned demonstration  HOME EXERCISE PROGRAM: Access Code: [email or web address] URL: [email or web address] Date: 01/01/2024 Prepared by: Emil Glassman  Exercises - Supine Transversus Abdominis Bracing - Hands on Stomach  - 3 x daily - 7 x weekly - 3 sets - 10 reps - 5 seconds hold - Supine Posterior Pelvic Tilt  - 1 x daily - 7 x weekly - 3 sets - 10 reps - 5 seconds hold  - Standing Hip Abduction with Counter Support  - 1 x daily - 7 x weekly - 3 sets - 10 reps - Standing Hip Extension with Counter Support  - 1 x daily - 7 x weekly - 3 sets - 10 reps - Seated Cervical Retraction  - 3 x daily - 7 x weekly - 3 sets - 5 reps - 5 seconds hold - Supine Bridge  - 1 x daily - 7 x weekly - 3 sets - 10 reps  Seated keigle  ASSESSMENT:  CLINICAL IMPRESSION: Worked on improving L hip joint ROM and mobility secondary to hip IR ROM limitation with reproduction of symptoms during initial evaluation. Followed up with trunk and hip strengthening to promote stability after increasing ROM. No low back pain and decreased L LE pain reported after session during gait. Pt will benefit from continued skilled physical therapy services to decrease pain, improve strength and function.       OBJECTIVE IMPAIRMENTS: Abnormal gait, difficulty walking, decreased strength, improper body mechanics, postural dysfunction, and pain.   ACTIVITY LIMITATIONS: carrying, lifting, bending, sitting, standing, squatting, stairs, transfers, bed mobility, reach over head,  and locomotion level  PARTICIPATION LIMITATIONS:   PERSONAL FACTORS: Fitness, Past/current experiences, Time since onset of injury/illness/exacerbation, and 3+ comorbidities: AML, anxiety, arthritis, DM, osteopenia, RA are also affecting patient's functional outcome.   REHAB POTENTIAL: Fair    CLINICAL DECISION MAKING: Evolving/moderate complexity, pain is worsening since onset per pt  EVALUATION COMPLEXITY: Moderate   GOALS: Goals reviewed with patient? Yes  SHORT TERM GOALS: Target date: 01/09/2024  Pt will be independent with her initial HEP to decrease back pain, improve strength, and function. Baseline: Pt has not yet started her initial HEP (12/30/2023). No questions with her HEP (02/02/2024)  Goal status: MET   LONG TERM GOALS: Target date: 03/12/2024  PT will improve her Modified Oswestry score by at least 10% as a demonstration of improved function.  Baseline: Modified Oswestry 17/50 (34%), (12/30/2023); 02/10/24: 21/50 = 42% Goal status: PROGRESSING  2.  Pt will have a decrease in low back pain to 5/10 or less at worst to promote ability to ambulate as well as perform standing tasks more comfortably.  Baseline: Low back: 10+/10 at worst for the past 3 months. Pain sometimes hurt in the morning and cannot move (12/30/2023); 2-3/10 at most for the past 7 days (had injection recnetly) (02/02/2024); 02/10/24: 5-6/10 NPS with gait Goal status: PROGRESSING  3.  Pt will have a decrease in L LE and R LE pain to 5/10 or less at worst to promote ability to ambulate as well as perform standing tasks more comfortably.  Baseline: L LE: 10+/10 at worst for the past 3 months, R LE: 8/10 at worst for the past 3 months. (12/30/2023); L LE 2-3/10 and R LE 0/10 at worst for the past 7 days (pt had injection recently) (02/02/2024) Goal status: PROGRESSING  4.  Pt will improve B hip strength by at least 1/2 MMT grade to promote ability to perform standing tasks as well as ambulate more comfortably for  her back and B LE. Baseline:  MMT Right eval Left eval  Hip flexion 4 4- with L lateral posterior thigh pain  Hip extension (seated manually resisted) 3 3  Hip abduction (seated manually resisted) 4 4   Goal status: INITIAL   PLAN:  PT FREQUENCY: 1-2x/week  PT DURATION: 4 weeks  PLANNED INTERVENTIONS: 97110-Therapeutic exercises, 97530- Therapeutic activity, [id number]- Neuromuscular re-education, 97535- Self Care, [id number]- Manual therapy, [id number]- Gait training, [phone]- Aquatic Therapy, [phone]- Electrical stimulation (unattended), [id number]- Traction (mechanical), [id number]- Ionotophoresis 4mg /ml Dexamethasone , Patient/Family education, and Stair training.  PLAN FOR NEXT SESSION: improve posture, trunk and glute strength, manual techniques, modalities PRN  Laneta Guerin, PT, DPT Physical Therapist- [place]  [facility] 02/20/2024, 9:15 AM

## 2024-02-24 ENCOUNTER — Ambulatory Visit

## 2024-02-24 DIAGNOSIS — M5431 Sciatica, right side: Secondary | ICD-10-CM

## 2024-02-24 DIAGNOSIS — M5416 Radiculopathy, lumbar region: Secondary | ICD-10-CM | POA: Diagnosis not present

## 2024-02-24 DIAGNOSIS — M5432 Sciatica, left side: Secondary | ICD-10-CM

## 2024-02-24 DIAGNOSIS — R262 Difficulty in walking, not elsewhere classified: Secondary | ICD-10-CM

## 2024-02-24 DIAGNOSIS — M5459 Other low back pain: Secondary | ICD-10-CM

## 2024-02-24 NOTE — Therapy (Signed)
 OUTPATIENT PHYSICAL THERAPY TREATMEN  Patient Name: Stacey Nichols MRN: [id number] DOB:09/23/1960, 63 y.o., female Today's Date: 02/24/2024  END OF SESSION:  PT End of Session - 02/24/24 0821     Visit Number 14    Number of Visits 17    Date for PT Re-Evaluation 03/12/24    PT Start Time 0821    PT Stop Time 0902    PT Time Calculation (min) 41 min    Activity Tolerance Patient tolerated treatment well    Behavior During Therapy [facility] for tasks assessed/performed                    Past Medical History:  Diagnosis Date   AML (acute myeloblastic leukemia) (HCC) 2007   s/p chemo (Dr Alto)   Anemia    hx of   Anxiety    Arthritis    severe   Constipation    COVID-19 virus infection 07/04/2021   Depression    Diabetes mellitus without complication (HCC)    Fatty liver    GERD (gastroesophageal reflux disease)    H/O Bell's palsy    H/O hiatal hernia    Headache    occasional   High triglycerides    History of cancer chemotherapy    Leukemia (HCC)    Morbidly obese (HCC)    Osteopenia 06/08/2017   DEXA 2017 - T -1.8 spine   Pneumonia    hx of    Rheumatoid arthritis (HCC)    Sleep apnea    Stroke (HCC)    mini stroke-caused bells palsy for 1 month   Past Surgical History:  Procedure Laterality Date   ABDOMINAL HYSTERECTOMY  2001   heavy bleeding, fibroids, 1 ovary remains   BLEPHAROPLASTY Bilateral 06/2020   in [place]   COLONOSCOPY WITH PROPOFOL  N/A 09/19/2020   TA, int hem, rpt 7 yrs Renne, Darren, MD)   FACIAL COSMETIC SURGERY  06/2020   in [place]   LAPAROSCOPIC GASTRIC BAND REMOVAL WITH LAPAROSCOPIC GASTRIC SLEEVE RESECTION  04/26/2014   LAPAROSCOPIC GASTRIC BANDING  2000   performed in [place]   REPLACEMENT TOTAL KNEE Left 10/2017   Dr Darliss [facility]ons [facility]   REPLACEMENT TOTAL KNEE Right 06/2018   Dr Darliss Persons [facility]   TUBAL LIGATION  1996   UPPER GI ENDOSCOPY N/A 04/26/2014   Procedure: UPPER GI ENDOSCOPY;  Surgeon: Donnice KATHEE Lunger, MD;  Location: [facility];  Service: General;  Laterality: N/A;   Patient Active Problem List   Diagnosis Date Noted   Chronic radicular lumbar pain 01/06/2024   Lumbar foraminal stenosis 01/06/2024   Abnormal x-ray of lumbar spine 10/25/2023   Abnormal urinalysis 10/25/2023   Generalized body aches 10/25/2023   Insulin  resistance syndrome 10/02/2023   Hyperinsulinemia 10/02/2023   Right wrist pain 10/02/2023   Gastroesophageal reflux disease without esophagitis 10/02/2023   Polyarthralgia 05/22/2023   Ex-smoker 03/12/2023   Osteoarthritis 12/10/2022   Insomnia 01/30/2022   Polyp of ascending colon    Recurrent respiratory infection 07/10/2020   Depression 07/10/2020   History of Bell's palsy 07/10/2020   History of colitis 07/10/2020   History of stroke 07/10/2020   Low serum vitamin B12 07/08/2020   Dysuria 06/28/2020   Dry mouth 06/28/2020   Verruca vulgaris 06/28/2020   Borderline hypothyroidism 04/08/2020   Encounter for general adult medical examination with abnormal findings 03/03/2018   NAFLD (nonalcoholic fatty liver disease) 91/79/7980   Osteopenia 06/08/2017   Trigeminal neuralgia 05/01/2017   Type  2 diabetes mellitus with other specified complication (HCC) 05/27/2016   Primary osteoarthritis of both knees 09/29/2015   Environmental and seasonal allergies 09/29/2015   Arthralgia of hands, bilateral 07/12/2015   Arthralgia of hip 07/12/2015   Degeneration of intervertebral disc of lumbar region with discogenic back pain and lower extremity pain 07/12/2015   Dyslipidemia 06/20/2015   Vitamin D  deficiency 06/20/2015   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   Obesity, morbid, BMI 40.0-49.9 (HCC) 01/10/2014   OSA (obstructive sleep apnea) 01/10/2014   Seasonal allergic rhinitis 09/30/2011   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   History of laparoscopic adjustable gastric banding 2000    PCP: Rilla Baller,  MD   REFERRING PROVIDER: Hilma Hastings, PA-C  REFERRING DIAG: [phone] (ICD-10-CM) - Lumbar spondylosis M43.16 (ICD-10-CM) - Spondylolisthesis of lumbar region M41.20 (ICD-10-CM) - Other idiopathic scoliosis, unspecified spinal region M54.16 (ICD-10-CM) - Lumbar radiculopathy M51.26 (ICD-10-CM) - HNP (herniated nucleus pulposus), lumbar  Rationale for Evaluation and Treatment: Rehabilitation  THERAPY DIAG:  Other low back pain  Radiculopathy, lumbar region  Sciatica, right side  Sciatica, left side  Difficulty in walking, not elsewhere classified  ONSET DATE: February 2025  SUBJECTIVE:                                                                                                                                                                                           SUBJECTIVE STATEMENT: The back is a 3/10 currently the L LE is a 4/10 currently. Could not walk during the weekend because of B knee joint pain. Did not have pain in her back and had 1-2/10 L LE pain during the weekend.   Interpreter present      PERTINENT HISTORY:  Low back pain. Feels like she does not need an interpreter. Pain started February 2025, got tests, was told that it was due to back bones. Was given medicine which has not helped. Pain has worsened since onset. Pain comes and goes. L LE bothers her (along L L5 dermatome, not past the knee), pt also feels pain at L hip joint. The R side also hurts some times (R L5 dermatome), not past the knee with numbness and pain. Going to get injections next week to see if that helps.   Unknown mechanism of injury, sudden onset L lateral thigh with gradual worsening of pain  Had something similar 4 years ago but the pain went away. This time, the pain has not gone away.   PLOF: was able to ambulate about 1.5 hours and do anything she wanted to.   Interpreter present. [facility])  Interpreter was  used during the majority of the session.   (Having interpreter  during future sessions is recommended based on the eval)  No blood pressure problems per pt.   LATEX ALLERGIES   PAIN:  Are you having pain? Yes: NPRS scale: 0/10 (pt currently sitting) Pain location: low back, and L > R LE at L5 dermatome, not past the knees.  Pain description: sharp, pins and needles, numbness Aggravating factors: sitting down, standing up, laying down, staying still in one position Relieving factors: movement, change positions, walk a little bit.   PRECAUTIONS: Osteopenia  RED FLAGS: Bowel or bladder incontinence: No and Cauda equina syndrome: No   WEIGHT BEARING RESTRICTIONS: No  FALLS:  Has patient fallen in last 6 months? No  LIVING ENVIRONMENT: Lives with: lives alone Lives in: House/apartment Stairs: Yes: Internal: 15 steps; on left going up and External: 6 steps; none Has following equipment at home: Single point cane and walker without wheels  OCCUPATION: Not working.   PLOF: Independent  PATIENT GOALS: walk normally or the best that she could without pain.   NEXT MD VISIT: August 2025  OBJECTIVE:  Note: Objective measures were completed at Evaluation unless otherwise noted.  DIAGNOSTIC FINDINGS:  MR Lumbar Spine Wo Contrast  11/19/2023  Narrative & Impression  CLINICAL DATA:  Lumbar radiculopathy with symptoms persisting over 6 weeks of treatment   EXAM: MRI LUMBAR SPINE WITHOUT CONTRAST   TECHNIQUE: Multiplanar, multisequence MR imaging of the lumbar spine was performed. No intravenous contrast was administered.   COMPARISON:  None Available.   FINDINGS: Segmentation:  Standard.   Alignment:  Trace scoliosis and mild L4-5 anterolisthesis   Vertebrae: No fracture, evidence of discitis, or bone lesion. Mild degenerative superior endplate edema at L4   Conus medullaris and cauda equina: Conus extends to the L1-2 level. Conus and cauda equina appear normal.   Paraspinal and other soft tissues: No perispinal mass  or inflammation.   Disc levels:   T12- L1: Disc bulging and right facet spurring.   L1-L2: Circumferential disc bulging.  Negative facets   L2-L3: Disc bulging with narrowing and desiccation. Small central protrusion. Mild degenerative facet spurring.   L3-L4: Disc narrowing and bulging with moderate facet spurring. Degeneration and epidural fat expansion causes moderate thecal sac stenosis, 6 mm in the midline. Left foraminal to extraforaminal herniation contacting the left L3 nerve root.   L4-L5: Disc narrowing and bulging with tiny central protrusion. Degenerative facet spurring with ligamentum flavum thickening greater on the right. Epidural fat and degeneration causes moderate thecal sac stenosis. Patent foramina.   L5-S1:Disc desiccation and narrowing with small central protrusion. There is a right foraminal protrusion up lifting the L5 nerve root. Degenerative facet spurring greater on the right.   IMPRESSION: Generalized lumbar spine degeneration with scoliosis.   Focal foraminal impingement on the right at L5-S1 mainly due to disc protrusion.   On the symptomatic left side there is a L3-4 left far-lateral herniation which could affect the left L3 nerve root.   Degeneration and epidural fat causes moderate thecal sac stenosis at L3-4 and L4-5.     Electronically Signed   By: Dorn Roulette M.D.   On: 12/05/2023 08:11   DG Lumbar Spine Complete 10/24/2023  Narrative & Impression  CLINICAL DATA:  left low back pain with radiculopathy x 6 + wks   EXAM: LUMBAR SPINE - COMPLETE 4+ VIEW   COMPARISON:  X-ray lumbar spine 07/12/2015   FINDINGS: There is no evidence of lumbar spine  fracture. Interval worsening of multilevel moderate degenerative changes of the spine. Interval development of dextroscoliosis centered at the L3 level with associated 1.4 cm left to right translocation of the L4 vertebral body relation to the L5 level. Suggestion of likely osseous  neural foraminal stenosis. Multilevel moderate intervertebral disc space narrowing, most prominent at the L5-S1 level.   IMPRESSION: Interval development of dextroscoliosis centered at the L3 level with associated 1.4 cm left to right translocation of the L4 vertebral body relation to the L5 level. Consider cross-sectional imaging for further evaluation.     Electronically Signed   By: Morgane  Naveau M.D.   On: 10/25/2023 00:19    DG Si Joints 10/24/2023  Narrative & Impression  CLINICAL DATA:  Left SI JT pain.   EXAM: BILATERAL SACROILIAC JOINTS - 3 VIEW   COMPARISON:  None Available.   FINDINGS: There is bilateral hip degenerative change with joint space narrowing and small osteophytes. Pelvic ring is intact. The sacroiliac joint spaces are maintained and there is no evidence of arthropathy. No acute fracture, dislocation or subluxation. No osteolytic or osteoblastic lesions.   IMPRESSION: Bilateral hip degenerative changes. No sacroiliac joint arthropathy identified. No acute osseous abnormalities.     Electronically Signed   By: Fonda Field M.D.   On: 10/24/2023 21:53           PATIENT SURVEYS:  Modified Oswestry 17/50 (34%), (12/30/2023)   COGNITION: Overall cognitive status: Within functional limits for tasks assessed     SENSATION:   MUSCLE LENGTH:   POSTURE: R weight shift, forward neck, thoracic kyphosis, decreased L LE weight bearing, R lateral shift, increased R lateral shift around L2/3 area.   Increased L LE L5 dermatome pain with manual R lateral shift correction.    PALPATION:   LUMBAR ROM:   AROM eval  Flexion Full, no pain  Extension Limited with reproduction of back and L LE pain   Right lateral flexion full  Left lateral flexion Limited with L lateral thigh pain  Right rotation WFL with L lateral thigh pain  Left rotation WFL with low Back pain   (Blank rows = not tested)  LOWER EXTREMITY ROM:     Passive   Right eval Left eval L (02/20/2024)  Hip flexion     Hip extension     Hip abduction     Hip adduction     Hip internal rotation 33 12 with L posterior and anterior hip joint pain 32 No pain  Hip external rotation 64 60   Knee flexion     Knee extension     Ankle dorsiflexion     Ankle plantarflexion     Ankle inversion     Ankle eversion      (Blank rows = not tested)  LOWER EXTREMITY MMT:    MMT Right eval Left eval  Hip flexion 4 4- with L lateral posterior thigh pain  Hip extension (seated manually resisted) 3 3  Hip abduction (seated manually resisted) 4 4  Hip adduction    Hip internal rotation    Hip external rotation    Knee flexion 5 4  Knee extension 5 4+  Ankle dorsiflexion 5 5  Ankle plantarflexion    Ankle inversion    Ankle eversion     (Blank rows = not tested)  LUMBAR SPECIAL TESTS:  Long sit test suggests anterior nutation of L innominate.   FUNCTIONAL TESTS:    GAIT: Distance walked: 10 ft Assistive device utilized:  Single point cane Level of assistance: Modified independence Comments: decreased stance L LE, antalgic, SPC on R  TREATMENT DATE: 02/24/2024                                                                                                                                Neuromuscular re education  Performed with the intent of improving lumbopelvic and L hip mechanics and stability and decrease stress to low back and L hip during standing and gait.   Supine L hip IR at 90/90 1x  WFL   Supine with B LE propped on blue wedge bolster to decrease pressure to knees  Posterior pelvic tilt with glute max contraction 10x 5 seconds   Low back discomfort  Seated hip extension isometrics, thighs pressing on towel to decrease ground pressure to both knee joints  10x3 with 5 second holds alternating LE's  Seated B shoulder extension red band 10x5 seconds for 3 sets  Seated L hip ER 10x5 seconds for 3 sets  Seated L hip IR 2x5  seconds  L lateral hip discomfort, slowly eases with rest.   Standing B shoulder extension red band to promote trunk strength  10x3 second holds  Standing posterior pelvic tilt 5x10 seconds for 2 sets  Decreased pain     Improved exercise technique, movement at target joints, use of target muscles after mod verbal, visual, tactile cues.      PATIENT EDUCATION:  Education details: there-ex, HEP Person educated: Patient Education method: Explanation, Demonstration, Tactile cues, Verbal cues, Handouts, and interpreter Education comprehension: verbalized understanding and returned demonstration  HOME EXERCISE PROGRAM: Access Code: [email or web address] URL: [email or web address] Date: 01/01/2024 Prepared by: Emil Glassman  Exercises - Supine Transversus Abdominis Bracing - Hands on Stomach  - 3 x daily - 7 x weekly - 3 sets - 10 reps - 5 seconds hold - Supine Posterior Pelvic Tilt  - 1 x daily - 7 x weekly - 3 sets - 10 reps - 5 seconds hold  - Standing Hip Abduction with Counter Support  - 1 x daily - 7 x weekly - 3 sets - 10 reps - Standing Hip Extension with Counter Support  - 1 x daily - 7 x weekly - 3 sets - 10 reps - Seated Cervical Retraction  - 3 x daily - 7 x weekly - 3 sets - 5 reps - 5 seconds hold - Supine Bridge  - 1 x daily - 7 x weekly - 3 sets - 10 reps  Seated keigle  - Standing Posterior Pelvic Tilt  - 5 x daily - 7 x weekly - 3 sets - 10 reps - 5 seconds hold  ASSESSMENT:  CLINICAL IMPRESSION: Good response for low back and L LE pain after last session based on subjective reports. Main challenge in between sessions were B knee joint pain from the exercises. Continued working on trunk and hip strength to help decrease stress to low back.  Decreased low back and L LE pain to 1/10 during gait after session especially after standing posterior pelvic tilt exrecise to decrease lumbar extension stress to L LE nerves. Pt will benefit from continued skilled physical  therapy services to decrease pain, improve strength and function.       OBJECTIVE IMPAIRMENTS: Abnormal gait, difficulty walking, decreased strength, improper body mechanics, postural dysfunction, and pain.   ACTIVITY LIMITATIONS: carrying, lifting, bending, sitting, standing, squatting, stairs, transfers, bed mobility, reach over head, and locomotion level  PARTICIPATION LIMITATIONS:   PERSONAL FACTORS: Fitness, Past/current experiences, Time since onset of injury/illness/exacerbation, and 3+ comorbidities: AML, anxiety, arthritis, DM, osteopenia, RA are also affecting patient's functional outcome.   REHAB POTENTIAL: Fair    CLINICAL DECISION MAKING: Evolving/moderate complexity, pain is worsening since onset per pt  EVALUATION COMPLEXITY: Moderate   GOALS: Goals reviewed with patient? Yes  SHORT TERM GOALS: Target date: 01/09/2024  Pt will be independent with her initial HEP to decrease back pain, improve strength, and function. Baseline: Pt has not yet started her initial HEP (12/30/2023). No questions with her HEP (02/02/2024) Goal status: MET   LONG TERM GOALS: Target date: 03/12/2024  PT will improve her Modified Oswestry score by at least 10% as a demonstration of improved function.  Baseline: Modified Oswestry 17/50 (34%), (12/30/2023); 02/10/24: 21/50 = 42% Goal status: PROGRESSING  2.  Pt will have a decrease in low back pain to 5/10 or less at worst to promote ability to ambulate as well as perform standing tasks more comfortably.  Baseline: Low back: 10+/10 at worst for the past 3 months. Pain sometimes hurt in the morning and cannot move (12/30/2023); 2-3/10 at most for the past 7 days (had injection recnetly) (02/02/2024); 02/10/24: 5-6/10 NPS with gait Goal status: PROGRESSING  3.  Pt will have a decrease in L LE and R LE pain to 5/10 or less at worst to promote ability to ambulate as well as perform standing tasks more comfortably.  Baseline: L LE: 10+/10 at worst for  the past 3 months, R LE: 8/10 at worst for the past 3 months. (12/30/2023); L LE 2-3/10 and R LE 0/10 at worst for the past 7 days (pt had injection recently) (02/02/2024) Goal status: PROGRESSING  4.  Pt will improve B hip strength by at least 1/2 MMT grade to promote ability to perform standing tasks as well as ambulate more comfortably for her back and B LE. Baseline:  MMT Right eval Left eval  Hip flexion 4 4- with L lateral posterior thigh pain  Hip extension (seated manually resisted) 3 3  Hip abduction (seated manually resisted) 4 4   Goal status: INITIAL   PLAN:  PT FREQUENCY: 1-2x/week  PT DURATION: 4 weeks  PLANNED INTERVENTIONS: 97110-Therapeutic exercises, 97530- Therapeutic activity, [id number]- Neuromuscular re-education, 97535- Self Care, [id number]- Manual therapy, [id number]- Gait training, [phone]- Aquatic Therapy, [phone]- Electrical stimulation (unattended), [id number]- Traction (mechanical), [id number]- Ionotophoresis 4mg /ml Dexamethasone , Patient/Family education, and Stair training.  PLAN FOR NEXT SESSION: improve posture, trunk and glute strength, manual techniques, modalities PRN  Kimble Hitchens, PT, DPT Physical Therapist- [facility]  [facility] 02/24/2024, 4:39 PM

## 2024-02-25 ENCOUNTER — Ambulatory Visit
Admission: RE | Admit: 2024-02-25 | Discharge: 2024-02-25 | Disposition: A | Discharge status: Home / Self Care | Source: Ambulatory Visit | Attending: Student in an Organized Health Care Education/Training Program | Admitting: Student in an Organized Health Care Education/Training Program

## 2024-02-25 ENCOUNTER — Ambulatory Visit (HOSPITAL_BASED_OUTPATIENT_CLINIC_OR_DEPARTMENT_OTHER): Admitting: Student in an Organized Health Care Education/Training Program

## 2024-02-25 ENCOUNTER — Encounter: Payer: Self-pay | Admitting: Student in an Organized Health Care Education/Training Program

## 2024-02-25 DIAGNOSIS — M48061 Spinal stenosis, lumbar region without neurogenic claudication: Secondary | ICD-10-CM

## 2024-02-25 DIAGNOSIS — M5416 Radiculopathy, lumbar region: Secondary | ICD-10-CM | POA: Insufficient documentation

## 2024-02-25 DIAGNOSIS — G8929 Other chronic pain: Secondary | ICD-10-CM

## 2024-02-25 MED ORDER — SODIUM CHLORIDE 0.9% FLUSH
1.0000 mL | Freq: Once | INTRAVENOUS | Status: AC
  Administered 2024-02-25 (×2): 1 mL

## 2024-02-25 MED ORDER — DEXAMETHASONE SODIUM PHOSPHATE 10 MG/ML IJ SOLN
10.0000 mg | Freq: Once | INTRAMUSCULAR | Status: AC
  Administered 2024-02-25 (×2): 10 mg
  Filled 2024-02-25: qty 1

## 2024-02-25 MED ORDER — IOHEXOL 180 MG/ML  SOLN
10.0000 mL | Freq: Once | INTRAMUSCULAR | Status: AC
  Administered 2024-02-25 (×2): 10 mL via EPIDURAL
  Filled 2024-02-25: qty 20

## 2024-02-25 MED ORDER — DIAZEPAM 5 MG PO TABS
5.0000 mg | ORAL_TABLET | ORAL | Status: AC
  Administered 2024-02-25 (×2): 5 mg via ORAL

## 2024-02-25 MED ORDER — ROPIVACAINE HCL 2 MG/ML IJ SOLN
1.0000 mL | Freq: Once | INTRAMUSCULAR | Status: AC
  Administered 2024-02-25 (×2): 1 mL via EPIDURAL
  Filled 2024-02-25: qty 20

## 2024-02-25 MED ORDER — LIDOCAINE HCL 2 % IJ SOLN
20.0000 mL | Freq: Once | INTRAMUSCULAR | Status: AC
  Administered 2024-02-25 (×2): 100 mg
  Filled 2024-02-25: qty 40

## 2024-02-25 MED ORDER — DIAZEPAM 5 MG PO TABS
ORAL_TABLET | ORAL | Status: AC
  Filled 2024-02-25: qty 1

## 2024-02-25 NOTE — Progress Notes (Signed)
 Safety precautions to be maintained throughout the outpatient stay will include: orient to surroundings, keep bed in low position, maintain call bell within reach at all times, provide assistance with transfer out of bed and ambulation.

## 2024-02-25 NOTE — Progress Notes (Signed)
 PROVIDER NOTE: Interpretation of information contained herein should be left to medically-trained personnel. Specific patient instructions are provided elsewhere under Patient Instructions section of medical record. This document was created in part using STT-dictation technology, any transcriptional errors that may result from this process are unintentional.  Patient: Stacey Nichols Type: Established DOB: April 22, 1961 MRN: [id number] PCP: Rilla Baller, MD  Service: Procedure DOS: 02/25/2024 Setting: Ambulatory Location: Ambulatory outpatient facility Delivery: Face-to-face Provider: Wallie Sherry, MD Specialty: Interventional Pain Management Specialty designation: 09 Location: Outpatient facility Ref. Prov.: Sherry Wallie, MD       Interventional Therapy   Procedure: Lumbar trans-foraminal epidural steroid injection (L-TFESI) #2  Laterality: Left (-LT)  Level: L3 nerve root(s) Imaging: Fluoroscopy-guided         Anesthesia: Local anesthesia (1-2% Lidocaine ) Sedation: Minimal Sedation                       DOS: 02/25/2024  Performed by: Wallie Sherry, MD  Purpose: Diagnostic/Therapeutic Indications: Lumbar radicular pain severe enough to impact quality of life or function. 1. Lumbar radiculopathy (LEFT L3)   2. Chronic radicular lumbar pain   3. Lumbar foraminal stenosis    NAS-11 Pain score:   Pre-procedure: 3 /10   Post-procedure: 3 /10     Position / Prep / Materials:  Position: Prone  Prep solution: ChloraPrep (2% chlorhexidine  gluconate and 70% isopropyl alcohol) Prep Area: Entire Posterior Lumbosacral Area.  From the lower tip of the scapula down to the tailbone and from flank to flank. Materials:  Tray: Block Needle(s):  Type: Spinal  Gauge (G): 22  Length: 5-in  Qty: 1     H&P (Pre-op Assessment):  Stacey Nichols is a 63 y.o. (year old), female patient, seen today for interventional treatment. 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 Stacey Nichols has a current medication list which includes the following prescription(s): acetaminophen , albuterol , aspirin, b complex vitamins, biotin , cholecalciferol , cyclobenzaprine , metformin , multivitamin, pantoprazole , restasis, rosuvastatin , tirzepatide , tirzepatide , trazodone , and methylprednisolone . Her primarily concern today is the Back Pain (Left lower )  Initial Vital Signs:  Pulse/HCG Rate: 80ECG Heart Rate: 88 Temp: (!) 96.8 F (36 C) Resp: 16 BP: (!) 138/97 SpO2: 93 %  BMI: Estimated body mass index is 44.63 kg/m as calculated from the following:   Height as of this encounter: 5' 2 (1.575 m).   Weight as of this encounter: 244 lb (110.7 kg).  Risk Assessment: Allergies: Reviewed. She is allergic to latex, sulfa antibiotics, and tape.  Allergy Precautions: None required Coagulopathies: Reviewed. None identified.  Blood-thinner therapy: None at this time Active Infection(s): Reviewed. None identified. Stacey Nichols is afebrile  Site Confirmation: Stacey Nichols was asked to confirm the procedure and laterality before marking the site Procedure checklist: Completed Consent: Before the procedure and under the influence of no sedative(s), amnesic(s), or anxiolytics, the patient was informed of the treatment options, risks and possible complications. To fulfill our ethical and legal obligations, as recommended by the American Medical Association's Code of Ethics, I have informed the patient of my clinical impression; the nature and purpose of the treatment or procedure; the risks, benefits, and  possible complications of the intervention; the alternatives, including doing  nothing; the risk(s) and benefit(s) of the alternative treatment(s) or procedure(s); and the risk(s) and benefit(s) of doing nothing. The patient was provided information about the general risks and possible complications associated with the procedure. These may include, but are not limited to: failure to achieve desired goals, infection, bleeding, organ or nerve damage, allergic reactions, paralysis, and death. In addition, the patient was informed of those risks and complications associated to Spine-related procedures, such as failure to decrease pain; infection (i.e.: Meningitis, epidural or intraspinal abscess); bleeding (i.e.: epidural hematoma, subarachnoid hemorrhage, or any other type of intraspinal or peri-dural bleeding); organ or nerve damage (i.e.: Any type of peripheral nerve, nerve root, or spinal cord injury) with subsequent damage to sensory, motor, and/or autonomic systems, resulting in permanent pain, numbness, and/or weakness of one or several areas of the body; allergic reactions; (i.e.: anaphylactic reaction); and/or death. Furthermore, the patient was informed of those risks and complications associated with the medications. These include, but are not limited to: allergic reactions (i.e.: anaphylactic or anaphylactoid reaction(s)); adrenal axis suppression; blood sugar elevation that in diabetics may result in ketoacidosis or comma; water retention that in patients with history of congestive heart failure may result in shortness of breath, pulmonary edema, and decompensation with resultant heart failure; weight gain; swelling or edema; medication-induced neural toxicity; particulate matter embolism and blood vessel occlusion with resultant organ, and/or nervous system infarction; and/or aseptic necrosis of one or more joints. Finally, the patient was informed that Medicine is not an exact science; therefore, there  is also the possibility of unforeseen or unpredictable risks and/or possible complications that may result in a catastrophic outcome. The patient indicated having understood very clearly. We have given the patient no guarantees and we have made no promises. Enough time was given to the patient to ask questions, all of which were answered to the patient's satisfaction. Ms. Poblano has indicated that she wanted to continue with the procedure. Attestation: I, the ordering provider, attest that I have discussed with the patient the benefits, risks, side-effects, alternatives, likelihood of achieving goals, and potential problems during recovery for the procedure that I have provided informed consent. Date  Time: 02/25/2024  8:08 AM  Pre-Procedure Preparation:  Monitoring: As per clinic protocol. Respiration, ETCO2, SpO2, BP, heart rate and rhythm monitor placed and checked for adequate function Safety Precautions: Patient was assessed for positional comfort and pressure points before starting the procedure. Time-out: I initiated and conducted the Time-out before starting the procedure, as per protocol. The patient was asked to participate by confirming the accuracy of the Time Out information. Verification of the correct person, site, and procedure were performed and confirmed by me, the nursing staff, and the patient. Time-out conducted as per Joint Commission's Universal Protocol (UP.01.01.01). Time: 0843 Start Time: 0843 hrs.  Description/Narrative of Procedure:          Target: The 6 o'clock position under the pedicle, on the affected side. Region: Posterolateral Lumbosacral Approach: Posterior Percutaneous Paravertebral approach.  Rationale (medical necessity): procedure needed and proper for the diagnosis and/or treatment of the patient's medical symptoms and needs. Procedural Technique Safety Precautions: Aspiration looking for blood return was conducted prior to all injections. At no point  did we inject any substances, as a needle was being advanced. No attempts were made at seeking any paresthesias. Safe injection practices and needle disposal techniques used. Medications properly checked for expiration dates. SDV (single dose vial) medications used. Description of the Procedure: Protocol guidelines were followed. The patient was  placed in position over the procedure table. The target area was identified and the area prepped in the usual manner. Skin & deeper tissues infiltrated with local anesthetic. Appropriate amount of time allowed to pass for local anesthetics to take effect. The procedure needles were then advanced to the target area. Proper needle placement secured. Negative aspiration confirmed. Solution injected in intermittent fashion, asking for systemic symptoms every 0.5cc of injectate. The needles were then removed and the area cleansed, making sure to leave some of the prepping solution back to take advantage of its long term bactericidal properties.  Vitals:   02/25/24 0816 02/25/24 0842  BP: (!) 138/97 (!) 146/109  Pulse: 80   Resp: 16 18  Temp: (!) 96.8 F (36 C)   TempSrc: Temporal   SpO2: 93% 99%  Weight: 244 lb (110.7 kg)   Height: 5' 2 (1.575 m)      Start Time: 0843 hrs. End Time: 0846 hrs.  Imaging Guidance (Spinal):          Type of Imaging Technique: Fluoroscopy Guidance (Spinal) Indication(s): Fluoroscopy guidance for needle placement to enhance accuracy in procedures requiring precise needle localization for targeted delivery of medication in or near specific anatomical locations not easily accessible without such real-time imaging assistance. Exposure Time: Please see nurses notes. Contrast: Before injecting any contrast, we confirmed that the patient did not have an allergy to iodine, shellfish, or radiological contrast. Once satisfactory needle placement was completed at the desired level, radiological contrast was injected. Contrast injected  under live fluoroscopy. No contrast complications. See chart for type and volume of contrast used. Fluoroscopic Guidance: I was personally present during the use of fluoroscopy. Tunnel Vision Technique used to obtain the best possible view of the target area. Parallax error corrected before commencing the procedure. Direction-depth-direction technique used to introduce the needle under continuous pulsed fluoroscopy. Once target was reached, antero-posterior, oblique, and lateral fluoroscopic projection used confirm needle placement in all planes. Images permanently stored in EMR. Interpretation: I personally interpreted the imaging intraoperatively. Adequate needle placement confirmed in multiple planes. Appropriate spread of contrast into desired area was observed. No evidence of afferent or efferent intravascular uptake. No intrathecal or subarachnoid spread observed. Permanent images saved into the patient's record.  Post-operative Assessment:  Post-procedure Vital Signs:  Pulse/HCG Rate: 8088 Temp: (!) 96.8 F (36 C) Resp: 18 BP: (!) 146/109 SpO2: 99 %  EBL: None  Complications: No immediate post-treatment complications observed by team, or reported by patient.  Note: The patient tolerated the entire procedure well. A repeat set of vitals were taken after the procedure and the patient was kept under observation following institutional policy, for this type of procedure. Post-procedural neurological assessment was performed, showing return to baseline, prior to discharge. The patient was provided with post-procedure discharge instructions, including a section on how to identify potential problems. Should any problems arise concerning this procedure, the patient was given instructions to immediately contact us , at any time, without hesitation. In any case, we plan to contact the patient by telephone for a follow-up status report regarding this interventional procedure.  Comments:  No  additional relevant information.  Plan of Care (POC)  Orders:  Orders Placed This Encounter  Procedures   DG PAIN CLINIC C-ARM 1-60 MIN NO REPORT    Intraoperative interpretation by procedural physician at [facility].    Standing Status:   Standing    Number of Occurrences:   1    Reason for exam::   Assistance  in needle guidance and placement for procedures requiring needle placement in or near specific anatomical locations not easily accessible without such assistance.     Medications ordered for procedure: Meds ordered this encounter  Medications   iohexol  (OMNIPAQUE ) 180 MG/ML injection 10 mL    Must be Myelogram-compatible. If not available, you may substitute with a water-soluble, non-ionic, hypoallergenic, myelogram-compatible radiological contrast medium.   lidocaine  (XYLOCAINE ) 2 % (with pres) injection 400 mg   diazepam  (VALIUM ) tablet 5 mg    Make sure Flumazenil is available in the pyxis when using this medication. If oversedation occurs, administer 0.2 mg IV over 15 sec. If after 45 sec no response, administer 0.2 mg again over 1 min; may repeat at 1 min intervals; not to exceed 4 doses (1 mg)   sodium chloride  flush (NS) 0.9 % injection 1 mL   ropivacaine  (PF) 2 mg/mL (0.2%) (NAROPIN ) injection 1 mL   dexamethasone  (DECADRON ) injection 10 mg   Medications administered: We administered iohexol , lidocaine , diazepam , sodium chloride  flush, ropivacaine  (PF) 2 mg/mL (0.2%), and dexamethasone .  See the medical record for exact dosing, route, and time of administration.    Left L3 TF ESI 01/21/24, 02/25/24    Follow-up plan:   Return in about 4 weeks (around 03/24/2024) for PPE, F2F.     Recent Visits Date Type Provider Dept  02/19/24 Office Visit Marcelino Nurse, MD [facility]  01/21/24 Procedure visit Marcelino Nurse, MD [facility]  01/06/24 Office Visit Marcelino Nurse, MD [facility]  Showing recent visits within past 90 days and  meeting all other requirements Today's Visits Date Type Provider Dept  02/25/24 Procedure visit Marcelino Nurse, MD [facility]  Showing today's visits and meeting all other requirements Future Appointments Date Type Provider Dept  03/25/24 Appointment Marcelino Nurse, MD [facility]  Showing future appointments within next 90 days and meeting all other requirements   Disposition: Discharge home  Discharge (Date  Time): 02/25/2024; 0900 hrs.   Primary Care Physician: Rilla Baller, MD Location: [facility] Note by: Nurse Marcelino, MD (TTS technology used. I apologize for any typographical errors that were not detected and corrected.) Date: 02/25/2024; Time: 8:57 AM  Disclaimer:  Medicine is not an Visual merchandiser. The only guarantee in medicine is that nothing is guaranteed. It is important to note that the decision to proceed with this intervention was based on the information collected from the patient. The Data and conclusions were drawn from the patient's questionnaire, the interview, and the physical examination. Because the information was provided in large part by the patient, it cannot be guaranteed that it has not been purposely or unconsciously manipulated. Every effort has been made to obtain as much relevant data as possible for this evaluation. It is important to note that the conclusions that lead to this procedure are derived in large part from the available data. Always take into account that the treatment will also be dependent on availability of resources and existing treatment guidelines, considered by other Pain Management Practitioners as being common knowledge and practice, at the time of the intervention. For Medico-Legal purposes, it is also important to point out that variation in procedural techniques and pharmacological choices are the acceptable norm. The indications, contraindications, technique, and results of the above  procedure should only be interpreted and judged by a Board-Certified Interventional Pain Specialist with extensive familiarity and expertise in the same exact procedure and technique.

## 2024-02-25 NOTE — Patient Instructions (Signed)
 Pain Management Discharge Instructions  General Discharge Instructions :  If you need to reach your doctor call: Monday-Friday 8:00 am - 4:00 pm at [phone] or toll free [phone].  After clinic hours [phone] to have operator reach doctor.  Bring all of your medication bottles to all your appointments in the pain clinic.  To cancel or reschedule your appointment with Pain Management please remember to call 24 hours in advance to avoid a fee.  Refer to the educational materials which you have been given on: General Risks, I had my Procedure. Discharge Instructions, Post Sedation.  Post Procedure Instructions:  The drugs you were given will stay in your system until tomorrow, so for the next 24 hours you should not drive, make any legal decisions or drink any alcoholic beverages.  You may eat anything you prefer, but it is better to start with liquids then soups and crackers, and gradually work up to solid foods.  Please notify your doctor immediately if you have any unusual bleeding, trouble breathing or pain that is not related to your normal pain.  Depending on the type of procedure that was done, some parts of your body may feel week and/or numb.  This usually clears up by tonight or the next day.  Walk with the use of an assistive device or accompanied by an adult for the 24 hours.  You may use ice on the affected area for the first 24 hours.  Put ice in a Ziploc bag and cover with a towel and place against area 15 minutes on 15 minutes off.  You may switch to heat after 24 hours.Selective Nerve Root Block Patient Information  Description: Specific nerve roots exit the spinal canal and these nerves can be compressed and inflamed by a bulging disc and bone spurs.  By injecting steroids on the nerve root, we can potentially decrease the inflammation surrounding these nerves, which often leads to decreased pain.  Also, by injecting local anesthesia on the nerve root, this can  provide [place] helpful information to give to your referring doctor if it decreases your pain.  Selective nerve root blocks can be done along the spine from the neck to the low back depending on the location of your pain.   After numbing the skin with local anesthesia, a small needle is passed to the nerve root and the position of the needle is verified using x-ray pictures.  After the needle is in correct position, we then deposit the medication.  You may experience a pressure sensation while this is being done.  The entire block usually lasts less than 15 minutes.  Conditions that may be treated with selective nerve root blocks: Low back and leg pain Spinal stenosis Diagnostic block prior to potential surgery Neck and arm pain Post laminectomy syndrome  Preparation for the injection:  Do not eat any solid food or dairy products within 8 hours of your appointment. You may drink clear liquids up to 3 hours before an appointment.  Clear liquids include water, black coffee, juice or soda.  No milk or cream please. You may take your regular medications, including pain medications, with a sip of water before your appointment.  Diabetics should hold regular insulin (if taken separately) and take 1/2 normal NPH dose the morning of the procedure.  Carry some sugar containing items with you to your appointment. A driver must accompany you and be prepared to drive you home after your procedure. Bring all your current medications with you. An IV  may be inserted and sedation may be given at the discretion of the physician. A blood pressure cuff, EKG, and other monitors will often be applied during the procedure.  Some patients may need to have extra oxygen administered for a short period. You will be asked to provide medical information, including allergies, prior to the procedure.  We must know immediately if you are taking blood  Thinners (like Coumadin) or if you are allergic to IV iodine contrast  (dye).  Possible side-effects: All are usually temporary Bleeding from needle site Light headedness Numbness and tingling Decreased blood pressure Weakness in arms/legs Pressure sensation in back/neck Pain at injection site (several days)  Possible complications: All are extremely rare Infection Nerve injury Spinal headache (a headache wore with upright position)  Call if you experience: Fever/chills associated with headache or increased back/neck pain Headache worsened by an upright position New onset weakness or numbness of an extremity below the injection site Hives or difficulty breathing (go to the emergency room) Inflammation or drainage at the injection site(s) Severe back/neck pain greater than usual New symptoms which are concerning to you  Please note:  Although the local anesthetic injected can often make your back or neck feel good for several hours after the injection the pain will likely return.  It takes 3-5 days for steroids to work on the nerve root. You may not notice any pain relief for at least one week.  If effective, we will often do a series of 3 injections spaced 3-6 weeks apart to maximally decrease your pain.    If you have any questions, please call [phone] [facility]

## 2024-02-26 ENCOUNTER — Telehealth: Payer: Self-pay | Admitting: *Deleted

## 2024-02-26 NOTE — Telephone Encounter (Signed)
Post procedure call;  patient reports that she is doing well, no questions or concerns.

## 2024-02-27 ENCOUNTER — Ambulatory Visit

## 2024-03-01 ENCOUNTER — Encounter: Payer: Self-pay | Admitting: Orthopedic Surgery

## 2024-03-01 ENCOUNTER — Ambulatory Visit (INDEPENDENT_AMBULATORY_CARE_PROVIDER_SITE_OTHER): Admitting: Orthopedic Surgery

## 2024-03-01 VITALS — BP 120/78 | Ht 62.0 in | Wt 248.5 lb

## 2024-03-01 DIAGNOSIS — M48061 Spinal stenosis, lumbar region without neurogenic claudication: Secondary | ICD-10-CM

## 2024-03-01 DIAGNOSIS — M5126 Other intervertebral disc displacement, lumbar region: Secondary | ICD-10-CM

## 2024-03-01 DIAGNOSIS — M4120 Other idiopathic scoliosis, site unspecified: Secondary | ICD-10-CM

## 2024-03-01 DIAGNOSIS — M5416 Radiculopathy, lumbar region: Secondary | ICD-10-CM

## 2024-03-01 DIAGNOSIS — M4186 Other forms of scoliosis, lumbar region: Secondary | ICD-10-CM | POA: Diagnosis not present

## 2024-03-01 DIAGNOSIS — M4316 Spondylolisthesis, lumbar region: Secondary | ICD-10-CM

## 2024-03-01 DIAGNOSIS — M47816 Spondylosis without myelopathy or radiculopathy, lumbar region: Secondary | ICD-10-CM

## 2024-03-02 ENCOUNTER — Other Ambulatory Visit (HOSPITAL_COMMUNITY): Payer: Self-pay

## 2024-03-02 ENCOUNTER — Telehealth: Payer: Self-pay

## 2024-03-02 NOTE — Telephone Encounter (Signed)
 Mounjaro  is approved exclusively as an adjunct to diet and exercise to improve glycemic  control in adults with type 2 diabetes mellitus. A review of patient's medical chart reveals no  documented diagnosis of type 2 diabetes or an A1C 6.5% or higher indicative of diabetes. Therefore, they do not  currently meet the criteria for prior authorization of this medication. If clinically appropriate, Wegovy  may be considered for this patient. We can also try Zepbound  if diagnosed OSA with sleep study report.

## 2024-03-03 ENCOUNTER — Other Ambulatory Visit (HOSPITAL_COMMUNITY): Payer: Self-pay

## 2024-03-09 NOTE — Telephone Encounter (Signed)
 She did have 1 hour OGTT of 236. She also had random sugar 212 on CMP from 09/24/2023.  With 2 documented sugars >200 I did diagnose with T2DM.  Can we use this diagnosis to see if mounjaro  will be covered? Thank you.

## 2024-03-10 ENCOUNTER — Ambulatory Visit

## 2024-03-17 ENCOUNTER — Encounter

## 2024-03-23 ENCOUNTER — Other Ambulatory Visit (HOSPITAL_COMMUNITY): Payer: Self-pay

## 2024-03-23 ENCOUNTER — Telehealth: Payer: Self-pay

## 2024-03-23 NOTE — Telephone Encounter (Signed)
 Pharmacy Patient Advocate Encounter   Received notification from Onbase that prior authorization for Mounjaro  2.5 is due for renewal.   Insurance verification completed.   The patient is insured through [facility] MEDICAID.  Action: PA required; PA submitted to above mentioned insurance via Latent Key/confirmation #/EOC [id number] Status is pending

## 2024-03-24 ENCOUNTER — Other Ambulatory Visit (HOSPITAL_COMMUNITY): Payer: Self-pay

## 2024-03-24 NOTE — Telephone Encounter (Signed)
 Pharmacy Patient Advocate Encounter  Received notification from [facility] MEDICAID that Prior Authorization for Mounjaro  2.5 has been DENIED.  Full denial letter will be uploaded to the media tab. See denial reason below.     PA #/Case ID/Reference #: [id number]

## 2024-03-25 ENCOUNTER — Ambulatory Visit
Attending: Student in an Organized Health Care Education/Training Program | Admitting: Student in an Organized Health Care Education/Training Program

## 2024-03-25 ENCOUNTER — Encounter: Payer: Self-pay | Admitting: Student in an Organized Health Care Education/Training Program

## 2024-03-25 VITALS — BP 89/74 | HR 83 | Temp 97.2°F | Resp 18 | Ht 62.0 in | Wt 249.0 lb

## 2024-03-25 DIAGNOSIS — G8929 Other chronic pain: Secondary | ICD-10-CM | POA: Insufficient documentation

## 2024-03-25 DIAGNOSIS — M48061 Spinal stenosis, lumbar region without neurogenic claudication: Secondary | ICD-10-CM | POA: Insufficient documentation

## 2024-03-25 DIAGNOSIS — Z6841 Body Mass Index (BMI) 40.0 and over, adult: Secondary | ICD-10-CM | POA: Insufficient documentation

## 2024-03-25 DIAGNOSIS — M5416 Radiculopathy, lumbar region: Secondary | ICD-10-CM | POA: Insufficient documentation

## 2024-03-25 DIAGNOSIS — E66813 Obesity, class 3: Secondary | ICD-10-CM | POA: Insufficient documentation

## 2024-03-25 NOTE — Progress Notes (Signed)
 Safety precautions to be maintained throughout the outpatient stay will include: orient to surroundings, keep bed in low position, maintain call bell within reach at all times, provide assistance with transfer out of bed and ambulation.

## 2024-03-25 NOTE — Progress Notes (Signed)
 Interpreter services used.[id number]

## 2024-03-25 NOTE — Telephone Encounter (Signed)
 Can we resubmit with diagnosis of diabetes? 1 hour OGTT of 236 (10/2023).  She also had random sugar 212 on CMP from 09/24/2023.  This is documented in chart.

## 2024-03-25 NOTE — Patient Instructions (Signed)
 Preparing for Procedure with Sedation Instructions: . Oral Intake: Do not eat or drink anything for at least 8 hours prior to your procedure. . Transportation: Public transportation is not allowed. Bring an adult driver. The driver must be physically present in our waiting room before any procedure can be started. SABRA Physical Assistance: Bring an adult capable of physically assisting you, in the event you need help. . Blood Pressure Medicine: Take your blood pressure medicine with a sip of water the morning of the procedure. . Insulin : Take only  of your normal insulin  dose. . Preventing infections: Shower with an antibacterial soap the morning of your procedure. . Build-up your immune system: Take 1000 mg of Vitamin C with every meal (3 times a day) the day prior to your procedure. . Pregnancy: If you are pregnant, call and cancel the procedure. . Sickness: If you have a cold, fever, or any active infections, call and cancel the procedure. . Arrival: You must be in the facility at least 30 minutes prior to your scheduled procedure. . Children: Do not bring children with you. . Dress appropriately: Bring dark clothing that you would not mind if they get stained. . Valuables: Do not bring any jewelry or valuables. Procedure appointments are reserved for interventional treatments only. SABRA No Prescription Refills. . No medication changes will be discussed during procedure appointments. . No disability issues will be discussed. SABRA

## 2024-03-25 NOTE — Progress Notes (Signed)
 PROVIDER NOTE: Interpretation of information contained herein should be left to medically-trained personnel. Specific patient instructions are provided elsewhere under Patient Instructions section of medical record. This document was created in part using AI and STT-dictation technology, any transcriptional errors that may result from this process are unintentional.  Patient: Stacey Nichols  Service: E/M   PCP: Rilla Baller, MD  DOB: [birth year]  DOS: 03/25/2024  Provider: Wallie Sherry, MD  MRN: [id number]  Delivery: Face-to-face  Specialty: Interventional Pain Management  Type: Established Patient  Setting: Ambulatory outpatient facility  Specialty designation: 09  Referring Prov.: Rilla Baller, MD  Location: Outpatient office facility       History of present illness (HPI) Stacey Nichols, a 63 y.o. year old female, is here today because of her Lumbar radiculopathy [M54.16]. Stacey Nichols primary complain today is Back Pain (Radiates down left leg, numbness and tingling)  Pertinent problems: [name] does not have any pertinent problems on file.  Pain Assessment: Severity of Chronic pain is reported as a 4 /10. Location: Back Left/radiates down left leg. Onset: More than a month ago. Quality: Numbness, Tingling. Timing: Constant. Modifying factor(s): Denies. Vitals:  height is 5' 2 (1.575 m) and weight is 249 lb (112.9 kg). Her temporal temperature is 97.2 F (36.2 C) (abnormal). Her blood pressure is 89/74 (abnormal) and her pulse is 83. Her respiration is 18 and oxygen saturation is 100%.  BMI: Estimated body mass index is 45.54 kg/m as calculated from the following:   Height as of this encounter: 5' 2 (1.575 m).   Weight as of this encounter: 249 lb (112.9 kg).  Last encounter: 02/19/2024. Last procedure: 02/25/2024.  Reason for encounter:   History of Present Illness   Stacey Nichols is a 63 year old female who presents for follow-up after a left L3 nerve block.  After  the left L3 nerve block on August 13th, pain subsided significantly for five to six days before gradually returning. Overall pain has decreased, allowing her to reduce her pain medication intake by half.  She describes a new sensation in her left leg, characterized as a vibration or 'like a phone vibrating,' occurring more frequently and distinct from her usual pain. This sensation extends beyond the knee, unlike her previous pain which was localized above the knee.  She is managing her pain with medications and is working on weight loss, but has had difficulty obtaining weight loss injections due to not being declared diabetic. She mentions difficulty in obtaining weight loss injections due to not being declared diabetic.        Constitutional: Denies any fever or chills Gastrointestinal: No reported hemesis, hematochezia, vomiting, or acute GI distress Musculoskeletal: Denies any acute onset joint swelling, redness, loss of ROM, or weakness Neurological: No reported episodes of acute onset apraxia, aphasia, dysarthria, agnosia, amnesia, paralysis, loss of coordination, or loss of consciousness  Medication Review  Biotin , Cholecalciferol , acetaminophen , albuterol , aspirin, b complex vitamins, metFORMIN , multivitamin, naproxen , pantoprazole , and rosuvastatin   History Review  Allergy: [name] is allergic to latex, sulfa antibiotics, and tape. Drug: [name]  reports no history of drug use. Alcohol:  reports current alcohol use. Tobacco:  reports that she quit smoking about 5 years ago. Her smoking use included cigarettes. She started smoking about 36 years ago. She has never used smokeless tobacco. Social: [name]  reports that she quit smoking about 5 years ago. Her smoking use included cigarettes. She started smoking about 36 years ago. She has  never used smokeless tobacco. She reports current alcohol use. She reports that she does not use drugs. Medical:  has a past medical history  of AML (acute myeloblastic leukemia) (HCC) (2007), Anemia, Anxiety, Arthritis, Constipation, COVID-19 virus infection (07/04/2021), Depression, Diabetes mellitus without complication (HCC), Fatty liver, GERD (gastroesophageal reflux disease), H/O Bell's palsy, H/O hiatal hernia, Headache, High triglycerides, History of cancer chemotherapy, Leukemia (HCC), Morbidly obese (HCC), Osteopenia (06/08/2017), Pneumonia, Rheumatoid arthritis (HCC), Sleep apnea, and Stroke (HCC). Surgical: [name]  has a past surgical history that includes Laparoscopic gastric banding (2000); Tubal ligation (1996); Abdominal hysterectomy (2001); Upper gi endoscopy (N/A, 04/26/2014); Laparoscopic gastric band removal with laparoscopic gastric sleeve resection (04/26/2014); Blepharoplasty (Bilateral, 06/2020); Facial cosmetic surgery (06/2020); Colonoscopy with propofol  (N/A, 09/19/2020); Replacement total knee (Left, 10/2017); and Replacement total knee (Right, 06/2018). Family: family history includes CAD in her father; Cancer in her mother and sister; Diabetes in her maternal grandfather; Heart Problems in her father; Hyperlipidemia in her father; Obesity in her father.  Laboratory Chemistry Profile   Renal Lab Results  Component Value Date   BUN 23 09/24/2023   CREATININE 0.92 09/24/2023   BCR 27 (H) 09/04/2017   GFR 67.11 09/24/2023   GFRAA 115 09/04/2017   GFRNONAA >60 02/13/2022    Hepatic Lab Results  Component Value Date   AST 22 10/08/2023   ALT 28 10/08/2023   ALBUMIN 4.0 10/08/2023   ALKPHOS 75 10/08/2023   HCVAB NEGATIVE 12/25/2016    Electrolytes Lab Results  Component Value Date   NA 141 09/24/2023   K 4.0 09/24/2023   CL 104 09/24/2023   CALCIUM  9.5 09/24/2023   MG 1.8 03/12/2023    Bone Lab Results  Component Value Date   VD25OH 30.42 09/24/2023    Inflammation (CRP: Acute Phase) (ESR: Chronic Phase) Lab Results  Component Value Date   CRP <1.0 05/21/2023   ESRSEDRATE 14 05/21/2023    LATICACIDVEN 1.8 02/13/2022         Note: Above Lab results reviewed.  Recent Imaging Review  CLINICAL DATA:  Lumbar radiculopathy with symptoms persisting over 6 weeks of treatment   EXAM: MRI LUMBAR SPINE WITHOUT CONTRAST   TECHNIQUE: Multiplanar, multisequence MR imaging of the lumbar spine was performed. No intravenous contrast was administered.   COMPARISON:  None Available.   FINDINGS: Segmentation:  Standard.   Alignment:  Trace scoliosis and mild L4-5 anterolisthesis   Vertebrae: No fracture, evidence of discitis, or bone lesion. Mild degenerative superior endplate edema at L4   Conus medullaris and cauda equina: Conus extends to the L1-2 level. Conus and cauda equina appear normal.   Paraspinal and other soft tissues: No perispinal mass or inflammation.   Disc levels:   T12- L1: Disc bulging and right facet spurring.   L1-L2: Circumferential disc bulging.  Negative facets   L2-L3: Disc bulging with narrowing and desiccation. Small central protrusion. Mild degenerative facet spurring.   L3-L4: Disc narrowing and bulging with moderate facet spurring. Degeneration and epidural fat expansion causes moderate thecal sac stenosis, 6 mm in the midline. Left foraminal to extraforaminal herniation contacting the left L3 nerve root.   L4-L5: Disc narrowing and bulging with tiny central protrusion. Degenerative facet spurring with ligamentum flavum thickening greater on the right. Epidural fat and degeneration causes moderate thecal sac stenosis. Patent foramina.   L5-S1:Disc desiccation and narrowing with small central protrusion. There is a right foraminal protrusion up lifting the L5 nerve root. Degenerative facet spurring greater on the right.  IMPRESSION: Generalized lumbar spine degeneration with scoliosis.   Focal foraminal impingement on the right at L5-S1 mainly due to disc protrusion.   On the symptomatic left side there is a L3-4 left  far-lateral herniation which could affect the left L3 nerve root.   Degeneration and epidural fat causes moderate thecal sac stenosis at L3-4 and L4-5.     Electronically Signed By: Dorn Roulette M.D. On: 12/05/2023 08:11 Note: Reviewed       Note: Reviewed        Physical Exam  Vitals: BP (!) 89/74 (BP Location: Right Arm, Patient Position: Sitting)   Pulse 83   Temp (!) 97.2 F (36.2 C) (Temporal)   Resp 18   Ht 5' 2 (1.575 m)   Wt 249 lb (112.9 kg)   SpO2 100%   BMI 45.54 kg/m  BMI: Estimated body mass index is 45.54 kg/m as calculated from the following:   Height as of this encounter: 5' 2 (1.575 m).   Weight as of this encounter: 249 lb (112.9 kg). Ideal: Ideal body weight: 50.1 kg (110 lb 7.2 oz) Adjusted ideal body weight: 75.2 kg (165 lb 13.9 oz) General appearance: Well nourished, well developed, and well hydrated. In no apparent acute distress Mental status: Alert, oriented x 3 (person, place, & time)       Respiratory: No evidence of acute respiratory distress Eyes: PERLA  Lumbar Spine Area Exam  Skin & Axial Inspection: No masses, redness, or swelling Alignment: Symmetrical Functional ROM: Pain restricted ROM affecting primarily the left Stability: No instability detected Muscle Tone/Strength: Functionally intact. No obvious neuro-muscular anomalies detected. Sensory (Neurological): Dermatomal pain pattern Palpation: No palpable anomalies       Provocative Tests: Hyperextension/rotation test: deferred today       Lumbar quadrant test (Kemp's test): (+) on the left for foraminal stenosis Lateral bending test: (+) ipsilateral radicular pain, on the left. Positive for left-sided foraminal stenosis.   Gait & Posture Assessment  Ambulation: Patient ambulates using a cane Gait: Antalgic gait (limping) Posture: Difficulty standing up straight, due to pain  Lower Extremity Exam      Side: Right lower extremity   Side: Left lower extremity  Stability: No  instability observed           Stability: No instability observed          Skin & Extremity Inspection: Skin color, temperature, and hair growth are WNL. No peripheral edema or cyanosis. No masses, redness, swelling, asymmetry, or associated skin lesions. No contractures.   Skin & Extremity Inspection: Skin color, temperature, and hair growth are WNL. No peripheral edema or cyanosis. No masses, redness, swelling, asymmetry, or associated skin lesions. No contractures.  Functional ROM: Unrestricted ROM                   Functional ROM: Pain restricted ROM for all joints of the lower extremity          Muscle Tone/Strength: Functionally intact. No obvious neuro-muscular anomalies detected.   Muscle Tone/Strength: Functionally intact. No obvious neuro-muscular anomalies detected.  Sensory (Neurological): Unimpaired         Sensory (Neurological): Unimpaired        DTR: Patellar: deferred today Achilles: deferred today Plantar: deferred today   DTR: Patellar: deferred today Achilles: deferred today Plantar: deferred today  Palpation: No palpable anomalies   Palpation: No palpable anomalies       Assessment   Diagnosis Status  1. Lumbar radiculopathy (LEFT L3)  2. Chronic radicular lumbar pain   3. Lumbar foraminal stenosis   4. Class 3 severe obesity with serious comorbidity and body mass index (BMI) of 40.0 to 44.9 in adult, unspecified obesity type    Controlled Controlled Controlled   Updated Problems: No problems updated.  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 Significant pain relief followed the left L3 nerve block on August 13th, lasting five to six days, with pain returning at reduced intensity. Her pain medication use is now about half of what it was before the injection. New symptoms include  a vibration sensation in the left leg, possibly due to nerve dysfunction. Current management focuses on pain control and weight loss to alleviate nerve compression. Surgery is a last resort, and she prefers to avoid it. A spinal cord stimulator is a potential option, with insurance coverage considerations. The stimulator would be tested for seven days before permanent implantation. Medicaid typically requires surgery before covering the stimulator, but [employer] may cover it without prior surgery. Administer steroid injections every three to four months, with the next scheduled for early November. Consider increasing the volume of the next injection to extend its duration. Encourage weight loss to reduce nerve compression. Discuss the spinal cord stimulator if pain management becomes inadequate, considering insurance coverage. Monitor for worsening symptoms that may necessitate surgery.       Stacey Nichols has a current medication list which includes the following long-term medication(s): albuterol , metformin , pantoprazole , and rosuvastatin .  Pharmacotherapy (Medications Ordered): No orders of the defined types were placed in this encounter.  Orders:  Orders Placed This Encounter  Procedures   Lumbar Transforaminal Epidural    Standing Status:   Future    Expected Date:   05/26/2024    Expiration Date:   03/25/2025    Scheduling Instructions:     Laterality: LEFT L3             Sedation: Patient's choice.     Timeframe: As soon as schedule allows.    Where will this procedure be performed?:   [facility]     Left L3 TF ESI 01/21/24, 02/25/24    Return in about 2 months (around 05/26/2024) for Left L3 TF ESI, in clinic (PO Valium  5mg ).    Recent Visits Date Type Provider Dept  02/25/24 Procedure visit Marcelino Nurse, MD [facility]  02/19/24 Office Visit Marcelino Nurse, MD [facility]  01/21/24 Procedure visit Marcelino Nurse, MD [facility]   01/06/24 Office Visit Marcelino Nurse, MD [facility]  Showing recent visits within past 90 days and meeting all other requirements Today's Visits Date Type Provider Dept  03/25/24 Office Visit Marcelino Nurse, MD [facility]  Showing today's visits and meeting all other requirements Future Appointments No visits were found meeting these conditions. Showing future appointments within next 90 days and meeting all other requirements  I discussed the assessment and treatment plan with the patient. The patient was provided an opportunity to ask questions and all were answered. The patient agreed with the plan and demonstrated an understanding of the instructions.  Patient advised to call back or seek an in-person evaluation if the symptoms or condition worsens.  Duration of encounter: .  Total time on encounter, as per AMA guidelines included both the face-to-face and non-face-to-face  time personally spent by the physician and/or other qualified health care professional(s) on the day of the encounter (includes time in activities that require the physician or other qualified health care professional and does not include time in activities normally performed by clinical staff). Physician's time may include the following activities when performed: Preparing to see the patient (e.g., pre-charting review of records, searching for previously ordered imaging, lab work, and nerve conduction tests) Review of prior analgesic pharmacotherapies. Reviewing PMP Interpreting ordered tests (e.g., lab work, imaging, nerve conduction tests) Performing post-procedure evaluations, including interpretation of diagnostic procedures Obtaining and/or reviewing separately obtained history Performing a medically appropriate examination and/or evaluation Counseling and educating the patient/family/caregiver Ordering medications, tests, or procedures Referring and communicating with other health  care professionals (when not separately reported) Documenting clinical information in the electronic or other health record Independently interpreting results (not separately reported) and communicating results to the patient/ family/caregiver Care coordination (not separately reported)  Note by: Wallie Sherry, MD (TTS and AI technology used. I apologize for any typographical errors that were not detected and corrected.) Date: 03/25/2024; Time: 11:28 AM

## 2024-03-26 ENCOUNTER — Encounter: Payer: Self-pay | Admitting: Family Medicine

## 2024-03-26 ENCOUNTER — Ambulatory Visit: Admitting: Family Medicine

## 2024-03-26 VITALS — BP 118/62 | HR 70 | Temp 98.7°F | Ht 62.0 in | Wt 249.4 lb

## 2024-03-26 DIAGNOSIS — Z7984 Long term (current) use of oral hypoglycemic drugs: Secondary | ICD-10-CM

## 2024-03-26 DIAGNOSIS — Z23 Encounter for immunization: Secondary | ICD-10-CM

## 2024-03-26 DIAGNOSIS — R0601 Orthopnea: Secondary | ICD-10-CM

## 2024-03-26 DIAGNOSIS — E785 Hyperlipidemia, unspecified: Secondary | ICD-10-CM

## 2024-03-26 DIAGNOSIS — Z8669 Personal history of other diseases of the nervous system and sense organs: Secondary | ICD-10-CM

## 2024-03-26 DIAGNOSIS — E1169 Type 2 diabetes mellitus with other specified complication: Secondary | ICD-10-CM | POA: Diagnosis not present

## 2024-03-26 DIAGNOSIS — C9201 Acute myeloblastic leukemia, in remission: Secondary | ICD-10-CM

## 2024-03-26 DIAGNOSIS — G4733 Obstructive sleep apnea (adult) (pediatric): Secondary | ICD-10-CM | POA: Diagnosis not present

## 2024-03-26 DIAGNOSIS — Z6841 Body Mass Index (BMI) 40.0 and over, adult: Secondary | ICD-10-CM

## 2024-03-26 DIAGNOSIS — G500 Trigeminal neuralgia: Secondary | ICD-10-CM

## 2024-03-26 LAB — LIPID PANEL
Cholesterol: 113 mg/dL (ref 0–200)
HDL: 48.9 mg/dL (ref >39.00)
LDL Cholesterol: 34 mg/dL (ref 0–99)
NonHDL: 63.86
Total CHOL/HDL Ratio: 2
Triglycerides: 147 mg/dL (ref 0.0–149.0)
VLDL: 29.4 mg/dL (ref 0.0–40.0)

## 2024-03-26 LAB — CBC WITH DIFFERENTIAL/PLATELET
Basophils Absolute: 0 K/uL (ref 0.0–0.1)
Basophils Relative: 0.5 % (ref 0.0–3.0)
Eosinophils Absolute: 0.2 K/uL (ref 0.0–0.7)
Eosinophils Relative: 4 % (ref 0.0–5.0)
HCT: 43.9 % (ref 36.0–46.0)
Hemoglobin: 14.5 g/dL (ref 12.0–15.0)
Lymphocytes Relative: 38.9 % (ref 12.0–46.0)
Lymphs Abs: 1.9 K/uL (ref 0.7–4.0)
MCHC: 33.1 g/dL (ref 30.0–36.0)
MCV: 97.5 fl (ref 78.0–100.0)
Monocytes Absolute: 0.6 K/uL (ref 0.1–1.0)
Monocytes Relative: 11.1 % (ref 3.0–12.0)
Neutro Abs: 2.3 K/uL (ref 1.4–7.7)
Neutrophils Relative %: 45.5 % (ref 43.0–77.0)
Platelets: 177 K/uL (ref 150.0–400.0)
RBC: 4.51 Mil/uL (ref 3.87–5.11)
RDW: 14 % (ref 11.5–15.5)
WBC: 5 K/uL (ref 4.0–10.5)

## 2024-03-26 LAB — COMPREHENSIVE METABOLIC PANEL WITH GFR
ALT: 30 U/L (ref 0–35)
AST: 27 U/L (ref 0–37)
Albumin: 4.5 g/dL (ref 3.5–5.2)
Alkaline Phosphatase: 60 U/L (ref 39–117)
BUN: 17 mg/dL (ref 6–23)
CO2: 30 meq/L (ref 19–32)
Calcium: 9.6 mg/dL (ref 8.4–10.5)
Chloride: 106 meq/L (ref 96–112)
Creatinine, Ser: 0.82 mg/dL (ref 0.40–1.20)
GFR: 76.78 mL/min (ref >60.00)
Glucose, Bld: 109 mg/dL — ABNORMAL HIGH (ref 70–99)
Potassium: 4.1 meq/L (ref 3.5–5.1)
Sodium: 144 meq/L (ref 135–145)
Total Bilirubin: 0.7 mg/dL (ref 0.2–1.2)
Total Protein: 6.8 g/dL (ref 6.0–8.3)

## 2024-03-26 LAB — POCT GLYCOSYLATED HEMOGLOBIN (HGB A1C): Hemoglobin A1C: 6.1 % — AB (ref 4.0–5.6)

## 2024-03-26 LAB — URIC ACID: Uric Acid, Serum: 6.4 mg/dL (ref 2.4–7.0)

## 2024-03-26 LAB — TSH: TSH: 2.74 u[IU]/mL (ref 0.35–5.50)

## 2024-03-26 LAB — MAGNESIUM: Magnesium: 1.9 mg/dL (ref 1.5–2.5)

## 2024-03-26 MED ORDER — TIRZEPATIDE 2.5 MG/0.5ML ~~LOC~~ SOAJ: 2.5000 mg | SUBCUTANEOUS | 0 refills | Status: DC

## 2024-03-26 MED ORDER — TIRZEPATIDE 5 MG/0.5ML ~~LOC~~ SOAJ: 5.0000 mg | SUBCUTANEOUS | 1 refills | Status: DC

## 2024-03-26 MED ORDER — ROSUVASTATIN CALCIUM 10 MG PO TABS
10.0000 mg | ORAL_TABLET | ORAL | 3 refills | Status: AC
Start: 2024-03-26 — End: ?

## 2024-03-26 NOTE — Assessment & Plan Note (Signed)
 Latest echo 2021 reviewed.  Consider updated echocardiogram given endorse symptoms of increasing DOE, orthopnea.

## 2024-03-26 NOTE — Patient Instructions (Addendum)
 Vacuna contra flu hoy.  Laboratorios hoy.  Hemos vuelto a pedir Mounjaro .  La remitire de nuevo a Dr Maree para seguimiento de apnea de sueo.  La remitire a Scientist, research (life sciences).

## 2024-03-26 NOTE — Assessment & Plan Note (Signed)
 Followed by neurology Anastacio), last seen 2019. Unsure CPAP settings.  Notes worsening headaches. Will refer back to [facility] neurology.

## 2024-03-26 NOTE — Telephone Encounter (Signed)
 Have started auth with new diagnosis. Sent in lab results pending for review.

## 2024-03-26 NOTE — Assessment & Plan Note (Addendum)
 Type 2 diabetes diagnosed based on 1 hour OGTT of 236 (10/2023) and random sugar 212 on CMP from 09/24/2023.  She is on metformin  500m daily.  Awaiting Mounjaro  eligibility determination by her insurance - PA re-submitted today.  Will also refer to diabetes education classes - preferably to spanish speaking provider RTC 3 mo DM f/u visit.

## 2024-03-26 NOTE — Assessment & Plan Note (Addendum)
 H/o sw[place] lap band in [place] 2000s 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  New diabetes diagnosis as per below.  Pending PA - insurance approval. This was resubmitted today.  Obesity complicated by comorbidities of OSA, DM, OA, dyslipidemia  Baseline BMI 45.61 (03/2024)

## 2024-03-26 NOTE — Telephone Encounter (Signed)
 Called patient let her know she can pick up. She is to start the 2.5mg  for a month then go up to 5mg . She will call and let us  know how she is doing. She did not have any questions at this time.

## 2024-03-26 NOTE — Assessment & Plan Note (Signed)
 Chronic. Update levels on Crestor  MWF.  The ASCVD Risk score (Arnett DK, et al., 2019) failed to calculate for the following reasons:   Risk score cannot be calculated because patient has a medical history suggesting prior/existing ASCVD

## 2024-03-26 NOTE — Progress Notes (Signed)
 Ph: [phone] Fax: [phone]   Patient ID: THAI HEMRICK, female    DOB: October 12, 1960, 63 y.o.   MRN: [id number]  This visit was conducted in person.  BP 118/62   Pulse 70   Temp 98.7 F (37.1 C) (Oral)   Ht 5' 2 (1.575 m)   Wt 249 lb 6 oz (113.1 kg)   SpO2 96%   BMI 45.61 kg/m    CC: DM f/u visit  Subjective:   HPI: MINTA FAIR is a 63 y.o. female presenting on 03/26/2024 for Medical Management of Chronic Issues (Pt here for f/u DM)   Known lumbar radiculopathy with LSS without neurogenic claudication followed PM&R Dr Marcelino s/p LEFT L3 nerve block 02/25/2024 with benefit. Has already received 2 steroid injections. Planned steroid injections Q3-4 months. She has also seen neurosurgery, s/p outpatient PT.   Diabetes diagnosis based on 1 hour OGTT of 236 (10/2023). She also had random sugar 212 on CMP from 09/24/2023.   DM - does not regularly check sugars. Compliant with antihyperglycemic regimen which includes: metformin  500mg  daily. Mounjaro  has still not been approved - continued PA process, will resubmit today with correct diagnoses. Denies low sugars or hypoglycemic symptoms. Denies paresthesias, blurry vision. Last diabetic eye exam 05/2023. Glucometer brand: unsure. Last foot exam: today. DSME: has not seen - requests - would like spanish speaker - to [place].  Lab Results  Component Value Date   HGBA1C 6.1 (A) 03/26/2024   Diabetic Foot Exam - Simple   Simple Foot Form Diabetic Foot exam was performed with the following findings: Yes 03/26/2024  9:20 AM  Visual Inspection No deformities, no ulcerations, no other skin breakdown bilaterally: Yes Sensation Testing Intact to touch and monofilament testing bilaterally: Yes Pulse Check Posterior Tibialis and Dorsalis pulse intact bilaterally: Yes Comments    No results found for: MICROALBUR, MALB24HUR   OSA on CPAP managed with Dr Maree.  She was previously walking 1-1.5 hours/day. Has stopped since  February due to low back pain.   Poor sleep due to pain.  Notes orthopnea - sleeps with 3 pillows.  Notes increase in mild headaches throughout the day over the past 2-3 months.   Last echocardiogram 03/2020 - EF 55-60%, no RWMA, normal LV systolic function, G1DD, trivial MR.  No chest pain. Notes ongoing dyspnea, worse with exertion.      Relevant past medical, surgical, family and social history reviewed and updated as indicated. Interim medical history since our last visit reviewed. Allergies and medications reviewed and updated. Outpatient Medications Prior to Visit  Medication Sig Dispense Refill   acetaminophen  (TYLENOL ) 500 MG tablet Take 500 mg by mouth every 6 (six) hours as needed.     albuterol  (VENTOLIN  HFA) 108 (90 Base) MCG/ACT inhaler INHALE 2 PUFFS INTO THE LUNGS EVERY 6 (SIX) HOURS AS NEEDED FOR WHEEZING OR SHORTNESS OF BREATH. PLEASE SCHEDULE OFFICE VISIT BEFORE ANY FUTURE REFILLS. 20.1 each 3   aspirin 81 MG tablet Take 81 mg by mouth daily.     b complex vitamins capsule Take 1 capsule by mouth once a week.     Biotin  5 MG CAPS Take 1 capsule (5 mg total) by mouth daily. (Patient taking differently: Take 1 capsule by mouth every other day.)     Cholecalciferol  1.25 MG (50000 UT) TABS Take 1 tablet by mouth once a week. 12 tablet 4   metFORMIN  (GLUCOPHAGE ) 500 MG tablet Take 1 tablet (500 mg total) by mouth daily with breakfast. 90  tablet 3   Multiple Vitamin (MULTIVITAMIN) tablet Take 1 tablet by mouth daily.     naproxen  (EC NAPROSYN ) 500 MG EC tablet Take 500 mg by mouth 2 (two) times daily with a meal.     pantoprazole  (PROTONIX ) 40 MG tablet Take 1 tablet (40 mg total) by mouth daily. 30 tablet 6   rosuvastatin  (CRESTOR ) 10 MG tablet Take 1 tablet (10 mg total) by mouth 2 (two) times a week. 24 tablet 3   No facility-administered medications prior to visit.     Per HPI unless specifically indicated in ROS section below Review of Systems  Objective:  BP 118/62    Pulse 70   Temp 98.7 F (37.1 C) (Oral)   Ht 5' 2 (1.575 m)   Wt 249 lb 6 oz (113.1 kg)   SpO2 96%   BMI 45.61 kg/m   Wt Readings from Last 3 Encounters:  03/26/24 249 lb 6 oz (113.1 kg)  03/25/24 249 lb (112.9 kg)  03/01/24 248 lb 8 oz (112.7 kg)      Physical Exam Vitals and nursing note reviewed.  Constitutional:      Appearance: Normal appearance. She is not ill-appearing.  HENT:     Head: Normocephalic and atraumatic.     Mouth/Throat:     Mouth: Mucous membranes are moist.     Pharynx: Oropharynx is clear. No oropharyngeal exudate or posterior oropharyngeal erythema.  Eyes:     Extraocular Movements: Extraocular movements intact.     Pupils: Pupils are equal, round, and reactive to light.     Comments: Mild haziness bilateral pupils  Cardiovascular:     Rate and Rhythm: Normal rate and regular rhythm.     Pulses: Normal pulses.     Heart sounds: Normal heart sounds. No murmur heard. Pulmonary:     Effort: Pulmonary effort is normal. No respiratory distress.     Breath sounds: Normal breath sounds. No wheezing, rhonchi or rales.  Musculoskeletal:     Cervical back: Normal range of motion and neck supple.     Right lower leg: No edema.     Left lower leg: No edema.  Skin:    General: Skin is warm and dry.     Findings: No rash.  Neurological:     Mental Status: She is alert.  Psychiatric:        Mood and Affect: Mood normal.        Behavior: Behavior normal.        Results for orders placed or performed in visit on 03/26/24  POCT glycosylated hemoglobin (Hb A1C)   Collection Time: 03/26/24  8:46 AM  Result Value Ref Range   Hemoglobin A1C 6.1 (A) 4.0 - 5.6 %   HbA1c POC (<> result, manual entry)     HbA1c, POC (prediabetic range)     HbA1c, POC (controlled diabetic range)     Lab Results  Component Value Date   CHOL 166 09/24/2023   HDL 38.50 (L) 09/24/2023   LDLCALC 48 09/24/2023   LDLDIRECT 47.0 06/28/2020   TRIG 398.0 (H) 09/24/2023   CHOLHDL  4 09/24/2023    Lab Results  Component Value Date   NA 141 09/24/2023   CL 104 09/24/2023   K 4.0 09/24/2023   CO2 27 09/24/2023   BUN 23 09/24/2023   CREATININE 0.92 09/24/2023   GFR 67.11 09/24/2023   CALCIUM  9.5 09/24/2023   ALBUMIN 4.0 10/08/2023   GLUCOSE 125 11/11/2023    Lab Results  Component Value Date   ALT 28 10/08/2023   AST 22 10/08/2023   ALKPHOS 75 10/08/2023   BILITOT 0.4 10/08/2023    Lab Results  Component Value Date   TSH 4.80 09/24/2023   Lab Results  Component Value Date   VITAMINB12 629 09/24/2023   Assessment & Plan:   Problem List Items Addressed This Visit     Obesity, morbid, BMI 40.0-49.9 (HCC)   H/o swedish lap band in [place] 2000s 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  New diabetes diagnosis as per below.  Pending PA - insurance approval. This was resubmitted today.  Obesity complicated by comorbidities of OSA, DM, OA, dyslipidemia  Baseline BMI 45.61 (03/2024)       Relevant Medications   tirzepatide  (MOUNJARO ) 2.5 MG/0.5ML Pen   tirzepatide  (MOUNJARO ) 5 MG/0.5ML Pen (Start on 04/23/2024)   Other Relevant Orders   Amb ref to Medical Nutrition Therapy-MNT   OSA (obstructive sleep apnea)   Followed by neurology Anastacio), last seen 2019. Unsure CPAP settings.  Notes worsening headaches. Will refer back to [facility] neurology.       Relevant Orders   Ambulatory referral to Neurology   Dyslipidemia   Chronic. Update levels on Crestor  MWF.  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      Relevant Orders   CBC with Differential/Platelet    Comprehensive metabolic panel with GFR   Magnesium   Uric acid   Lactate dehydrogenase   Type 2 diabetes mellitus with other specified complication (HCC) - Primary   Type 2 diabetes diagnosed based on 1 hour OGTT of 236 (10/2023) and random sugar 212 on CMP from 09/24/2023.  She is on metformin  500m daily.  Awaiting Mounjaro  eligibility determination by her insurance - PA re-submitted today.  Will also refer to diabetes education classes - preferably to spanish speaking provider RTC 3 mo DM f/u visit.       Relevant Medications   rosuvastatin  (CRESTOR ) 10 MG tablet   tirzepatide  (MOUNJARO ) 2.5 MG/0.5ML Pen   tirzepatide  (MOUNJARO ) 5 MG/0.5ML Pen (Start on 04/23/2024)   Other Relevant Orders   POCT glycosylated hemoglobin (Hb A1C) (Completed)   Amb ref to Medical Nutrition Therapy-MNT   Dyspnea   Latest echo 2021 reviewed.  Consider updated echocardiogram given endorse symptoms of increasing DOE, orthopnea.       Trigeminal neuralgia of left side of face   History of this - notes recurrent spasms developing - will refer back to neuro      History of Bell's palsy   Relevant Orders   Ambulatory referral to Neurology   Other Visit Diagnoses       Encounter for immunization       Relevant Orders   Flu vaccine trivalent PF, 6mos and older(Flulaval,Afluria,Fluarix,Fluzone) (Completed)        Meds ordered this encounter  Medications   rosuvastatin  (CRESTOR ) 10 MG tablet    Sig: Take 1 tablet (10 mg total) by mouth every Monday, Wednesday, and Friday.    Dispense:  36 tablet  Refill:  3    Note new dose   tirzepatide  (MOUNJARO ) 2.5 MG/0.5ML Pen    Sig: Inject 2.5 mg into the skin once a week.    Dispense:  2 mL    Refill:  0   tirzepatide  (MOUNJARO ) 5 MG/0.5ML Pen    Sig: Inject 5 mg into the skin once a week.    Dispense:  2 mL    Refill:  1    Orders Placed This Encounter  Procedures   Flu vaccine trivalent PF, 6mos and older(Flulaval,Afluria,Fluarix,Fluzone)    CBC with Differential/Platelet   Comprehensive metabolic panel with GFR   Magnesium   Uric acid   Lactate dehydrogenase   Lipid panel   TSH   Amb ref to Medical Nutrition Therapy-MNT    Referral Priority:   Routine    Referral Type:   Consultation    Referral Reason:   Specialty Services Required    Requested Specialty:   Nutrition    Number of Visits Requested:   1   Ambulatory referral to Neurology    Referral Priority:   Routine    Referral Type:   Consultation    Referral Reason:   Specialty Services Required    Requested Specialty:   Neurology    Number of Visits Requested:   1   POCT glycosylated hemoglobin (Hb A1C)    Patient Instructions  Vacuna contra flu hoy.  Laboratorios hoy.  Hemos vuelto a pedir Mounjaro .  La remitire de nuevo a Dr Maree para seguimiento de apnea de sueo.  La remitire a Scientist, research (life sciences).   Follow up plan: Return in about 3 months (around 06/25/2024), or if symptoms worsen or fail to improve, for follow up visit.  Anton Blas, MD

## 2024-03-26 NOTE — Assessment & Plan Note (Signed)
 Update labs today (CBC, CMP, Mg, LDH, urate).

## 2024-03-26 NOTE — Telephone Encounter (Signed)
 Great thank you!

## 2024-03-26 NOTE — Assessment & Plan Note (Signed)
 History of this - notes recurrent spasms developing - will refer back to neuro

## 2024-03-27 LAB — LACTATE DEHYDROGENASE: LDH: 167 U/L (ref 120–250)

## 2024-03-29 ENCOUNTER — Ambulatory Visit: Payer: Self-pay | Admitting: Family Medicine

## 2024-03-30 ENCOUNTER — Other Ambulatory Visit (HOSPITAL_COMMUNITY): Payer: Self-pay

## 2024-03-30 LAB — HM DIABETES EYE EXAM

## 2024-04-18 ENCOUNTER — Other Ambulatory Visit: Payer: Self-pay | Admitting: Family Medicine

## 2024-04-18 ENCOUNTER — Other Ambulatory Visit: Payer: Self-pay | Admitting: Physician Assistant

## 2024-04-18 DIAGNOSIS — G8929 Other chronic pain: Secondary | ICD-10-CM

## 2024-04-18 DIAGNOSIS — M5126 Other intervertebral disc displacement, lumbar region: Secondary | ICD-10-CM

## 2024-04-18 DIAGNOSIS — M4120 Other idiopathic scoliosis, site unspecified: Secondary | ICD-10-CM

## 2024-04-18 DIAGNOSIS — M4316 Spondylolisthesis, lumbar region: Secondary | ICD-10-CM

## 2024-04-18 DIAGNOSIS — M5416 Radiculopathy, lumbar region: Secondary | ICD-10-CM

## 2024-04-18 DIAGNOSIS — M65331 Trigger finger, right middle finger: Secondary | ICD-10-CM

## 2024-04-18 DIAGNOSIS — M47816 Spondylosis without myelopathy or radiculopathy, lumbar region: Secondary | ICD-10-CM

## 2024-04-19 NOTE — Telephone Encounter (Signed)
 Per 12/22/23 OV notes, Glade Boys, PA-C stopped both naproxen  (Naproxen ) and methocarbamol  (Robaxin ).   Lvm asking pt to call back. Need to know if pt has restarted these medications.

## 2024-04-21 ENCOUNTER — Other Ambulatory Visit: Payer: Self-pay | Admitting: Family Medicine

## 2024-04-21 NOTE — Telephone Encounter (Unsigned)
 Copied from CRM [id number]. Topic: Clinical - Medication Refill >> Apr 21, 2024  1:38 PM Mia F wrote: Medication: tirzepatide  (MOUNJARO ) 5 MG/0.5ML Pen (Starting on 04/23/2024)   Has the patient contacted their pharmacy? Yes (Agent: If no, request that the patient contact the pharmacy for the refill. If patient does not wish to contact the pharmacy document the reason why and proceed with request.) (Agent: If yes, when and what did the pharmacy advise?)  This is the patient's preferred pharmacy:  [id number] [place], [place] - [address] [address] Phone: [phone] Fax: [phone]  Is this the correct pharmacy for this prescription? Yes If no, delete pharmacy and type the correct one.   Has the prescription been filled recently? Yes  Is the patient out of the medication? Yes  Has the patient been seen for an appointment in the last year OR does the patient have an upcoming appointment? Yes  Can we respond through MyChart? Yes  Agent: Please be advised that Rx refills may take up to 3 business days. We ask that you follow-up with your pharmacy.

## 2024-04-22 MED ORDER — TIRZEPATIDE 5 MG/0.5ML ~~LOC~~ SOAJ: 5.0000 mg | SUBCUTANEOUS | 1 refills | Status: DC

## 2024-05-03 ENCOUNTER — Ambulatory Visit (HOSPITAL_BASED_OUTPATIENT_CLINIC_OR_DEPARTMENT_OTHER): Admitting: Student in an Organized Health Care Education/Training Program

## 2024-05-03 ENCOUNTER — Ambulatory Visit
Admission: RE | Admit: 2024-05-03 | Discharge: 2024-05-03 | Disposition: A | Discharge status: Home / Self Care | Source: Ambulatory Visit | Attending: Student in an Organized Health Care Education/Training Program | Admitting: Student in an Organized Health Care Education/Training Program

## 2024-05-03 DIAGNOSIS — G8929 Other chronic pain: Secondary | ICD-10-CM | POA: Insufficient documentation

## 2024-05-03 DIAGNOSIS — M5416 Radiculopathy, lumbar region: Secondary | ICD-10-CM | POA: Diagnosis not present

## 2024-05-03 MED ORDER — LIDOCAINE HCL 2 % IJ SOLN
20.0000 mL | Freq: Once | INTRAMUSCULAR | Status: AC
  Administered 2024-05-03: 400 mg
  Filled 2024-05-03: qty 20

## 2024-05-03 MED ORDER — DEXAMETHASONE SOD PHOSPHATE PF 10 MG/ML IJ SOLN
10.0000 mg | Freq: Once | INTRAMUSCULAR | Status: AC
  Administered 2024-05-03: 10 mg

## 2024-05-03 MED ORDER — ROPIVACAINE HCL 2 MG/ML IJ SOLN
1.0000 mL | Freq: Once | INTRAMUSCULAR | Status: AC
  Administered 2024-05-03: 20 mL via EPIDURAL
  Filled 2024-05-03: qty 20

## 2024-05-03 MED ORDER — SODIUM CHLORIDE 0.9% FLUSH
1.0000 mL | Freq: Once | INTRAVENOUS | Status: AC
  Administered 2024-05-03: 10 mL

## 2024-05-03 MED ORDER — IOHEXOL 180 MG/ML  SOLN
10.0000 mL | Freq: Once | INTRAMUSCULAR | Status: AC
  Administered 2024-05-03: 10 mL via EPIDURAL
  Filled 2024-05-03: qty 20

## 2024-05-03 MED ORDER — DIAZEPAM 5 MG PO TABS
5.0000 mg | ORAL_TABLET | ORAL | Status: AC
  Administered 2024-05-03: 5 mg via ORAL

## 2024-05-03 NOTE — Progress Notes (Signed)
 Safety precautions to be maintained throughout the outpatient stay will include: orient to surroundings, keep bed in low position, maintain call bell within reach at all times, provide assistance with transfer out of bed and ambulation.

## 2024-05-03 NOTE — Progress Notes (Signed)
 PROVIDER NOTE: Interpretation of information contained herein should be left to medically-trained personnel. Specific patient instructions are provided elsewhere under Patient Instructions section of medical record. This document was created in part using STT-dictation technology, any transcriptional errors that may result from this process are unintentional.  Patient: Stacey Nichols Type: Established DOB: 1961-06-02 MRN: [id number] PCP: Rilla Baller, MD  Service: Procedure DOS: 05/03/2024 Setting: Ambulatory Location: Ambulatory outpatient facility Delivery: Face-to-face Provider: Wallie Sherry, MD Specialty: Interventional Pain Management Specialty designation: 09 Location: Outpatient facility Ref. Prov.: Sherry Wallie, MD       Interventional Therapy   Procedure: Lumbar trans-foraminal epidural steroid injection (L-TFESI) #3  Laterality: Left (-LT)  Level: L3 nerve root(s) Imaging: Fluoroscopy-guided         Anesthesia: Local anesthesia (1-2% Lidocaine ) Sedation: Minimal Sedation                       DOS: 05/03/2024  Performed by: Wallie Sherry, MD  Purpose: Diagnostic/Therapeutic Indications: Lumbar radicular pain severe enough to impact quality of life or function. 1. Lumbar radiculopathy (LEFT L3)   2. Chronic radicular lumbar pain    NAS-11 Pain score:   Pre-procedure: 9 /10   Post-procedure: 5 /10     Position / Prep / Materials:  Position: Prone  Prep solution: ChloraPrep (2% chlorhexidine  gluconate and 70% isopropyl alcohol) Prep Area: Entire Posterior Lumbosacral Area.  From the lower tip of the scapula down to the tailbone and from flank to flank. Materials:  Tray: Block Needle(s):  Type: Spinal  Gauge (G): 22  Length: 5-in  Qty: 1     H&P (Pre-op Assessment):  Ms. Aerts is a 63 y.o. (year old), female patient, seen today for interventional treatment. 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 Ms. Lowrimore has a current medication list which includes the following prescription(s): acetaminophen , albuterol , aspirin, b complex vitamins, biotin , cholecalciferol , metformin , methocarbamol , multivitamin, naproxen , naproxen , pantoprazole , rosuvastatin , and tirzepatide . Her primarily concern today is the Back Pain (mid)  Initial Vital Signs:  Pulse/HCG Rate: 81ECG Heart Rate: 74 Temp: 98 F (36.7 C) Resp: (!) 9 BP: 124/85 SpO2: 99 %  BMI: Estimated body mass index is 44.26 kg/m as calculated from the following:   Height as of this encounter: 5' 2 (1.575 m).   Weight as of this encounter: 242 lb (109.8 kg).  Risk Assessment: Allergies: Reviewed. She is allergic to latex, sulfa antibiotics, and tape.  Allergy Precautions: None required Coagulopathies: Reviewed. None identified.  Blood-thinner therapy: None at this time Active Infection(s): Reviewed. None identified. Ms. Brophy is afebrile  Site Confirmation: Ms. Bray was asked to confirm the procedure and laterality before marking the site Procedure checklist: Completed Consent: Before the procedure and under the influence of no sedative(s), amnesic(s), or anxiolytics, the patient was informed of the treatment options, risks and possible complications. To fulfill our ethical and legal obligations, as recommended by the American Medical Association's Code of Ethics, I have informed the patient of my clinical impression; the nature and purpose of the treatment or procedure; the risks, benefits, and possible complications of the intervention; the alternatives, including doing  nothing; the risk(s) and benefit(s) of the alternative treatment(s) or procedure(s);  and the risk(s) and benefit(s) of doing nothing. The patient was provided information about the general risks and possible complications associated with the procedure. These may include, but are not limited to: failure to achieve desired goals, infection, bleeding, organ or nerve damage, allergic reactions, paralysis, and death. In addition, the patient was informed of those risks and complications associated to Spine-related procedures, such as failure to decrease pain; infection (i.e.: Meningitis, epidural or intraspinal abscess); bleeding (i.e.: epidural hematoma, subarachnoid hemorrhage, or any other type of intraspinal or peri-dural bleeding); organ or nerve damage (i.e.: Any type of peripheral nerve, nerve root, or spinal cord injury) with subsequent damage to sensory, motor, and/or autonomic systems, resulting in permanent pain, numbness, and/or weakness of one or several areas of the body; allergic reactions; (i.e.: anaphylactic reaction); and/or death. Furthermore, the patient was informed of those risks and complications associated with the medications. These include, but are not limited to: allergic reactions (i.e.: anaphylactic or anaphylactoid reaction(s)); adrenal axis suppression; blood sugar elevation that in diabetics may result in ketoacidosis or comma; water retention that in patients with history of congestive heart failure may result in shortness of breath, pulmonary edema, and decompensation with resultant heart failure; weight gain; swelling or edema; medication-induced neural toxicity; particulate matter embolism and blood vessel occlusion with resultant organ, and/or nervous system infarction; and/or aseptic necrosis of one or more joints. Finally, the patient was informed that Medicine is not an exact science; therefore, there is also the possibility of unforeseen or unpredictable risks and/or possible  complications that may result in a catastrophic outcome. The patient indicated having understood very clearly. We have given the patient no guarantees and we have made no promises. Enough time was given to the patient to ask questions, all of which were answered to the patient's satisfaction. Ms. Jurgens has indicated that she wanted to continue with the procedure. Attestation: I, the ordering provider, attest that I have discussed with the patient the benefits, risks, side-effects, alternatives, likelihood of achieving goals, and potential problems during recovery for the procedure that I have provided informed consent. Date  Time: 05/03/2024  9:26 AM  Pre-Procedure Preparation:  Monitoring: As per clinic protocol. Respiration, ETCO2, SpO2, BP, heart rate and rhythm monitor placed and checked for adequate function Safety Precautions: Patient was assessed for positional comfort and pressure points before starting the procedure. Time-out: I initiated and conducted the Time-out before starting the procedure, as per protocol. The patient was asked to participate by confirming the accuracy of the Time Out information. Verification of the correct person, site, and procedure were performed and confirmed by me, the nursing staff, and the patient. Time-out conducted as per Joint Commission's Universal Protocol (UP.01.01.01). Time: 1018 Start Time: 1019 hrs.  Description/Narrative of Procedure:          Target: The 6 o'clock position under the pedicle, on the affected side. Region: Posterolateral Lumbosacral Approach: Posterior Percutaneous Paravertebral approach.  Rationale (medical necessity): procedure needed and proper for the diagnosis and/or treatment of the patient's medical symptoms and needs. Procedural Technique Safety Precautions: Aspiration looking for blood return was conducted prior to all injections. At no point did we inject any substances, as a needle was being advanced. No attempts  were made at seeking any paresthesias. Safe injection practices and needle disposal techniques used. Medications properly checked for expiration dates. SDV (single dose vial) medications used. Description of the Procedure: Protocol guidelines were followed. The patient was placed in position over the procedure table. The target area was  identified and the area prepped in the usual manner. Skin & deeper tissues infiltrated with local anesthetic. Appropriate amount of time allowed to pass for local anesthetics to take effect. The procedure needles were then advanced to the target area. Proper needle placement secured. Negative aspiration confirmed. Solution injected in intermittent fashion, asking for systemic symptoms every 0.5cc of injectate. The needles were then removed and the area cleansed, making sure to leave some of the prepping solution back to take advantage of its long term bactericidal properties.  4 cc solution made of 1 cc of preservative-free saline, 2 cc of 0.2% ropivacaine , 1 cc of Decadron  10 mg/cc.   Vitals:   05/03/24 0942 05/03/24 1020 05/03/24 1023  BP: 124/85 (!) 127/105 (!) 129/92  Pulse: 81    Resp: (!) 9 19 16   Temp: 98 F (36.7 C)    SpO2: 99% 96% 96%  Weight: 242 lb (109.8 kg)    Height: 5' 2 (1.575 m)       Start Time: 1019 hrs. End Time: 1023 hrs.  Imaging Guidance (Spinal):          Type of Imaging Technique: Fluoroscopy Guidance (Spinal) Indication(s): Fluoroscopy guidance for needle placement to enhance accuracy in procedures requiring precise needle localization for targeted delivery of medication in or near specific anatomical locations not easily accessible without such real-time imaging assistance. Exposure Time: Please see nurses notes. Contrast: Before injecting any contrast, we confirmed that the patient did not have an allergy to iodine, shellfish, or radiological contrast. Once satisfactory needle placement was completed at the desired level,  radiological contrast was injected. Contrast injected under live fluoroscopy. No contrast complications. See chart for type and volume of contrast used. Fluoroscopic Guidance: I was personally present during the use of fluoroscopy. Tunnel Vision Technique used to obtain the best possible view of the target area. Parallax error corrected before commencing the procedure. Direction-depth-direction technique used to introduce the needle under continuous pulsed fluoroscopy. Once target was reached, antero-posterior, oblique, and lateral fluoroscopic projection used confirm needle placement in all planes. Images permanently stored in EMR. Interpretation: I personally interpreted the imaging intraoperatively. Adequate needle placement confirmed in multiple planes. Appropriate spread of contrast into desired area was observed. No evidence of afferent or efferent intravascular uptake. No intrathecal or subarachnoid spread observed. Permanent images saved into the patient's record.  Post-operative Assessment:  Post-procedure Vital Signs:  Pulse/HCG Rate: 8176 Temp: 98 F (36.7 C) Resp: 16 BP: (!) 129/92 SpO2: 96 %  EBL: None  Complications: No immediate post-treatment complications observed by team, or reported by patient.  Note: The patient tolerated the entire procedure well. A repeat set of vitals were taken after the procedure and the patient was kept under observation following institutional policy, for this type of procedure. Post-procedural neurological assessment was performed, showing return to baseline, prior to discharge. The patient was provided with post-procedure discharge instructions, including a section on how to identify potential problems. Should any problems arise concerning this procedure, the patient was given instructions to immediately contact us , at any time, without hesitation. In any case, we plan to contact the patient by telephone for a follow-up status report regarding this  interventional procedure.  Comments:  No additional relevant information.  Plan of Care (POC)  Orders:  Orders Placed This Encounter  Procedures   DG PAIN CLINIC C-ARM 1-60 MIN NO REPORT    Intraoperative interpretation by procedural physician at [facility].    Standing Status:   Standing  Number of Occurrences:   1    Reason for exam::   Assistance in needle guidance and placement for procedures requiring needle placement in or near specific anatomical locations not easily accessible without such assistance.     Medications ordered for procedure: Meds ordered this encounter  Medications   iohexol  (OMNIPAQUE ) 180 MG/ML injection 10 mL    Must be Myelogram-compatible. If not available, you may substitute with a water-soluble, non-ionic, hypoallergenic, myelogram-compatible radiological contrast medium.   lidocaine  (XYLOCAINE ) 2 % (with pres) injection 400 mg   diazepam  (VALIUM ) tablet 5 mg    Make sure Flumazenil is available in the pyxis when using this medication. If oversedation occurs, administer 0.2 mg IV over 15 sec. If after 45 sec no response, administer 0.2 mg again over 1 min; may repeat at 1 min intervals; not to exceed 4 doses (1 mg)   dexamethasone  (DECADRON ) injection 10 mg   ropivacaine  (PF) 2 mg/mL (0.2%) (NAROPIN ) injection 1 mL   sodium chloride  flush (NS) 0.9 % injection 1 mL   Medications administered: We administered iohexol , lidocaine , diazepam , dexamethasone , ropivacaine  (PF) 2 mg/mL (0.2%), and sodium chloride  flush.  See the medical record for exact dosing, route, and time of administration.    Left L3 TF ESI 01/21/24, 02/25/24, 05/03/24    Follow-up plan:   Return in about 5 weeks (around 06/07/2024) for PPE, F2F, Seema.     Recent Visits Date Type Provider Dept  03/25/24 Office Visit Marcelino Nurse, MD [facility]  02/25/24 Procedure visit Marcelino Nurse, MD [facility]  02/19/24 Office Visit Marcelino Nurse, MD [facility]  Showing recent visits within past 90 days and meeting all other requirements Today's Visits Date Type Provider Dept  05/03/24 Procedure visit Marcelino Nurse, MD [facility]  Showing today's visits and meeting all other requirements Future Appointments Date Type Provider Dept  06/28/24 Appointment Patel, Seema K, NP [facility]  Showing future appointments within next 90 days and meeting all other requirements   Disposition: Discharge home  Discharge (Date  Time): 05/03/2024; 1030 hrs.   Primary Care Physician: Rilla Baller, MD Location: [facility] Note by: Nurse Marcelino, MD (TTS technology used. I apologize for any typographical errors that were not detected and corrected.) Date: 05/03/2024; Time: 10:32 AM  Disclaimer:  Medicine is not an Visual merchandiser. The only guarantee in medicine is that nothing is guaranteed. It is important to note that the decision to proceed with this intervention was based on the information collected from the patient. The Data and conclusions were drawn from the patient's questionnaire, the interview, and the physical examination. Because the information was provided in large part by the patient, it cannot be guaranteed that it has not been purposely or unconsciously manipulated. Every effort has been made to obtain as much relevant data as possible for this evaluation. It is important to note that the conclusions that lead to this procedure are derived in large part from the available data. Always take into account that the treatment will also be dependent on availability of resources and existing treatment guidelines, considered by other Pain Management Practitioners as being common knowledge and practice, at the time of the intervention. For Medico-Legal purposes, it is also important to point out that variation in procedural techniques and pharmacological choices are the acceptable norm. The  indications, contraindications, technique, and results of the above procedure should only be interpreted and judged by a Board-Certified Interventional Pain Specialist  with extensive familiarity and expertise in the same exact procedure and technique.

## 2024-05-03 NOTE — Patient Instructions (Signed)

## 2024-05-04 ENCOUNTER — Telehealth: Payer: Self-pay | Admitting: *Deleted

## 2024-05-04 NOTE — Telephone Encounter (Signed)
 Called for pst procedure follow-up. Message left.

## 2024-05-06 NOTE — Progress Notes (Signed)
 Referring Physician:  Rilla Baller, MD [address] [place],  [place] [address]  Primary Physician:  Rilla Baller, MD  Interpreter used as patient speaks spanish.   History of Present Illness: [name] has a history of OSA, NAFLD, GERD, osteopenia, history of gastric banding surgery and gastric sleeve, dyslipidemia, AML in remission, depression, prediabetes, history of stroke, ?FM.    Interpreter used as patient speaks spanish.   Last seen by me on 03/01/24 for constant LBP. Was improved at last visit but still present. Leg pain improved with injections and PT.   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 She has far lateral disc on left at L3-L4 that affects left L3 nerve along with moderate central stenosis L3-L5, and right foraminal disc L5-S1 with moderate right foraminal stenosis.   She was to finish out PT and follow up with Dr. Marcelino to discuss further injections. Weight loss was discussed.   She had repeat left L3 TF ESI with Dr. Marcelino on 05/03/24.   She is here for follow up.   She continues with intermittent LBP that is worse with turning in bed, bending, walking. Thinks the injections have helped with her LBP.   Her primary complaint is constant left hip pain that in in her buttock and lateral hip that is worse with any walking. This has been worst in last few days. She's had this pain for months, but it was intermittent and now is constant.   She was discharged from PT- she does not think it helped.   She is taking tylenol  or naproxen .   She does not smoke.   Bowel/Bladder Dysfunction: none  Conservative measures:  Physical therapy: initial eval at [facility] on 12/30/23 with 14 visits through 03/01/24 Multimodal medical therapy including regular antiinflammatories: naproxen , Robaxin , Tylenol , flexeril  Injections:  Left L3 TF ESI 05/03/24 Left L3 TF ESI 02/25/24 Left L3 ESI on 01/21/24  Past  Surgery: none  Stacey Nichols has no symptoms of cervical myelopathy.   The symptoms are causing a significant impact on the patient's life.   Review of Systems:  A 10 point review of systems is negative, except for the pertinent positives and negatives detailed in the HPI.  Past Medical History: Past Medical History:  Diagnosis Date   AML (acute myeloblastic leukemia) (HCC) 2007   s/p chemo (Dr Alto)   Anemia    hx of   Anxiety    Arthritis    severe   Constipation    COVID-19 virus infection 07/04/2021   Depression    Diabetes mellitus without complication (HCC)    Fatty liver    GERD (gastroesophageal reflux disease)    H/O Bell's palsy    H/O hiatal hernia    Headache    occasional   High triglycerides    History of cancer chemotherapy    Leukemia (HCC)    Morbidly obese (HCC)    Osteopenia 06/08/2017   DEXA 2017 - T -1.8 spine   Pneumonia    hx of    Rheumatoid arthritis (HCC)    Sleep apnea    Stroke (HCC)    mini stroke-caused bells palsy for 1 month    Past Surgical History: Past Surgical History:  Procedure Laterality Date   ABDOMINAL HYSTERECTOMY  2001   heavy bleeding, fibroids, 1 ovary remains   BLEPHAROPLASTY Bilateral 06/2020   in [place]   COLONOSCOPY WITH PROPOFOL  N/A 09/19/2020  TA, int hem, rpt 7 yrs Renne, Darren, MD)   FACIAL COSMETIC SURGERY  06/2020   in [place]   LAPAROSCOPIC GASTRIC BAND REMOVAL WITH LAPAROSCOPIC GASTRIC SLEEVE RESECTION  04/26/2014   LAPAROSCOPIC GASTRIC BANDING  2000   performed in [place]   REPLACEMENT TOTAL KNEE Left 10/2017   Dr [facility]   REPLACEMENT TOTAL KNEE Right 06/2018   Dr Darliss [facility]   TUBAL LIGATION  1996   UPPER GI ENDOSCOPY N/A 04/26/2014   Procedure: UPPER GI ENDOSCOPY;  Surgeon: Donnice KATHEE Lunger, MD;  Location: [facility];  Service: General;  Laterality: N/A;    Allergies: Allergies as of 05/10/2024 - Review Complete 05/03/2024  Allergen Reaction Noted   Latex Other (See  Comments) and Itching 01/10/2014   Sulfa antibiotics Rash 09/28/2006   Tape Rash 06/20/2015    Medications: Outpatient Encounter Medications as of 05/10/2024  Medication Sig   acetaminophen  (TYLENOL ) 500 MG tablet Take 500 mg by mouth every 6 (six) hours as needed.   albuterol  (VENTOLIN  HFA) 108 (90 Base) MCG/ACT inhaler INHALE 2 PUFFS INTO THE LUNGS EVERY 6 (SIX) HOURS AS NEEDED FOR WHEEZING OR SHORTNESS OF BREATH. PLEASE SCHEDULE OFFICE VISIT BEFORE ANY FUTURE REFILLS.   aspirin 81 MG tablet Take 81 mg by mouth daily.   b complex vitamins capsule Take 1 capsule by mouth once a week.   Biotin  5 MG CAPS Take 1 capsule (5 mg total) by mouth daily. (Patient taking differently: Take 1 capsule by mouth every other day.)   Cholecalciferol  1.25 MG (50000 UT) TABS Take 1 tablet by mouth once a week.   methocarbamol  (ROBAXIN ) 500 MG tablet TAKE 1-2 TABLETS (500-1,000 MG TOTAL) BY MOUTH 2 (TWO) TIMES DAILY AS NEEDED FOR MUSCLE SPASMS.   Multiple Vitamin (MULTIVITAMIN) tablet Take 1 tablet by mouth daily.   naproxen  (EC NAPROSYN ) 500 MG EC tablet Take 500 mg by mouth 2 (two) times daily with a meal.   naproxen  (NAPROSYN ) 500 MG tablet TAKE 1 TABLET (500 MG TOTAL) BY MOUTH DAILY AS NEEDED FOR MODERATE PAIN.   pantoprazole  (PROTONIX ) 40 MG tablet Take 1 tablet (40 mg total) by mouth daily.   rosuvastatin  (CRESTOR ) 10 MG tablet Take 1 tablet (10 mg total) by mouth every Monday, Wednesday, and Friday.   tirzepatide  (MOUNJARO ) 5 MG/0.5ML Pen Inject 5 mg into the skin once a week.   [DISCONTINUED] metFORMIN  (GLUCOPHAGE ) 500 MG tablet Take 1 tablet (500 mg total) by mouth daily with breakfast.   No facility-administered encounter medications on file as of 05/10/2024.    Social History: Social History   Tobacco Use   Smoking status: Former    Current packs/day: 0.00    Types: Cigarettes    Start date: 03/30/1988    Quit date: 03/30/2018    Years since quitting: 6.1   Smokeless tobacco: Never  Vaping  Use   Vaping status: Never Used  Substance Use Topics   Alcohol use: Yes    Comment: occasional   Drug use: No    Family Medical History: Family History  Problem Relation Age of Onset   Cancer Mother        cervix   Hyperlipidemia Father    Obesity Father    CAD Father        MI and arrhythmia   Heart Problems Father    Cancer Sister        metastatic, ?pancrease primary   Diabetes Maternal Grandfather    Breast cancer Neg Hx  Physical Examination: Vitals:   05/10/24 0832  BP: 122/86   Awake, alert, oriented to person, place, and time.  Speech is clear and fluent. Fund of knowledge is appropriate.   Cranial Nerves: Pupils equal round and reactive to light.  Facial tone is symmetric.    No abnormal lesions on exposed skin.   Strength: Side Iliopsoas Quads Hamstring PF DF EHL  R 5 5 5 5 5 5   L 5 5 5 5 5 5    Reflexes are 2+ and symmetric at the patella and achilles.    Clonus is not present.   Bilateral lower extremity sensation is intact to light touch.     She has no pain with IR/ER of both hips. No tenderness over GTB bilaterally.   She ambulates with a cane.   Medical Decision Making  Imaging: none  Assessment and Plan: [name] has seen improvement with PT and injections (she thinks injections helped more).  She still has intermittent LBP but it is much better.   Primary compliant today is constant left hip pain that is in her left buttock and lateral hip. She's had this pain for months, but it was intermittent and now is constant.   No pain with IR/ER of both hips. No tenderness over GTB. This left sided pain does not appear to be hip mediated.   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 She has far lateral disc on left at L3-L4 that affects left L3 nerve along with moderate central stenosis L3-L5, and right foraminal disc L5-S1 with moderate right foraminal stenosis.   Treatment options discussed with  patient and following plan made:   - She leaves to go to [place] tomorrow for the month of November.  - She will likely still see improvement with ESI from 05/03/24.  - She should continue with HEP from PT.  - Will send message to Nathanel- may need to change follow up in December to see Dr. Marcelino to discuss further injections.  - She improved with trigger finger injection, but pain is back. Will schedule her to see Dr. Onesimo when she gets back from [place].  - Weight loss discussed and encouraged prior to any consideration of surgery. She would like to avoid surgery if possible. Would need full length scoliosis xrays prior to this as well.  - Follow up with me at end of January and prn.   I spent a total of 20 minutes in face-to-face and non-face-to-face activities related to this patient's care today including review of outside records, review of imaging, review of symptoms, physical exam, discussion of differential diagnosis, discussion of treatment options, and documentation.   Glade Boys PA-C Dept. of Neurosurgery

## 2024-05-10 ENCOUNTER — Ambulatory Visit: Admitting: Orthopedic Surgery

## 2024-05-10 ENCOUNTER — Encounter: Payer: Self-pay | Admitting: Orthopedic Surgery

## 2024-05-10 VITALS — BP 122/86 | Ht 62.0 in | Wt 243.2 lb

## 2024-05-10 DIAGNOSIS — M25552 Pain in left hip: Secondary | ICD-10-CM | POA: Diagnosis not present

## 2024-05-10 DIAGNOSIS — M5386 Other specified dorsopathies, lumbar region: Secondary | ICD-10-CM | POA: Diagnosis not present

## 2024-05-10 DIAGNOSIS — M4120 Other idiopathic scoliosis, site unspecified: Secondary | ICD-10-CM

## 2024-05-10 DIAGNOSIS — M4316 Spondylolisthesis, lumbar region: Secondary | ICD-10-CM

## 2024-05-10 DIAGNOSIS — M47816 Spondylosis without myelopathy or radiculopathy, lumbar region: Secondary | ICD-10-CM

## 2024-05-10 DIAGNOSIS — M5126 Other intervertebral disc displacement, lumbar region: Secondary | ICD-10-CM | POA: Diagnosis not present

## 2024-05-10 DIAGNOSIS — M5416 Radiculopathy, lumbar region: Secondary | ICD-10-CM

## 2024-05-17 ENCOUNTER — Encounter: Payer: Self-pay | Admitting: Radiology

## 2024-05-21 ENCOUNTER — Telehealth: Payer: Self-pay

## 2024-05-21 DIAGNOSIS — Z7189 Other specified counseling: Secondary | ICD-10-CM

## 2024-05-21 NOTE — Progress Notes (Signed)
 Complex Care Management Note  Care Guide Note 05/21/2024 Name: Stacey Nichols MRN: [id number] DOB: [birth year]  Stacey Nichols is a 63 y.o. year old female who sees Rilla Baller, MD for primary care. I reached out to Leita FORBES Rank by phone today to offer complex care management services.  Ms. Fitzsimmons was given information about Complex Care Management services today including:   The Complex Care Management services include support from the care team which includes your Nurse Care Manager, Clinical Social Worker, or Pharmacist.  The Complex Care Management team is here to help remove barriers to the health concerns and goals most important to you. Complex Care Management services are voluntary, and the patient may decline or stop services at any time by request to their care team member.   Complex Care Management Consent Status: Patient did not agree to participate in complex care management services at this time.  Follow up plan:    Encounter Outcome:  Patient Refused  Jeoffrey Buffalo , RMA     [facility]  [facility] Guide  Direct Dial: [phone]  Website: [email or web address]

## 2024-06-24 ENCOUNTER — Ambulatory Visit: Admitting: Orthopedic Surgery

## 2024-06-24 ENCOUNTER — Encounter: Attending: Family Medicine | Admitting: Dietician

## 2024-06-24 ENCOUNTER — Encounter: Payer: Self-pay | Admitting: Dietician

## 2024-06-24 ENCOUNTER — Encounter: Payer: Self-pay | Admitting: Orthopedic Surgery

## 2024-06-24 VITALS — Ht 62.0 in | Wt 236.8 lb

## 2024-06-24 DIAGNOSIS — M65331 Trigger finger, right middle finger: Secondary | ICD-10-CM

## 2024-06-24 DIAGNOSIS — Z713 Dietary counseling and surveillance: Secondary | ICD-10-CM | POA: Insufficient documentation

## 2024-06-24 DIAGNOSIS — E1169 Type 2 diabetes mellitus with other specified complication: Secondary | ICD-10-CM | POA: Insufficient documentation

## 2024-06-24 NOTE — Patient Instructions (Addendum)
 Increase water from 4 glasses a day to 6 glasses.  Have 3-4 oz of protein at each meal, this is about the size of your palm.  Have your coffee with either Splenda or Stevia  Try incorporate seated exercises 2-3 more times each week, as tolerated with back pain.  Try using Miralax  daily to prevent constipation.  Weigh yourself only once a week on the same day and look for weight loss of 1-2 lbs per week

## 2024-06-24 NOTE — Progress Notes (Signed)
 Diabetes Self-Management Education  Visit Type: First/Initial  Appt. Start Time: 1405 Appt. End Time: 1500  06/24/2024  [name], identified by name and date of birth, is a 63 y.o. female with a diagnosis of Diabetes: (Patient-Rptd) (P) Type 2.   ASSESSMENT Height 5' 2 (1.575 m), weight 236 lb 12.8 oz (107.4 kg). Body mass index is 43.31 kg/m.  Spanish interpreter, Oneil Sitter, present for appointment Pt reports desire to lose weight and manage DM. Pt reports taking Mounjaro  @5  mg, reports moderate decrease in appetite, loss of 17 lbs in 3 months. Pt reports frequent constipation since starting Mounjaro , takes milk of magnesia daily with minor relief. Pt reports history of weight cycling, history of gastric band surgery in 2015, only lost 25 lbs after procedure and gained it all back. Pt reports making dietary changes to assist in weight loss including low carbs, lean portien, eating salads and vegetables regularly. Pt reports poor sleep, stays up all night 2-3 times a week, states they had a friend that passed away in their sleep and has a fear of sleep aides due to this. Pt reports they stopped their regular exercise routine (walking 45-60 minutes, 5x a week) in May do to back pain, currently getting injections for pain and has a consult for further treatment next week. Pt reports doing seated exercises 3x a week for 10 minutes now but has to stop at 10 minutes due to back pain.   Diabetes Self-Management Education - 06/23/24 1152       Visit Information   Visit Type First/Initial      Psychosocial Assessment   Patient Belief/Attitude about Diabetes Motivated to manage diabetes    What is the hardest part about your diabetes right now, causing you the most concern, or is the most worrisome to you about your diabetes?   Making healty food and beverage choices;Getting support / problem solving    Self-care barriers None    Self-management support Doctor's office    Other  persons present Patient;Interpreter    Patient Concerns Nutrition/Meal planning    Special Needs None    Preferred Learning Style Visual;Hands on    Learning Readiness Ready    How often do you need to have someone help you when you read instructions, pamphlets, or other written materials from your doctor or pharmacy? 5 - Always   Spanish     Pre-Education Assessment   Patient understands the diabetes disease and treatment process. Needs Instruction    Patient understands incorporating nutritional management into lifestyle. Needs Instruction    Patient undertands incorporating physical activity into lifestyle. Needs Instruction    Patient understands using medications safely. Needs Instruction    Patient understands monitoring blood glucose, interpreting and using results Needs Instruction    Patient understands prevention, detection, and treatment of acute complications. Needs Instruction    Patient understands prevention, detection, and treatment of chronic complications. Needs Instruction    Patient understands how to develop strategies to address psychosocial issues. Needs Instruction    Patient understands how to develop strategies to promote health/change behavior. Needs Instruction      Complications   Last HgB A1C per patient/outside source 6.1 %   03/15/2024   How often do you check your blood sugar? 3-4 times / week    Fasting Blood glucose range (mg/dL) [phone]      Dietary Intake   Breakfast 2 eggs, salsa, 2 keto bread slices, coffee w/ almond milk    Lunch Salad w/  garbanzo beans, cactus, beets, olive oil/lemon dressing    Dinner Chicken, sweet potato, salad      Activity / Exercise   Activity / Exercise Type ADL's;Light (walking / raking leaves)   Seated exercises   How many days per week do you exercise? 3    How many minutes per day do you exercise? 10    Total minutes per week of exercise 30      Patient Education   Disease Pathophysiology Explored patient's options  for treatment of their diabetes;Factors that contribute to the development of diabetes    Healthy Eating Role of diet in the treatment of diabetes and the relationship between the three main macronutrients and blood glucose level;Food label reading, portion sizes and measuring food.;Meal options for control of blood glucose level and chronic complications.    Being Active Helped patient identify appropriate exercises in relation to his/her diabetes, diabetes complications and other health issue.    Medications Reviewed patients medication for diabetes, action, purpose, timing of dose and side effects.;Taught/reviewed insulin /injectables, injection, site rotation, insulin /injectables storage and needle disposal.    Monitoring Identified appropriate SMBG and/or A1C goals.    Chronic complications Relationship between chronic complications and blood glucose control    Diabetes Stress and Support Identified and addressed patients feelings and concerns about diabetes;Brainstormed with patient on coping mechanisms for social situations, getting support from significant others, dealing with feelings about diabetes    Lifestyle and Health Coping Lifestyle issues that need to be addressed for better diabetes care   Sleep     Individualized Goals (developed by patient)   Nutrition Follow meal plan discussed    Physical Activity 15 minutes per day   As tolerated   Medications take my medication as prescribed    Monitoring  Test my blood glucose as discussed    Problem Solving Sleep Pattern    Reducing Risk examine blood glucose patterns      Post-Education Assessment   Patient understands the diabetes disease and treatment process. Comprehends key points    Patient understands incorporating nutritional management into lifestyle. Comprehends key points    Patient undertands incorporating physical activity into lifestyle. Comprehends key points    Patient understands using medications safely. Comphrehends  key points    Patient understands monitoring blood glucose, interpreting and using results Needs Review    Patient understands prevention, detection, and treatment of acute complications. Needs Review    Patient understands prevention, detection, and treatment of chronic complications. Comprehends key points    Patient understands how to develop strategies to address psychosocial issues. Comprehends key points    Patient understands how to develop strategies to promote health/change behavior. Needs Review      Outcomes   Expected Outcomes Demonstrated interest in learning. Expect positive outcomes    Future DMSE 2 months    Program Status Not Completed          Individualized Plan for Diabetes Self-Management Training:   Learning Objective:  Patient will have a greater understanding of diabetes self-management. Patient education plan is to attend individual and/or group sessions per assessed needs and concerns.   Plan:   Patient Instructions  Increase water from 4 glasses a day to 6 glasses.  Have 3-4 oz of protein at each meal, this is about the size of your palm.  Have your coffee with either Splenda or Stevia  Try incorporate seated exercises 2-3 more times each week, as tolerated with back pain.  Try using Miralax  daily  to prevent constipation.  Weigh yourself only once a week on the same day and look for weight loss of 1-2 lbs per week  Expected Outcomes:  Demonstrated interest in learning. Expect positive outcomes  Education material provided: Carbohydrate counting sheet  If problems or questions, patient to contact team via:  Phone and Email  Future DSME appointment: 2 months

## 2024-06-24 NOTE — Progress Notes (Signed)
 Return patient Visit  Assessment: Stacey Nichols is a 63 y.o. female with the following: Right long finger trigger finger  Plan: [name] has recurrence of the right long finger trigger finger.  She had excellent response to prior injection, and is not interested in proceeding with another injection today.  This was completed without issue.  She states that if the symptoms return, she is likely to consider surgery.  Procedure was discussed.  All questions have been answered.  She will follow-up as needed.   Procedure note injection - Right Long finger A1 Pulley  Verbal consent was obtained to inject the Right Long finger A1 pulley Timeout was completed to confirm the site of injection.  The skin was prepped with alcohol and ethyl chloride was sprayed at the injection site.  A 21-gauge needle was used to inject 40 mg of Depo-Medrol  and 1% lidocaine  (1 cc) into the Right Long finger using a direct anterior approach.  There were no complications. Patient tolerated the procedure well. A sterile bandage was applied     Follow-up: Return if symptoms worsen or fail to improve.  Subjective:  Chief Complaint  Patient presents with   Right long finger pain    Prior trigger finger injection was very helpful until the past 2 months    History of Present Illness: Stacey Nichols is a 63 y.o. female who presents for evaluation of Right Long finger pain.  I saw her in clinic approximately 6 months ago.  At that time, we proceeded with a trigger finger injection to the right long finger.  She has done very well.  She notes improved symptoms for about 4 months.  Over the past couple months, she has had progressively worsening pain.  She notes some catching sensations.  Occasionally, she will have to use the left hand to straighten her long finger.  Symptoms are usually worse in the morning.   Review of Systems: No fevers or chills No numbness or tingling No chest pain No shortness of  breath No bowel or bladder dysfunction No GI distress No headaches    Objective: There were no vitals taken for this visit.  Physical Exam:  General: Alert and oriented. and No acute distress. Gait: Normal gait.  Right hand with mild swelling about the A1 pulley to the long finger.  Tenderness to palpation over the A1 pulley.  No active triggering is noted in clinic today.  Fingers warm well-perfused.  Sensation intact throughout the right hand.  2+ radial pulse.   IMAGING: No new imaging obtained today   New Medications:  No orders of the defined types were placed in this encounter.     Oneil DELENA Horde, MD  06/24/2024 9:50 AM

## 2024-06-25 ENCOUNTER — Ambulatory Visit: Admitting: Family Medicine

## 2024-06-25 ENCOUNTER — Encounter: Payer: Self-pay | Admitting: Family Medicine

## 2024-06-25 NOTE — Progress Notes (Unsigned)
 Ph: [phone] Fax: [phone]   Patient ID: Stacey Nichols, female    DOB: [birth year], 62 y.o.   MRN: [id number]  This visit was conducted in person.  There were no vitals taken for this visit.   CC: 3 mo DM f/u visit - pt late cancelled appt  Subjective:   HPI: Stacey Nichols is a 63 y.o. female presenting on 06/25/2024 for No chief complaint on file.   Recent trip to [place] for the month of November.   Chronic low back pain - known right lateral listhesis L3/4 as well as far lateral disc herniation left L3/4 affecting left L3 nerve root and moderate central canal stenosis L3-L5. S/p 3rd L L3 TFESI 04/2024 (Lateef). Physical therapy didn't help. She manages pain with tylenol  or naproxen .  Injections: Left L3 TF ESI 05/03/24 Left L3 TF ESI 02/25/24 Left L3 ESI on 01/21/24  OSA on CPAP managed by Dr Maree at [facility].   H/o swedish lap band in [place] 2000s 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  Baseline BMI 45.61 (03/2024)   Diabetes diagnosis based on 1 hour OGTT of 236 (10/2023). She also had random sugar 212 on CMP from 09/24/2023.  DM - does *** regularly check sugars ***. Compliant with antihyperglycemic regimen which includes: metformin  500mg  daily and mounjaro  5mg  weekly. Denies low sugars or hypoglycemic symptoms. Denies paresthesias, blurry vision. Last diabetic eye exam ***. Glucometer brand: ***. Last foot exam: ***. DSME: at [facility] - saw nutritionist yesterday.  Lab Results  Component Value Date   HGBA1C 6.1 (A) 03/26/2024   Diabetic Foot Exam - Simple   No data filed    No results found for: MACKEY CURRENT       Relevant past medical, surgical, family and social history reviewed and updated as indicated. Interim medical history since our last visit reviewed. Allergies and medications reviewed and updated. Outpatient Medications Prior to Visit  Medication Sig Dispense Refill   acetaminophen  (TYLENOL ) 500 MG tablet Take 500 mg by mouth every 6  (six) hours as needed.     albuterol  (VENTOLIN  HFA) 108 (90 Base) MCG/ACT inhaler INHALE 2 PUFFS INTO THE LUNGS EVERY 6 (SIX) HOURS AS NEEDED FOR WHEEZING OR SHORTNESS OF BREATH. PLEASE SCHEDULE OFFICE VISIT BEFORE ANY FUTURE REFILLS. 20.1 each 3   aspirin 81 MG tablet Take 81 mg by mouth daily.     b complex vitamins capsule Take 1 capsule by mouth once a week.     Biotin  5 MG CAPS Take 1 capsule (5 mg total) by mouth daily. (Patient taking differently: Take 1 capsule by mouth every other day.)     Cholecalciferol  1.25 MG (50000 UT) TABS Take 1 tablet by mouth once a week. 12 tablet 4   methocarbamol  (ROBAXIN ) 500 MG tablet TAKE 1-2 TABLETS (500-1,000 MG TOTAL) BY MOUTH 2 (TWO) TIMES DAILY AS NEEDED FOR MUSCLE SPASMS. 60 tablet 0   Multiple Vitamin (MULTIVITAMIN) tablet Take 1 tablet by mouth daily.     naproxen  (EC NAPROSYN ) 500 MG EC tablet Take 500 mg by mouth 2 (two) times daily with a meal.     naproxen  (NAPROSYN ) 500 MG tablet TAKE 1 TABLET (500 MG TOTAL) BY MOUTH DAILY AS NEEDED FOR MODERATE PAIN. 30 tablet 5   pantoprazole  (PROTONIX ) 40 MG tablet Take 1 tablet (40 mg total) by mouth daily. 30 tablet 6   rosuvastatin  (CRESTOR ) 10 MG tablet Take 1 tablet (10 mg total) by mouth every Monday, Wednesday, and Friday. 36 tablet  3   tirzepatide  (MOUNJARO ) 5 MG/0.5ML Pen Inject 5 mg into the skin once a week. 2 mL 1   No facility-administered medications prior to visit.     Per HPI unless specifically indicated in ROS section below Review of Systems  Objective:  There were no vitals taken for this visit.  Wt Readings from Last 3 Encounters:  06/24/24 236 lb 12.8 oz (107.4 kg)  05/10/24 243 lb 3.2 oz (110.3 kg)  05/03/24 242 lb (109.8 kg)      Physical Exam    Results for orders placed or performed in visit on 03/31/24  HM DIABETES EYE EXAM   Collection Time: 03/30/24  7:47 AM  Result Value Ref Range   HM Diabetic Eye Exam No Retinopathy No Retinopathy   Echocardiogram 03/2020 - EF  55-60%, no RWMA, normal LV systolic function, G1DD, trivial MR.   Assessment & Plan:   Problem List Items Addressed This Visit     Type 2 diabetes mellitus with other specified complication (HCC) - Primary     No orders of the defined types were placed in this encounter.   No orders of the defined types were placed in this encounter.   There are no Patient Instructions on file for this visit.  Follow up plan: No follow-ups on file.  Anton Blas, MD

## 2024-06-28 ENCOUNTER — Encounter: Payer: Self-pay | Admitting: Nurse Practitioner

## 2024-06-28 ENCOUNTER — Ambulatory Visit: Attending: Nurse Practitioner | Admitting: Nurse Practitioner

## 2024-06-28 VITALS — BP 112/89 | HR 75 | Temp 97.2°F | Resp 16 | Ht 62.0 in | Wt 234.0 lb

## 2024-06-28 DIAGNOSIS — E1169 Type 2 diabetes mellitus with other specified complication: Secondary | ICD-10-CM | POA: Diagnosis present

## 2024-06-28 DIAGNOSIS — G8929 Other chronic pain: Secondary | ICD-10-CM

## 2024-06-28 DIAGNOSIS — M48061 Spinal stenosis, lumbar region without neurogenic claudication: Secondary | ICD-10-CM | POA: Insufficient documentation

## 2024-06-28 DIAGNOSIS — M5416 Radiculopathy, lumbar region: Secondary | ICD-10-CM | POA: Diagnosis present

## 2024-06-28 DIAGNOSIS — M25531 Pain in right wrist: Secondary | ICD-10-CM | POA: Diagnosis present

## 2024-06-28 DIAGNOSIS — M2550 Pain in unspecified joint: Secondary | ICD-10-CM | POA: Diagnosis present

## 2024-06-28 MED ORDER — KETOROLAC TROMETHAMINE 30 MG/ML IJ SOLN
30.0000 mg | Freq: Once | INTRAMUSCULAR | Status: AC
  Administered 2024-06-28: 10:00:00 30 mg via INTRAMUSCULAR
  Filled 2024-06-28: qty 1

## 2024-06-28 MED ORDER — METHOCARBAMOL 1000 MG/10ML IJ SOLN
200.0000 mg | Freq: Once | INTRAMUSCULAR | Status: AC
  Administered 2024-06-28: 10:00:00 1000 mg via INTRAMUSCULAR
  Filled 2024-06-28: qty 10

## 2024-06-28 NOTE — Progress Notes (Signed)
 PROVIDER NOTE: Interpretation of information contained herein should be left to medically-trained personnel. Specific patient instructions are provided elsewhere under Patient Instructions section of medical record. This document was created in part using AI and STT-dictation technology, any transcriptional errors that may result from this process are unintentional.  Patient: Stacey Nichols  Service: E/M   PCP: Rilla Baller, MD  DOB: [birth year]  DOS: 06/28/2024  Provider: Emmy MARLA Blanch, NP  MRN: [id number]  Delivery: Face-to-face  Specialty: Interventional Pain Management  Type: Established Patient  Setting: Ambulatory outpatient facility  Specialty designation: 09  Referring Prov.: Rilla Baller, MD  Location: Outpatient office facility       History of present illness (HPI) [name], a 63 y.o. 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 [name] primary complain today is Back Pain (lower)  Pertinent problems: 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  Pain Assessment: Severity of Chronic pain is reported as a 7 /10. Location: Back Lower/left leg to the knee. Onset: More than a month ago. Quality: Other (Comment) (electric shock). Timing: Intermittent. Modifying factor(s): rest. Vitals:  height is 5' 2 (1.575 m) and weight is 234 lb (106.1 kg). Her temporal temperature is 97.2 F (36.2 C) (abnormal). Her blood pressure  is 112/89 and her pulse is 75. Her respiration is 16 and oxygen saturation is 96%.  BMI: Estimated body mass index is 42.8 kg/m as calculated from the following:   Height as of this encounter: 5' 2 (1.575 m).   Weight as of this encounter: 234 lb (106.1 kg).  Last encounter: Visit date not found. Last procedure: 05/03/2024  Reason for encounter: post-procedure evaluation and assessment.   Discussed the use of AI scribe software for clinical note transcription with the patient, who gave verbal consent to proceed.  History of Present Illness   Stacey Nichols is a 63 year old female with type 2 diabetes who presents with recurrent low back pain radiating to the left knee, more towards anterior aspect of left thigh.   She experiences low back pain radiating to the back of her left knee, described as a throbbing sensation in the front of her left leg up to the knee, rated as 7 to 8 out of 10. The pain occurs with movements such as sitting, moving in bed, or walking, and she is unable to walk even half a block without significant discomfort in her back and leg.  She has previously received lumbar transforaminal epidural injections, with the most recent one administered on May 03, 2024. The injection initially provided relief for approximately 2 months but the effect has since worn off, and her symptoms have returned.  Her current medications include Mounjaro  for diabetes and naproxen  for pain, taken every morning and night, though it provides minimal relief. She also takes Robaxin  500 mg every other day as prescribed by her primary care doctor.  She has a history of type 2 diabetes, with a recent hemoglobin A1c of 6.1, and is concerned about the impact of potential medications on her blood sugar  levels.     rocedure Procedure: Lumbar trans-foraminal epidural steroid injection (L-TFESI) #3  Laterality: Left (-LT)  Level: L3 nerve root(s) Imaging: Fluoroscopy-guided         Anesthesia:  Local anesthesia (1-2% Lidocaine ) Sedation: Minimal Sedation                       DOS: 05/03/2024  Performed by: Wallie Sherry, MD   Purpose: Diagnostic/Therapeutic Indications: Lumbar radicular pain severe enough to impact quality of life or function. 1. Lumbar radiculopathy (LEFT L3)   2. Chronic radicular lumbar pain     NAS-11 Pain score:        Pre-procedure: 9 /10        Post-procedure: 5 /10   Post-Procedure Evaluation    Effectiveness:  Initial hour after procedure: 50 % . Subsequent 4-6 hours post-procedure: 50 % . Analgesia past initial 6 hours: 80 % . Ongoing improvement:  Analgesic:  80% Function: [name] reports improvement in function ROM: [name] reports improvement in ROM Interpretation: [name] received a diagnostic/therapeutic lumbar transforaminal epidural steroid injection (L-TFESI) on May 03, 2024.  She reports initially 50% pain relief and functional improvement during local anesthetic phase, followed by ongoing 80% pain relief and functional improvement for approximately 2 months, and then pain returned to baseline.  Pharmacotherapy Assessment   Monitoring: [place] PMP: PDMP not reviewed this encounter.       Pharmacotherapy: No side-effects or adverse reactions reported. Compliance: No problems identified. Effectiveness: Clinically acceptable.  No notes on file  UDS:  No results found for: SUMMARY  No results found for: CBDTHCR No results found for: D8THCCBX No results found for: D9THCCBX  ROS  Constitutional: Denies any fever or chills Gastrointestinal: No reported hemesis, hematochezia, vomiting, or acute GI distress Musculoskeletal: Low back pain radiating to the anterior aspect of left thigh Neurological: No reported episodes of acute onset apraxia, aphasia, dysarthria, agnosia, amnesia, paralysis, loss of coordination, or loss of consciousness  Medication Review  Biotin , Cholecalciferol , acetaminophen , albuterol , aspirin, b  complex vitamins, methocarbamol , multivitamin, naproxen , pantoprazole , rosuvastatin , and tirzepatide   History Review  Allergy: [name] is allergic to latex, sulfa antibiotics, and tape. Drug: [name]  reports no history of drug use. Alcohol:  reports current alcohol use. Tobacco:  reports that she quit smoking about 6 years ago. Her smoking use included cigarettes. She started smoking about 36 years ago. She has never used smokeless tobacco. Social: [name]  reports that she quit smoking about 6 years ago. Her smoking use included cigarettes. She started smoking about 36 years ago. She has never used smokeless tobacco. She reports current alcohol use. She reports that she does not use drugs. Medical:  has a past medical history of AML (acute myeloblastic leukemia) (HCC) (2007), Anemia, Anxiety, Arthritis, Constipation, COVID-19 virus infection (07/04/2021), Depression, Diabetes mellitus without complication (HCC), Fatty liver, GERD (gastroesophageal reflux disease), H/O Bell's palsy, H/O hiatal hernia, Headache, High triglycerides, History of cancer chemotherapy, Leukemia (HCC), Morbidly obese (HCC), Osteopenia (06/08/2017), Pneumonia, Rheumatoid arthritis (HCC), Sleep apnea, and Stroke (HCC). Surgical: [name]  has a past surgical history that includes Laparoscopic gastric banding (2000); Tubal ligation (1996); Abdominal hysterectomy (2001); Upper gi endoscopy (N/A, 04/26/2014); Laparoscopic gastric band removal with laparoscopic gastric sleeve resection (04/26/2014); Blepharoplasty (Bilateral, 06/2020); Facial cosmetic surgery (06/2020); Colonoscopy with propofol  (N/A, 09/19/2020); Replacement total knee (Left, 10/2017); and Replacement total knee (Right, 06/2018). Family: family history includes CAD in her father; Cancer in her mother and  sister; Diabetes in her maternal grandfather; Heart Problems in her father; Hyperlipidemia in her father; Obesity in her father.  Laboratory Chemistry  Profile   Renal Lab Results  Component Value Date   BUN 17 03/26/2024   CREATININE 0.82 03/26/2024   BCR 27 (H) 09/04/2017   GFR 76.78 03/26/2024   GFRAA 115 09/04/2017   GFRNONAA >60 02/13/2022    Hepatic Lab Results  Component Value Date   AST 27 03/26/2024   ALT 30 03/26/2024   ALBUMIN 4.5 03/26/2024   ALKPHOS 60 03/26/2024   HCVAB NEGATIVE 12/25/2016    Electrolytes Lab Results  Component Value Date   NA 144 03/26/2024   Nichols 4.1 03/26/2024   CL 106 03/26/2024   CALCIUM  9.6 03/26/2024   MG 1.9 03/26/2024    Bone Lab Results  Component Value Date   VD25OH 30.42 09/24/2023    Inflammation (CRP: Acute Phase) (ESR: Chronic Phase) Lab Results  Component Value Date   CRP <1.0 05/21/2023   ESRSEDRATE 14 05/21/2023   LATICACIDVEN 1.8 02/13/2022         Note: Above Lab results reviewed.  Recent Imaging Review  DG PAIN CLINIC C-ARM 1-60 MIN NO REPORT Fluoro was used, but no Radiologist interpretation will be provided.  Please refer to NOTES tab for provider progress note. Note: Reviewed        Physical Exam  Vitals: BP 112/89 (Cuff Size: Large)   Pulse 75   Temp (!) 97.2 F (36.2 C) (Temporal)   Resp 16   Ht 5' 2 (1.575 m)   Wt 234 lb (106.1 kg)   SpO2 96%   BMI 42.80 kg/m  BMI: Estimated body mass index is 42.8 kg/m as calculated from the following:   Height as of this encounter: 5' 2 (1.575 m).   Weight as of this encounter: 234 lb (106.1 kg). Ideal: Ideal body weight: 50.1 kg (110 lb 7.2 oz) Adjusted ideal body weight: 72.5 kg (159 lb 13.9 oz) General appearance: Well nourished, well developed, and well hydrated. In no apparent acute distress Mental status: Alert, oriented x 3 (person, place, & time)       Respiratory: No evidence of acute respiratory distress Eyes: PERLA  Musculoskeletal: +LBP Assessment   Diagnosis Status  1. Lumbar foraminal stenosis   2. Chronic radicular lumbar pain   3. Right wrist pain   4. Polyarthralgia   5. Type  2 diabetes mellitus with other specified complication, unspecified whether long term insulin  use (HCC)    Controlled Controlled Controlled   Updated Problems: No problems updated.  Plan of Care  Problem-specific:  Assessment and Plan    Lumbar foraminal stenosis with chronic radicular pain Chronic radicular pain due to lumbar foraminal stenosis, primarily left-sided, with significant mobility impact. Previous epidural injection provided temporary relief. Borderline hemoglobin A1c limits prednisone  use. - Administered Toradol  IM injection for pain relief. - Administered Robaxin  IM injection for muscle relaxation. - Prescribed five-day course of Journavx for pain management. - Scheduled follow-up with Dr. Lateef for further evaluation or treatment recommendation.  - Advised to avoid Robaxin  on IM injection day. - Instructed to report Journavx effectiveness to front desk.       [name] has a current medication list which includes the following long-term medication(s): albuterol , pantoprazole , and rosuvastatin .  Pharmacotherapy (Medications Ordered): Meds ordered this encounter  Medications   ketorolac  (TORADOL ) 30 MG/ML injection 30 mg   methocarbamol  (ROBAXIN ) injection 200 mg   Orders:  No orders  of the defined types were placed in this encounter.       Return in about 3 weeks (around 07/19/2024) for (F2F), eval by MD (Dr. Marcelino).    Recent Visits Date Type Provider Dept  05/03/24 Procedure visit Marcelino Nurse, MD [facility]  Showing recent visits within past 90 days and meeting all other requirements Today's Visits Date Type Provider Dept  06/28/24 Office Visit Stacey Chisholm K, NP [facility]  Showing today's visits and meeting all other requirements Future Appointments Date Type Provider Dept  07/29/24 Appointment Marcelino Nurse, MD [facility]  Showing future appointments within next 90 days and meeting all other  requirements  I discussed the assessment and treatment plan with the patient. The patient was provided an opportunity to ask questions and all were answered. The patient agreed with the plan and demonstrated an understanding of the instructions.  Patient advised to call back or seek an in-person evaluation if the symptoms or condition worsens.  I personally spent a total of 20 minutes in the care of the patient today including preparing to see the patient, getting/reviewing separately obtained history, performing a medically appropriate exam/evaluation, counseling and educating, placing orders, referring and communicating with other health care professionals, documenting clinical information in the EHR, independently interpreting results, communicating results, and coordinating care.   Note by: Taylan Marez Nichols Inaaya Vellucci, NP (TTS and AI technology used. I apologize for any typographical errors that were not detected and corrected.) Date: 06/28/2024; Time: 10:56 AM

## 2024-06-29 ENCOUNTER — Ambulatory Visit: Admitting: Family Medicine

## 2024-06-29 ENCOUNTER — Encounter: Payer: Self-pay | Admitting: Family Medicine

## 2024-06-29 VITALS — BP 126/86 | HR 70 | Temp 98.1°F | Ht 62.0 in | Wt 236.8 lb

## 2024-06-29 DIAGNOSIS — G4733 Obstructive sleep apnea (adult) (pediatric): Secondary | ICD-10-CM | POA: Diagnosis not present

## 2024-06-29 DIAGNOSIS — G8929 Other chronic pain: Secondary | ICD-10-CM

## 2024-06-29 DIAGNOSIS — K760 Fatty (change of) liver, not elsewhere classified: Secondary | ICD-10-CM

## 2024-06-29 DIAGNOSIS — E1169 Type 2 diabetes mellitus with other specified complication: Secondary | ICD-10-CM | POA: Diagnosis not present

## 2024-06-29 DIAGNOSIS — E8881 Metabolic syndrome: Secondary | ICD-10-CM

## 2024-06-29 DIAGNOSIS — Z9884 Bariatric surgery status: Secondary | ICD-10-CM

## 2024-06-29 DIAGNOSIS — M5416 Radiculopathy, lumbar region: Secondary | ICD-10-CM | POA: Diagnosis not present

## 2024-06-29 DIAGNOSIS — G500 Trigeminal neuralgia: Secondary | ICD-10-CM

## 2024-06-29 LAB — POCT GLYCOSYLATED HEMOGLOBIN (HGB A1C): Hemoglobin A1C: 6 % — AB (ref 4.0–5.6)

## 2024-06-29 MED ORDER — TIRZEPATIDE 7.5 MG/0.5ML ~~LOC~~ SOAJ: 7.5000 mg | SUBCUTANEOUS | 3 refills | Status: AC

## 2024-06-29 NOTE — Assessment & Plan Note (Addendum)
 Recurrence of left facial spasms  in h/o [place] 2018 treated with tegretol. # provided to Dr Maree at [facility] who she previuosly saw for this (2018). Upcoming new insurance for 07/2024

## 2024-06-29 NOTE — Assessment & Plan Note (Addendum)
 H/o [place] lap band in [place] 2000s 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  Baseline BMI 45.61 (03/2024)  Congratulated on weight loss to date.  Continue Mounjaro  5mg  , will trial 7.5mg  dose, reviewing need to monitor for increased side effects.  Obesity complicated by comorbidities of OSA, DM, OA, dyslipidemia

## 2024-06-29 NOTE — Progress Notes (Signed)
 Ph: [phone] Fax: [phone]   Patient ID: Stacey Nichols, female    DOB: Nov 10, 1960, 63 y.o.   MRN: [id number]  This visit was conducted in person.  BP 126/86 (Cuff Size: Normal)   Pulse 70   Temp 98.1 F (36.7 C) (Oral)   Ht 5' 2 (1.575 m)   Wt 236 lb 12.8 oz (107.4 kg)   SpO2 94%   BMI 43.31 kg/m    CC: 3 mo DM/weight f/u visit  Subjective:   HPI: Stacey Nichols is a 63 y.o. female presenting on 06/29/2024 for Medical Management of Chronic Issues (Some constipation with monjuro)   Recent trip to [place] for the month of November.  Visited son in [place] for Thanksgiving - visited twin granddaughters.    Chronic low back pain - known right lateral listhesis L3/4 as well as far lateral disc herniation left L3/4 affecting left L3 nerve root and moderate central canal stenosis L3-L5. S/p 3rd L L3 TFESI 04/2024 ([place]). Physical therapy didn't help. She manages pain with tylenol  or naproxen  with limited benefit. Saw Dr Tobie yesterday - Rx toradol  and robaxin  IM followed by 5d Journavx course. Started magnesium for back pain as well. Ongoing pressure to lower back, progressing to pain with ambulation/activity.  Injections: Left L3 TF ESI 05/03/24 Left L3 TF ESI 02/25/24 Left L3 ESI on 01/21/24   OSA on CPAP managed by Dr Maree at [facility].    H/o swedish lap band in [place] 2000s 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  Baseline BMI 45.61 (03/2024)   Diabetes diagnosis based on 1 hour OGTT of 236 (10/2023). She also had random sugar 212 on CMP from 09/24/2023.   Starting weight 249lbs  BMI 45.6 (03/2024) Last weight: 249 Today's weight: 234 lbs  BMI 43.3 (06/2024)  On Mounjaro  5mg  weekly - notes some constipation and mild reflux managed with increased water, magnesium powder with benefit. No nausea. No epigastric pain.   DM - does regularly check sugars 90s, peak 110. Compliant with antihyperglycemic regimen which includes: metformin  500mg  daily and mounjaro  5mg   weekly. Denies low sugars or hypoglycemic symptoms. Denies paresthesias, blurry vision. Last diabetic eye exam 03/2024. Glucometer brand: unsure. Last foot exam: 03/2024. DSME: at [facility] - saw nutritionist last week.  Lab Results  Component Value Date   HGBA1C 6.0 (A) 06/29/2024   Diabetic Foot Exam - Simple   No data filed     OSA on CPAP - not regularly using.      Relevant past medical, surgical, family and social history reviewed and updated as indicated. Interim medical history since our last visit reviewed. Allergies and medications reviewed and updated. Outpatient Medications Prior to Visit  Medication Sig Dispense Refill   acetaminophen  (TYLENOL ) 500 MG tablet Take 500 mg by mouth every 6 (six) hours as needed.     albuterol  (VENTOLIN  HFA) 108 (90 Base) MCG/ACT inhaler INHALE 2 PUFFS INTO THE LUNGS EVERY 6 (SIX) HOURS AS NEEDED FOR WHEEZING OR SHORTNESS OF BREATH. PLEASE SCHEDULE OFFICE VISIT BEFORE ANY FUTURE REFILLS. 20.1 each 3   aspirin 81 MG tablet Take 81 mg by mouth daily.     b complex vitamins capsule Take 1 capsule by mouth once a week.     Biotin  5 MG CAPS Take 1 capsule (5 mg total) by mouth daily. (Patient taking differently: Take 1 capsule by mouth every other day.)     Cholecalciferol  1.25 MG (50000 UT) TABS Take 1 tablet by mouth once a week.  12 tablet 4   methocarbamol  (ROBAXIN ) 500 MG tablet TAKE 1-2 TABLETS (500-1,000 MG TOTAL) BY MOUTH 2 (TWO) TIMES DAILY AS NEEDED FOR MUSCLE SPASMS. 60 tablet 0   Multiple Vitamin (MULTIVITAMIN) tablet Take 1 tablet by mouth daily.     naproxen  (EC NAPROSYN ) 500 MG EC tablet Take 500 mg by mouth 2 (two) times daily with a meal.     naproxen  (NAPROSYN ) 500 MG tablet TAKE 1 TABLET (500 MG TOTAL) BY MOUTH DAILY AS NEEDED FOR MODERATE PAIN. 30 tablet 5   pantoprazole  (PROTONIX ) 40 MG tablet Take 1 tablet (40 mg total) by mouth daily. 30 tablet 6   rosuvastatin  (CRESTOR ) 10 MG tablet Take 1 tablet (10 mg total) by mouth every Monday,  Wednesday, and Friday. 36 tablet 3   tirzepatide  (MOUNJARO ) 5 MG/0.5ML Pen Inject 5 mg into the skin once a week. 2 mL 1   No facility-administered medications prior to visit.     Per HPI unless specifically indicated in ROS section below Review of Systems  Objective:  BP 126/86 (Cuff Size: Normal)   Pulse 70   Temp 98.1 F (36.7 C) (Oral)   Ht 5' 2 (1.575 m)   Wt 236 lb 12.8 oz (107.4 kg)   SpO2 94%   BMI 43.31 kg/m   Wt Readings from Last 3 Encounters:  06/29/24 236 lb 12.8 oz (107.4 kg)  06/28/24 234 lb (106.1 kg)  06/24/24 236 lb 12.8 oz (107.4 kg)      Physical Exam Vitals and nursing note reviewed.  Constitutional:      Appearance: Normal appearance. She is not ill-appearing.  HENT:     Head: Normocephalic and atraumatic.     Mouth/Throat:     Mouth: Mucous membranes are moist.     Pharynx: Oropharynx is clear. No oropharyngeal exudate or posterior oropharyngeal erythema.  Eyes:     Extraocular Movements: Extraocular movements intact.     Conjunctiva/sclera: Conjunctivae normal.     Pupils: Pupils are equal, round, and reactive to light.  Neck:     Thyroid : No thyroid  mass or thyromegaly.  Cardiovascular:     Rate and Rhythm: Normal rate and regular rhythm.     Pulses: Normal pulses.     Heart sounds: Normal heart sounds. No murmur heard. Pulmonary:     Effort: Pulmonary effort is normal. No respiratory distress.     Breath sounds: Normal breath sounds. No wheezing, rhonchi or rales.  Abdominal:     General: Bowel sounds are normal. There is no distension.     Palpations: Abdomen is soft. There is no mass.     Tenderness: There is abdominal tenderness (mild) in the left lower quadrant. There is no guarding or rebound. Negative signs include Murphy's sign.     Hernia: No hernia is present.  Musculoskeletal:     Cervical back: Normal range of motion and neck supple.     Right lower leg: No edema.     Left lower leg: No edema.     Comments:  See HPI for  foot exam if done  Skin:    General: Skin is warm and dry.     Findings: No rash.  Neurological:     Mental Status: She is alert.  Psychiatric:        Mood and Affect: Mood normal.        Behavior: Behavior normal.       Results for orders placed or performed in visit on 06/29/24  HgB  A1c   Collection Time: 06/29/24  8:51 AM  Result Value Ref Range   Hemoglobin A1C 6.0 (A) 4.0 - 5.6 %   HbA1c POC (<> result, manual entry)     HbA1c, POC (prediabetic range)     HbA1c, POC (controlled diabetic range)     Lab Results  Component Value Date   NA 144 03/26/2024   CL 106 03/26/2024   K 4.1 03/26/2024   CO2 30 03/26/2024   BUN 17 03/26/2024   CREATININE 0.82 03/26/2024   GFR 76.78 03/26/2024   CALCIUM  9.6 03/26/2024   ALBUMIN 4.5 03/26/2024   GLUCOSE 109 (H) 03/26/2024    Lab Results  Component Value Date   ALT 30 03/26/2024   AST 27 03/26/2024   ALKPHOS 60 03/26/2024   BILITOT 0.7 03/26/2024    Lab Results  Component Value Date   WBC 5.0 03/26/2024   HGB 14.5 03/26/2024   HCT 43.9 03/26/2024   MCV 97.5 03/26/2024   PLT 177.0 03/26/2024   Echocardiogram 03/2020 - EF 55-60%, no RWMA, normal LV systolic function, G1DD, trivial MR.   Assessment & Plan:   Problem List Items Addressed This Visit     History of laparoscopic adjustable gastric banding   Obesity, morbid, BMI 40.0-49.9 (HCC)   H/o swedish lap band in [place] 2000s 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  Baseline BMI 45.61 (03/2024)  Congratulated on weight loss to date.  Continue Mounjaro  5mg  , will trial 7.5mg  dose, reviewing need to monitor for increased side effects.  Obesity complicated by comorbidities of OSA, DM, OA, dyslipidemia       Relevant Medications   tirzepatide  (MOUNJARO ) 7.5 MG/0.5ML Pen   OSA (obstructive sleep apnea)   Previously saw Dr Maree neurology for OSA  Not recently using CPAP due to trouble with sleep.  Will re establish in the new year.       Type 2 diabetes mellitus  with other specified complication (HCC) - Primary   Chronic, great control on mounjaro  - continue, see below.  She saw nutritionist last week.  She has seen eye doctor 03/2024.  Diabetes associated with obesity, osteoarthritis.       Relevant Medications   tirzepatide  (MOUNJARO ) 7.5 MG/0.5ML Pen   Other Relevant Orders   HgB A1c (Completed)   Trigeminal neuralgia of left side of face   Recurrence of left facial spasms  in h/o [place] 2018 treated with tegretol. # provided to Dr [facility]aree at [facility] who she previuosly saw for this (2018). Upcoming new insurance for 07/2024      Metabolic dysfunction-associated steatotic liver disease (MASLD)   Discussed indeterminate fib4 score  Will order ultrasound liver elastography to [facility] [facility].    Fibrosis 4 Score = 1.73 (Indeterminate)      Interpretation for patients with HCV          <1.45       -  F0-F1 (Low risk)          1.45-3.25 -  Indeterminate           >3.25      -  F3-F4 (High risk)     Validated for ages 65-65      Relevant Orders   US  ELASTOGRAPHY LIVER   Insulin  resistance syndrome   Chronic radicular lumbar pain   Appreciate PM&R care - currently planned trial if Journavx        Meds ordered this encounter  Medications   tirzepatide  (MOUNJARO ) 7.5 MG/0.5ML Pen  Sig: Inject 7.5 mg into the skin once a week.    Dispense:  2 mL    Refill:  3    Orders Placed This Encounter  Procedures   US  ELASTOGRAPHY LIVER    Standing Status:   Future    Expiration Date:   06/29/2025    Reason for Exam (SYMPTOM  OR DIAGNOSIS REQUIRED):   h/o MASLD, indeterminate fib4 score    Preferred imaging location?:   [facility]   HgB A1c    Patient Instructions  Elihu dosis de mounjaro  a 7.5mg  semanalemente para el proximo refill.  Le he dado aplicacion para DMV handicap placard.   Puede llamar de nuevo a la clinica para hacer cita.  Hemang MARLA Fairly [address] [facility] [place]  [address] [phone]  Regresar en 3 meses para fisico  Follow up plan: Return in about 3 months (around 09/27/2024) for annual exam, prior fasting for blood work.  Anton Blas, MD

## 2024-06-29 NOTE — Assessment & Plan Note (Signed)
 Appreciate PM&R care - currently planned trial if Journavx

## 2024-06-29 NOTE — Patient Instructions (Addendum)
 Suba dosis de mounjaro  a 7.5mg  semanalemente para el proximo refill.  Le he dado aplicacion para DMV handicap placard.   Puede llamar de nuevo a la clinica para hacer cita.  Hemang MARLA Fairly [address] [facility] [place] [address] [phone]  Regresar en 3 meses para fisico

## 2024-06-29 NOTE — Assessment & Plan Note (Addendum)
 Chronic, great control on mounjaro  - continue, see below.  She saw nutritionist last week.  She has seen eye doctor 03/2024.  Diabetes associated with obesity, osteoarthritis.

## 2024-06-29 NOTE — Assessment & Plan Note (Addendum)
 Previously saw Dr Maree neurology for OSA  Not recently using CPAP due to trouble with sleep.  Will re establish in the new year.

## 2024-06-29 NOTE — Assessment & Plan Note (Signed)
 Discussed indeterminate fib4 score  Will order ultrasound liver elastography to [facility] [facility].    Fibrosis 4 Score = 1.73 (Indeterminate)      Interpretation for patients with HCV          <1.45       -  F0-F1 (Low risk)          1.45-3.25 -  Indeterminate           >3.25      -  F3-F4 (High risk)     Validated for ages 3-65

## 2024-07-20 ENCOUNTER — Other Ambulatory Visit (HOSPITAL_COMMUNITY): Payer: Self-pay

## 2024-07-20 ENCOUNTER — Telehealth: Payer: Self-pay

## 2024-07-20 NOTE — Telephone Encounter (Signed)
 Pharmacy Patient Advocate Encounter   Received notification from [facility] KEY that prior authorization for Mounjaro  7.5 is required/requested.   Insurance verification completed.   The patient is insured through [facility].   Per test claim: PA required; PA submitted to above mentioned insurance via Latent Key/confirmation #/EOC [id number] Status is pending

## 2024-07-23 ENCOUNTER — Other Ambulatory Visit (HOSPITAL_COMMUNITY): Payer: Self-pay

## 2024-07-23 NOTE — Telephone Encounter (Signed)
 Pharmacy Patient Advocate Encounter  Received notification from [facility] that Prior Authorization for Mounjaro  7.5 has been APPROVED from 07/22/24 to 07/22/25. Ran test claim, Copay is $25.00. This test claim was processed through [facility]- copay amounts may vary at other pharmacies due to pharmacy/plan contracts, or as the patient moves through the different stages of their insurance plan.   PA #/Case ID/Reference #: # [id number]

## 2024-07-23 NOTE — Telephone Encounter (Signed)
 Called and spoke with patient. Relayed information of approved medication. Pt verbalized understanding and has no question or concerns.

## 2024-07-29 ENCOUNTER — Other Ambulatory Visit: Payer: Self-pay | Admitting: Student in an Organized Health Care Education/Training Program

## 2024-07-29 ENCOUNTER — Encounter: Payer: Self-pay | Admitting: Student in an Organized Health Care Education/Training Program

## 2024-07-29 ENCOUNTER — Ambulatory Visit
Attending: Student in an Organized Health Care Education/Training Program | Admitting: Student in an Organized Health Care Education/Training Program

## 2024-07-29 VITALS — BP 123/87 | HR 80 | Temp 97.9°F | Resp 16 | Ht 62.0 in | Wt 230.0 lb

## 2024-07-29 DIAGNOSIS — M48061 Spinal stenosis, lumbar region without neurogenic claudication: Secondary | ICD-10-CM | POA: Insufficient documentation

## 2024-07-29 DIAGNOSIS — G8929 Other chronic pain: Secondary | ICD-10-CM

## 2024-07-29 DIAGNOSIS — M5416 Radiculopathy, lumbar region: Secondary | ICD-10-CM | POA: Diagnosis not present

## 2024-07-29 MED ORDER — NAPROXEN 500 MG PO TBEC: 500.0000 mg | DELAYED_RELEASE_TABLET | Freq: Two times a day (BID) | ORAL | 2 refills | Status: AC

## 2024-07-29 NOTE — Progress Notes (Signed)
 Safety precautions to be maintained throughout the outpatient stay will include: orient to surroundings, keep bed in low position, maintain call bell within reach at all times, provide assistance with transfer out of bed and ambulation.

## 2024-07-29 NOTE — Patient Instructions (Signed)

## 2024-07-29 NOTE — Progress Notes (Signed)
 PROVIDER NOTE: Interpretation of information contained herein should be left to medically-trained personnel. Specific patient instructions are provided elsewhere under Patient Instructions section of medical record. This document was created in part using AI and STT-dictation technology, any transcriptional errors that may result from this process are unintentional.  Patient: Stacey Nichols  Service: E/M   PCP: Rilla Baller, MD  DOB: 1961/06/23  DOS: 07/29/2024  Provider: Wallie Sherry, MD  MRN: [id number]  Delivery: Face-to-face  Specialty: Interventional Pain Management  Type: Established Patient  Setting: Ambulatory outpatient facility  Specialty designation: 09  Referring Prov.: Rilla Baller, MD  Location: Outpatient office facility       History of present illness (HPI) Stacey Nichols, a 64 y.o. 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 Stacey Nichols primary complain today is Back Pain (Bilateral )  Pertinent problems: Stacey Nichols does not have any pertinent problems on file.  Pain Assessment: Severity of Chronic pain is reported as a 4 /10. Location: Back Lower, Right, Left/With walking radaites from lower left back into left leg to left knee. Onset: More than a month ago. Quality: Constant, Sharp, Aching (Stiffness). Timing: Constant. Modifying factor(s): Lying, sitting, and resting. Vitals:  height is 5' 2 (1.575 m) and weight is 230 lb (104.3 kg). Her temporal temperature is 97.9 F (36.6 C). Her blood pressure is 123/87 and her pulse is 80. Her respiration is 16 and oxygen saturation is 96%.  BMI: Estimated body mass index is 42.07 kg/m as calculated from the following:   Height as of this encounter: 5' 2 (1.575 m).   Weight as of this encounter: 230 lb (104.3 kg).  Last encounter: 03/25/2024. Last procedure: 05/03/2024.  Reason for encounter:   History of Present Illness   Stacey Nichols is a 64 year old female who presents  increased low back and left leg pain.  She had a left L3 transforaminal ESI 05/03/2024.    She manages her pain with naproxen , taking it once daily after dinner, and increases to twice daily if the pain is severe. She previously tried Journavx, but it was not helpful.  She experiences frustration with her limited mobility and is considering increasing her physical activity by walking, despite potential pain.        ROS  Constitutional: Denies any fever or chills Gastrointestinal: No reported hemesis, hematochezia, vomiting, or acute GI distress Musculoskeletal: Denies any acute onset joint swelling, redness, loss of ROM, or weakness Neurological: Radiating left leg pain  Medication Review  Cholecalciferol , UNABLE TO FIND, acetaminophen , albuterol , aspirin, b complex vitamins, methocarbamol , multivitamin, naproxen , pantoprazole , rosuvastatin , and tirzepatide   History Review  Allergy: Stacey Nichols is allergic to latex, sulfa antibiotics, and tape. Drug: Stacey Nichols  reports no history of drug use. Alcohol:  reports current alcohol use. Tobacco:  reports that she quit smoking about 6 years ago. Her smoking use included cigarettes. She started smoking about 36 years ago. She has never used smokeless tobacco. Social: Stacey Nichols  reports that she quit smoking about 6 years ago. Her smoking use included cigarettes. She started smoking about 36 years ago. She has never used smokeless tobacco. She reports current alcohol use. She reports that she does not use drugs. Medical:  has a past medical history of AML (acute myeloblastic leukemia) (HCC) (2007), Anemia, Anxiety, Arthritis, Constipation, COVID-19 virus infection (07/04/2021), Depression, Diabetes mellitus without complication (HCC), Fatty liver, GERD (gastroesophageal reflux disease), H/O Bell's palsy, H/O hiatal hernia, Headache, High  triglycerides, History of cancer chemotherapy, Leukemia (HCC), Morbidly obese (HCC), Osteopenia (06/08/2017),  Pneumonia, Rheumatoid arthritis (HCC), Sleep apnea, and Stroke (HCC). Surgical: Stacey Nichols  has a past surgical history that includes Laparoscopic gastric banding (2000); Tubal ligation (1996); Abdominal hysterectomy (2001); Upper gi endoscopy (N/A, 04/26/2014); Laparoscopic gastric band removal with laparoscopic gastric sleeve resection (04/26/2014); Blepharoplasty (Bilateral, 06/2020); Facial cosmetic surgery (06/2020); Colonoscopy with propofol  (N/A, 09/19/2020); Replacement total knee (Left, 10/2017); and Replacement total knee (Right, 06/2018). Family: family history includes CAD in her father; Cancer in her mother and sister; Diabetes in her maternal grandfather; Heart Problems in her father; Hyperlipidemia in her father; Obesity in her father.  Laboratory Chemistry Profile   Renal Lab Results  Component Value Date   BUN 17 03/26/2024   CREATININE 0.82 03/26/2024   BCR 27 (H) 09/04/2017   GFR 76.78 03/26/2024   GFRAA 115 09/04/2017   GFRNONAA >60 02/13/2022    Hepatic Lab Results  Component Value Date   AST 27 03/26/2024   ALT 30 03/26/2024   ALBUMIN 4.5 03/26/2024   ALKPHOS 60 03/26/2024   HCVAB NEGATIVE 12/25/2016    Electrolytes Lab Results  Component Value Date   NA 144 03/26/2024   K 4.1 03/26/2024   CL 106 03/26/2024   CALCIUM  9.6 03/26/2024   MG 1.9 03/26/2024    Bone Lab Results  Component Value Date   VD25OH 30.42 09/24/2023    Inflammation (CRP: Acute Phase) (ESR: Chronic Phase) Lab Results  Component Value Date   CRP <1.0 05/21/2023   ESRSEDRATE 14 05/21/2023   LATICACIDVEN 1.8 02/13/2022         Note: Above Lab results reviewed.  Recent Imaging Review  CLINICAL DATA:  Lumbar radiculopathy with symptoms persisting over 6 weeks of treatment   EXAM: MRI LUMBAR SPINE WITHOUT CONTRAST   TECHNIQUE: Multiplanar, multisequence MR imaging of the lumbar spine was performed. No intravenous contrast was administered.   COMPARISON:  None Available.    FINDINGS: Segmentation:  Standard.   Alignment:  Trace scoliosis and mild L4-5 anterolisthesis   Vertebrae: No fracture, evidence of discitis, or bone lesion. Mild degenerative superior endplate edema at L4   Conus medullaris and cauda equina: Conus extends to the L1-2 level. Conus and cauda equina appear normal.   Paraspinal and other soft tissues: No perispinal mass or inflammation.   Disc levels:   T12- L1: Disc bulging and right facet spurring.   L1-L2: Circumferential disc bulging.  Negative facets   L2-L3: Disc bulging with narrowing and desiccation. Small central protrusion. Mild degenerative facet spurring.   L3-L4: Disc narrowing and bulging with moderate facet spurring. Degeneration and epidural fat expansion causes moderate thecal sac stenosis, 6 mm in the midline. Left foraminal to extraforaminal herniation contacting the left L3 nerve root.   L4-L5: Disc narrowing and bulging with tiny central protrusion. Degenerative facet spurring with ligamentum flavum thickening greater on the right. Epidural fat and degeneration causes moderate thecal sac stenosis. Patent foramina.   L5-S1:Disc desiccation and narrowing with small central protrusion. There is a right foraminal protrusion up lifting the L5 nerve root. Degenerative facet spurring greater on the right.   IMPRESSION: Generalized lumbar spine degeneration with scoliosis.   Focal foraminal impingement on the right at L5-S1 mainly due to disc protrusion.   On the symptomatic left side there is a L3-4 left far-lateral herniation which could affect the left L3 nerve root.   Degeneration and epidural fat causes moderate thecal sac stenosis at L3-4 and  L4-5.     Electronically Signed By: Dorn Roulette M.D. Note: Reviewed        Physical Exam  Vitals: BP 123/87 (Patient Position: Sitting, Cuff Size: Normal)   Pulse 80   Temp 97.9 F (36.6 C) (Temporal)   Resp 16   Ht 5' 2 (1.575 m)   Wt 230 lb  (104.3 kg)   SpO2 96%   BMI 42.07 kg/m  BMI: Estimated body mass index is 42.07 kg/m as calculated from the following:   Height as of this encounter: 5' 2 (1.575 m).   Weight as of this encounter: 230 lb (104.3 kg). Ideal: Ideal body weight: 50.1 kg (110 lb 7.2 oz) Adjusted ideal body weight: 71.8 kg (158 lb 4.3 oz) General appearance: Well nourished, well developed, and well hydrated. In no apparent acute distress Mental status: Alert, oriented x 3 (person, place, & time)       Respiratory: No evidence of acute respiratory distress Eyes: PERLA   Assessment   Diagnosis Status  1. Lumbar foraminal stenosis   2. Chronic radicular lumbar pain   3. Lumbar radiculopathy (LEFT L3)    Controlled Controlled Controlled   Updated Problems: No problems updated.  Plan of Care  Problem-specific:  Assessment and Plan    Lumbar radiculopathy   Chronic lumbar radiculopathy. She experiences significant immobility and is eager to increase physical activity. A previous trial of Journavx was ineffective. Current pain management includes naproxen  once daily after dinner, with an option to take it twice daily if pain is severe. Balancing diabetes management and pain control is crucial in determining the frequency of steroid injections. Recommend using an elliptical or foot bike for low-impact exercise to improve mobility and blood flow. Start with 10-12 minutes of leg exercises and 10 minutes of arm exercises using the foot bike. Add 5-pound weights for additional resistance during exercise. Continue naproxen  for pain management, with the option to take it twice daily if pain is severe. Schedule the next epidural steroid injection after January 20th, 2026, and administer Valium  orally prior to the injection.       Stacey Nichols has a current medication list which includes the following long-term medication(s): albuterol , pantoprazole , and rosuvastatin .  Pharmacotherapy (Medications  Ordered): Meds ordered this encounter  Medications   naproxen  (EC NAPROSYN ) 500 MG EC tablet    Sig: Take 1 tablet (500 mg total) by mouth 2 (two) times daily with a meal.    Dispense:  60 tablet    Refill:  2   Orders:  Orders Placed This Encounter  Procedures   Lumbar Transforaminal Epidural    Standing Status:   Future    Expiration Date:   10/27/2024    Scheduling Instructions:     Laterality: Left L3          Sedation: Patient's choice.     Timeframe: As soon as schedule allows.    Where will this procedure be performed?:   [facility]     Left L3 TF ESI 01/21/24, 02/25/24, 05/03/24   Return in about 6 weeks (around 09/08/2024) for Left L3 TF ESI PO Valium .    Recent Visits Date Type Provider Dept  06/28/24 Office Visit Patel, Seema K, NP [facility]  05/03/24 Procedure visit Marcelino Nurse, MD [facility]  Showing recent visits within past 90 days and meeting all other requirements Today's Visits Date Type Provider Dept  07/29/24 Office Visit Marcelino Nurse, MD [facility]  Showing today's visits and meeting all other requirements Future Appointments No visits were found meeting these conditions. Showing future appointments within next 90 days and meeting all other requirements  I discussed the assessment and treatment plan with the patient. The patient was provided an opportunity to ask questions and all were answered. The patient agreed with the plan and demonstrated an understanding of the instructions.  Patient advised to call back or seek an in-person evaluation if the symptoms or condition worsens.  I personally spent a total of 30 minutes in the care of the patient today including preparing to see the patient, getting/reviewing separately obtained history, performing a medically appropriate exam/evaluation, counseling and educating, placing orders, and documenting clinical information in the EHR.   Note by: Wallie Sherry, MD  (TTS and AI technology used. I apologize for any typographical errors that were not detected and corrected.) Date: 07/29/2024; Time: 12:03 PM

## 2024-08-02 ENCOUNTER — Telehealth: Payer: Self-pay

## 2024-08-02 NOTE — Telephone Encounter (Signed)
 Called patient left message

## 2024-08-02 NOTE — Telephone Encounter (Signed)
 Spoke to patient; Dr. Charolotte office reschedule her injection for 09/08/2024. She has requested to reschedule our appointment to end of March.

## 2024-08-02 NOTE — Telephone Encounter (Signed)
-----   Message from [facility] Anastasiya H sent at 08/02/2024 11:23 AM EST -----  ----- Message ----- From: Hilma Hastings, PA-C Sent: 08/02/2024   9:35 AM EST To: Don Girard GAILS, CMA  She has appt with me on Monday 1/26.   She saw Lateef last week and is being scheduled for repeat ESI sometime after 08/03/24.   I recommend she get that ESI and then see me 3-4 weeks after the injection.   Thanks!

## 2024-08-09 ENCOUNTER — Ambulatory Visit: Admitting: Orthopedic Surgery

## 2024-08-12 ENCOUNTER — Encounter: Attending: Family Medicine | Admitting: Dietician

## 2024-08-12 ENCOUNTER — Encounter: Payer: Self-pay | Admitting: Dietician

## 2024-08-12 VITALS — Ht 62.0 in | Wt 232.4 lb

## 2024-08-12 DIAGNOSIS — E1169 Type 2 diabetes mellitus with other specified complication: Secondary | ICD-10-CM | POA: Insufficient documentation

## 2024-08-12 NOTE — Patient Instructions (Addendum)
 Congratulations on your improved A1c and weight loss!  Continue to eat 4-6 oz of protein at every meal! As your appetite decreases, you may have to lower you portion to 3-4 oz to be able to eat anything else. This will help to ensure you can get your fiber from vegetables with your meals.  Try to consume between 60-80g of Protein daily.  Add in high protein snacks like low-fat cottage cheese, greek yogurt, with fruit.  Continue to look for weight loss of 2-4 pounds in a month!

## 2024-08-12 NOTE — Progress Notes (Signed)
 Diabetes Self-Management Education  Visit Type: Follow-up  Appt. Start Time: 1000 Appt. End Time: 1045  08/12/2024  [name], identified by name and date of birth, is a 64 y.o. female with a diagnosis of Diabetes:  .   ASSESSMENT Height 5' 2 (1.575 m), weight 232 lb 6.4 oz (105.4 kg). Body mass index is 42.51 kg/m.  Spanish interpreter, [name], present for appointment Pt reports increasing Mounjaro  @7 .5mg , no new side effects, reports continuing constipation that is well managed with milk of magnesia. Pt reports increased suppression of appetite and early satiety. Pt reports starting a garlic and ginger supplement to aid in glycemic control. Pt reports continuing to work on eating more healthy, eating more greens, veggies and trying to control protein portions (4-6 oz PRO per meal). Pt reports doing more exercise with legs, walking up and down stair stepper, continuing seated exercises. Pt reports lingering back pain after ~10 minutes of activity.   Diabetes Self-Management Education - 08/12/24 1314       Visit Information   Visit Type Follow-up      Psychosocial Assessment   What is the hardest part about your diabetes right now, causing you the most concern, or is the most worrisome to you about your diabetes?   Being active    Other persons present Patient;Interpreter    Patient Concerns Weight Control;Healthy Lifestyle    Special Needs Other (comment)   Spanish materials   Preferred Learning Style Auditory      Pre-Education Assessment   Patient understands the diabetes disease and treatment process. Comprehends key points    Patient understands incorporating nutritional management into lifestyle. Comprehends key points    Patient undertands incorporating physical activity into lifestyle. Comprehends key points    Patient understands using medications safely. Needs Review    Patient understands monitoring blood glucose, interpreting and using results Needs  Review    Patient understands prevention, detection, and treatment of acute complications. Needs Review    Patient understands prevention, detection, and treatment of chronic complications. Compreheands key points    Patient understands how to develop strategies to address psychosocial issues. Comprehends key points    Patient understands how to develop strategies to promote health/change behavior. Needs Review      Complications   Last HgB A1C per patient/outside source 6 %   06/29/2024   How often do you check your blood sugar? --   a few times monthly   Fasting Blood glucose range (mg/dL) 29-870      Dietary Intake   Breakfast 2 scrambled eggs, green beans, black coffee    Lunch Beef, 3/4 cup of cactus, 1/2 cup green beans    Dinner 2 cheese quesadillas on high fiber tortillas    Beverage(s) ICE water, Coffee, Water, Ginger or Mint tea      Activity / Exercise   Activity / Exercise Type ADL's;Light (walking / raking leaves)    How many days per week do you exercise? 7    How many minutes per day do you exercise? 10    Total minutes per week of exercise 70      Patient Education   Disease Pathophysiology Explored patient's options for treatment of their diabetes    Healthy Eating Food label reading, portion sizes and measuring food.    Being Active Helped patient identify appropriate exercises in relation to his/her diabetes, diabetes complications and other health issue.    Medications Reviewed patients medication for diabetes, action, purpose, timing of  dose and side effects.    Monitoring Identified appropriate SMBG and/or A1C goals.    Chronic complications Relationship between chronic complications and blood glucose control    Diabetes Stress and Support Identified and addressed patients feelings and concerns about diabetes      Individualized Goals (developed by patient)   Nutrition General guidelines for healthy choices and portions discussed    Physical Activity 15 minutes  per day    Medications take my medication as prescribed    Monitoring  Test my blood glucose as discussed    Reducing Risk examine blood glucose patterns      Patient Self-Evaluation of Goals - Patient rates self as meeting previously set goals (% of time)   Nutrition >75% (most of the time)    Physical Activity 25 - 50% (sometimes)    Medications >75% (most of the time)    Monitoring < 25% (hardly ever/never)    Problem Solving and behavior change strategies  25 - 50% (sometimes)    Reducing Risk (treating acute and chronic complications) 50 - 75 % (half of the time)    Health Coping 25 - 50% (sometimes)      Post-Education Assessment   Patient understands the diabetes disease and treatment process. Comprehends key points    Patient understands incorporating nutritional management into lifestyle. Comprehends key points    Patient undertands incorporating physical activity into lifestyle. Comprehends key points    Patient understands using medications safely. Comphrehends key points    Patient understands monitoring blood glucose, interpreting and using results Needs Review    Patient understands prevention, detection, and treatment of acute complications. Comprehends key points    Patient understands prevention, detection, and treatment of chronic complications. Comprehends key points    Patient understands how to develop strategies to address psychosocial issues. Needs Review    Patient understands how to develop strategies to promote health/change behavior. Comprehends key points      Outcomes   Expected Outcomes Demonstrated interest in learning. Expect positive outcomes    Future DMSE 3-4 months    Program Status Not Completed      Subsequent Visit   Since your last visit have you continued or begun to take your medications as prescribed? Yes    Since your last visit have you had your blood pressure checked? Yes    Is your most recent blood pressure lower, unchanged, or higher  since your last visit? Lower    Since your last visit have you experienced any weight changes? Loss    Weight Loss (lbs) 4    Since your last visit, are you checking your blood glucose at least once a day? No   Every week or 2         Individualized Plan for Diabetes Self-Management Training:   Learning Objective:  Patient will have a greater understanding of diabetes self-management. Patient education plan is to attend individual and/or group sessions per assessed needs and concerns.   Plan:   Patient Instructions  Congratulations on your improved A1c and weight loss!  Continue to eat 4-6 oz of protein at every meal! As your appetite decreases, you may have to lower you portion to 3-4 oz to be able to eat anything else. This will help to ensure you can get your fiber from vegetables with your meals.  Try to consume between 60-80g of Protein daily.  Add in high protein snacks like low-fat cottage cheese, greek yogurt, with fruit.  Continue to look  for weight loss of 2-4 pounds in a month!   Expected Outcomes:  Demonstrated interest in learning. Expect positive outcomes  If problems or questions, patient to contact team via:  Phone and Email  Future DSME appointment: 3-4 months

## 2024-09-08 ENCOUNTER — Ambulatory Visit: Admitting: Student in an Organized Health Care Education/Training Program

## 2024-09-28 ENCOUNTER — Other Ambulatory Visit

## 2024-10-05 ENCOUNTER — Encounter: Admitting: Family Medicine

## 2024-10-11 ENCOUNTER — Ambulatory Visit: Admitting: Orthopedic Surgery

## 2024-11-11 ENCOUNTER — Encounter: Admitting: Dietician
